# Patient Record
Sex: Female | Born: 1935
Health system: Southern US, Community
[De-identification: ages and names within clinical notes are randomized; demographics above are authoritative.]

## PROBLEM LIST (undated history)

## (undated) DIAGNOSIS — M1990 Unspecified osteoarthritis, unspecified site: Secondary | ICD-10-CM

## (undated) DIAGNOSIS — L570 Actinic keratosis: Secondary | ICD-10-CM

## (undated) DIAGNOSIS — R410 Disorientation, unspecified: Secondary | ICD-10-CM

## (undated) DIAGNOSIS — I10 Essential (primary) hypertension: Secondary | ICD-10-CM

## (undated) DIAGNOSIS — J439 Emphysema, unspecified: Secondary | ICD-10-CM

## (undated) DIAGNOSIS — R0989 Other specified symptoms and signs involving the circulatory and respiratory systems: Secondary | ICD-10-CM

## (undated) DIAGNOSIS — R413 Other amnesia: Secondary | ICD-10-CM

## (undated) DIAGNOSIS — H409 Unspecified glaucoma: Secondary | ICD-10-CM

## (undated) DIAGNOSIS — E039 Hypothyroidism, unspecified: Secondary | ICD-10-CM

## (undated) DIAGNOSIS — E871 Hypo-osmolality and hyponatremia: Secondary | ICD-10-CM

## (undated) DIAGNOSIS — R251 Tremor, unspecified: Secondary | ICD-10-CM

## (undated) HISTORY — DX: Disorientation, unspecified: R41.0

## (undated) HISTORY — DX: Other amnesia: R41.3

## (undated) HISTORY — PX: BREAST BIOPSY: SHX20

## (undated) HISTORY — DX: Hypo-osmolality and hyponatremia: E87.1

## (undated) HISTORY — DX: Unspecified glaucoma: H40.9

## (undated) HISTORY — DX: Hypothyroidism, unspecified: E03.9

## (undated) HISTORY — DX: Essential (primary) hypertension: I10

## (undated) HISTORY — DX: Emphysema, unspecified: J43.9

## (undated) HISTORY — DX: Tremor, unspecified: R25.1

## (undated) HISTORY — DX: Other specified symptoms and signs involving the circulatory and respiratory systems: R09.89

## (undated) HISTORY — DX: Actinic keratosis: L57.0

## (undated) HISTORY — DX: Unspecified osteoarthritis, unspecified site: M19.90

---

## 2014-07-21 DIAGNOSIS — I10 Essential (primary) hypertension: Secondary | ICD-10-CM | POA: Diagnosis not present

## 2014-07-21 DIAGNOSIS — E039 Hypothyroidism, unspecified: Secondary | ICD-10-CM | POA: Diagnosis not present

## 2014-07-21 DIAGNOSIS — E785 Hyperlipidemia, unspecified: Secondary | ICD-10-CM | POA: Diagnosis not present

## 2014-07-21 DIAGNOSIS — E559 Vitamin D deficiency, unspecified: Secondary | ICD-10-CM | POA: Diagnosis not present

## 2014-09-08 DIAGNOSIS — H4011X0 Primary open-angle glaucoma, stage unspecified: Secondary | ICD-10-CM | POA: Diagnosis not present

## 2014-11-24 DIAGNOSIS — E559 Vitamin D deficiency, unspecified: Secondary | ICD-10-CM | POA: Diagnosis not present

## 2014-11-24 DIAGNOSIS — I10 Essential (primary) hypertension: Secondary | ICD-10-CM | POA: Diagnosis not present

## 2014-11-24 DIAGNOSIS — E785 Hyperlipidemia, unspecified: Secondary | ICD-10-CM | POA: Diagnosis not present

## 2014-12-01 DIAGNOSIS — E559 Vitamin D deficiency, unspecified: Secondary | ICD-10-CM | POA: Diagnosis not present

## 2014-12-01 DIAGNOSIS — D649 Anemia, unspecified: Secondary | ICD-10-CM | POA: Diagnosis not present

## 2014-12-01 DIAGNOSIS — L989 Disorder of the skin and subcutaneous tissue, unspecified: Secondary | ICD-10-CM | POA: Diagnosis not present

## 2014-12-01 DIAGNOSIS — E784 Other hyperlipidemia: Secondary | ICD-10-CM | POA: Diagnosis not present

## 2014-12-01 DIAGNOSIS — D539 Nutritional anemia, unspecified: Secondary | ICD-10-CM | POA: Diagnosis not present

## 2014-12-01 DIAGNOSIS — I10 Essential (primary) hypertension: Secondary | ICD-10-CM | POA: Diagnosis not present

## 2014-12-18 DIAGNOSIS — D485 Neoplasm of uncertain behavior of skin: Secondary | ICD-10-CM | POA: Diagnosis not present

## 2014-12-18 DIAGNOSIS — C44629 Squamous cell carcinoma of skin of left upper limb, including shoulder: Secondary | ICD-10-CM | POA: Diagnosis not present

## 2014-12-18 DIAGNOSIS — C44329 Squamous cell carcinoma of skin of other parts of face: Secondary | ICD-10-CM | POA: Diagnosis not present

## 2014-12-18 DIAGNOSIS — D692 Other nonthrombocytopenic purpura: Secondary | ICD-10-CM | POA: Diagnosis not present

## 2014-12-18 DIAGNOSIS — L570 Actinic keratosis: Secondary | ICD-10-CM | POA: Diagnosis not present

## 2015-01-04 DIAGNOSIS — Z1231 Encounter for screening mammogram for malignant neoplasm of breast: Secondary | ICD-10-CM | POA: Diagnosis not present

## 2015-01-04 DIAGNOSIS — Z9289 Personal history of other medical treatment: Secondary | ICD-10-CM | POA: Diagnosis not present

## 2015-01-04 DIAGNOSIS — D649 Anemia, unspecified: Secondary | ICD-10-CM | POA: Diagnosis not present

## 2015-01-27 DIAGNOSIS — C44329 Squamous cell carcinoma of skin of other parts of face: Secondary | ICD-10-CM | POA: Diagnosis not present

## 2015-02-02 DIAGNOSIS — S37019D Minor contusion of unspecified kidney, subsequent encounter: Secondary | ICD-10-CM | POA: Diagnosis not present

## 2015-02-02 DIAGNOSIS — N390 Urinary tract infection, site not specified: Secondary | ICD-10-CM | POA: Diagnosis not present

## 2015-02-02 DIAGNOSIS — N189 Chronic kidney disease, unspecified: Secondary | ICD-10-CM | POA: Diagnosis not present

## 2015-02-02 DIAGNOSIS — N183 Chronic kidney disease, stage 3 (moderate): Secondary | ICD-10-CM | POA: Diagnosis not present

## 2015-02-02 DIAGNOSIS — N2889 Other specified disorders of kidney and ureter: Secondary | ICD-10-CM | POA: Diagnosis not present

## 2015-02-09 DIAGNOSIS — C44629 Squamous cell carcinoma of skin of left upper limb, including shoulder: Secondary | ICD-10-CM | POA: Diagnosis not present

## 2015-02-15 DIAGNOSIS — C44629 Squamous cell carcinoma of skin of left upper limb, including shoulder: Secondary | ICD-10-CM | POA: Diagnosis not present

## 2015-02-15 DIAGNOSIS — D0462 Carcinoma in situ of skin of left upper limb, including shoulder: Secondary | ICD-10-CM | POA: Diagnosis not present

## 2015-02-24 DIAGNOSIS — C44329 Squamous cell carcinoma of skin of other parts of face: Secondary | ICD-10-CM | POA: Diagnosis not present

## 2015-03-11 DIAGNOSIS — H2510 Age-related nuclear cataract, unspecified eye: Secondary | ICD-10-CM | POA: Diagnosis not present

## 2015-04-12 DIAGNOSIS — Z23 Encounter for immunization: Secondary | ICD-10-CM | POA: Diagnosis not present

## 2015-04-12 DIAGNOSIS — E559 Vitamin D deficiency, unspecified: Secondary | ICD-10-CM | POA: Diagnosis not present

## 2015-04-12 DIAGNOSIS — E039 Hypothyroidism, unspecified: Secondary | ICD-10-CM | POA: Diagnosis not present

## 2015-04-12 DIAGNOSIS — I10 Essential (primary) hypertension: Secondary | ICD-10-CM | POA: Diagnosis not present

## 2015-04-12 DIAGNOSIS — E785 Hyperlipidemia, unspecified: Secondary | ICD-10-CM | POA: Diagnosis not present

## 2015-04-27 DIAGNOSIS — E871 Hypo-osmolality and hyponatremia: Secondary | ICD-10-CM | POA: Diagnosis not present

## 2015-05-21 DIAGNOSIS — S29012A Strain of muscle and tendon of back wall of thorax, initial encounter: Secondary | ICD-10-CM | POA: Diagnosis not present

## 2015-05-21 DIAGNOSIS — S39012A Strain of muscle, fascia and tendon of lower back, initial encounter: Secondary | ICD-10-CM | POA: Diagnosis not present

## 2015-05-21 DIAGNOSIS — S336XXA Sprain of sacroiliac joint, initial encounter: Secondary | ICD-10-CM | POA: Diagnosis not present

## 2015-05-21 DIAGNOSIS — M9903 Segmental and somatic dysfunction of lumbar region: Secondary | ICD-10-CM | POA: Diagnosis not present

## 2015-05-24 DIAGNOSIS — S336XXA Sprain of sacroiliac joint, initial encounter: Secondary | ICD-10-CM | POA: Diagnosis not present

## 2015-05-24 DIAGNOSIS — S29012A Strain of muscle and tendon of back wall of thorax, initial encounter: Secondary | ICD-10-CM | POA: Diagnosis not present

## 2015-05-24 DIAGNOSIS — M9903 Segmental and somatic dysfunction of lumbar region: Secondary | ICD-10-CM | POA: Diagnosis not present

## 2015-05-24 DIAGNOSIS — S39012A Strain of muscle, fascia and tendon of lower back, initial encounter: Secondary | ICD-10-CM | POA: Diagnosis not present

## 2015-05-26 DIAGNOSIS — S39012A Strain of muscle, fascia and tendon of lower back, initial encounter: Secondary | ICD-10-CM | POA: Diagnosis not present

## 2015-05-26 DIAGNOSIS — S29012A Strain of muscle and tendon of back wall of thorax, initial encounter: Secondary | ICD-10-CM | POA: Diagnosis not present

## 2015-05-26 DIAGNOSIS — S336XXA Sprain of sacroiliac joint, initial encounter: Secondary | ICD-10-CM | POA: Diagnosis not present

## 2015-05-26 DIAGNOSIS — M9903 Segmental and somatic dysfunction of lumbar region: Secondary | ICD-10-CM | POA: Diagnosis not present

## 2015-05-28 DIAGNOSIS — S39012A Strain of muscle, fascia and tendon of lower back, initial encounter: Secondary | ICD-10-CM | POA: Diagnosis not present

## 2015-05-28 DIAGNOSIS — S29012A Strain of muscle and tendon of back wall of thorax, initial encounter: Secondary | ICD-10-CM | POA: Diagnosis not present

## 2015-05-28 DIAGNOSIS — M9903 Segmental and somatic dysfunction of lumbar region: Secondary | ICD-10-CM | POA: Diagnosis not present

## 2015-05-28 DIAGNOSIS — S336XXA Sprain of sacroiliac joint, initial encounter: Secondary | ICD-10-CM | POA: Diagnosis not present

## 2015-05-29 DIAGNOSIS — E871 Hypo-osmolality and hyponatremia: Secondary | ICD-10-CM | POA: Diagnosis not present

## 2015-05-29 DIAGNOSIS — B9620 Unspecified Escherichia coli [E. coli] as the cause of diseases classified elsewhere: Secondary | ICD-10-CM | POA: Diagnosis not present

## 2015-05-29 DIAGNOSIS — S0990XA Unspecified injury of head, initial encounter: Secondary | ICD-10-CM | POA: Diagnosis not present

## 2015-05-29 DIAGNOSIS — F0150 Vascular dementia without behavioral disturbance: Secondary | ICD-10-CM | POA: Diagnosis not present

## 2015-05-29 DIAGNOSIS — R296 Repeated falls: Secondary | ICD-10-CM | POA: Diagnosis not present

## 2015-05-29 DIAGNOSIS — F0390 Unspecified dementia without behavioral disturbance: Secondary | ICD-10-CM | POA: Diagnosis present

## 2015-05-29 DIAGNOSIS — E039 Hypothyroidism, unspecified: Secondary | ICD-10-CM | POA: Diagnosis not present

## 2015-05-29 DIAGNOSIS — R451 Restlessness and agitation: Secondary | ICD-10-CM | POA: Diagnosis not present

## 2015-05-29 DIAGNOSIS — S79912A Unspecified injury of left hip, initial encounter: Secondary | ICD-10-CM | POA: Diagnosis not present

## 2015-05-29 DIAGNOSIS — F0280 Dementia in other diseases classified elsewhere without behavioral disturbance: Secondary | ICD-10-CM | POA: Diagnosis not present

## 2015-05-29 DIAGNOSIS — R278 Other lack of coordination: Secondary | ICD-10-CM | POA: Diagnosis not present

## 2015-05-29 DIAGNOSIS — R54 Age-related physical debility: Secondary | ICD-10-CM | POA: Diagnosis not present

## 2015-05-29 DIAGNOSIS — I110 Hypertensive heart disease with heart failure: Secondary | ICD-10-CM | POA: Diagnosis not present

## 2015-05-29 DIAGNOSIS — S79911A Unspecified injury of right hip, initial encounter: Secondary | ICD-10-CM | POA: Diagnosis not present

## 2015-05-29 DIAGNOSIS — N390 Urinary tract infection, site not specified: Secondary | ICD-10-CM | POA: Diagnosis not present

## 2015-05-29 DIAGNOSIS — R5381 Other malaise: Secondary | ICD-10-CM | POA: Diagnosis not present

## 2015-05-29 DIAGNOSIS — S3993XA Unspecified injury of pelvis, initial encounter: Secondary | ICD-10-CM | POA: Diagnosis not present

## 2015-05-29 DIAGNOSIS — M79606 Pain in leg, unspecified: Secondary | ICD-10-CM | POA: Diagnosis not present

## 2015-05-29 DIAGNOSIS — M6281 Muscle weakness (generalized): Secondary | ICD-10-CM | POA: Diagnosis not present

## 2015-05-29 DIAGNOSIS — T148 Other injury of unspecified body region: Secondary | ICD-10-CM | POA: Diagnosis not present

## 2015-05-29 DIAGNOSIS — I10 Essential (primary) hypertension: Secondary | ICD-10-CM | POA: Diagnosis not present

## 2015-06-02 DIAGNOSIS — R5381 Other malaise: Secondary | ICD-10-CM | POA: Diagnosis not present

## 2015-06-02 DIAGNOSIS — S79911A Unspecified injury of right hip, initial encounter: Secondary | ICD-10-CM | POA: Diagnosis not present

## 2015-06-02 DIAGNOSIS — F0390 Unspecified dementia without behavioral disturbance: Secondary | ICD-10-CM | POA: Diagnosis not present

## 2015-06-02 DIAGNOSIS — R296 Repeated falls: Secondary | ICD-10-CM | POA: Diagnosis not present

## 2015-06-02 DIAGNOSIS — N390 Urinary tract infection, site not specified: Secondary | ICD-10-CM | POA: Diagnosis not present

## 2015-06-02 DIAGNOSIS — S79912A Unspecified injury of left hip, initial encounter: Secondary | ICD-10-CM | POA: Diagnosis not present

## 2015-06-02 DIAGNOSIS — R451 Restlessness and agitation: Secondary | ICD-10-CM | POA: Diagnosis not present

## 2015-06-02 DIAGNOSIS — B9620 Unspecified Escherichia coli [E. coli] as the cause of diseases classified elsewhere: Secondary | ICD-10-CM | POA: Diagnosis not present

## 2015-06-02 DIAGNOSIS — R278 Other lack of coordination: Secondary | ICD-10-CM | POA: Diagnosis not present

## 2015-06-02 DIAGNOSIS — E871 Hypo-osmolality and hyponatremia: Secondary | ICD-10-CM | POA: Diagnosis not present

## 2015-06-02 DIAGNOSIS — M6281 Muscle weakness (generalized): Secondary | ICD-10-CM | POA: Diagnosis not present

## 2015-06-02 DIAGNOSIS — R54 Age-related physical debility: Secondary | ICD-10-CM | POA: Diagnosis not present

## 2015-06-02 DIAGNOSIS — I10 Essential (primary) hypertension: Secondary | ICD-10-CM | POA: Diagnosis not present

## 2015-06-02 DIAGNOSIS — F0280 Dementia in other diseases classified elsewhere without behavioral disturbance: Secondary | ICD-10-CM | POA: Diagnosis not present

## 2015-06-02 DIAGNOSIS — E039 Hypothyroidism, unspecified: Secondary | ICD-10-CM | POA: Diagnosis not present

## 2015-06-07 DIAGNOSIS — N390 Urinary tract infection, site not specified: Secondary | ICD-10-CM | POA: Diagnosis not present

## 2015-06-07 DIAGNOSIS — I10 Essential (primary) hypertension: Secondary | ICD-10-CM | POA: Diagnosis not present

## 2015-06-07 DIAGNOSIS — E871 Hypo-osmolality and hyponatremia: Secondary | ICD-10-CM | POA: Diagnosis not present

## 2015-06-07 DIAGNOSIS — R5381 Other malaise: Secondary | ICD-10-CM | POA: Diagnosis not present

## 2015-06-20 DIAGNOSIS — I10 Essential (primary) hypertension: Secondary | ICD-10-CM | POA: Diagnosis not present

## 2015-06-20 DIAGNOSIS — E871 Hypo-osmolality and hyponatremia: Secondary | ICD-10-CM | POA: Diagnosis not present

## 2015-06-20 DIAGNOSIS — E039 Hypothyroidism, unspecified: Secondary | ICD-10-CM | POA: Diagnosis not present

## 2015-06-20 DIAGNOSIS — M6281 Muscle weakness (generalized): Secondary | ICD-10-CM | POA: Diagnosis not present

## 2015-06-20 DIAGNOSIS — N390 Urinary tract infection, site not specified: Secondary | ICD-10-CM | POA: Diagnosis not present

## 2015-06-20 DIAGNOSIS — R54 Age-related physical debility: Secondary | ICD-10-CM | POA: Diagnosis not present

## 2015-06-20 DIAGNOSIS — R5381 Other malaise: Secondary | ICD-10-CM | POA: Diagnosis not present

## 2015-06-20 DIAGNOSIS — R451 Restlessness and agitation: Secondary | ICD-10-CM | POA: Diagnosis not present

## 2015-06-20 DIAGNOSIS — R296 Repeated falls: Secondary | ICD-10-CM | POA: Diagnosis not present

## 2015-06-20 DIAGNOSIS — F0390 Unspecified dementia without behavioral disturbance: Secondary | ICD-10-CM | POA: Diagnosis not present

## 2015-06-20 DIAGNOSIS — R278 Other lack of coordination: Secondary | ICD-10-CM | POA: Diagnosis not present

## 2015-06-20 DIAGNOSIS — F0280 Dementia in other diseases classified elsewhere without behavioral disturbance: Secondary | ICD-10-CM | POA: Diagnosis not present

## 2015-06-23 DIAGNOSIS — F0390 Unspecified dementia without behavioral disturbance: Secondary | ICD-10-CM | POA: Diagnosis not present

## 2015-06-23 DIAGNOSIS — M6281 Muscle weakness (generalized): Secondary | ICD-10-CM | POA: Diagnosis not present

## 2015-06-23 DIAGNOSIS — I10 Essential (primary) hypertension: Secondary | ICD-10-CM | POA: Diagnosis not present

## 2015-06-23 DIAGNOSIS — Z9181 History of falling: Secondary | ICD-10-CM | POA: Diagnosis not present

## 2015-06-23 DIAGNOSIS — Z87440 Personal history of urinary (tract) infections: Secondary | ICD-10-CM | POA: Diagnosis not present

## 2015-06-23 DIAGNOSIS — S90922D Unspecified superficial injury of left foot, subsequent encounter: Secondary | ICD-10-CM | POA: Diagnosis not present

## 2015-06-24 ENCOUNTER — Telehealth: Payer: Self-pay | Admitting: Primary Care

## 2015-06-24 ENCOUNTER — Ambulatory Visit (INDEPENDENT_AMBULATORY_CARE_PROVIDER_SITE_OTHER): Payer: Medicare Other | Admitting: Primary Care

## 2015-06-24 ENCOUNTER — Encounter: Payer: Self-pay | Admitting: Primary Care

## 2015-06-24 VITALS — BP 136/76 | HR 81 | Temp 98.0°F | Ht 59.75 in | Wt 107.0 lb

## 2015-06-24 DIAGNOSIS — F0390 Unspecified dementia without behavioral disturbance: Secondary | ICD-10-CM | POA: Insufficient documentation

## 2015-06-24 DIAGNOSIS — E039 Hypothyroidism, unspecified: Secondary | ICD-10-CM

## 2015-06-24 DIAGNOSIS — R0989 Other specified symptoms and signs involving the circulatory and respiratory systems: Secondary | ICD-10-CM

## 2015-06-24 DIAGNOSIS — R251 Tremor, unspecified: Secondary | ICD-10-CM

## 2015-06-24 DIAGNOSIS — R410 Disorientation, unspecified: Secondary | ICD-10-CM

## 2015-06-24 DIAGNOSIS — I10 Essential (primary) hypertension: Secondary | ICD-10-CM | POA: Insufficient documentation

## 2015-06-24 DIAGNOSIS — I739 Peripheral vascular disease, unspecified: Secondary | ICD-10-CM

## 2015-06-24 DIAGNOSIS — Z87440 Personal history of urinary (tract) infections: Secondary | ICD-10-CM | POA: Diagnosis not present

## 2015-06-24 DIAGNOSIS — H409 Unspecified glaucoma: Secondary | ICD-10-CM

## 2015-06-24 LAB — URINALYSIS, ROUTINE W REFLEX MICROSCOPIC
BILIRUBIN URINE: NEGATIVE
HGB URINE DIPSTICK: NEGATIVE
Ketones, ur: NEGATIVE
LEUKOCYTES UA: NEGATIVE
NITRITE: NEGATIVE
Specific Gravity, Urine: 1.015 (ref 1.000–1.030)
Total Protein, Urine: 100 — AB
Urine Glucose: NEGATIVE
Urobilinogen, UA: 0.2 (ref 0.0–1.0)
pH: 7.5 (ref 5.0–8.0)

## 2015-06-24 LAB — COMPREHENSIVE METABOLIC PANEL
ALK PHOS: 94 U/L (ref 39–117)
ALT: 10 U/L (ref 0–35)
AST: 17 U/L (ref 0–37)
Albumin: 3.2 g/dL — ABNORMAL LOW (ref 3.5–5.2)
BUN: 16 mg/dL (ref 6–23)
CHLORIDE: 100 meq/L (ref 96–112)
CO2: 28 mEq/L (ref 19–32)
CREATININE: 0.85 mg/dL (ref 0.40–1.20)
Calcium: 9.3 mg/dL (ref 8.4–10.5)
GFR: 66.81 mL/min (ref >60.00)
GLUCOSE: 92 mg/dL (ref 70–99)
POTASSIUM: 4.4 meq/L (ref 3.5–5.1)
SODIUM: 136 meq/L (ref 135–145)
TOTAL PROTEIN: 7 g/dL (ref 6.0–8.3)
Total Bilirubin: 0.5 mg/dL (ref 0.2–1.2)

## 2015-06-24 LAB — TSH: TSH: 2.51 u[IU]/mL (ref 0.35–4.50)

## 2015-06-24 NOTE — Telephone Encounter (Signed)
It appears that Nicole Downs is taking both lisinopril and benazepril which are contraindicated together. Will you please call and get full med rec please?

## 2015-06-24 NOTE — Assessment & Plan Note (Signed)
Bluish/purple discoloration to bilateral feet representing poor circulation. Feet warm, mild coolness to toes. No swelling or tenderness. Good pulses, 2+ DP and PT. Suspect poor circulation due to position in wheelchair. Do no suspect clot and will continue to monitor. Educated patient and daughter to elevate her legs in a recliner to help with circulation.

## 2015-06-24 NOTE — Progress Notes (Signed)
Pre visit review using our clinic review tool, if applicable. No additional management support is needed unless otherwise documented below in the visit note. 

## 2015-06-24 NOTE — Assessment & Plan Note (Signed)
Currently managed on levothryoxine 25 mcg. TSH today WNL. Continue current regimen.

## 2015-06-24 NOTE — Assessment & Plan Note (Signed)
Acute confusion during recent UTI and during recent hospitalization. Aricept was initiated in hospital by psych. Daughter does notice some odd behavior. Patient was living on her own, but as of recent has moved in with her daughter who is assuming care. Will continue to monitor.

## 2015-06-24 NOTE — Patient Instructions (Addendum)
Complete lab work prior to leaving today. I will notify you of your results once received.   I will obtain records for your previous doctor and recent hospitalization.  I will be in contact with you soon regarding testing for your feet. Please ensure that you are elevating your feet in a recliner when not walking.  It was a pleasure to meet you today! Please don't hesitate to call me with any questions. Welcome to [facility]!

## 2015-06-24 NOTE — Assessment & Plan Note (Signed)
Record reads she is taking lisinopril and benazepril which i believe is a typo. Will call patient to get correct regimen. If taking both will have her hold one of the Ace's. Also managed on amlodipine. Stable BP today. Continue current regimen.

## 2015-06-24 NOTE — Progress Notes (Signed)
Subjective:    Patient ID: Nicole Downs, female    DOB: [birth year], 82 y.o.   MRN: [id number]  HPI  Nicole Downs is an 82 year old female who presents today to establish care and discuss the problems mentioned below. Will obtain old records. Her last physical is unknown.   1) Essential Hypertension: Currently managed on amlodipine 10 mg, benazepril 20 mg, and lisinopril 20 mg?. Her daughter has recently been checking her blood pressure at home and reports "normal" readings. Denies chest pain, headaches, dizziness.  2) Confusion/Dementia: She was admitted to the hospital in mid December for electrolyte imbalance and UTI. Currently managed on donepezil 5 mg tablets that were initiated in the hospital. Daughter is unsure as to why the patient is on aricept, but does endorse odd behavoir from her mother since she's moved in 3 days ago. She was evaluated by a psychiatrist while in the hospital as she was confused during her UTI. She recently moved in with her daughter Tuesday this week.   3) Hypothyroidism: Diagnosed years ago. Currently managed on levothyroxine 25 mcg. TSH normal from today.  4) Foot Discoloration: Discoloration to bilateral feet for the past 2 days. She was in [facility] for the previous 20 days for rehab and it's possible her discoloration has been present for longer. The patient denies numbness/tingling, pain. She does have 2 well healing sores present to left dorsal foot. Home health RN is out daily to check on wounds. She is mostly wheelchair bound and sits in her wheelchair most of the day.     Review of Systems  Constitutional: Negative for unexpected weight change.  HENT: Positive for rhinorrhea. Negative for ear pain.   Respiratory: Negative for cough and shortness of breath.   Cardiovascular: Negative for chest pain.  Gastrointestinal:       Occasional constipation  Genitourinary: Negative for difficulty urinating.  Musculoskeletal: Negative for  myalgias and arthralgias.       Arthritis to hands and right lower extremity. Xrays completed during recent hospitalization.   Skin: Positive for color change.  Allergic/Immunologic: Positive for environmental allergies.  Neurological: Negative for dizziness, numbness and headaches.  Psychiatric/Behavioral:       Could be newly diagnosed dementia, initiated on aricept in hospital in December.        Past Medical History  Diagnosis Date  . Arthritis   . Glaucoma   . Hypertension   . Thyroid disease     Social History   Social History  . Marital Status: Single    Spouse Name: N/A  . Number of Children: N/A  . Years of Education: N/A   Occupational History  . Not on file.   Social History Main Topics  . Smoking status: Never Smoker   . Smokeless tobacco: Not on file  . Alcohol Use: No  . Drug Use: Not on file  . Sexual Activity: Not on file   Other Topics Concern  . Not on file   Social History Narrative   Lives with daughter, recent.       Past Surgical History  Procedure Laterality Date  . Breast biopsy      History reviewed. No pertinent family history.  No Known Allergies  No current outpatient prescriptions on file prior to visit.   No current facility-administered medications on file prior to visit.    BP 136/76 mmHg  Pulse 81  Temp(Src) 98 F (36.7 C) (Oral)  Ht 4' 11.75" (1.518 m)  Wt  107 lb (48.535 kg)  BMI 21.06 kg/m2  SpO2 99%    Objective:   Physical Exam  Constitutional: She is oriented to person, place, and time. She appears well-nourished.  Cardiovascular: Normal rate and regular rhythm.   Pulmonary/Chest: Effort normal and breath sounds normal. She has no rales.  Neurological: She is alert and oriented to person, place, and time.  Skin: Skin is warm and dry.  Bluish/purple discoloration to bilateral lower feet. 2+ DP and PT pulses. No swelling.  2 well healing 1/2 cm sores to dorsal foot. No s/s of infection.  Psychiatric:  She has a normal mood and affect.  Doesn't appear confused.          Assessment & Plan:

## 2015-06-25 ENCOUNTER — Encounter: Payer: Self-pay | Admitting: *Deleted

## 2015-06-25 ENCOUNTER — Telehealth: Payer: Self-pay | Admitting: Primary Care

## 2015-06-25 DIAGNOSIS — M6281 Muscle weakness (generalized): Secondary | ICD-10-CM | POA: Diagnosis not present

## 2015-06-25 DIAGNOSIS — S90922D Unspecified superficial injury of left foot, subsequent encounter: Secondary | ICD-10-CM | POA: Diagnosis not present

## 2015-06-25 DIAGNOSIS — Z87440 Personal history of urinary (tract) infections: Secondary | ICD-10-CM | POA: Diagnosis not present

## 2015-06-25 DIAGNOSIS — F0390 Unspecified dementia without behavioral disturbance: Secondary | ICD-10-CM | POA: Diagnosis not present

## 2015-06-25 DIAGNOSIS — I10 Essential (primary) hypertension: Secondary | ICD-10-CM | POA: Diagnosis not present

## 2015-06-25 DIAGNOSIS — Z9181 History of falling: Secondary | ICD-10-CM | POA: Diagnosis not present

## 2015-06-25 MED ORDER — AMLODIPINE BESYLATE 10 MG PO TABS: 10.0000 mg | ORAL_TABLET | Freq: Every day | ORAL | Status: DC

## 2015-06-25 MED ORDER — DONEPEZIL HCL 5 MG PO TABS: 5.0000 mg | ORAL_TABLET | Freq: Every day | ORAL | Status: DC

## 2015-06-25 MED ORDER — LISINOPRIL 20 MG PO TABS: 20.0000 mg | ORAL_TABLET | Freq: Every day | ORAL | Status: DC

## 2015-06-25 MED ORDER — PRIMIDONE 50 MG PO TABS: 50.0000 mg | ORAL_TABLET | Freq: Two times a day (BID) | ORAL | Status: DC

## 2015-06-25 MED ORDER — LEVOBUNOLOL HCL 0.5 % OP SOLN: 1.0000 [drp] | Freq: Two times a day (BID) | OPHTHALMIC | Status: DC

## 2015-06-25 MED ORDER — LEVOTHYROXINE SODIUM 25 MCG PO TABS: 25.0000 ug | ORAL_TABLET | Freq: Every day | ORAL | Status: DC

## 2015-06-25 NOTE — Addendum Note (Signed)
Addended by: Jacqualin Combes on: 06/25/2015 02:08 PM   Modules accepted: Orders, Medications

## 2015-06-25 NOTE — Telephone Encounter (Signed)
Done

## 2015-06-25 NOTE — Telephone Encounter (Signed)
Please correct this in her chart. Refills okay. 90 day supply 1 refill.

## 2015-06-25 NOTE — Telephone Encounter (Signed)
Called and spoken to patient's daughter [name]). Notified her of Kate's comments. Patient's daughter stated that patient was taking benazepril then was changed to lisinopril. Currently taking lisinopril only.  Also request to have all the medications to be refill.

## 2015-06-28 ENCOUNTER — Encounter: Payer: Self-pay | Admitting: Primary Care

## 2015-06-28 ENCOUNTER — Telehealth: Payer: Self-pay

## 2015-06-28 ENCOUNTER — Telehealth: Payer: Self-pay | Admitting: Primary Care

## 2015-06-28 NOTE — Telephone Encounter (Signed)
Approved.  Please call Marlowe Kays with OT for home health that I've approved twice weekly for 6 weeks as requested.

## 2015-06-28 NOTE — Telephone Encounter (Signed)
Marlowe Kays OT with [facility] left v/m requesting verbal orders for home health OT 2 x a week for 6 weeks.

## 2015-06-28 NOTE — Telephone Encounter (Signed)
Received records from hospitalization at [facility]. I would also like records from her nursing home stay at [facility], can you please get these?  1. Please verify all meds again. From the records i received she is to be on benazepril. Doesn't matter, I just want to know which one for sure. 2. How are her feet? Any improvement in color?

## 2015-06-28 NOTE — Telephone Encounter (Signed)
[place] notified. Thanks!

## 2015-06-29 ENCOUNTER — Telehealth: Payer: Self-pay | Admitting: Primary Care

## 2015-06-29 DIAGNOSIS — F0390 Unspecified dementia without behavioral disturbance: Secondary | ICD-10-CM | POA: Diagnosis not present

## 2015-06-29 DIAGNOSIS — S90922D Unspecified superficial injury of left foot, subsequent encounter: Secondary | ICD-10-CM | POA: Diagnosis not present

## 2015-06-29 DIAGNOSIS — R0989 Other specified symptoms and signs involving the circulatory and respiratory systems: Secondary | ICD-10-CM

## 2015-06-29 DIAGNOSIS — Z87440 Personal history of urinary (tract) infections: Secondary | ICD-10-CM | POA: Diagnosis not present

## 2015-06-29 DIAGNOSIS — M6281 Muscle weakness (generalized): Secondary | ICD-10-CM | POA: Diagnosis not present

## 2015-06-29 DIAGNOSIS — Z9181 History of falling: Secondary | ICD-10-CM | POA: Diagnosis not present

## 2015-06-29 DIAGNOSIS — I10 Essential (primary) hypertension: Secondary | ICD-10-CM | POA: Diagnosis not present

## 2015-06-29 NOTE — Telephone Encounter (Signed)
I'd like to send her for evaluation of her feet since they've really not improved in color. I've [facility], so she should be hearing about this soon. Yes, continue the aricept medication for presumed dementia at this time. I didn't get much from psychiatry from the hospital records, which is why i am wanting records from [facility].

## 2015-06-29 NOTE — Telephone Encounter (Signed)
Called and spoken to patient's daughter.  She stated that the medication was change in the nursing home and she stated that it does not matter. Patient is doing well on lisinopril. Patient's daughter stated that yesterday the feet looks better. Today, the left foot look extremely blue and right foot not as bad as left but still blue. Fells cold to the touch and always wearing thick socks.  Also patient's daughter asked if patient should be taking the medication for confusion after looking at the hospital records.

## 2015-07-01 DIAGNOSIS — M6281 Muscle weakness (generalized): Secondary | ICD-10-CM | POA: Diagnosis not present

## 2015-07-01 DIAGNOSIS — F0390 Unspecified dementia without behavioral disturbance: Secondary | ICD-10-CM | POA: Diagnosis not present

## 2015-07-01 DIAGNOSIS — Z9181 History of falling: Secondary | ICD-10-CM | POA: Diagnosis not present

## 2015-07-01 DIAGNOSIS — I10 Essential (primary) hypertension: Secondary | ICD-10-CM | POA: Diagnosis not present

## 2015-07-01 DIAGNOSIS — S90922D Unspecified superficial injury of left foot, subsequent encounter: Secondary | ICD-10-CM | POA: Diagnosis not present

## 2015-07-01 DIAGNOSIS — Z87440 Personal history of urinary (tract) infections: Secondary | ICD-10-CM | POA: Diagnosis not present

## 2015-07-02 DIAGNOSIS — N390 Urinary tract infection, site not specified: Secondary | ICD-10-CM | POA: Diagnosis not present

## 2015-07-02 DIAGNOSIS — I10 Essential (primary) hypertension: Secondary | ICD-10-CM | POA: Diagnosis not present

## 2015-07-02 DIAGNOSIS — E871 Hypo-osmolality and hyponatremia: Secondary | ICD-10-CM | POA: Diagnosis not present

## 2015-07-02 DIAGNOSIS — R5381 Other malaise: Secondary | ICD-10-CM | POA: Diagnosis not present

## 2015-07-03 DIAGNOSIS — Z87440 Personal history of urinary (tract) infections: Secondary | ICD-10-CM | POA: Diagnosis not present

## 2015-07-03 DIAGNOSIS — Z9181 History of falling: Secondary | ICD-10-CM | POA: Diagnosis not present

## 2015-07-03 DIAGNOSIS — M6281 Muscle weakness (generalized): Secondary | ICD-10-CM | POA: Diagnosis not present

## 2015-07-03 DIAGNOSIS — S90922D Unspecified superficial injury of left foot, subsequent encounter: Secondary | ICD-10-CM | POA: Diagnosis not present

## 2015-07-03 DIAGNOSIS — F0390 Unspecified dementia without behavioral disturbance: Secondary | ICD-10-CM | POA: Diagnosis not present

## 2015-07-03 DIAGNOSIS — I10 Essential (primary) hypertension: Secondary | ICD-10-CM | POA: Diagnosis not present

## 2015-07-06 DIAGNOSIS — F0390 Unspecified dementia without behavioral disturbance: Secondary | ICD-10-CM | POA: Diagnosis not present

## 2015-07-06 DIAGNOSIS — Z9181 History of falling: Secondary | ICD-10-CM | POA: Diagnosis not present

## 2015-07-06 DIAGNOSIS — S90922D Unspecified superficial injury of left foot, subsequent encounter: Secondary | ICD-10-CM | POA: Diagnosis not present

## 2015-07-06 DIAGNOSIS — Z87440 Personal history of urinary (tract) infections: Secondary | ICD-10-CM | POA: Diagnosis not present

## 2015-07-06 DIAGNOSIS — M6281 Muscle weakness (generalized): Secondary | ICD-10-CM | POA: Diagnosis not present

## 2015-07-06 DIAGNOSIS — I10 Essential (primary) hypertension: Secondary | ICD-10-CM | POA: Diagnosis not present

## 2015-07-08 ENCOUNTER — Other Ambulatory Visit: Payer: Self-pay | Admitting: Primary Care

## 2015-07-08 DIAGNOSIS — Z9181 History of falling: Secondary | ICD-10-CM | POA: Diagnosis not present

## 2015-07-08 DIAGNOSIS — M6281 Muscle weakness (generalized): Secondary | ICD-10-CM | POA: Diagnosis not present

## 2015-07-08 DIAGNOSIS — I10 Essential (primary) hypertension: Secondary | ICD-10-CM | POA: Diagnosis not present

## 2015-07-08 DIAGNOSIS — F0390 Unspecified dementia without behavioral disturbance: Secondary | ICD-10-CM | POA: Diagnosis not present

## 2015-07-08 DIAGNOSIS — R2681 Unsteadiness on feet: Secondary | ICD-10-CM

## 2015-07-08 DIAGNOSIS — Z87440 Personal history of urinary (tract) infections: Secondary | ICD-10-CM | POA: Diagnosis not present

## 2015-07-08 DIAGNOSIS — S90922D Unspecified superficial injury of left foot, subsequent encounter: Secondary | ICD-10-CM | POA: Diagnosis not present

## 2015-07-12 DIAGNOSIS — Z9181 History of falling: Secondary | ICD-10-CM | POA: Diagnosis not present

## 2015-07-12 DIAGNOSIS — S90922D Unspecified superficial injury of left foot, subsequent encounter: Secondary | ICD-10-CM | POA: Diagnosis not present

## 2015-07-12 DIAGNOSIS — F0390 Unspecified dementia without behavioral disturbance: Secondary | ICD-10-CM | POA: Diagnosis not present

## 2015-07-12 DIAGNOSIS — Z87440 Personal history of urinary (tract) infections: Secondary | ICD-10-CM | POA: Diagnosis not present

## 2015-07-12 DIAGNOSIS — I10 Essential (primary) hypertension: Secondary | ICD-10-CM | POA: Diagnosis not present

## 2015-07-12 DIAGNOSIS — M6281 Muscle weakness (generalized): Secondary | ICD-10-CM | POA: Diagnosis not present

## 2015-07-13 DIAGNOSIS — F0390 Unspecified dementia without behavioral disturbance: Secondary | ICD-10-CM | POA: Diagnosis not present

## 2015-07-13 DIAGNOSIS — M6281 Muscle weakness (generalized): Secondary | ICD-10-CM | POA: Diagnosis not present

## 2015-07-13 DIAGNOSIS — Z9181 History of falling: Secondary | ICD-10-CM | POA: Diagnosis not present

## 2015-07-13 DIAGNOSIS — Z87440 Personal history of urinary (tract) infections: Secondary | ICD-10-CM | POA: Diagnosis not present

## 2015-07-13 DIAGNOSIS — I10 Essential (primary) hypertension: Secondary | ICD-10-CM | POA: Diagnosis not present

## 2015-07-13 DIAGNOSIS — S90922D Unspecified superficial injury of left foot, subsequent encounter: Secondary | ICD-10-CM | POA: Diagnosis not present

## 2015-07-15 ENCOUNTER — Telehealth: Payer: Self-pay

## 2015-07-15 DIAGNOSIS — Z9181 History of falling: Secondary | ICD-10-CM | POA: Diagnosis not present

## 2015-07-15 DIAGNOSIS — Z87440 Personal history of urinary (tract) infections: Secondary | ICD-10-CM | POA: Diagnosis not present

## 2015-07-15 DIAGNOSIS — I10 Essential (primary) hypertension: Secondary | ICD-10-CM | POA: Diagnosis not present

## 2015-07-15 DIAGNOSIS — S90922D Unspecified superficial injury of left foot, subsequent encounter: Secondary | ICD-10-CM | POA: Diagnosis not present

## 2015-07-15 DIAGNOSIS — M6281 Muscle weakness (generalized): Secondary | ICD-10-CM | POA: Diagnosis not present

## 2015-07-15 DIAGNOSIS — F0390 Unspecified dementia without behavioral disturbance: Secondary | ICD-10-CM | POA: Diagnosis not present

## 2015-07-15 NOTE — Telephone Encounter (Signed)
Merry Proud nurse with [facility] left v/m requesting verbal orders to collect urine for urinalysis.pt's daughter is concerned that urine is foul smelling. Pt has no frequency, no burning or pain and no confusion and no fever.Please advise.

## 2015-07-15 NOTE — Telephone Encounter (Signed)
Okay to collect. Who will get results? [place]? Home health? Do they have a provider team?

## 2015-07-15 NOTE — Telephone Encounter (Signed)
Called and gave the okay for the verbal order

## 2015-07-16 DIAGNOSIS — E119 Type 2 diabetes mellitus without complications: Secondary | ICD-10-CM | POA: Diagnosis not present

## 2015-07-16 DIAGNOSIS — L97321 Non-pressure chronic ulcer of left ankle limited to breakdown of skin: Secondary | ICD-10-CM | POA: Diagnosis not present

## 2015-07-16 DIAGNOSIS — I10 Essential (primary) hypertension: Secondary | ICD-10-CM | POA: Diagnosis not present

## 2015-07-16 DIAGNOSIS — L97421 Non-pressure chronic ulcer of left heel and midfoot limited to breakdown of skin: Secondary | ICD-10-CM | POA: Diagnosis not present

## 2015-07-16 DIAGNOSIS — L97929 Non-pressure chronic ulcer of unspecified part of left lower leg with unspecified severity: Secondary | ICD-10-CM | POA: Diagnosis not present

## 2015-07-16 DIAGNOSIS — M79609 Pain in unspecified limb: Secondary | ICD-10-CM | POA: Diagnosis not present

## 2015-07-16 DIAGNOSIS — M7989 Other specified soft tissue disorders: Secondary | ICD-10-CM | POA: Diagnosis not present

## 2015-07-19 DIAGNOSIS — M6281 Muscle weakness (generalized): Secondary | ICD-10-CM | POA: Diagnosis not present

## 2015-07-19 DIAGNOSIS — I10 Essential (primary) hypertension: Secondary | ICD-10-CM | POA: Diagnosis not present

## 2015-07-19 DIAGNOSIS — S90922D Unspecified superficial injury of left foot, subsequent encounter: Secondary | ICD-10-CM | POA: Diagnosis not present

## 2015-07-19 DIAGNOSIS — F0390 Unspecified dementia without behavioral disturbance: Secondary | ICD-10-CM | POA: Diagnosis not present

## 2015-07-19 DIAGNOSIS — Z87440 Personal history of urinary (tract) infections: Secondary | ICD-10-CM | POA: Diagnosis not present

## 2015-07-19 DIAGNOSIS — Z9181 History of falling: Secondary | ICD-10-CM | POA: Diagnosis not present

## 2015-07-20 DIAGNOSIS — Z9181 History of falling: Secondary | ICD-10-CM | POA: Diagnosis not present

## 2015-07-20 DIAGNOSIS — M6281 Muscle weakness (generalized): Secondary | ICD-10-CM | POA: Diagnosis not present

## 2015-07-20 DIAGNOSIS — S90922D Unspecified superficial injury of left foot, subsequent encounter: Secondary | ICD-10-CM | POA: Diagnosis not present

## 2015-07-20 DIAGNOSIS — Z87440 Personal history of urinary (tract) infections: Secondary | ICD-10-CM | POA: Diagnosis not present

## 2015-07-20 DIAGNOSIS — F0390 Unspecified dementia without behavioral disturbance: Secondary | ICD-10-CM | POA: Diagnosis not present

## 2015-07-20 DIAGNOSIS — I10 Essential (primary) hypertension: Secondary | ICD-10-CM | POA: Diagnosis not present

## 2015-07-21 ENCOUNTER — Other Ambulatory Visit: Payer: Self-pay | Admitting: Primary Care

## 2015-07-21 ENCOUNTER — Telehealth: Payer: Self-pay | Admitting: Primary Care

## 2015-07-21 DIAGNOSIS — M1990 Unspecified osteoarthritis, unspecified site: Secondary | ICD-10-CM

## 2015-07-21 DIAGNOSIS — S90922D Unspecified superficial injury of left foot, subsequent encounter: Secondary | ICD-10-CM | POA: Diagnosis not present

## 2015-07-21 DIAGNOSIS — I10 Essential (primary) hypertension: Secondary | ICD-10-CM | POA: Diagnosis not present

## 2015-07-21 DIAGNOSIS — Z9181 History of falling: Secondary | ICD-10-CM | POA: Diagnosis not present

## 2015-07-21 DIAGNOSIS — M6281 Muscle weakness (generalized): Secondary | ICD-10-CM | POA: Diagnosis not present

## 2015-07-21 DIAGNOSIS — Z87440 Personal history of urinary (tract) infections: Secondary | ICD-10-CM | POA: Diagnosis not present

## 2015-07-21 DIAGNOSIS — F0390 Unspecified dementia without behavioral disturbance: Secondary | ICD-10-CM | POA: Diagnosis not present

## 2015-07-21 NOTE — Telephone Encounter (Signed)
Sent faxed to [facility] at [phone]

## 2015-07-21 NOTE — Telephone Encounter (Signed)
Printed and placed in [place].

## 2015-07-21 NOTE — Telephone Encounter (Signed)
Marlowe Kays from home health called -  request script for tub transfer bench With dr Jenne Pane number  cb number is [phone] [facility]ax to [phone] - [phone] Thank you

## 2015-07-22 DIAGNOSIS — S90922D Unspecified superficial injury of left foot, subsequent encounter: Secondary | ICD-10-CM | POA: Diagnosis not present

## 2015-07-22 DIAGNOSIS — M6281 Muscle weakness (generalized): Secondary | ICD-10-CM | POA: Diagnosis not present

## 2015-07-22 DIAGNOSIS — Z87440 Personal history of urinary (tract) infections: Secondary | ICD-10-CM | POA: Diagnosis not present

## 2015-07-22 DIAGNOSIS — F0390 Unspecified dementia without behavioral disturbance: Secondary | ICD-10-CM | POA: Diagnosis not present

## 2015-07-22 DIAGNOSIS — I10 Essential (primary) hypertension: Secondary | ICD-10-CM | POA: Diagnosis not present

## 2015-07-22 DIAGNOSIS — Z9181 History of falling: Secondary | ICD-10-CM | POA: Diagnosis not present

## 2015-07-26 DIAGNOSIS — F0390 Unspecified dementia without behavioral disturbance: Secondary | ICD-10-CM | POA: Diagnosis not present

## 2015-07-26 DIAGNOSIS — I10 Essential (primary) hypertension: Secondary | ICD-10-CM | POA: Diagnosis not present

## 2015-07-26 DIAGNOSIS — S90922D Unspecified superficial injury of left foot, subsequent encounter: Secondary | ICD-10-CM | POA: Diagnosis not present

## 2015-07-26 DIAGNOSIS — Z9181 History of falling: Secondary | ICD-10-CM | POA: Diagnosis not present

## 2015-07-26 DIAGNOSIS — Z87440 Personal history of urinary (tract) infections: Secondary | ICD-10-CM | POA: Diagnosis not present

## 2015-07-26 DIAGNOSIS — M6281 Muscle weakness (generalized): Secondary | ICD-10-CM | POA: Diagnosis not present

## 2015-07-27 DIAGNOSIS — S90922D Unspecified superficial injury of left foot, subsequent encounter: Secondary | ICD-10-CM | POA: Diagnosis not present

## 2015-07-27 DIAGNOSIS — Z9181 History of falling: Secondary | ICD-10-CM | POA: Diagnosis not present

## 2015-07-27 DIAGNOSIS — F0390 Unspecified dementia without behavioral disturbance: Secondary | ICD-10-CM | POA: Diagnosis not present

## 2015-07-27 DIAGNOSIS — M6281 Muscle weakness (generalized): Secondary | ICD-10-CM | POA: Diagnosis not present

## 2015-07-27 DIAGNOSIS — I10 Essential (primary) hypertension: Secondary | ICD-10-CM | POA: Diagnosis not present

## 2015-07-27 DIAGNOSIS — Z87440 Personal history of urinary (tract) infections: Secondary | ICD-10-CM | POA: Diagnosis not present

## 2015-07-28 ENCOUNTER — Telehealth: Payer: Self-pay

## 2015-07-28 DIAGNOSIS — Z87440 Personal history of urinary (tract) infections: Secondary | ICD-10-CM | POA: Diagnosis not present

## 2015-07-28 DIAGNOSIS — Z9181 History of falling: Secondary | ICD-10-CM | POA: Diagnosis not present

## 2015-07-28 DIAGNOSIS — M6281 Muscle weakness (generalized): Secondary | ICD-10-CM | POA: Diagnosis not present

## 2015-07-28 DIAGNOSIS — S90922D Unspecified superficial injury of left foot, subsequent encounter: Secondary | ICD-10-CM | POA: Diagnosis not present

## 2015-07-28 DIAGNOSIS — I10 Essential (primary) hypertension: Secondary | ICD-10-CM | POA: Diagnosis not present

## 2015-07-28 DIAGNOSIS — F0390 Unspecified dementia without behavioral disturbance: Secondary | ICD-10-CM | POA: Diagnosis not present

## 2015-07-28 NOTE — Telephone Encounter (Signed)
Message left for Ms Nicole Downs to return my call.

## 2015-07-28 NOTE — Telephone Encounter (Signed)
Teola Bradley left v/m requesting cb about dressing change orders; ulcers not healing. Left v/m requesting Mendel Ryder to cb.

## 2015-07-28 NOTE — Telephone Encounter (Signed)
Approved. If they appear infectious then I will need to evaluate.  Thanks.

## 2015-07-28 NOTE — Telephone Encounter (Signed)
Ms Fredderick Phenix with [facility] left v/m; requesting changing dressing order for venous ulcers that are not healing to puricole and optifoam dressing.Please advise.

## 2015-07-29 ENCOUNTER — Telehealth: Payer: Self-pay

## 2015-07-29 DIAGNOSIS — Z87440 Personal history of urinary (tract) infections: Secondary | ICD-10-CM | POA: Diagnosis not present

## 2015-07-29 DIAGNOSIS — I10 Essential (primary) hypertension: Secondary | ICD-10-CM | POA: Diagnosis not present

## 2015-07-29 DIAGNOSIS — Z9181 History of falling: Secondary | ICD-10-CM | POA: Diagnosis not present

## 2015-07-29 DIAGNOSIS — M6281 Muscle weakness (generalized): Secondary | ICD-10-CM | POA: Diagnosis not present

## 2015-07-29 DIAGNOSIS — S90922D Unspecified superficial injury of left foot, subsequent encounter: Secondary | ICD-10-CM | POA: Diagnosis not present

## 2015-07-29 DIAGNOSIS — F0390 Unspecified dementia without behavioral disturbance: Secondary | ICD-10-CM | POA: Diagnosis not present

## 2015-07-29 NOTE — Telephone Encounter (Signed)
Called and notified  Ms Nicole Downs of Nicole Downs's comments. Ms Nicole Downs stated that the venous ulcers do not appear to be infectious but will update if changes.

## 2015-07-29 NOTE — Telephone Encounter (Signed)
Message left for patient to return my call.  

## 2015-07-29 NOTE — Telephone Encounter (Signed)
Erin called back. Notified her that Anda Kraft approved the verbal order.

## 2015-07-29 NOTE — Telephone Encounter (Signed)
Erin Pt with [facility] left v/m requesting verbal order for continuing home health PT 2 x a week for 3 weeks for community reentry and gait training.

## 2015-07-29 NOTE — Telephone Encounter (Signed)
Approved. Thanks.

## 2015-07-30 DIAGNOSIS — I10 Essential (primary) hypertension: Secondary | ICD-10-CM | POA: Diagnosis not present

## 2015-07-30 DIAGNOSIS — S90922D Unspecified superficial injury of left foot, subsequent encounter: Secondary | ICD-10-CM | POA: Diagnosis not present

## 2015-07-30 DIAGNOSIS — Z87440 Personal history of urinary (tract) infections: Secondary | ICD-10-CM | POA: Diagnosis not present

## 2015-07-30 DIAGNOSIS — M6281 Muscle weakness (generalized): Secondary | ICD-10-CM | POA: Diagnosis not present

## 2015-07-30 DIAGNOSIS — Z9181 History of falling: Secondary | ICD-10-CM | POA: Diagnosis not present

## 2015-07-30 DIAGNOSIS — F0390 Unspecified dementia without behavioral disturbance: Secondary | ICD-10-CM | POA: Diagnosis not present

## 2015-08-02 ENCOUNTER — Encounter: Payer: Self-pay | Admitting: Primary Care

## 2015-08-02 ENCOUNTER — Ambulatory Visit (INDEPENDENT_AMBULATORY_CARE_PROVIDER_SITE_OTHER): Payer: Medicare Other | Admitting: Primary Care

## 2015-08-02 VITALS — BP 128/70 | HR 80 | Temp 97.8°F | Ht 59.75 in | Wt 107.1 lb

## 2015-08-02 DIAGNOSIS — R0989 Other specified symptoms and signs involving the circulatory and respiratory systems: Secondary | ICD-10-CM

## 2015-08-02 DIAGNOSIS — L089 Local infection of the skin and subcutaneous tissue, unspecified: Secondary | ICD-10-CM | POA: Diagnosis not present

## 2015-08-02 DIAGNOSIS — Z9181 History of falling: Secondary | ICD-10-CM | POA: Diagnosis not present

## 2015-08-02 DIAGNOSIS — I10 Essential (primary) hypertension: Secondary | ICD-10-CM | POA: Diagnosis not present

## 2015-08-02 DIAGNOSIS — D492 Neoplasm of unspecified behavior of bone, soft tissue, and skin: Secondary | ICD-10-CM | POA: Diagnosis not present

## 2015-08-02 DIAGNOSIS — I739 Peripheral vascular disease, unspecified: Secondary | ICD-10-CM | POA: Diagnosis not present

## 2015-08-02 DIAGNOSIS — S90922D Unspecified superficial injury of left foot, subsequent encounter: Secondary | ICD-10-CM | POA: Diagnosis not present

## 2015-08-02 DIAGNOSIS — F0390 Unspecified dementia without behavioral disturbance: Secondary | ICD-10-CM | POA: Diagnosis not present

## 2015-08-02 DIAGNOSIS — Z87440 Personal history of urinary (tract) infections: Secondary | ICD-10-CM | POA: Diagnosis not present

## 2015-08-02 DIAGNOSIS — M6281 Muscle weakness (generalized): Secondary | ICD-10-CM | POA: Diagnosis not present

## 2015-08-02 MED ORDER — DOXYCYCLINE HYCLATE 100 MG PO TABS: 100.0000 mg | ORAL_TABLET | Freq: Two times a day (BID) | ORAL | Status: DC

## 2015-08-02 NOTE — Progress Notes (Signed)
Pre visit review using our clinic review tool, if applicable. No additional management support is needed unless otherwise documented below in the visit note. 

## 2015-08-02 NOTE — Assessment & Plan Note (Signed)
Evaluated by vascular who completed testing and cannot find reason for discoloration to feet.  1+ pedal pulses present.   2 open ulcers to left foot. Anterior wound appears infectious and will treat with round of Doxycycline.  Wound culture obtained.  Continue home health dressing changes. Suspect poor vascularity causing delayed wound healing. Will continue to monitor.

## 2015-08-02 NOTE — Patient Instructions (Signed)
Start Doxycycline antibiotic. Take 1 tablet by mouth twice daily for 10 days.  You will be contacted regarding your referral to Dermatology.  Please let us know if you have not heard back within one week.   Please notify me in 1 week if you've not noticed an improvement to your foot in 1 week.  It was a pleasure to see you today!

## 2015-08-02 NOTE — Progress Notes (Signed)
Subjective:    Patient ID: Nicole Downs, female    DOB: 1923-08-08, 82 y.o.   MRN: [id number]  HPI  Nicole Downs is an 82 year old female who presents today with a chief complaint of foot wound. She has two wounds to her left foot. One located to her left anterior ankle and another to the left dorsal aspect of her foot. These wounds have ben present since mid December and have not improved.  Her wounds are bothersome with palpation. She has a home health nurse that comes twice weekly. Her daughter denies fevers, weakness, confusion. The home health nurse notified the patient's daughter that one of the wounds appears to be getting worse.  2) Facial Skin Growth: She has a history of skin cancer in summer of 2016. Has noticed growth to the skin of her right mandible 2 weeks ago. She's been picking at her sore. Denies pain, reports itching. She's applied neosporin without improvement. She had a cancerous lesion removed to the opposite site of her left mandible last summer.  Review of Systems  Constitutional: Negative for fever, chills and fatigue.  Skin: Positive for color change and wound.  Neurological: Negative for weakness.       Past Medical History  Diagnosis Date  . Arthritis   . Glaucoma   . Hypertension   . Hypothyroidism   . Acute confusion   . Poor peripheral circulation ([place])   . Hyponatremia     Social History   Social History  . Marital Status: Single    Spouse Name: N/A  . Number of Children: N/A  . Years of Education: N/A   Occupational History  . Not on file.   Social History Main Topics  . Smoking status: Never Smoker   . Smokeless tobacco: Not on file  . Alcohol Use: No  . Drug Use: Not on file  . Sexual Activity: Not on file   Other Topics Concern  . Not on file   Social History Narrative   Lives with daughter, recent.       Past Surgical History  Procedure Laterality Date  . Breast biopsy      No family history on file.  No Known  Allergies  Current Outpatient Prescriptions on File Prior to Visit  Medication Sig Dispense Refill  . amLODipine (NORVASC) 10 MG tablet Take 1 tablet (10 mg total) by mouth daily. 90 tablet 1  . donepezil (ARICEPT) 5 MG tablet Take 1 tablet (5 mg total) by mouth at bedtime. 90 tablet 1  . levobunolol (BETAGAN) 0.5 % ophthalmic solution Place 1 drop into both eyes 2 (two) times daily. 5 mL 3  . levothyroxine (SYNTHROID, LEVOTHROID) 25 MCG tablet Take 1 tablet (25 mcg total) by mouth daily before breakfast. 90 tablet 1  . lisinopril (PRINIVIL,ZESTRIL) 20 MG tablet Take 1 tablet (20 mg total) by mouth daily. 90 tablet 1  . primidone (MYSOLINE) 50 MG tablet Take 1 tablet (50 mg total) by mouth 2 (two) times daily. 180 tablet 1   No current facility-administered medications on file prior to visit.    BP 128/70 mmHg  Pulse 80  Temp(Src) 97.8 F (36.6 C) (Oral)  Ht 4' 11.75" (1.518 m)  Wt 107 lb 1.9 oz (48.589 kg)  BMI 21.09 kg/m2  SpO2 94%    Objective:   Physical Exam  Constitutional: She appears well-nourished.  Cardiovascular: Normal rate and regular rhythm.   Pulmonary/Chest: Effort normal and breath sounds normal.  Skin:  1.5  cm open wound to left dorsal aspect of foot. Erythema and clear drainage present. Tender. Appears infectious.   1 cm non infectious appearing healing ulcer to left anterior ankle.          Assessment & Plan:  Skin Growth:  Present to skin of right mandible. Increase in size over past 2 weeks. History of skin cancer with removal of lesion to left mandible last summer. Given history and presentation of growth, will have dermatology evaluate.  Referral placed.

## 2015-08-03 DIAGNOSIS — S90922D Unspecified superficial injury of left foot, subsequent encounter: Secondary | ICD-10-CM | POA: Diagnosis not present

## 2015-08-03 DIAGNOSIS — M6281 Muscle weakness (generalized): Secondary | ICD-10-CM | POA: Diagnosis not present

## 2015-08-03 DIAGNOSIS — Z87440 Personal history of urinary (tract) infections: Secondary | ICD-10-CM | POA: Diagnosis not present

## 2015-08-03 DIAGNOSIS — Z9181 History of falling: Secondary | ICD-10-CM | POA: Diagnosis not present

## 2015-08-03 DIAGNOSIS — F0390 Unspecified dementia without behavioral disturbance: Secondary | ICD-10-CM | POA: Diagnosis not present

## 2015-08-03 DIAGNOSIS — I10 Essential (primary) hypertension: Secondary | ICD-10-CM | POA: Diagnosis not present

## 2015-08-05 DIAGNOSIS — F0390 Unspecified dementia without behavioral disturbance: Secondary | ICD-10-CM | POA: Diagnosis not present

## 2015-08-05 DIAGNOSIS — Z87440 Personal history of urinary (tract) infections: Secondary | ICD-10-CM | POA: Diagnosis not present

## 2015-08-05 DIAGNOSIS — M6281 Muscle weakness (generalized): Secondary | ICD-10-CM | POA: Diagnosis not present

## 2015-08-05 DIAGNOSIS — Z9181 History of falling: Secondary | ICD-10-CM | POA: Diagnosis not present

## 2015-08-05 DIAGNOSIS — I10 Essential (primary) hypertension: Secondary | ICD-10-CM | POA: Diagnosis not present

## 2015-08-05 DIAGNOSIS — S90922D Unspecified superficial injury of left foot, subsequent encounter: Secondary | ICD-10-CM | POA: Diagnosis not present

## 2015-08-06 LAB — WOUND CULTURE: Gram Stain: NONE SEEN

## 2015-08-10 DIAGNOSIS — S90922D Unspecified superficial injury of left foot, subsequent encounter: Secondary | ICD-10-CM | POA: Diagnosis not present

## 2015-08-10 DIAGNOSIS — Z87440 Personal history of urinary (tract) infections: Secondary | ICD-10-CM | POA: Diagnosis not present

## 2015-08-10 DIAGNOSIS — M6281 Muscle weakness (generalized): Secondary | ICD-10-CM | POA: Diagnosis not present

## 2015-08-10 DIAGNOSIS — F0390 Unspecified dementia without behavioral disturbance: Secondary | ICD-10-CM | POA: Diagnosis not present

## 2015-08-10 DIAGNOSIS — I10 Essential (primary) hypertension: Secondary | ICD-10-CM | POA: Diagnosis not present

## 2015-08-10 DIAGNOSIS — Z9181 History of falling: Secondary | ICD-10-CM | POA: Diagnosis not present

## 2015-08-12 DIAGNOSIS — Z9181 History of falling: Secondary | ICD-10-CM | POA: Diagnosis not present

## 2015-08-12 DIAGNOSIS — Z87440 Personal history of urinary (tract) infections: Secondary | ICD-10-CM | POA: Diagnosis not present

## 2015-08-12 DIAGNOSIS — F0390 Unspecified dementia without behavioral disturbance: Secondary | ICD-10-CM | POA: Diagnosis not present

## 2015-08-12 DIAGNOSIS — M6281 Muscle weakness (generalized): Secondary | ICD-10-CM | POA: Diagnosis not present

## 2015-08-12 DIAGNOSIS — I10 Essential (primary) hypertension: Secondary | ICD-10-CM | POA: Diagnosis not present

## 2015-08-12 DIAGNOSIS — S90922D Unspecified superficial injury of left foot, subsequent encounter: Secondary | ICD-10-CM | POA: Diagnosis not present

## 2015-08-17 DIAGNOSIS — M6281 Muscle weakness (generalized): Secondary | ICD-10-CM | POA: Diagnosis not present

## 2015-08-17 DIAGNOSIS — F0390 Unspecified dementia without behavioral disturbance: Secondary | ICD-10-CM | POA: Diagnosis not present

## 2015-08-17 DIAGNOSIS — Z9181 History of falling: Secondary | ICD-10-CM | POA: Diagnosis not present

## 2015-08-17 DIAGNOSIS — S90922D Unspecified superficial injury of left foot, subsequent encounter: Secondary | ICD-10-CM | POA: Diagnosis not present

## 2015-08-17 DIAGNOSIS — I10 Essential (primary) hypertension: Secondary | ICD-10-CM | POA: Diagnosis not present

## 2015-08-17 DIAGNOSIS — Z87440 Personal history of urinary (tract) infections: Secondary | ICD-10-CM | POA: Diagnosis not present

## 2015-08-19 DIAGNOSIS — Z9181 History of falling: Secondary | ICD-10-CM | POA: Diagnosis not present

## 2015-08-19 DIAGNOSIS — Z87440 Personal history of urinary (tract) infections: Secondary | ICD-10-CM | POA: Diagnosis not present

## 2015-08-19 DIAGNOSIS — F0390 Unspecified dementia without behavioral disturbance: Secondary | ICD-10-CM | POA: Diagnosis not present

## 2015-08-19 DIAGNOSIS — I10 Essential (primary) hypertension: Secondary | ICD-10-CM | POA: Diagnosis not present

## 2015-08-19 DIAGNOSIS — M6281 Muscle weakness (generalized): Secondary | ICD-10-CM | POA: Diagnosis not present

## 2015-08-19 DIAGNOSIS — S90922D Unspecified superficial injury of left foot, subsequent encounter: Secondary | ICD-10-CM | POA: Diagnosis not present

## 2015-08-20 DIAGNOSIS — F0390 Unspecified dementia without behavioral disturbance: Secondary | ICD-10-CM | POA: Diagnosis not present

## 2015-08-20 DIAGNOSIS — Z87440 Personal history of urinary (tract) infections: Secondary | ICD-10-CM | POA: Diagnosis not present

## 2015-08-20 DIAGNOSIS — M6281 Muscle weakness (generalized): Secondary | ICD-10-CM | POA: Diagnosis not present

## 2015-08-20 DIAGNOSIS — S90922D Unspecified superficial injury of left foot, subsequent encounter: Secondary | ICD-10-CM | POA: Diagnosis not present

## 2015-08-20 DIAGNOSIS — Z9181 History of falling: Secondary | ICD-10-CM | POA: Diagnosis not present

## 2015-08-20 DIAGNOSIS — I10 Essential (primary) hypertension: Secondary | ICD-10-CM | POA: Diagnosis not present

## 2015-09-01 ENCOUNTER — Ambulatory Visit (HOSPITAL_BASED_OUTPATIENT_CLINIC_OR_DEPARTMENT_OTHER)
Admission: RE | Admit: 2015-09-01 | Discharge: 2015-09-01 | Disposition: A | Discharge status: Home / Self Care | Payer: Medicare Other | Source: Ambulatory Visit | Attending: Family Medicine | Admitting: Family Medicine

## 2015-09-01 ENCOUNTER — Encounter: Payer: Self-pay | Admitting: Family Medicine

## 2015-09-01 ENCOUNTER — Ambulatory Visit (INDEPENDENT_AMBULATORY_CARE_PROVIDER_SITE_OTHER): Payer: Medicare Other | Admitting: Family Medicine

## 2015-09-01 ENCOUNTER — Telehealth: Payer: Self-pay | Admitting: Primary Care

## 2015-09-01 VITALS — BP 142/80 | HR 70 | Wt 110.6 lb

## 2015-09-01 DIAGNOSIS — M79662 Pain in left lower leg: Secondary | ICD-10-CM

## 2015-09-01 DIAGNOSIS — M79605 Pain in left leg: Secondary | ICD-10-CM | POA: Insufficient documentation

## 2015-09-01 DIAGNOSIS — Z87440 Personal history of urinary (tract) infections: Secondary | ICD-10-CM

## 2015-09-01 DIAGNOSIS — F05 Delirium due to known physiological condition: Secondary | ICD-10-CM | POA: Insufficient documentation

## 2015-09-01 DIAGNOSIS — M7989 Other specified soft tissue disorders: Secondary | ICD-10-CM

## 2015-09-01 DIAGNOSIS — R410 Disorientation, unspecified: Secondary | ICD-10-CM

## 2015-09-01 DIAGNOSIS — I7090 Unspecified atherosclerosis: Secondary | ICD-10-CM | POA: Diagnosis not present

## 2015-09-01 DIAGNOSIS — J439 Emphysema, unspecified: Secondary | ICD-10-CM | POA: Diagnosis not present

## 2015-09-01 DIAGNOSIS — S81802A Unspecified open wound, left lower leg, initial encounter: Secondary | ICD-10-CM

## 2015-09-01 DIAGNOSIS — N3000 Acute cystitis without hematuria: Secondary | ICD-10-CM

## 2015-09-01 DIAGNOSIS — R6889 Other general symptoms and signs: Secondary | ICD-10-CM | POA: Diagnosis not present

## 2015-09-01 DIAGNOSIS — S81812A Laceration without foreign body, left lower leg, initial encounter: Secondary | ICD-10-CM

## 2015-09-01 MED ORDER — CEPHALEXIN 500 MG PO CAPS: 500.0000 mg | ORAL_CAPSULE | Freq: Two times a day (BID) | ORAL | Status: DC

## 2015-09-01 NOTE — Telephone Encounter (Signed)
[name] - pt's daughter says that CMA asked that she give her a call back to discuss some medications for the pt.   CB: [phone]

## 2015-09-01 NOTE — Progress Notes (Addendum)
[place] [facility] [address] [place], [place] [address] [phone]  Date:  09/01/2015   Name:  Nicole Downs   DOB:  09-25-1923   MRN:  [id number]  PCP:  Sheral Flow, NP    Chief Complaint: Abrasion   History of Present Illness:  Nicole Downs is a 82 y.o. very pleasant female patient who presents with the following:  History of memory loss, HTN, poor circulation here today with concern about a wound on her left leg.    Her tetanus shot was about 7 years ago.   She sustained a skin tear on the left leg about 3 days ago while getting out of a car.  It seemed to be ok, they were treating it with neosporin.  however 2 days ago they noted swelling of the leg so her daughter brought her in to be seen and to make sure there is no infection She has otherwise been at her baseline, no fevers  She is living with her daughter currently.  She has shows some steady decline of her mental status over the last few months.  Her daughter would like to make sure there is no other reversible cause of her decline such as a UTI or pneumonia which is certainly reasonable  Patient Active Problem List   Diagnosis Date Noted  . Essential hypertension 06/24/2015  . Hypothyroidism 06/24/2015  . Confusion 06/24/2015  . Poor peripheral circulation ([place]) 06/24/2015    Past Medical History  Diagnosis Date  . Arthritis   . Glaucoma   . Hypertension   . Hypothyroidism   . Acute confusion   . Poor peripheral circulation ([place])   . Hyponatremia     Past Surgical History  Procedure Laterality Date  . Breast biopsy      Social History  Substance Use Topics  . Smoking status: Never Smoker   . Smokeless tobacco: Not on file  . Alcohol Use: No    No family history on file.  No Known Allergies  Medication list has been reviewed and updated.  Current Outpatient Prescriptions on File Prior to Visit  Medication Sig  Dispense Refill  . amLODipine (NORVASC) 10 MG tablet Take 1 tablet (10 mg total) by mouth daily. 90 tablet 1  . donepezil (ARICEPT) 5 MG tablet Take 1 tablet (5 mg total) by mouth at bedtime. 90 tablet 1  . doxycycline (VIBRA-TABS) 100 MG tablet Take 1 tablet (100 mg total) by mouth 2 (two) times daily. 20 tablet 0  . levobunolol (BETAGAN) 0.5 % ophthalmic solution Place 1 drop into both eyes 2 (two) times daily. 5 mL 3  . levothyroxine (SYNTHROID, LEVOTHROID) 25 MCG tablet Take 1 tablet (25 mcg total) by mouth daily before breakfast. 90 tablet 1  . lisinopril (PRINIVIL,ZESTRIL) 20 MG tablet Take 1 tablet (20 mg total) by mouth daily. 90 tablet 1  . primidone (MYSOLINE) 50 MG tablet Take 1 tablet (50 mg total) by mouth 2 (two) times daily. 180 tablet 1   No current facility-administered medications on file prior to visit.    Review of Systems:  As per HPI- otherwise negative.   Physical Examination:  Filed Vitals:   09/01/15 1025  BP: 142/80  Pulse: 70   Wt Readings from Last 3 Encounters:  09/01/15 110 lb 9.6 oz (50.168 kg)  08/02/15 107 lb 1.9 oz (48.589 kg)  06/24/15 107 lb (48.535 kg)    GEN: WDWN, NAD, Non-toxic, A &  O x 3, elderly, petite build.   HEENT: Atraumatic, Normocephalic. Neck supple. No masses, No LAD.  Ears and Nose: No external deformity. CV: RRR, No M/G/R. No JVD. No thrill. No extra heart sounds. PULM: CTA B, no wheezes, crackles, rhonchi. No retractions. No resp. distress. No accessory muscle use. EXTR: No c/c/e NEURO wheelchair PSYCH:dementia, but is able to interact somewhat Left leg: there is mild (1+), soft pitting edema to the upper tibia. Skin tear on the lateral leg. She also has a wound on the dorsum of the left foot which has been present for some time.    Cleaned wound on leg with peroxide and dressed with steristrips, bandage.    Dg Chest 2 View  09/01/2015  CLINICAL DATA:  82 year old female history of confusion and hypertension EXAM: CHEST  - 2 VIEW COMPARISON:  No prior for comparison FINDINGS: Cardiomediastinal silhouette within normal limits. Atherosclerotic calcification. No evidence of pulmonary vascular congestion. Stigmata of emphysema, with increased retrosternal airspace, flattened hemidiaphragms, increased AP diameter, and hyperinflation on the AP view. Coarsened interstitial markings bilateral lungs. No confluent airspace disease, pneumothorax or pleural effusion. Diffuse osteopenia. No displaced fracture. Evidence of compression fractures of the lower thoracic and upper lumbar spine, indeterminate age. Unremarkable appearance of the upper abdomen. IMPRESSION: Emphysema and chronic lung changes without definite evidence of acute cardiopulmonary disease. Atherosclerosis. Compression deformities of lower thoracic and upper lumbar levels, indeterminate age. Signed, Dulcy Fanny. Earleen Newport, DO [facility] Radiology Electronically Signed   By: Corrie Mckusick D.O.   On: 09/01/2015 11:34   US Venous Img Lower Unilateral Left  09/01/2015  CLINICAL DATA:  Left calf pain and swelling after hitting leg with car door 3-4 days ago. Evaluate for DVT. EXAM: LEFT LOWER EXTREMITY VENOUS DOPPLER ULTRASOUND TECHNIQUE: Gray-scale sonography with graded compression, as well as color Doppler and duplex ultrasound were performed to evaluate the lower extremity deep venous systems from the level of the common femoral vein and including the common femoral, femoral, profunda femoral, popliteal and calf veins including the posterior tibial, peroneal and gastrocnemius veins when visible. The superficial great saphenous vein was also interrogated. Spectral Doppler was utilized to evaluate flow at rest and with distal augmentation maneuvers in the common femoral, femoral and popliteal veins. COMPARISON:  None. FINDINGS: Contralateral Common Femoral Vein: Respiratory phasicity is normal and symmetric with the symptomatic side. No  evidence of thrombus. Normal compressibility. Common Femoral Vein: No evidence of thrombus. Normal compressibility, respiratory phasicity and response to augmentation. Saphenofemoral Junction: No evidence of thrombus. Normal compressibility and flow on color Doppler imaging. Profunda Femoral Vein: No evidence of thrombus. Normal compressibility and flow on color Doppler imaging. Femoral Vein: No evidence of thrombus. Normal compressibility, respiratory phasicity and response to augmentation. Popliteal Vein: No evidence of thrombus. Normal compressibility, respiratory phasicity and response to augmentation. Calf Veins: No evidence of thrombus. Normal compressibility and flow on color Doppler imaging. Superficial Great Saphenous Vein: No evidence of thrombus. Normal compressibility and flow on color Doppler imaging. Venous Reflux:  None. Other Findings: There is a minimal amount of subcutaneous edema noted at the level of the left calf. IMPRESSION: No evidence of DVT within the left lower extremity. Electronically Signed   By: Sandi Mariscal M.D.   On: 09/01/2015 13:29    Assessment and Plan: Skin tear of left lower leg without complication, initial encounter - Plan: cephALEXin (KEFLEX) 500 MG capsule  Pain and swelling of left lower leg - Plan: US Venous Img  Lower Unilateral Left, cephALEXin (KEFLEX) 500 MG capsule  Subacute confusional state - Plan: DG Chest 2 View, Urine culture  History of UTI - Plan: Urine culture  Disorientation - Plan: Urine culture  Here today with concern about an infected wound on the left leg vs DVT causing swelling.  Although the wound does not appear infected the leg is certainly swollen.  Will do a doppler today to rule out DVT and start keflex Also will do CXR and urine culture  Meds ordered this encounter  Medications  . cephALEXin (KEFLEX) 500 MG capsule    Sig: Take 1 capsule (500 mg total) by mouth 2 (two) times daily.    Dispense:  20 capsule    Refill:  0  LMOM  that [place] is negative   Signed Lamar Blinks, MD  3/18 Received her final urine culture- she has a multi drug resistant UTI with nearly no po options.  Is sensitive to macrobid but we cannot use due to her creat clearance being under 30.  Called pharmacist at [facility]- we decided to use fosfomycin 2 doses 48 hours apart.  Called and d/w her daughter.  Also discussed findings on her CXR.

## 2015-09-01 NOTE — Patient Instructions (Signed)
We will have you go first to lab to leave a urine for culture and then downstairs for your chest x-ray and ultrasound of your leg Then you can go home and I will be in touch asap! Take the keflex antibiotic twice a day for your leg wound Keep the wound clean and dry- you can bathe as usual and then cover with a clean dressing

## 2015-09-01 NOTE — Telephone Encounter (Signed)
Patient is not taking benzapril, added other vitamins to medication list.

## 2015-09-03 LAB — URINE CULTURE: Colony Count: >=100000

## 2015-09-04 MED ORDER — FOSFOMYCIN TROMETHAMINE 3 G PO PACK: 3.0000 g | PACK | Freq: Once | ORAL | Status: DC

## 2015-09-04 NOTE — Addendum Note (Signed)
Addended by: Lamar Blinks C on: 09/04/2015 04:32 PM   Modules accepted: Orders

## 2015-09-06 ENCOUNTER — Encounter: Payer: Self-pay | Admitting: Family Medicine

## 2015-09-06 ENCOUNTER — Telehealth: Payer: Self-pay | Admitting: Primary Care

## 2015-09-06 NOTE — Telephone Encounter (Signed)
Called and was able to get her fosfomycin approved.  Waiting on fax

## 2015-09-06 NOTE — Telephone Encounter (Signed)
Received Approval letter; forwarded to pharmacy/SLS 03/20

## 2015-09-06 NOTE — Telephone Encounter (Signed)
Caller name: Talmadge Coventry Relationship to patient: daughter Can be reached: [phone] Pharmacy: [id number] - [place], [place]  Reason for call: Pt calling about fosfomycin stating that insurance won't cover it. She said that Dr. Hazel Sams to call them to approve the drug. I'm assuming PA however the daughter is not sure. I told her if it is PA that we require form sent to [place] from pharmacy. She said Dr. Lorelei Pont talked to the pharmacy at length on Saturday about pt getting this medication and she is aware of what will need to happen. She said they picked up 1/2 the meds Saturday and have to pick up the rest today. Insurance ph# [phone] press option 1.

## 2015-09-20 DIAGNOSIS — L570 Actinic keratosis: Secondary | ICD-10-CM | POA: Diagnosis not present

## 2015-09-20 DIAGNOSIS — D485 Neoplasm of uncertain behavior of skin: Secondary | ICD-10-CM | POA: Diagnosis not present

## 2015-09-20 DIAGNOSIS — C4492 Squamous cell carcinoma of skin, unspecified: Secondary | ICD-10-CM

## 2015-09-20 DIAGNOSIS — D0439 Carcinoma in situ of skin of other parts of face: Secondary | ICD-10-CM | POA: Diagnosis not present

## 2015-09-20 DIAGNOSIS — L578 Other skin changes due to chronic exposure to nonionizing radiation: Secondary | ICD-10-CM | POA: Diagnosis not present

## 2015-09-20 DIAGNOSIS — L821 Other seborrheic keratosis: Secondary | ICD-10-CM | POA: Diagnosis not present

## 2015-09-20 HISTORY — DX: Squamous cell carcinoma of skin, unspecified: C44.92

## 2015-10-08 ENCOUNTER — Ambulatory Visit (INDEPENDENT_AMBULATORY_CARE_PROVIDER_SITE_OTHER): Payer: Medicare Other | Admitting: Primary Care

## 2015-10-08 ENCOUNTER — Other Ambulatory Visit (INDEPENDENT_AMBULATORY_CARE_PROVIDER_SITE_OTHER): Payer: Medicare Other

## 2015-10-08 VITALS — BP 124/76 | HR 82 | Temp 98.2°F | Ht 59.75 in | Wt 108.1 lb

## 2015-10-08 DIAGNOSIS — I739 Peripheral vascular disease, unspecified: Secondary | ICD-10-CM

## 2015-10-08 DIAGNOSIS — I10 Essential (primary) hypertension: Secondary | ICD-10-CM | POA: Diagnosis not present

## 2015-10-08 DIAGNOSIS — D509 Iron deficiency anemia, unspecified: Secondary | ICD-10-CM | POA: Diagnosis not present

## 2015-10-08 DIAGNOSIS — R0989 Other specified symptoms and signs involving the circulatory and respiratory systems: Secondary | ICD-10-CM

## 2015-10-08 DIAGNOSIS — F0390 Unspecified dementia without behavioral disturbance: Secondary | ICD-10-CM

## 2015-10-08 DIAGNOSIS — D649 Anemia, unspecified: Secondary | ICD-10-CM | POA: Diagnosis not present

## 2015-10-08 DIAGNOSIS — E559 Vitamin D deficiency, unspecified: Secondary | ICD-10-CM | POA: Diagnosis not present

## 2015-10-08 DIAGNOSIS — R600 Localized edema: Secondary | ICD-10-CM | POA: Insufficient documentation

## 2015-10-08 DIAGNOSIS — G250 Essential tremor: Secondary | ICD-10-CM

## 2015-10-08 LAB — COMPREHENSIVE METABOLIC PANEL
ALBUMIN: 4.1 g/dL (ref 3.5–5.2)
ALK PHOS: 73 U/L (ref 39–117)
ALT: 13 U/L (ref 0–35)
AST: 19 U/L (ref 0–37)
BILIRUBIN TOTAL: 0.3 mg/dL (ref 0.2–1.2)
BUN: 18 mg/dL (ref 6–23)
CALCIUM: 10 mg/dL (ref 8.4–10.5)
CO2: 28 mEq/L (ref 19–32)
Chloride: 100 mEq/L (ref 96–112)
Creatinine, Ser: 0.88 mg/dL (ref 0.40–1.20)
GFR: 64.15 mL/min (ref >60.00)
Glucose, Bld: 96 mg/dL (ref 70–99)
Potassium: 4.4 mEq/L (ref 3.5–5.1)
Sodium: 138 mEq/L (ref 135–145)
TOTAL PROTEIN: 8 g/dL (ref 6.0–8.3)

## 2015-10-08 LAB — CBC
HCT: 34.6 % — ABNORMAL LOW (ref 36.0–46.0)
HEMOGLOBIN: 11.8 g/dL — AB (ref 12.0–15.0)
MCHC: 34.1 g/dL (ref 30.0–36.0)
MCV: 93.5 fl (ref 78.0–100.0)
PLATELETS: 335 10*3/uL (ref 150.0–400.0)
RBC: 3.7 Mil/uL — AB (ref 3.87–5.11)
RDW: 15.3 % (ref 11.5–15.5)
WBC: 7.7 10*3/uL (ref 4.0–10.5)

## 2015-10-08 LAB — VITAMIN D 25 HYDROXY (VIT D DEFICIENCY, FRACTURES): VITD: 82.23 ng/mL (ref 30.00–100.00)

## 2015-10-08 MED ORDER — AMLODIPINE BESYLATE 10 MG PO TABS: 10.0000 mg | ORAL_TABLET | Freq: Every day | ORAL | Status: DC

## 2015-10-08 MED ORDER — PRIMIDONE 50 MG PO TABS: 100.0000 mg | ORAL_TABLET | Freq: Two times a day (BID) | ORAL | Status: DC

## 2015-10-08 MED ORDER — DONEPEZIL HCL 5 MG PO TABS: 5.0000 mg | ORAL_TABLET | ORAL | Status: DC

## 2015-10-08 NOTE — Patient Instructions (Addendum)
Complete lab work prior to leaving today. I will notify you of your results once received.   Ensure you are walking outside at least 3 times daily.  Increase consumption of water to 6 cups daily as discussed.  Elevate your leg when you are at home resting.   Start taking the Aricept in the morning, not at bedtime as this may help to improve your sleep.  We've increased your Primidone tablets for tremors. Take 100 mg (2 tablets) by mouth twice daily.  Purchase Debrox drops to help prevent ear wax buildup. This may be purchased over the counter. Follow the package instructions.  Follow up in 6 months for re-evaluation or sooner if needed.   It was a pleasure to see you today!

## 2015-10-08 NOTE — Assessment & Plan Note (Signed)
Diagnosed during hospitalization in January, initiated on Aricept then. Will switch to day time dosing as I fear this is causing insomnia. Will also administer MMSE next visit. Daughter does endorse confusion throughout the day.

## 2015-10-08 NOTE — Progress Notes (Signed)
Subjective:    Patient ID: Nicole Downs, female    DOB: 1924/04/28, 82 y.o.   MRN: [id number]  HPI  Nicole Downs is a 82 year old female who presents today with multiple complaints. Her daughter is worried as she believes her mother is taking too many OTC supplements. The supplements were recommended by her mother's sister for which she started in March 2017. The patient lives with her daughter and son-in-law.  1) Lower Extremity Edema: Present intermittently for months, located to left lower extremity, edema worse since early to mid March 2017. She continues to have skin tears with any mild trauma. Her daughter has been watching these skin tears and is working to prevent infection. She was evaluated in February at [facility] for lower extremity edema without evidence of DVT. She has follow up scheduled with vascular in May. She is sedentary at home and is not elevating her extremities when at rest. Her edema will be improved in the morning when she wakes. Denies cough, recent trauma, calf pain, shortness of breath.  2) Ear Fullness: Located to the right ear. She wears a hearing aid to the right side and had this recently tested. The clinician noted her right ear to be full of wax. Denies pain, fevers, chills.   3) Essential Tremor: Chronic for years, although worse over the past 1 month. Currently on Primidone for tremors. She's noticed her tremors are worse when she's not active and endorses a more sedentary life recently.   4) Insomnia: Present for years, worse since January 2017. She's taking Aricept at bedtime. She's never taken anything for sleep in the past. She has no difficulty falling asleep, but will wake multiple times during the night.   Review of Systems  Constitutional: Negative for fever.  HENT: Negative for ear pain, sinus pressure and sore throat.        Ear fullness  Respiratory: Negative for cough and shortness of breath.   Cardiovascular: Positive for leg swelling.  Negative for chest pain.  Skin:       2 new skin tears  Neurological: Positive for tremors. Negative for dizziness.  Psychiatric/Behavioral: Positive for sleep disturbance. The patient is not nervous/anxious.        Denies depression       Past Medical History  Diagnosis Date  . Arthritis   . Glaucoma   . Hypertension   . Hypothyroidism   . Acute confusion   . Poor peripheral circulation ([place])   . Hyponatremia      Social History   Social History  . Marital Status: Single    Spouse Name: N/A  . Number of Children: N/A  . Years of Education: N/A   Occupational History  . Not on file.   Social History Main Topics  . Smoking status: Never Smoker   . Smokeless tobacco: Not on file  . Alcohol Use: No  . Drug Use: Not on file  . Sexual Activity: Not on file   Other Topics Concern  . Not on file   Social History Narrative   Lives with daughter, recent.       Past Surgical History  Procedure Laterality Date  . Breast biopsy      No family history on file.  No Known Allergies  Current Outpatient Prescriptions on File Prior to Visit  Medication Sig Dispense Refill  . Calcium Carbonate (CALCIUM 600 PO) Take 600 mg by mouth 2 (two) times daily.    . Cholecalciferol (VITAMIN D3)  10000 units TABS Take 1,000 mg by mouth 2 (two) times daily.    . IRON PO Take 65 mg by mouth daily with lunch.    . levobunolol (BETAGAN) 0.5 % ophthalmic solution Place 1 drop into both eyes 2 (two) times daily. 5 mL 3  . levothyroxine (SYNTHROID, LEVOTHROID) 25 MCG tablet Take 1 tablet (25 mcg total) by mouth daily before breakfast. 90 tablet 1  . lisinopril (PRINIVIL,ZESTRIL) 20 MG tablet Take 1 tablet (20 mg total) by mouth daily. 90 tablet 1  . niacin 250 MG tablet Take 250 mg by mouth daily.    Marland Kitchen POTASSIUM CITRATE PO Take by mouth daily.    . vitamin E 400 UNIT capsule Take 400 Units by mouth daily.     No current facility-administered medications on file prior to visit.    BP  124/76 mmHg  Pulse 82  Temp(Src) 98.2 F (36.8 C) (Oral)  Ht 4' 11.75" (1.518 m)  Wt 108 lb 1.9 oz (49.043 kg)  BMI 21.28 kg/m2  SpO2 97%    Objective:   Physical Exam  Constitutional: She appears well-nourished.  Cardiovascular: Normal rate and regular rhythm.   Pulses:      Dorsalis pedis pulses are 2+ on the right side, and 2+ on the left side.       Posterior tibial pulses are 2+ on the right side, and 2+ on the left side.  Good pedal pulses today. Blue discoloration to feet bilaterally, although appears improved.  Pulmonary/Chest: Effort normal and breath sounds normal.  Skin: Skin is warm.  1 healing skin tear to left dorsal foot, 1 new skin tear proximal to left ankle. No s/s of infection          Assessment & Plan:  Multiple OTC supplements:  On numerous supplements since last visit that I suspect are not needed. Will obtain CBC, CMP today to ensure levels are stable. Will have her eliminate supplements if possible.  Ear Fullness:  Present for several weeks, wears hearing aid to right ear. Moderate impaction of cerumen to right canal. Irrigation preformed. Canal and TM post irrigation WNL. Discussed use of Debrox drops.

## 2015-10-08 NOTE — Progress Notes (Signed)
Pre visit review using our clinic review tool, if applicable. No additional management support is needed unless otherwise documented below in the visit note. 

## 2015-10-08 NOTE — Assessment & Plan Note (Signed)
Left sided. Discussed importance of elevation, regular activity. No cough, SOB. Will continue to monitor.

## 2015-10-08 NOTE — Assessment & Plan Note (Signed)
Due again for evaluation per vascular in May. Good pedal pulses today, discoloration appears improved. Mild edema noted.

## 2015-10-08 NOTE — Assessment & Plan Note (Signed)
Stable today, continue amlodipine and lisinopril. CMP pending.

## 2015-10-12 LAB — IBC PANEL
Iron: 84 ug/dL (ref 42–145)
Saturation Ratios: 23.7 % (ref 20.0–50.0)
Transferrin: 253 mg/dL (ref 212.0–360.0)

## 2015-10-18 DIAGNOSIS — L97929 Non-pressure chronic ulcer of unspecified part of left lower leg with unspecified severity: Secondary | ICD-10-CM | POA: Diagnosis not present

## 2015-10-18 DIAGNOSIS — I10 Essential (primary) hypertension: Secondary | ICD-10-CM | POA: Diagnosis not present

## 2015-10-18 DIAGNOSIS — E119 Type 2 diabetes mellitus without complications: Secondary | ICD-10-CM | POA: Diagnosis not present

## 2015-10-18 DIAGNOSIS — L97421 Non-pressure chronic ulcer of left heel and midfoot limited to breakdown of skin: Secondary | ICD-10-CM | POA: Diagnosis not present

## 2015-10-18 DIAGNOSIS — I890 Lymphedema, not elsewhere classified: Secondary | ICD-10-CM | POA: Diagnosis not present

## 2015-10-18 DIAGNOSIS — L97321 Non-pressure chronic ulcer of left ankle limited to breakdown of skin: Secondary | ICD-10-CM | POA: Diagnosis not present

## 2015-10-18 DIAGNOSIS — M79609 Pain in unspecified limb: Secondary | ICD-10-CM | POA: Diagnosis not present

## 2015-10-18 DIAGNOSIS — M7989 Other specified soft tissue disorders: Secondary | ICD-10-CM | POA: Diagnosis not present

## 2015-10-25 ENCOUNTER — Telehealth: Payer: Self-pay | Admitting: Primary Care

## 2015-10-25 NOTE — Telephone Encounter (Signed)
Patient's daughter,Wanda,called.  She said she was called with lab results and was told another test was ordered for anemia and that she would be called back with the results.  [name] said it's been over 2 weeks and she hasn't heard about the results of the additional test.

## 2015-10-25 NOTE — Telephone Encounter (Signed)
Called and notified patient's daughter of Kate's comments. Patient's daughter verbalized understanding. 

## 2015-11-01 DIAGNOSIS — L570 Actinic keratosis: Secondary | ICD-10-CM | POA: Diagnosis not present

## 2015-11-01 DIAGNOSIS — C4491 Basal cell carcinoma of skin, unspecified: Secondary | ICD-10-CM

## 2015-11-01 DIAGNOSIS — L578 Other skin changes due to chronic exposure to nonionizing radiation: Secondary | ICD-10-CM | POA: Diagnosis not present

## 2015-11-01 DIAGNOSIS — D485 Neoplasm of uncertain behavior of skin: Secondary | ICD-10-CM | POA: Diagnosis not present

## 2015-11-01 DIAGNOSIS — C44719 Basal cell carcinoma of skin of left lower limb, including hip: Secondary | ICD-10-CM | POA: Diagnosis not present

## 2015-11-01 DIAGNOSIS — D0439 Carcinoma in situ of skin of other parts of face: Secondary | ICD-10-CM | POA: Diagnosis not present

## 2015-11-01 HISTORY — DX: Basal cell carcinoma of skin, unspecified: C44.91

## 2015-12-13 DIAGNOSIS — C44719 Basal cell carcinoma of skin of left lower limb, including hip: Secondary | ICD-10-CM | POA: Diagnosis not present

## 2015-12-13 DIAGNOSIS — L570 Actinic keratosis: Secondary | ICD-10-CM | POA: Diagnosis not present

## 2015-12-13 DIAGNOSIS — L578 Other skin changes due to chronic exposure to nonionizing radiation: Secondary | ICD-10-CM | POA: Diagnosis not present

## 2015-12-15 ENCOUNTER — Other Ambulatory Visit: Payer: Self-pay | Admitting: Primary Care

## 2016-01-14 ENCOUNTER — Other Ambulatory Visit: Payer: Self-pay | Admitting: Primary Care

## 2016-01-14 ENCOUNTER — Telehealth: Payer: Self-pay | Admitting: Primary Care

## 2016-01-14 DIAGNOSIS — G250 Essential tremor: Secondary | ICD-10-CM

## 2016-01-14 MED ORDER — PRIMIDONE 50 MG PO TABS: 100.0000 mg | ORAL_TABLET | Freq: Two times a day (BID) | ORAL | 0 refills | Status: DC

## 2016-01-14 NOTE — Telephone Encounter (Signed)
Noted. Will address during visit in October.

## 2016-01-14 NOTE — Telephone Encounter (Signed)
Ok to refill? Electronically refill request for primidone (MYSOLINE) 50 MG tablet. Last prescribed on 06/25/2015. Last seen on 10/08/2015. Follow up on 04/10/2016.

## 2016-01-14 NOTE — Telephone Encounter (Signed)
Patient's daughter [name]) called back. She said that patient is still taking Primidone for the tremors. She said when patient saw Anda Kraft last time, patient stated that the tremors got worse so Anda Kraft increased to taking 2 tablets 2 times daily.  Daughter also stated that don't know how long patient has been taking Primidone but it has been a long time.

## 2016-01-14 NOTE — Telephone Encounter (Signed)
Please verify if Nicole Downs is still taking her Primidone for the tremors. Does this help with her tremors? How long has she been taking this?

## 2016-01-14 NOTE — Telephone Encounter (Signed)
Message left for patient's daughter to return my call.

## 2016-01-18 ENCOUNTER — Other Ambulatory Visit: Payer: Self-pay | Admitting: Primary Care

## 2016-02-01 DIAGNOSIS — L821 Other seborrheic keratosis: Secondary | ICD-10-CM | POA: Diagnosis not present

## 2016-02-01 DIAGNOSIS — D0439 Carcinoma in situ of skin of other parts of face: Secondary | ICD-10-CM | POA: Diagnosis not present

## 2016-02-01 DIAGNOSIS — T07 Unspecified multiple injuries: Secondary | ICD-10-CM | POA: Diagnosis not present

## 2016-02-01 DIAGNOSIS — L570 Actinic keratosis: Secondary | ICD-10-CM | POA: Diagnosis not present

## 2016-02-01 DIAGNOSIS — Z85828 Personal history of other malignant neoplasm of skin: Secondary | ICD-10-CM | POA: Diagnosis not present

## 2016-02-01 DIAGNOSIS — L578 Other skin changes due to chronic exposure to nonionizing radiation: Secondary | ICD-10-CM | POA: Diagnosis not present

## 2016-02-01 DIAGNOSIS — D485 Neoplasm of uncertain behavior of skin: Secondary | ICD-10-CM | POA: Diagnosis not present

## 2016-03-03 ENCOUNTER — Other Ambulatory Visit: Payer: Self-pay | Admitting: Primary Care

## 2016-03-03 DIAGNOSIS — G250 Essential tremor: Secondary | ICD-10-CM

## 2016-03-22 ENCOUNTER — Telehealth: Payer: Self-pay

## 2016-03-22 NOTE — Telephone Encounter (Signed)
Noted  

## 2016-03-22 NOTE — Telephone Encounter (Signed)
Nicole Downs wants rx for light wt transport chair; pt already has appt on 04/10/16 to see Allie Bossier NP and wanda will request order for chair at that visit. FYI to Allie Bossier NP.

## 2016-04-10 ENCOUNTER — Encounter: Payer: Self-pay | Admitting: Primary Care

## 2016-04-10 ENCOUNTER — Ambulatory Visit (INDEPENDENT_AMBULATORY_CARE_PROVIDER_SITE_OTHER): Payer: Medicare Other | Admitting: Primary Care

## 2016-04-10 VITALS — BP 132/82 | HR 63 | Temp 98.0°F | Ht 60.0 in | Wt 111.0 lb

## 2016-04-10 DIAGNOSIS — F0390 Unspecified dementia without behavioral disturbance: Secondary | ICD-10-CM | POA: Diagnosis not present

## 2016-04-10 DIAGNOSIS — I10 Essential (primary) hypertension: Secondary | ICD-10-CM

## 2016-04-10 DIAGNOSIS — Z23 Encounter for immunization: Secondary | ICD-10-CM

## 2016-04-10 DIAGNOSIS — G250 Essential tremor: Secondary | ICD-10-CM | POA: Diagnosis not present

## 2016-04-10 MED ORDER — GABAPENTIN 100 MG PO CAPS: 100.0000 mg | ORAL_CAPSULE | Freq: Three times a day (TID) | ORAL | 1 refills | Status: DC

## 2016-04-10 NOTE — Assessment & Plan Note (Signed)
Managed on primidone for nearly 1 year. No improvement since increasing dose several months ago. We will slowly wean off and changed to low-dose gabapentin 3 times a day. Drowsiness precautions provided. Follow-up in 6 weeks for reevaluation. May need to increase dose.

## 2016-04-10 NOTE — Assessment & Plan Note (Signed)
Stable per daughter, continue Aricept 5 mg.

## 2016-04-10 NOTE — Patient Instructions (Signed)
We need to wean you off of your Primidone. Start by taking 1 tablet by mouth twice daily for 1 week, then 1 tablet by mouth once daily for 1 week, then stop.   Start Gabapentin 100 mg capsules. Take 1 capsule by mouth three times daily for tremor. Please notify me if you develop drowsiness.   Please follow up in 6 weeks for re-evaluation of your tremor.  It was a pleasure to see you today!

## 2016-04-10 NOTE — Assessment & Plan Note (Signed)
Stable in the office today, continue amlodipine 10 mg and lisinopril 20 mg.

## 2016-04-10 NOTE — Progress Notes (Signed)
Subjective:    Patient ID: Nicole Downs, female    DOB: 1924-03-08, 82 y.o.   MRN: [id number]  HPI  Nicole Downs is a 82 year old female who presents today for follow up.  1) Essential Hypertension: Currently managed on lisinopril 20 mg and Amlodipine 10 mg. Blood pressure in the office today is 132/82. She denies dizziness, chest pain, visual changes.  2) Hypothyroidism: Currently managed on levothyroxine 25 mcg. TSH in January 2017 stable. Denies fatigue, weakness, palpitations.  3) Essential Tremor: Currently managed on Primidone 100 mg BID. Strong family history of essential tremor. Over the past 3 weeks she's noticed an increase in tremor to her upper body including her hands, arms, head. She has never been managed on anything else other than Primidone. She has not noticed an improvement once the increase in her dose.  4) Dementia: Currently managed on Aricept 5 mg. her daughter denies changes in mental status. She does continue with repetitive questioning. Her daughter has not noticed much improvement since Aricept was initiated.   Her daughter is requesting a light weight transport wheelchair. She currently has a regular, non-transport wheelchair and this is very heavy and difficult to transport. She cannot walk longer than 10-15 minutes as she will experience generalized weakness and felt tired. No recent falls.   Review of Systems  Constitutional: Negative for fatigue.  Respiratory: Negative for shortness of breath.   Cardiovascular: Negative for chest pain.  Neurological: Negative for dizziness, weakness and headaches.  Psychiatric/Behavioral: Negative for behavioral problems and confusion. The patient is not nervous/anxious.        Baseline dementia, no acute changes       Past Medical History:  Diagnosis Date  . Acute confusion   . Arthritis   . Glaucoma   . Hypertension   . Hyponatremia   . Hypothyroidism   . Poor peripheral circulation [facility])      Social  History   Social History  . Marital status: Single    Spouse name: N/A  . Number of children: N/A  . Years of education: N/A   Occupational History  . Not on file.   Social History Main Topics  . Smoking status: Never Smoker  . Smokeless tobacco: Not on file  . Alcohol use No  . Drug use: Unknown  . Sexual activity: Not on file   Other Topics Concern  . Not on file   Social History Narrative   Lives with daughter, recent.       Past Surgical History:  Procedure Laterality Date  . BREAST BIOPSY      No family history on file.  No Known Allergies  Current Outpatient Prescriptions on File Prior to Visit  Medication Sig Dispense Refill  . amLODipine (NORVASC) 10 MG tablet Take 1 tablet (10 mg total) by mouth daily. 90 tablet 1  . Calcium Carbonate (CALCIUM 600 PO) Take 600 mg by mouth 2 (two) times daily.    . Cholecalciferol (VITAMIN D3) 10000 units TABS Take 1,000 mg by mouth 2 (two) times daily.    Marland Kitchen donepezil (ARICEPT) 5 MG tablet TAKE 1 TABLET (5 MG TOTAL) BY MOUTH AT BEDTIME. 90 tablet 1  . IRON PO Take 65 mg by mouth daily with lunch.    . levobunolol (BETAGAN) 0.5 % ophthalmic solution PLACE 1 DROP INTO BOTH EYES 2 (TWO) TIMES DAILY. 5 mL 5  . levothyroxine (SYNTHROID, LEVOTHROID) 25 MCG tablet TAKE 1 TABLET (25 MCG TOTAL) BY MOUTH DAILY BEFORE BREAKFAST.  90 tablet 1  . lisinopril (PRINIVIL,ZESTRIL) 20 MG tablet TAKE 1 TABLET (20 MG TOTAL) BY MOUTH DAILY. 90 tablet 1  . primidone (MYSOLINE) 50 MG tablet TAKE 2 TABLETS (100 MG TOTAL) BY MOUTH 2 (TWO) TIMES DAILY. 180 tablet 0   No current facility-administered medications on file prior to visit.     BP 132/82   Pulse 63   Temp 98 F (36.7 C) (Oral)   Ht 5' (1.524 m)   Wt 111 lb (50.3 kg)   SpO2 97%   BMI 21.68 kg/m    Objective:   Physical Exam  Constitutional: She is oriented to person, place, and time. She appears well-nourished.  Neck: Neck supple.  Cardiovascular: Normal rate and regular rhythm.    Pulmonary/Chest: Effort normal and breath sounds normal.  Neurological: She is alert and oriented to person, place, and time.  Skin: Skin is warm and dry.  Psychiatric: She has a normal mood and affect.          Assessment & Plan:  Need for transport wheelchair:  Daughter requesting light weight transport wheelchair weighing 15-20 pounds or less. Currently wheelchair bulky and heavy making it difficult for her to transport to the grocery store, etc. Agree that this is needed as patient will benefit from getting out of the house. Patient cannot walk longer than 10-15 minutes as she will tired easily. Given age this is expected. Will fax prescription for light weight transport wheelchair.  Sheral Flow, NP

## 2016-04-10 NOTE — Progress Notes (Signed)
Pre visit review using our clinic review tool, if applicable. No additional management support is needed unless otherwise documented below in the visit note. 

## 2016-04-14 ENCOUNTER — Telehealth: Payer: Self-pay | Admitting: *Deleted

## 2016-04-14 NOTE — Telephone Encounter (Signed)
Paperwork completed earlier this week and placed in Nicole Downs's inbox. Vallarie Mare, have you faxed this over? Once we have an update, please call daughter.

## 2016-04-14 NOTE — Telephone Encounter (Signed)
Pt's daughter left voicemail at Triage calling to check the status of the transport light weight wheelchair. Daughter said that she discussed this with Vallarie Mare and Allie Bossier, NP at her recent appt with them

## 2016-04-16 ENCOUNTER — Other Ambulatory Visit: Payer: Self-pay | Admitting: Primary Care

## 2016-04-16 DIAGNOSIS — G250 Essential tremor: Secondary | ICD-10-CM

## 2016-04-17 NOTE — Telephone Encounter (Signed)
Already faxed the paper work on 04/12/2016. Notified patient's daughter that it was faxed and to let us know if they need anything else.  Also patient's daughter wanted to know is there anything Nicole Downs would suggest for patient to take for gas. It is recently getting more often.

## 2016-04-17 NOTE — Telephone Encounter (Signed)
Ok to refill? Electronically refill request for   primidone (MYSOLINE) 50 MG tablet  Last prescribed on 03/03/2016. Last seen on 04/10/2016.

## 2016-04-17 NOTE — Telephone Encounter (Signed)
Generally I recommend reduction in gas producing foods such as cabbage, brussel sprouts, wheat, and potatoes to start. If she's consuming little of these foods, then something like Simethicone will help to break up gas bubbles in the abdomen to help with relief. Beano is also another option.

## 2016-04-17 NOTE — Telephone Encounter (Signed)
Patient's daughter [name]) notified as instructed by telephone and verbalized understanding.

## 2016-05-04 DIAGNOSIS — Z85828 Personal history of other malignant neoplasm of skin: Secondary | ICD-10-CM | POA: Diagnosis not present

## 2016-05-04 DIAGNOSIS — L570 Actinic keratosis: Secondary | ICD-10-CM | POA: Diagnosis not present

## 2016-05-04 DIAGNOSIS — D485 Neoplasm of uncertain behavior of skin: Secondary | ICD-10-CM | POA: Diagnosis not present

## 2016-05-04 DIAGNOSIS — L578 Other skin changes due to chronic exposure to nonionizing radiation: Secondary | ICD-10-CM | POA: Diagnosis not present

## 2016-05-04 DIAGNOSIS — L820 Inflamed seborrheic keratosis: Secondary | ICD-10-CM | POA: Diagnosis not present

## 2016-05-24 ENCOUNTER — Encounter: Payer: Self-pay | Admitting: Primary Care

## 2016-05-24 ENCOUNTER — Ambulatory Visit (INDEPENDENT_AMBULATORY_CARE_PROVIDER_SITE_OTHER): Payer: Medicare Other | Admitting: Primary Care

## 2016-05-24 VITALS — BP 124/76 | HR 76 | Temp 97.4°F | Ht 60.0 in | Wt 110.8 lb

## 2016-05-24 DIAGNOSIS — G250 Essential tremor: Secondary | ICD-10-CM

## 2016-05-24 MED ORDER — GABAPENTIN 300 MG PO CAPS: 300.0000 mg | ORAL_CAPSULE | Freq: Three times a day (TID) | ORAL | 1 refills | Status: DC

## 2016-05-24 NOTE — Progress Notes (Signed)
Subjective:    Patient ID: Nicole Downs, female    DOB: [birth year], 82 y.o.   MRN: [id number]  HPI  Nicole Downs is a 82 year old female who presents today for follow up of essential tremor. Currently managed on Gabapentin 100 mg three times daily. She was previously managed on Primidone and was taking 200 mg twice daily without much improvement. During her last visit she was switched from primidone to gabapentin given lack of improvement.  Since her last visit she's not noticed any improvement on gabapentin. She continues to experience a resting essential tremor that improves when sleeping at night. She is embarrassed by her tremor. The gabapentin did not cause any drowsiness, weakness, falls.  Review of Systems  Constitutional: Negative for fatigue.  Eyes: Negative for visual disturbance.  Neurological: Positive for tremors. Negative for dizziness and weakness.       Past Medical History:  Diagnosis Date  . Acute confusion   . Arthritis   . Glaucoma   . Hypertension   . Hyponatremia   . Hypothyroidism   . Poor peripheral circulation [facility])      Social History   Social History  . Marital status: Single    Spouse name: N/A  . Number of children: N/A  . Years of education: N/A   Occupational History  . Not on file.   Social History Main Topics  . Smoking status: Never Smoker  . Smokeless tobacco: Not on file  . Alcohol use No  . Drug use: Unknown  . Sexual activity: Not on file   Other Topics Concern  . Not on file   Social History Narrative   Lives with daughter, recent.       Past Surgical History:  Procedure Laterality Date  . BREAST BIOPSY      No family history on file.  No Known Allergies  Current Outpatient Prescriptions on File Prior to Visit  Medication Sig Dispense Refill  . amLODipine (NORVASC) 10 MG tablet Take 1 tablet (10 mg total) by mouth daily. 90 tablet 1  . Calcium Carbonate (CALCIUM 600 PO) Take 600 mg by mouth 2 (two) times  daily.    . Cholecalciferol (VITAMIN D3) 10000 units TABS Take 1,000 mg by mouth 2 (two) times daily.    Marland Kitchen donepezil (ARICEPT) 5 MG tablet TAKE 1 TABLET (5 MG TOTAL) BY MOUTH AT BEDTIME. 90 tablet 1  . IRON PO Take 65 mg by mouth daily with lunch.    . levobunolol (BETAGAN) 0.5 % ophthalmic solution PLACE 1 DROP INTO BOTH EYES 2 (TWO) TIMES DAILY. 5 mL 5  . levothyroxine (SYNTHROID, LEVOTHROID) 25 MCG tablet TAKE 1 TABLET (25 MCG TOTAL) BY MOUTH DAILY BEFORE BREAKFAST. 90 tablet 1  . lisinopril (PRINIVIL,ZESTRIL) 20 MG tablet TAKE 1 TABLET (20 MG TOTAL) BY MOUTH DAILY. 90 tablet 1  . primidone (MYSOLINE) 50 MG tablet TAKE 2 TABLETS (100 MG TOTAL) BY MOUTH 2 (TWO) TIMES DAILY. 180 tablet 0   No current facility-administered medications on file prior to visit.     BP 124/76   Pulse 76   Temp 97.4 F (36.3 C) (Oral)   Ht 5' (1.524 m)   Wt 110 lb 12.8 oz (50.3 kg)   SpO2 99%   BMI 21.64 kg/m    Objective:   Physical Exam  Constitutional: She is oriented to person, place, and time. She appears well-nourished.  Cardiovascular: Normal rate and regular rhythm.   Pulmonary/Chest: Effort normal and breath sounds normal.  Neurological: She is alert and oriented to person, place, and time.  Moderate resting essential tremor to head and upper extremities.  Skin: Skin is warm and dry.          Assessment & Plan:

## 2016-05-24 NOTE — Assessment & Plan Note (Signed)
No improvement on gabapentin. Suspect this could be due to the low dose. Long discussion today regarding treatment options. Discussed that regardless of any treatment option her tremor would not completely dissipate.   Will increase gabapentin to 300 mg 3 times daily as it did not cause drowsiness before. If no improvement consider increasing to 900 mg 3 times daily or switching back to primidone and adding in low-dose propanolol.  Will call in 4 weeks to see if any improvement on increased gabapentin dose.

## 2016-05-24 NOTE — Patient Instructions (Addendum)
We've increased your Gabapentin from 100 mg three times daily to 300 mg three times daily. I sent a prescription for Gabapentin 300 mg capsules. Take 1 capsule by mouth three times daily.  We will call you in 3-4 weeks for an update.  It was a pleasure to see you today!

## 2016-05-24 NOTE — Progress Notes (Signed)
Pre visit review using our clinic review tool, if applicable. No additional management support is needed unless otherwise documented below in the visit note. 

## 2016-05-31 ENCOUNTER — Other Ambulatory Visit: Payer: Self-pay | Admitting: Primary Care

## 2016-06-05 ENCOUNTER — Other Ambulatory Visit: Payer: Self-pay | Admitting: Primary Care

## 2016-06-05 DIAGNOSIS — G250 Essential tremor: Secondary | ICD-10-CM

## 2016-06-07 ENCOUNTER — Telehealth: Payer: Self-pay | Admitting: Primary Care

## 2016-06-07 DIAGNOSIS — G250 Essential tremor: Secondary | ICD-10-CM

## 2016-06-07 NOTE — Telephone Encounter (Signed)
Please notify patient and her daughter that I think it's time she see a neurologist for further evaluation as this is getting out of my scope of practice. If agreeable, I will place the referral.

## 2016-06-07 NOTE — Telephone Encounter (Signed)
-----   Message from Pleas Koch, NP sent at 05/24/2016  3:05 PM EST ----- Regarding: Tremor Please check on Nicole Downs. Any improvement in her tremor since we increased her Gabapentin to 300 mg three times daily.

## 2016-06-07 NOTE — Telephone Encounter (Signed)
Spoken to patient's daughter and she stated that she did not noticed any difference at all.

## 2016-06-08 NOTE — Telephone Encounter (Signed)
Message left for patient's daughter to return my call.

## 2016-06-08 NOTE — Telephone Encounter (Signed)
Patient's daughter,Wanda,returned Chan's call.  [name] can be reached at [phone].

## 2016-06-08 NOTE — Telephone Encounter (Signed)
Spoken and notified patient's daughter of Kate's comments. Patient's daughter verbalized understanding. 

## 2016-07-04 ENCOUNTER — Other Ambulatory Visit: Payer: Self-pay | Admitting: Primary Care

## 2016-07-04 DIAGNOSIS — I10 Essential (primary) hypertension: Secondary | ICD-10-CM

## 2016-07-20 NOTE — Addendum Note (Signed)
Addended by: Pleas Koch on: 07/20/2016 01:44 PM   Modules accepted: Orders

## 2016-07-20 NOTE — Telephone Encounter (Signed)
Spoke with pt's daughter Cory Munch.  She would like to get the referral to [place] asap for her mother.  Best number to call is [phone]

## 2016-07-20 NOTE — Telephone Encounter (Addendum)
I don't think this message was ever routed to me. Please explain that I never received this message and that I've placed a referral for neurology. Is she taking anything now for her tremor? Gabapentin or Primidone? Would she like to try the Primidone again?

## 2016-07-21 NOTE — Telephone Encounter (Signed)
Spoken to patient's daughter and apologized. She stated that patient currently taking gabapentin. Patient's daughter stated she does not patient to try anything else. Will wait for the neurologist appoint on 08/09/16

## 2016-07-21 NOTE — Telephone Encounter (Signed)
Noted  

## 2016-08-07 DIAGNOSIS — Z85828 Personal history of other malignant neoplasm of skin: Secondary | ICD-10-CM | POA: Diagnosis not present

## 2016-08-07 DIAGNOSIS — L570 Actinic keratosis: Secondary | ICD-10-CM | POA: Diagnosis not present

## 2016-08-07 DIAGNOSIS — L578 Other skin changes due to chronic exposure to nonionizing radiation: Secondary | ICD-10-CM | POA: Diagnosis not present

## 2016-08-07 DIAGNOSIS — L821 Other seborrheic keratosis: Secondary | ICD-10-CM | POA: Diagnosis not present

## 2016-08-09 ENCOUNTER — Encounter: Payer: Self-pay | Admitting: Neurology

## 2016-08-09 ENCOUNTER — Ambulatory Visit (INDEPENDENT_AMBULATORY_CARE_PROVIDER_SITE_OTHER): Payer: Medicare Other | Admitting: Neurology

## 2016-08-09 VITALS — BP 123/64 | HR 85 | Ht 60.0 in | Wt 112.0 lb

## 2016-08-09 DIAGNOSIS — G250 Essential tremor: Secondary | ICD-10-CM

## 2016-08-09 DIAGNOSIS — E039 Hypothyroidism, unspecified: Secondary | ICD-10-CM | POA: Diagnosis not present

## 2016-08-09 DIAGNOSIS — R413 Other amnesia: Secondary | ICD-10-CM | POA: Diagnosis not present

## 2016-08-09 MED ORDER — PRIMIDONE 50 MG PO TABS: 100.0000 mg | ORAL_TABLET | Freq: Two times a day (BID) | ORAL | 11 refills | Status: DC

## 2016-08-09 NOTE — Progress Notes (Signed)
PATIENT: Nicole Downs DOB: 1924/01/05  Chief Complaint  Patient presents with  . Tremors    She is here with her daughter, Nicole Downs, for worsening tremors in her head and bilateral hands.  She was on primidone 100mg , BID without much improvement.  Her therapy was changed to gabapentin 300mg , TID but this has not been very helpful either.  They would like to discuss other treatment options.  . Memory Loss    Says memory loss is mild and treated by her PCP, who prescribes Aricept.  Marland Kitchen PCP    Pleas Koch, NP     HISTORICAL  Nicole Downs is a 83 years old right-handed female, is accompanied by her daughter Nicole Downs, seen in refer by her primary care nurse practitioner Pleas Koch for evaluation of tremor, memory loss. Initial evaluation was on August 09 2016.  I reviewed and summarized the referring note, she had a history of hypertension, memory loss, has been taking donepezil since December 2016,  She had a gradual onset bilateral hands tremor and head titubation over the past few years, she was diagnosed with essential tremor, was put on primidone 50 mg 2 tablets twice a day by her previous neurologist before she moved in with her daughter since January 2017. She reported no significant improvement with primidone,  so it was stopped, she was put on gabapentin 100 mg 3 times a day, it did not help her symptoms, looking back, she realized that pramadol was somewhat helpful, there was no significant side effect noticed.  Her sister had similar tremor, her daughter now develop mild bilateral hands shaking, patient only has mild hand shaking, the most bothersome symptoms is head titubation, she has almost constant no-no head movement, cause social embarrassment, but there is no significant functional limitation. She spent most of her time sitting in a recliner watching TV.  Her daughter is also concerned about her memory loss, she used to work at [employer], retired  around 10/05/2066, her husband passed away in 10/05/1999, she lived alone, was noted to have gradual onset memory loss since 2014/10/05, she fell on Dec2016, had UTI, was not able to get up from floor, she now moved in with her daughter.  She sleeps well, has good appetite, no significant gait abnormality  I reviewed her laboratory evaluation, normal TSH albumin was mildly low at 3.2, CBC showed low normal hemoglobin 11.8, normal CMP, vitamin D 82,   CT head in Dec 2016: no acute abnormality.  REVIEW OF SYSTEMS: Full 14 system review of systems performed and notable only for as above  ALLERGIES: No Known Allergies  HOME MEDICATIONS: Current Outpatient Prescriptions  Medication Sig Dispense Refill  . amLODipine (NORVASC) 10 MG tablet TAKE 1 TABLET (10 MG TOTAL) BY MOUTH DAILY. 90 tablet 1  . Calcium Carbonate (CALCIUM 600 PO) Take 600 mg by mouth 2 (two) times daily.    . Cholecalciferol (VITAMIN D3) 10000 units TABS Take 1,000 mg by mouth 2 (two) times daily.    Marland Kitchen donepezil (ARICEPT) 5 MG tablet TAKE 1 TABLET (5 MG TOTAL) BY MOUTH AT BEDTIME. 90 tablet 1  . gabapentin (NEURONTIN) 300 MG capsule Take 1 capsule (300 mg total) by mouth 3 (three) times daily. 270 capsule 1  . IRON PO Take 65 mg by mouth daily with lunch.    . levobunolol (BETAGAN) 0.5 % ophthalmic solution PLACE 1 DROP INTO BOTH EYES 2 (TWO) TIMES DAILY. 5 mL 2  . levothyroxine (SYNTHROID, LEVOTHROID) 25 MCG  tablet TAKE 1 TABLET (25 MCG TOTAL) BY MOUTH DAILY BEFORE BREAKFAST. 90 tablet 0  . lisinopril (PRINIVIL,ZESTRIL) 20 MG tablet TAKE 1 TABLET (20 MG TOTAL) BY MOUTH DAILY. 90 tablet 1   No current facility-administered medications for this visit.     PAST MEDICAL HISTORY: Past Medical History:  Diagnosis Date  . Acute confusion   . Arthritis   . Emphysema lung ([place])   . Glaucoma   . Hypertension   . Hyponatremia   . Hypothyroidism   . Memory loss   . Poor peripheral circulation ([place])   . Tremors of nervous system     PAST  SURGICAL HISTORY: Past Surgical History:  Procedure Laterality Date  . BREAST BIOPSY      FAMILY HISTORY: Family History  Problem Relation Age of Onset  . Heart attack Mother   . Other Father     unsure - she was only 38 years old when he died    SOCIAL HISTORY:  Social History   Social History  . Marital status: Single    Spouse name: N/A  . Number of children: 1  . Years of education: 11th grade   Occupational History  . Retired    Social History Main Topics  . Smoking status: Never Smoker  . Smokeless tobacco: Never Used  . Alcohol use No  . Drug use: No  . Sexual activity: Not on file   Other Topics Concern  . Not on file   Social History Narrative   Lives with daughter and son-in-law.   Right-handed.   Drinks one soda per day.        PHYSICAL EXAM   Vitals:   08/09/16 1134  BP: 123/64  Pulse: 85  Weight: 112 lb (50.8 kg)  Height: 5' (1.524 m)    Not recorded      Body mass index is 21.87 kg/m.  PHYSICAL EXAMNIATION:  Gen: NAD, conversant, well nourised, obese, well groomed                     Cardiovascular: Regular rate rhythm, no peripheral edema, warm, nontender. Eyes: Conjunctivae clear without exudates or hemorrhage Neck: Supple, no carotid bruits. Pulmonary: Clear to auscultation bilaterally   NEUROLOGICAL EXAM:  MENTAL STATUS: Speech:    Speech is normal; fluent and spontaneous with normal comprehension.  Cognition: depend on her daughter to answer questions.     Orientation to time, place and person     Normal recent and remote memory     Normal Attention span and concentration     Normal Language, naming, repeating,spontaneous speech     Fund of knowledge   CRANIAL NERVES: CN II: Visual fields are full to confrontation. Fundoscopic exam is normal with sharp discs and no vascular changes. Pupils are round equal and briskly reactive to light. CN III, IV, VI: extraocular movement are normal. No ptosis. CN V: Facial  sensation is intact to pinprick in all 3 divisions bilaterally. Corneal responses are intact.  CN VII: Face is symmetric with normal eye closure and smile. CN VIII: Hearing is normal to rubbing fingers CN IX, X: Palate elevates symmetrically. Phonation is normal. CN XI: Head turning and shoulder shrug are intact CN XII: Tongue is midline with normal movements and no atrophy.  MOTOR: There is no pronator drift of out-stretched arms. Muscle bulk and tone are normal. Muscle strength is normal.  REFLEXES: Reflexes are 2+ and symmetric at the biceps, triceps, knees, and ankles. Plantar responses  are flexor.  SENSORY: Intact to light touch, pinprick, positional sensation and vibratory sensation are intact in fingers and toes.  COORDINATION: Rapid alternating movements and fine finger movements are intact. There is no dysmetria on finger-to-nose and heel-knee-shin.    GAIT/STANCE: Need push up to get up from seated position, cautious mildly unsteady   DIAGNOSTIC DATA (LABS, IMAGING, TESTING) - I reviewed patient records, labs, notes, testing and imaging myself where available.   ASSESSMENT AND PLAN  Aniece Marrin is a 83 y.o. female   Essential tremor:  Almost constant no-no head titubation  TSH  Restart primidone, titrating to 50mg  ii bid, option also include EMG guided low dose botox injection Mild cognitive impairment.  B12, TSH.   Marcial Pacas, M.D. Ph.D.  [facility] [address] [place], [place] [address] Ph: [phone] Fax: [phone]  CC: Pleas Koch, NP

## 2016-08-10 ENCOUNTER — Telehealth: Payer: Self-pay | Admitting: *Deleted

## 2016-08-10 LAB — VITAMIN B12: VITAMIN B 12: 356 pg/mL (ref 232–1245)

## 2016-08-10 LAB — TSH: TSH: 4.56 u[IU]/mL — ABNORMAL HIGH (ref 0.450–4.500)

## 2016-08-10 NOTE — Telephone Encounter (Signed)
Per Dr Krista Blue, spoke with patient's daughter,POA, [name] and informed her that her mother's labs showed  normal vitamin B12, and slightly elevated TSH. Advised her that Dr Krista Blue may consider doing a repeat laboratory evaluation at her next follow-up visit.  [name] verbalized understanding, appreciation of call.

## 2016-08-21 ENCOUNTER — Other Ambulatory Visit: Payer: Self-pay | Admitting: Primary Care

## 2016-08-27 ENCOUNTER — Other Ambulatory Visit: Payer: Self-pay | Admitting: Primary Care

## 2016-11-13 ENCOUNTER — Other Ambulatory Visit: Payer: Self-pay | Admitting: Primary Care

## 2016-11-15 DIAGNOSIS — H2511 Age-related nuclear cataract, right eye: Secondary | ICD-10-CM | POA: Diagnosis not present

## 2016-11-16 ENCOUNTER — Ambulatory Visit: Payer: Medicare Other | Admitting: Neurology

## 2016-11-20 ENCOUNTER — Other Ambulatory Visit: Payer: Self-pay | Admitting: Primary Care

## 2016-11-30 ENCOUNTER — Ambulatory Visit (INDEPENDENT_AMBULATORY_CARE_PROVIDER_SITE_OTHER): Payer: Medicare Other | Admitting: Neurology

## 2016-11-30 ENCOUNTER — Encounter: Payer: Self-pay | Admitting: Neurology

## 2016-11-30 ENCOUNTER — Encounter (INDEPENDENT_AMBULATORY_CARE_PROVIDER_SITE_OTHER): Payer: Self-pay

## 2016-11-30 VITALS — BP 159/77 | HR 70 | Ht 59.0 in | Wt 114.5 lb

## 2016-11-30 DIAGNOSIS — G250 Essential tremor: Secondary | ICD-10-CM | POA: Diagnosis not present

## 2016-11-30 DIAGNOSIS — E039 Hypothyroidism, unspecified: Secondary | ICD-10-CM | POA: Diagnosis not present

## 2016-11-30 NOTE — Progress Notes (Signed)
PATIENT: Nicole Downs DOB: [birth year]  Chief Complaint  Patient presents with  . Rm 4    Daughter, Nicole Downs  . Follow-up    3 mo f/u  . Tremors    Doing well with primidone, up to 100 mg BID, but feels that tremor remains about the same.     HISTORICAL  Nicole Downs is a 83 years old right-handed female, is accompanied by her daughter Nicole Downs, seen in refer by her primary care nurse practitioner Pleas Koch for evaluation of tremor, memory loss. Initial evaluation was on August 09 2016.  I reviewed and summarized the referring note, she had a history of hypertension, memory loss, has been taking donepezil since December 2016,  She had a gradual onset bilateral hands tremor and head titubation over the past few years, she was diagnosed with essential tremor, was put on primidone 50 mg 2 tablets twice a day by her previous neurologist before she moved in with her daughter since January 2017. She reported no significant improvement with primidone,  so it was stopped, she was put on gabapentin 100 mg 3 times a day, it did not help her symptoms, looking back, she realized that pramadol was somewhat helpful, there was no significant side effect noticed.  Her sister had similar tremor, her daughter now develop mild bilateral hands shaking, patient only has mild hand shaking, the most bothersome symptoms is head titubation, she has almost constant no-no head movement, cause social embarrassment, but there is no significant functional limitation. She spent most of her time sitting in a recliner watching TV.  Her daughter is also concerned about her memory loss, she used to work at [employer], retired around 10-09-66, her husband passed away in 10/09/1999, she lived alone, was noted to have gradual onset memory loss since 2014/10/09, she fell on Dec2016, had UTI, was not able to get up from floor, she now moved in with her daughter.  She sleeps well, has good appetite, no significant gait  abnormality  I reviewed her laboratory evaluation, normal TSH albumin was mildly low at 3.2, CBC showed low normal hemoglobin 11.8, normal CMP, vitamin D 82,   CT head in Dec 2016: no acute abnormality.  UPDATE November 30 2016: She is accompanied by her daughter at today's clinical visit, higher dose of primidone 50 mg 2 tablets twice a day did not help her symptoms, she continue have significant tremor, mainly head titubation, loud hands tremor, Laboratory in February 2018, elevated TSH 4.56, B12,  REVIEW OF SYSTEMS: Full 14 system review of systems performed and notable only for as above  ALLERGIES: No Known Allergies  HOME MEDICATIONS: Current Outpatient Prescriptions  Medication Sig Dispense Refill  . amLODipine (NORVASC) 10 MG tablet TAKE 1 TABLET (10 MG TOTAL) BY MOUTH DAILY. 90 tablet 1  . Calcium Carbonate (CALCIUM 600 PO) Take 600 mg by mouth 2 (two) times daily.    . Cholecalciferol (VITAMIN D3) 10000 units TABS Take 1,000 mg by mouth 2 (two) times daily.    Marland Kitchen donepezil (ARICEPT) 5 MG tablet TAKE 1 TABLET (5 MG TOTAL) BY MOUTH AT BEDTIME. 90 tablet 1  . IRON PO Take 65 mg by mouth daily with lunch.    . levobunolol (BETAGAN) 0.5 % ophthalmic solution PLACE 1 DROP INTO BOTH EYES 2 (TWO) TIMES DAILY. 5 mL 2  . levothyroxine (SYNTHROID, LEVOTHROID) 25 MCG tablet TAKE 1 TABLET (25 MCG TOTAL) BY MOUTH DAILY BEFORE BREAKFAST. 90 tablet 1  . lisinopril (PRINIVIL,ZESTRIL)  20 MG tablet TAKE 1 TABLET (20 MG TOTAL) BY MOUTH DAILY. 90 tablet 1  . primidone (MYSOLINE) 50 MG tablet Take 2 tablets (100 mg total) by mouth 2 (two) times daily. 120 tablet 11   No current facility-administered medications for this visit.     PAST MEDICAL HISTORY: Past Medical History:  Diagnosis Date  . Acute confusion   . Arthritis   . Emphysema lung ([place])   . Glaucoma   . Hypertension   . Hyponatremia   . Hypothyroidism   . Memory loss   . Poor peripheral circulation ([place])   . Tremors of nervous  system     PAST SURGICAL HISTORY: Past Surgical History:  Procedure Laterality Date  . BREAST BIOPSY      FAMILY HISTORY: Family History  Problem Relation Age of Onset  . Heart attack Mother   . Other Father        unsure - she was only 39 years old when he died    SOCIAL HISTORY:  Social History   Social History  . Marital status: Single    Spouse name: N/A  . Number of children: 1  . Years of education: 11th grade   Occupational History  . Retired    Social History Main Topics  . Smoking status: Never Smoker  . Smokeless tobacco: Never Used  . Alcohol use No  . Drug use: No  . Sexual activity: Not on file   Other Topics Concern  . Not on file   Social History Narrative   Lives with daughter and son-in-law.   Right-handed.   Drinks one soda per day.        PHYSICAL EXAM   Vitals:   11/30/16 1156  BP: (!) 159/77  Pulse: 70  Weight: 114 lb 8 oz (51.9 kg)  Height: 4\' 11"  (1.499 m)    Not recorded      Body mass index is 23.13 kg/m.  PHYSICAL EXAMNIATION:  Gen: NAD, conversant, well nourised, obese, well groomed                     Cardiovascular: Regular rate rhythm, no peripheral edema, warm, nontender. Eyes: Conjunctivae clear without exudates or hemorrhage Neck: Supple, no carotid bruits. Pulmonary: Clear to auscultation bilaterally   NEUROLOGICAL EXAM:  MENTAL STATUS: Speech:    Speech is normal; fluent and spontaneous with normal comprehension.  Cognition: depend on her daughter to answer questions.     Orientation to time, place and person     Normal recent and remote memory     Normal Attention span and concentration     Normal Language, naming, repeating,spontaneous speech     Fund of knowledge   CRANIAL NERVES: CN II: Visual fields are full to confrontation. Fundoscopic exam is normal with sharp discs and no vascular changes. Pupils are round equal and briskly reactive to light. CN III, IV, VI: extraocular movement are  normal. No ptosis. CN V: Facial sensation is intact to pinprick in all 3 divisions bilaterally. Corneal responses are intact.  CN VII: Face is symmetric with normal eye closure and smile. CN VIII: Hearing is normal to rubbing fingers CN IX, X: Palate elevates symmetrically. Phonation is normal. CN XI: Head turning and shoulder shrug are intact CN XII: Tongue is midline with normal movements and no atrophy.  MOTOR: She has frequent no no head titubation, mild hand posturing tremor, no rigidity, no weakness  REFLEXES: Reflexes are 2+ and symmetric at the  biceps, triceps, knees, and ankles. Plantar responses are flexor.  SENSORY: Intact to light touch, pinprick, positional sensation and vibratory sensation are intact in fingers and toes.  COORDINATION: Rapid alternating movements and fine finger movements are intact. There is no dysmetria on finger-to-nose and heel-knee-shin.    GAIT/STANCE: Need push up to get up from seated position, cautious mildly unsteady, kyphosis   DIAGNOSTIC DATA (LABS, IMAGING, TESTING) - I reviewed patient records, labs, notes, testing and imaging myself where available.   ASSESSMENT AND PLAN  Merci Walthers is a 83 y.o. female   Essential tremor:  Almost constant no-no head titubation  I have suggested botulism toxin injection, her daughter does not want to consider it at this point,  May continue low-dose of primidone   Mild cognitive impairment. Abnormal TSH  Repeat laboratory evaluations  Marcial Pacas, M.D. Ph.D.  [facility] [address], [place], [place] [address] Ph: [phone] Fax: [phone]  CC: Pleas Koch, NP

## 2016-11-30 NOTE — Patient Instructions (Signed)
You are taking primidone 50 mg 2 tablets twice a day  You should take 2 tabs every night for 2 weeks,  Then one tab every night for 2 weeks, Then stop

## 2016-12-01 LAB — THYROID PANEL WITH TSH
Free Thyroxine Index: 1.3 (ref 1.2–4.9)
T3 UPTAKE RATIO: 28 % (ref 24–39)
T4, Total: 4.5 ug/dL (ref 4.5–12.0)
TSH: 2.99 u[IU]/mL (ref 0.450–4.500)

## 2016-12-25 ENCOUNTER — Other Ambulatory Visit: Payer: Self-pay | Admitting: Primary Care

## 2016-12-25 DIAGNOSIS — I10 Essential (primary) hypertension: Secondary | ICD-10-CM

## 2017-02-06 ENCOUNTER — Other Ambulatory Visit: Payer: Self-pay | Admitting: Primary Care

## 2017-02-24 ENCOUNTER — Other Ambulatory Visit: Payer: Self-pay | Admitting: Primary Care

## 2017-03-07 ENCOUNTER — Other Ambulatory Visit: Payer: Self-pay | Admitting: Primary Care

## 2017-03-07 MED ORDER — LEVOBUNOLOL HCL 0.5 % OP SOLN: 1.0000 [drp] | Freq: Two times a day (BID) | OPHTHALMIC | 1 refills | Status: DC

## 2017-03-07 NOTE — Telephone Encounter (Signed)
Received faxed refill request for levobunolol (BETAGAN) 0.5 % ophthalmic solution for 90 days.

## 2017-03-29 ENCOUNTER — Ambulatory Visit (INDEPENDENT_AMBULATORY_CARE_PROVIDER_SITE_OTHER): Payer: Medicare Other | Admitting: Primary Care

## 2017-03-29 ENCOUNTER — Encounter: Payer: Self-pay | Admitting: Primary Care

## 2017-03-29 VITALS — BP 118/72 | HR 82 | Temp 98.2°F | Ht 59.0 in | Wt 117.4 lb

## 2017-03-29 DIAGNOSIS — I10 Essential (primary) hypertension: Secondary | ICD-10-CM | POA: Diagnosis not present

## 2017-03-29 DIAGNOSIS — J439 Emphysema, unspecified: Secondary | ICD-10-CM | POA: Insufficient documentation

## 2017-03-29 DIAGNOSIS — F0390 Unspecified dementia without behavioral disturbance: Secondary | ICD-10-CM | POA: Diagnosis not present

## 2017-03-29 DIAGNOSIS — G250 Essential tremor: Secondary | ICD-10-CM | POA: Diagnosis not present

## 2017-03-29 DIAGNOSIS — Z23 Encounter for immunization: Secondary | ICD-10-CM | POA: Diagnosis not present

## 2017-03-29 DIAGNOSIS — E039 Hypothyroidism, unspecified: Secondary | ICD-10-CM

## 2017-03-29 MED ORDER — PROPRANOLOL HCL 60 MG PO TABS: ORAL_TABLET | ORAL | 1 refills | Status: DC

## 2017-03-29 MED ORDER — ALBUTEROL SULFATE HFA 108 (90 BASE) MCG/ACT IN AERS: 2.0000 | INHALATION_SPRAY | Freq: Four times a day (QID) | RESPIRATORY_TRACT | 0 refills | Status: DC | PRN

## 2017-03-29 NOTE — Patient Instructions (Signed)
Hold the Lisinopril 20 mg tablets for high blood pressure. This may be causing your cough.  Start Propranolol 60 mg tablets for tremors. Start by taking 1 tablet once daily for 5 days then increase to 1 tablet twice daily thereafter.   Please monitor your blood pressure and heart rate and notify me of low blood pressure readings (<100/60) and low heart rate readings (<60).   Shortness of Breath/Wheezing/Cough: Use the albuterol inhaler. Inhale 2 puffs into the lungs every 6 hours as needed for wheezing and/or shortness of breath.   Please update me in 3-4 weeks regarding your tremors.   Schedule a follow up visit in 6 months.  It was a pleasure to see you today!

## 2017-03-29 NOTE — Assessment & Plan Note (Signed)
TSH in June 2018 unremarkable, continue levothyroxine 25 mcg.

## 2017-03-29 NOTE — Assessment & Plan Note (Signed)
Stable on donepezil, continue same.

## 2017-03-29 NOTE — Progress Notes (Signed)
Subjective:    Patient ID: Nicole Downs, female    DOB: 17-Apr-1924, 83 y.o.   MRN: [id number]  HPI  Nicole Downs is a 83 year old female who presents today for follow up.  1) Essential Hypertension: Currently managed on amlodipine 10 mg and lisinopril 20 mg. Her BP in the office today is 118/72. Her daughter has noticed a cough that occurs in the morning and sometimes throughout the day. This has been going on for the past 2-3 months.   2) Dementia: Currently managed on donepezil 5 mg. Underwent memory testing in July 2018 and was told that her memory has decreased which was thought to be secondary to her Primidone. She endorses difficulty falling asleep at night. She does nap intermittently throughout the day. Her daughter has been giving her Tylenol PM and tylenol at bedtime. Her daughter thinks that she's got her day and nights mixed up.  3) Essential Tremor: Previously managed on Primidone 100 mg BID that was removed in July this year due to decrease in memory after restesting. Her tremors have become worse since she's been off of her medication. She's not nearly as active as she once was. She is embarrassed by her tremors. She denies falls. She would like to try treatment other than Primidone.   4) Hypothyroidism: Currently managed on levothyroxine 25 mcg. TSH in late February slightly elevated with repeat TSH in June 2018 normal. She is compliant to her levothyroxine.  5) Emphysema: Noted on chest xray from March 2017. Her daughter has noticed intermittent shortness of breath and fatigue when she will walk short distances, also notices a cough that has been present for months. She will sometimes have to stop and rest due to her shortness of breath, this is not often. She leads a sedentary life and doesn't get out of the house much. Mostly naps and watches TV.   Review of Systems  Constitutional: Positive for fatigue.  Respiratory: Positive for cough.        Intermittent SOB    Cardiovascular: Negative for chest pain and palpitations.  Neurological: Positive for tremors.  Psychiatric/Behavioral: Positive for sleep disturbance. Negative for confusion. The patient is not nervous/anxious.        Denies depression       Past Medical History:  Diagnosis Date  . Acute confusion   . Arthritis   . Emphysema lung ([place])   . Glaucoma   . Hypertension   . Hyponatremia   . Hypothyroidism   . Memory loss   . Poor peripheral circulation ([place])   . Tremors of nervous system      Social History   Social History  . Marital status: Single    Spouse name: N/A  . Number of children: 1  . Years of education: 11th grade   Occupational History  . Retired    Social History Main Topics  . Smoking status: Never Smoker  . Smokeless tobacco: Never Used  . Alcohol use No  . Drug use: No  . Sexual activity: Not on file   Other Topics Concern  . Not on file   Social History Narrative   Lives with daughter and son-in-law.   Right-handed.   Drinks one soda per day.       Past Surgical History:  Procedure Laterality Date  . BREAST BIOPSY      Family History  Problem Relation Age of Onset  . Heart attack Mother   . Other Father  unsure - she was only 51 years old when he died    No Known Allergies  Current Outpatient Prescriptions on File Prior to Visit  Medication Sig Dispense Refill  . amLODipine (NORVASC) 10 MG tablet TAKE 1 TABLET (10 MG TOTAL) BY MOUTH DAILY. 90 tablet 1  . Calcium Carbonate (CALCIUM 600 PO) Take 600 mg by mouth 2 (two) times daily.    . Cholecalciferol (VITAMIN D3) 10000 units TABS Take 1,000 mg by mouth 2 (two) times daily.    Marland Kitchen donepezil (ARICEPT) 5 MG tablet TAKE 1 TABLET (5 MG TOTAL) BY MOUTH AT BEDTIME. 90 tablet 1  . IRON PO Take 65 mg by mouth daily with lunch.    . levobunolol (BETAGAN) 0.5 % ophthalmic solution Place 1 drop into both eyes 2 (two) times daily. 15 mL 1  . levothyroxine (SYNTHROID, LEVOTHROID) 25 MCG  tablet TAKE 1 TABLET (25 MCG TOTAL) BY MOUTH DAILY BEFORE BREAKFAST. 90 tablet 1  . lisinopril (PRINIVIL,ZESTRIL) 20 MG tablet TAKE 1 TABLET (20 MG TOTAL) BY MOUTH DAILY. 90 tablet 1   No current facility-administered medications on file prior to visit.     BP 118/72   Pulse 82   Temp 98.2 F (36.8 C) (Oral)   Ht 4\' 11"  (1.499 m)   Wt 117 lb 6.4 oz (53.3 kg)   SpO2 95%   BMI 23.71 kg/m    Objective:   Physical Exam  Constitutional: She is oriented to person, place, and time. She appears well-nourished.  Neck: Neck supple.  Cardiovascular: Normal rate and regular rhythm.   Pulmonary/Chest: Effort normal and breath sounds normal.  Neurological: She is alert and oriented to person, place, and time.  Constant resting tremor noted to head and bilateral upper extremities.   Skin: Skin is warm.          Assessment & Plan:

## 2017-03-29 NOTE — Assessment & Plan Note (Signed)
Evident on chest xray from 08/2015. Lungs clear on exam. Rx for albuterol inhaler sent to pharmacy to use PRN for shortness of breath. Hold ACE for potential cause of cough. She will update.

## 2017-03-29 NOTE — Assessment & Plan Note (Signed)
Stable in the office today. Lisinopril could be contributing to cough, will have her stop this for now and update in 2 weeks. Will start propranolol for tremors, she will monitor heart rate and blood pressure. Continue amlodipine.

## 2017-03-29 NOTE — Assessment & Plan Note (Signed)
Off Primidone since July 2018, symptoms have become worse and are embarrassing.  Discussed different options and will start with low dose propranolol, instructions provided for use. She will update in 3-4 weeks. Monitor BP and HR.

## 2017-03-29 NOTE — Addendum Note (Signed)
Addended by: Jacqualin Combes on: 03/29/2017 01:24 PM   Modules accepted: Orders

## 2017-04-12 ENCOUNTER — Telehealth: Payer: Self-pay | Admitting: Primary Care

## 2017-04-12 NOTE — Telephone Encounter (Addendum)
-----   Message from Pleas Koch, NP sent at 03/29/2017 11:47 AM EDT ----- Regarding: Cough Please check on patient. How's her cough since we held her lisinopril tablets? What's her BP and HR running?

## 2017-04-13 NOTE — Telephone Encounter (Signed)
Message left for patient's daughter to return my call.

## 2017-04-23 NOTE — Telephone Encounter (Signed)
Spoken to patient's daughter. She stated that patient's cough is better. BP is all over the place - 146-100 over 112-64. Hr has been ranging from highest at 130 to lowest at 71.

## 2017-04-23 NOTE — Telephone Encounter (Signed)
Message left for patient to return my call on 04/16/2017 and 04/17/2017

## 2017-04-24 ENCOUNTER — Other Ambulatory Visit: Payer: Self-pay | Admitting: Primary Care

## 2017-04-24 DIAGNOSIS — G250 Essential tremor: Secondary | ICD-10-CM

## 2017-04-24 MED ORDER — PROPRANOLOL HCL 60 MG PO TABS: ORAL_TABLET | ORAL | 0 refills | Status: DC

## 2017-04-24 NOTE — Telephone Encounter (Signed)
BP numbers are variable but reasonable given her age. Will defer to PCP if she wants medication adjustment.

## 2017-04-25 NOTE — Telephone Encounter (Signed)
Will not make adjustments at this time, glad to hear that the cough has improved. Have her continue to monitor 1-2 times weekly and report readings at or above 150/90. Please also have them contact me if the patient starts reporting palpitations, headaches, chest pain.

## 2017-04-26 NOTE — Telephone Encounter (Signed)
Spoken and notified patient's daughter of Kate's comments. Patient's daughter verbalized understanding. 

## 2017-05-14 ENCOUNTER — Other Ambulatory Visit: Payer: Self-pay | Admitting: Primary Care

## 2017-05-29 ENCOUNTER — Other Ambulatory Visit: Payer: Self-pay | Admitting: Primary Care

## 2017-05-29 DIAGNOSIS — I10 Essential (primary) hypertension: Secondary | ICD-10-CM

## 2017-06-07 ENCOUNTER — Other Ambulatory Visit: Payer: Self-pay | Admitting: Primary Care

## 2017-06-07 NOTE — Telephone Encounter (Signed)
I do not prescribe this. Needs to go to her ophthalmologist.

## 2017-06-07 NOTE — Telephone Encounter (Signed)
Copied from [place] [id number]. Topic: Quick Communication - Rx Refill/Question >> Jun 07, 2017 11:12 AM Arletha Grippe wrote: Has the patient contacted their pharmacy? Yes.     (Agent: If no, request that the patient contact the pharmacy for the refill.)   Preferred Pharmacy (with phone number or street name):[facility] called they are requesting  levobunolol (BETAGAN) 0.5 % ophthalmic solution, requesting 90 day supply - [facility] [place] cb # [phone]    Agent: Please be advised that RX refills may take up to 3 business days. We ask that you follow-up with your pharmacy.

## 2017-06-07 NOTE — Telephone Encounter (Signed)
Refill needs provider approval. Thanks.

## 2017-06-08 NOTE — Telephone Encounter (Signed)
Spoke to pt, and daughter [name] at her request, and advised per Anda Kraft

## 2017-07-23 ENCOUNTER — Other Ambulatory Visit: Payer: Self-pay | Admitting: Primary Care

## 2017-07-23 DIAGNOSIS — G250 Essential tremor: Secondary | ICD-10-CM

## 2017-08-08 DIAGNOSIS — D1800 Hemangioma unspecified site: Secondary | ICD-10-CM | POA: Diagnosis not present

## 2017-08-08 DIAGNOSIS — L820 Inflamed seborrheic keratosis: Secondary | ICD-10-CM | POA: Diagnosis not present

## 2017-08-08 DIAGNOSIS — Z1283 Encounter for screening for malignant neoplasm of skin: Secondary | ICD-10-CM | POA: Diagnosis not present

## 2017-08-08 DIAGNOSIS — L570 Actinic keratosis: Secondary | ICD-10-CM | POA: Diagnosis not present

## 2017-08-08 DIAGNOSIS — L821 Other seborrheic keratosis: Secondary | ICD-10-CM | POA: Diagnosis not present

## 2017-08-08 DIAGNOSIS — D485 Neoplasm of uncertain behavior of skin: Secondary | ICD-10-CM | POA: Diagnosis not present

## 2017-08-08 DIAGNOSIS — D229 Melanocytic nevi, unspecified: Secondary | ICD-10-CM | POA: Diagnosis not present

## 2017-08-08 DIAGNOSIS — I8393 Asymptomatic varicose veins of bilateral lower extremities: Secondary | ICD-10-CM | POA: Diagnosis not present

## 2017-08-08 DIAGNOSIS — I781 Nevus, non-neoplastic: Secondary | ICD-10-CM | POA: Diagnosis not present

## 2017-08-08 DIAGNOSIS — L812 Freckles: Secondary | ICD-10-CM | POA: Diagnosis not present

## 2017-08-08 DIAGNOSIS — L578 Other skin changes due to chronic exposure to nonionizing radiation: Secondary | ICD-10-CM | POA: Diagnosis not present

## 2017-08-23 ENCOUNTER — Other Ambulatory Visit: Payer: Self-pay | Admitting: Primary Care

## 2017-09-12 DIAGNOSIS — L578 Other skin changes due to chronic exposure to nonionizing radiation: Secondary | ICD-10-CM | POA: Diagnosis not present

## 2017-09-12 DIAGNOSIS — C44311 Basal cell carcinoma of skin of nose: Secondary | ICD-10-CM | POA: Diagnosis not present

## 2017-09-12 DIAGNOSIS — C44319 Basal cell carcinoma of skin of other parts of face: Secondary | ICD-10-CM | POA: Diagnosis not present

## 2017-09-12 DIAGNOSIS — D485 Neoplasm of uncertain behavior of skin: Secondary | ICD-10-CM | POA: Diagnosis not present

## 2017-09-27 ENCOUNTER — Ambulatory Visit (INDEPENDENT_AMBULATORY_CARE_PROVIDER_SITE_OTHER)
Admission: RE | Admit: 2017-09-27 | Discharge: 2017-09-27 | Disposition: A | Discharge status: Home / Self Care | Payer: Medicare Other | Source: Ambulatory Visit | Attending: Primary Care | Admitting: Primary Care

## 2017-09-27 ENCOUNTER — Encounter: Payer: Self-pay | Admitting: Primary Care

## 2017-09-27 ENCOUNTER — Ambulatory Visit (INDEPENDENT_AMBULATORY_CARE_PROVIDER_SITE_OTHER): Payer: Medicare Other | Admitting: Primary Care

## 2017-09-27 VITALS — BP 114/66 | HR 67 | Temp 98.0°F | Ht 59.0 in | Wt 124.5 lb

## 2017-09-27 DIAGNOSIS — F0390 Unspecified dementia without behavioral disturbance: Secondary | ICD-10-CM | POA: Diagnosis not present

## 2017-09-27 DIAGNOSIS — I10 Essential (primary) hypertension: Secondary | ICD-10-CM | POA: Diagnosis not present

## 2017-09-27 DIAGNOSIS — E039 Hypothyroidism, unspecified: Secondary | ICD-10-CM

## 2017-09-27 DIAGNOSIS — R0602 Shortness of breath: Secondary | ICD-10-CM | POA: Diagnosis not present

## 2017-09-27 DIAGNOSIS — J439 Emphysema, unspecified: Secondary | ICD-10-CM | POA: Diagnosis not present

## 2017-09-27 DIAGNOSIS — G250 Essential tremor: Secondary | ICD-10-CM | POA: Diagnosis not present

## 2017-09-27 DIAGNOSIS — R600 Localized edema: Secondary | ICD-10-CM

## 2017-09-27 LAB — COMPREHENSIVE METABOLIC PANEL
ALK PHOS: 76 U/L (ref 39–117)
ALT: 10 U/L (ref 0–35)
AST: 17 U/L (ref 0–37)
Albumin: 4.5 g/dL (ref 3.5–5.2)
BILIRUBIN TOTAL: 0.5 mg/dL (ref 0.2–1.2)
BUN: 23 mg/dL (ref 6–23)
CALCIUM: 10.3 mg/dL (ref 8.4–10.5)
CO2: 26 mEq/L (ref 19–32)
CREATININE: 1.29 mg/dL — AB (ref 0.40–1.20)
Chloride: 104 mEq/L (ref 96–112)
GFR: 41.07 mL/min — AB (ref >60.00)
GLUCOSE: 69 mg/dL — AB (ref 70–99)
Potassium: 4.4 mEq/L (ref 3.5–5.1)
Sodium: 139 mEq/L (ref 135–145)
TOTAL PROTEIN: 8.5 g/dL — AB (ref 6.0–8.3)

## 2017-09-27 LAB — TSH: TSH: 4.03 u[IU]/mL (ref 0.35–4.50)

## 2017-09-27 LAB — BRAIN NATRIURETIC PEPTIDE: PRO B NATRI PEPTIDE: 191 pg/mL — AB (ref 0.0–100.0)

## 2017-09-27 NOTE — Assessment & Plan Note (Signed)
Stable in the office today on Amlodipine 10 and propranolol 60 mg BID, continue same. Cough resolved once lisinopril was removed. Ankle edema could be secondary to Amlodipine, continue to monitor.

## 2017-09-27 NOTE — Progress Notes (Signed)
Subjective:    Patient ID: Nicole Downs, female    DOB: August 11, 1923, 84 y.o.   MRN: [id number]  HPI  Nicole Downs is a 84 year old female who presents today for follow up.  1) Essential Hypertension: Currently managed on Amlodipine 10 mg, propranolol 60 mg twice daily (tremors). She's not taken her lisinopril 20 mg since October due to persistent dry cough. She denies cough since.  BP Readings from Last 3 Encounters:  09/27/17 114/66  03/29/17 118/72  11/30/16 (!) 159/77   2) Essential Tremor: Currently managed on propranolol 60 mg BID. Overall continues to experience tremors   3) Hypothyroidism: Currently managed on levothyroxine 25 mcg. Her last TSH check was in June 2018 with TSH of 2.99.  4) Dementia: Currently managed on donepezil 5 mg. Her daughter denies changes in memory or confusion.   5) Emphysema: Found on chest xray from 2017. Currently managed on albuterol HFA PRN. She cannot use the albuterol inhaler due to decreased strength in fingers. She is not very active at home. She denies wheezing, shortness of breath. Never a smoker.   Review of Systems  Constitutional: Negative for fatigue.  Eyes: Negative for visual disturbance.  Respiratory: Negative for shortness of breath.   Cardiovascular: Positive for leg swelling. Negative for chest pain.  Neurological: Positive for tremors. Negative for dizziness and headaches.  Psychiatric/Behavioral: Negative for confusion.       Past Medical History:  Diagnosis Date  . Acute confusion   . Arthritis   . Emphysema lung ([place])   . Glaucoma   . Hypertension   . Hyponatremia   . Hypothyroidism   . Memory loss   . Poor peripheral circulation   . Tremors of nervous system      Social History   Socioeconomic History  . Marital status: Single    Spouse name: Not on file  . Number of children: 1  . Years of education: 11th grade  . Highest education level: Not on file  Occupational History  . Occupation: Retired    Scientific laboratory technician  . Financial resource strain: Not on file  . Food insecurity:    Worry: Not on file    Inability: Not on file  . Transportation needs:    Medical: Not on file    Non-medical: Not on file  Tobacco Use  . Smoking status: Never Smoker  . Smokeless tobacco: Never Used  Substance and Sexual Activity  . Alcohol use: No    Alcohol/week: 0.0 oz  . Drug use: No  . Sexual activity: Not on file  Lifestyle  . Physical activity:    Days per week: Not on file    Minutes per session: Not on file  . Stress: Not on file  Relationships  . Social connections:    Talks on phone: Not on file    Gets together: Not on file    Attends religious service: Not on file    Active member of club or organization: Not on file    Attends meetings of clubs or organizations: Not on file    Relationship status: Not on file  . Intimate partner violence:    Fear of current or ex partner: Not on file    Emotionally abused: Not on file    Physically abused: Not on file    Forced sexual activity: Not on file  Other Topics Concern  . Not on file  Social History Narrative   Lives with daughter and son-in-law.  Right-handed.   Drinks one soda per day.    Past Surgical History:  Procedure Laterality Date  . BREAST BIOPSY      Family History  Problem Relation Age of Onset  . Heart attack Mother   . Other Father        unsure - she was only 64 years old when he died    No Known Allergies  Current Outpatient Medications on File Prior to Visit  Medication Sig Dispense Refill  . albuterol (PROVENTIL HFA;VENTOLIN HFA) 108 (90 Base) MCG/ACT inhaler Inhale 2 puffs into the lungs every 6 (six) hours as needed for shortness of breath. 1 Inhaler 0  . amLODipine (NORVASC) 10 MG tablet TAKE 1 TABLET (10 MG TOTAL) BY MOUTH DAILY. 90 tablet 1  . Calcium Carbonate (CALCIUM 600 PO) Take 600 mg by mouth 2 (two) times daily.    . Cholecalciferol (VITAMIN D3) 10000 units TABS Take 1,000 mg by mouth 2  (two) times daily.    Marland Kitchen donepezil (ARICEPT) 5 MG tablet TAKE 1 TABLET (5 MG TOTAL) BY MOUTH AT BEDTIME. 90 tablet 1  . IRON PO Take 65 mg by mouth daily with lunch.    . levobunolol (BETAGAN) 0.5 % ophthalmic solution Place 1 drop into both eyes 2 (two) times daily. 15 mL 1  . levothyroxine (SYNTHROID, LEVOTHROID) 25 MCG tablet TAKE 1 TABLET (25 MCG TOTAL) BY MOUTH DAILY BEFORE BREAKFAST. 90 tablet 0  . propranolol (INDERAL) 60 MG tablet TAKE 1 TABLET BY MOUTH TWICE DAILY FOR TREMORS. 180 tablet 0   No current facility-administered medications on file prior to visit.     BP 114/66   Pulse 67   Temp 98 F (36.7 C) (Oral)   Ht 4\' 11"  (1.499 m)   Wt 124 lb 8 oz (56.5 kg)   SpO2 99%   BMI 25.15 kg/m    Objective:   Physical Exam  Constitutional: She is oriented to person, place, and time. She appears well-nourished.  HENT:  Head:    Scabbed wound to bridge of nose from lesion removal per dermatology.   Non scabbed circular wound/crater from lesion removal per dermatology. No infection noted.  Neck: Neck supple.  Cardiovascular: Normal rate and regular rhythm.  Pulses:      Dorsalis pedis pulses are 1+ on the left side.       Posterior tibial pulses are 1+ on the left side.  Left lower extremity edema to ankle and proximal ankle. Trace pitting.  Pulmonary/Chest: Effort normal and breath sounds normal.  Neurological: She is alert and oriented to person, place, and time.  Follows commands  Skin: Skin is warm and dry.  Psychiatric: She has a normal mood and affect.          Assessment & Plan:

## 2017-09-27 NOTE — Assessment & Plan Note (Signed)
Noted today. Continue propranolol 60 mg BID.

## 2017-09-27 NOTE — Assessment & Plan Note (Signed)
Repeat TSH pending. Continue levothyroxine 25 mcg for now. 

## 2017-09-27 NOTE — Assessment & Plan Note (Signed)
Could be partially secondary to Amlodipine, but is sedentary during the day. Discussed importance of getting up throughout the day, start by walking 10-15 minutes once daily. Pedal pulses stable.  Check BNP and chest xray today.

## 2017-09-27 NOTE — Assessment & Plan Note (Signed)
Offered nebulizer machine Rx with nebulized albuterol. Daughter kindly declines as patient will not likely use it. She doesn't ever require use of her albuterol. Lungs clear on exam today. Continue to monitor.

## 2017-09-27 NOTE — Patient Instructions (Addendum)
Stop by the lab and xray prior to leaving today. I will notify you of your results once received.   Increase your activity level by walking at least 10 minutes daily.   Continue to elevate your legs when resting.   Please schedule a follow up appointment in 6 months.   It was a pleasure to see you today!

## 2017-09-27 NOTE — Assessment & Plan Note (Signed)
Alert and oriented today, follows commands. Continue donepezil.

## 2017-10-03 ENCOUNTER — Telehealth: Payer: Self-pay | Admitting: Primary Care

## 2017-10-03 DIAGNOSIS — I10 Essential (primary) hypertension: Secondary | ICD-10-CM

## 2017-10-03 MED ORDER — LOSARTAN POTASSIUM 25 MG PO TABS: ORAL_TABLET | ORAL | 0 refills | Status: DC

## 2017-10-03 NOTE — Telephone Encounter (Signed)
Copied from [place] [id number]. Topic: Inquiry >> Oct 03, 2017  9:45 AM Corie Chiquito, [place] wrote: Reason for CRM: Patients daughter is calling to let Ms.Clark know about her mothers BP since she has been on a new medication for it. If she could give her a call back at [phone]

## 2017-10-03 NOTE — Telephone Encounter (Signed)
Spoken and notified patient's daughter of Tawni Millers comments. Patient's daughter verbalized understanding. Follow up on 10/17/2017

## 2017-10-03 NOTE — Telephone Encounter (Signed)
Please notify patient and her daughter that we will stop the Amlodipine as it was likely contributing to her ankle swelling. For blood pressure treatment we will start losartan 25 mg. Take 1 tablet once daily for blood pressure. She needs re-evaluation in 2-3 weeks for BP check. Please schedule.

## 2017-10-03 NOTE — Telephone Encounter (Signed)
Spoke to pt's daughter [name] who states swelling in pt's legs has improved. Her BP readings are as followed: 4-15 134/72 4-16  150/89 in am before breakfast and 144/88 after nap appx 1600 4-17 149/74 at 0930

## 2017-10-12 ENCOUNTER — Telehealth: Payer: Self-pay | Admitting: Primary Care

## 2017-10-12 NOTE — Telephone Encounter (Signed)
-----   Message from Pleas Koch, NP sent at 09/28/2017  8:54 AM EDT ----- Regarding: BP and Ankle edema Please check on patient since we held her Amlodipine for 2 weeks. How's her ankle swelling? How's her BP?

## 2017-10-12 NOTE — Telephone Encounter (Signed)
Per DPR, left detail message of Kate Clark's comments for patient's daughter to call back.  

## 2017-10-12 NOTE — Telephone Encounter (Signed)
Copied from [place] [id number]. Topic: General - Other >> Oct 12, 2017  3:44 PM Neva Seat wrote: Talmadge Coventry returned Chan's Call. [name] said she will call Vallarie Mare back on Mon.

## 2017-10-15 ENCOUNTER — Other Ambulatory Visit: Payer: Self-pay | Admitting: Primary Care

## 2017-10-15 DIAGNOSIS — G250 Essential tremor: Secondary | ICD-10-CM

## 2017-10-15 NOTE — Telephone Encounter (Signed)
Yes please, thanks

## 2017-10-15 NOTE — Telephone Encounter (Signed)
Spoken to patient's daughter stated no more swelling since off amlodipine. BP has been running 96/60, 95/66, 124/65, 131/70, 115/74, 152/86, 167/92, 130/76, 116/76, 155/85.  [name] made a appointment for this Wednesday 10/17/2017. Do you want her to keep this appointment?

## 2017-10-15 NOTE — Telephone Encounter (Signed)
[name] returning your call. Please call back. Thanks!

## 2017-10-16 NOTE — Telephone Encounter (Signed)
Spoken and notified patient's daughter of Nicole Millers comments to keep appt

## 2017-10-17 ENCOUNTER — Ambulatory Visit (INDEPENDENT_AMBULATORY_CARE_PROVIDER_SITE_OTHER): Payer: Medicare Other | Admitting: Primary Care

## 2017-10-17 ENCOUNTER — Encounter: Payer: Self-pay | Admitting: Primary Care

## 2017-10-17 VITALS — BP 142/70 | HR 65 | Temp 97.9°F | Ht 59.0 in | Wt 121.5 lb

## 2017-10-17 DIAGNOSIS — I10 Essential (primary) hypertension: Secondary | ICD-10-CM | POA: Diagnosis not present

## 2017-10-17 LAB — BASIC METABOLIC PANEL
BUN: 27 mg/dL — ABNORMAL HIGH (ref 6–23)
CHLORIDE: 103 meq/L (ref 96–112)
CO2: 28 mEq/L (ref 19–32)
CREATININE: 1.31 mg/dL — AB (ref 0.40–1.20)
Calcium: 10.2 mg/dL (ref 8.4–10.5)
GFR: 40.35 mL/min — AB (ref >60.00)
Glucose, Bld: 78 mg/dL (ref 70–99)
Potassium: 4.1 mEq/L (ref 3.5–5.1)
Sodium: 139 mEq/L (ref 135–145)

## 2017-10-17 MED ORDER — LOSARTAN POTASSIUM 25 MG PO TABS: ORAL_TABLET | ORAL | 2 refills | Status: DC

## 2017-10-17 NOTE — Patient Instructions (Signed)
Stop by the lab prior to leaving today. I will notify you of your results once received.   Continue to monitor your blood pressure and report readings at or above 150/90 on a consistent basis.  It was a pleasure to see you today!

## 2017-10-17 NOTE — Progress Notes (Signed)
Subjective:    Patient ID: Nicole Downs, female    DOB: 1923-06-28, 84 y.o.   MRN: [id number]  HPI  Nicole Downs is a 84 year old female who presents today for follow up of hypertension and ankle edema.  During her last visit she endorsed intermittent ankle edema. She wasn't very physically active and was also managed on Amlodipine. We encouraged increased activity, elevation of extremities, and held Amlodipine. After holding Amlodipine her blood pressure started rising so she was initiated on Losartan 25 mg.   Since her last visit her ankle edema has resolved. Her daughter is checking her BP at home and is getting readings of 96/60, 124/65, 131/70, 152/86, 167/92, 130/76, 116/76, 155/85. She is trying to become more active during the day with walking in her backyard. She is compliant to her Losartan 25 mg tablets.    BP Readings from Last 3 Encounters:  10/17/17 (!) 142/70  09/27/17 114/66  03/29/17 118/72    She denies dizziness, chest pain, headaches.   Review of Systems  Respiratory: Negative for shortness of breath.   Cardiovascular: Negative for chest pain and leg swelling.  Neurological: Negative for dizziness and headaches.        Past Medical History:  Diagnosis Date  . Acute confusion   . Arthritis   . Emphysema lung ([place])   . Glaucoma   . Hypertension   . Hyponatremia   . Hypothyroidism   . Memory loss   . Poor peripheral circulation   . Tremors of nervous system      Social History   Socioeconomic History  . Marital status: Single    Spouse name: Not on file  . Number of children: 1  . Years of education: 11th grade  . Highest education level: Not on file  Occupational History  . Occupation: Retired  Scientific laboratory technician  . Financial resource strain: Not on file  . Food insecurity:    Worry: Not on file    Inability: Not on file  . Transportation needs:    Medical: Not on file    Non-medical: Not on file  Tobacco Use  . Smoking status: Never  Smoker  . Smokeless tobacco: Never Used  Substance and Sexual Activity  . Alcohol use: No    Alcohol/week: 0.0 oz  . Drug use: No  . Sexual activity: Not on file  Lifestyle  . Physical activity:    Days per week: Not on file    Minutes per session: Not on file  . Stress: Not on file  Relationships  . Social connections:    Talks on phone: Not on file    Gets together: Not on file    Attends religious service: Not on file    Active member of club or organization: Not on file    Attends meetings of clubs or organizations: Not on file    Relationship status: Not on file  . Intimate partner violence:    Fear of current or ex partner: Not on file    Emotionally abused: Not on file    Physically abused: Not on file    Forced sexual activity: Not on file  Other Topics Concern  . Not on file  Social History Narrative   Lives with daughter and son-in-law.   Right-handed.   Drinks one soda per day.    Past Surgical History:  Procedure Laterality Date  . BREAST BIOPSY      Family History  Problem Relation Age of Onset  .  Heart attack Mother   . Other Father        unsure - she was only 28 years old when he died    Allergies  Allergen Reactions  . Amlodipine Other (See Comments)    Ankle edema    Current Outpatient Medications on File Prior to Visit  Medication Sig Dispense Refill  . albuterol (PROVENTIL HFA;VENTOLIN HFA) 108 (90 Base) MCG/ACT inhaler Inhale 2 puffs into the lungs every 6 (six) hours as needed for shortness of breath. 1 Inhaler 0  . Calcium Carbonate (CALCIUM 600 PO) Take 600 mg by mouth 2 (two) times daily.    . Cholecalciferol (VITAMIN D3) 10000 units TABS Take 1,000 mg by mouth 2 (two) times daily.    Marland Kitchen donepezil (ARICEPT) 5 MG tablet TAKE 1 TABLET (5 MG TOTAL) BY MOUTH AT BEDTIME. 90 tablet 1  . IRON PO Take 65 mg by mouth daily with lunch.    . levobunolol (BETAGAN) 0.5 % ophthalmic solution Place 1 drop into both eyes 2 (two) times daily. 15 mL 1    . levothyroxine (SYNTHROID, LEVOTHROID) 25 MCG tablet TAKE 1 TABLET (25 MCG TOTAL) BY MOUTH DAILY BEFORE BREAKFAST. 90 tablet 0  . losartan (COZAAR) 25 MG tablet Take 1 tablet by mouth once daily for blood pressure. 30 tablet 0  . propranolol (INDERAL) 60 MG tablet TAKE 1 TABLET BY MOUTH TWICE DAILY FOR TREMORS. 180 tablet 0   No current facility-administered medications on file prior to visit.     BP (!) 142/70 (BP Location: Right Arm, Patient Position: Sitting, Cuff Size: Normal)   Pulse 65   Temp 97.9 F (36.6 C) (Oral)   Ht 4\' 11"  (1.499 m)   Wt 121 lb 8 oz (55.1 kg)   SpO2 99%   BMI 24.54 kg/m    Objective:   Physical Exam  Constitutional: She appears well-nourished.  Cardiovascular: Normal rate and regular rhythm.  No lower extremity edema noted  Pulmonary/Chest: Effort normal and breath sounds normal.  Skin: Skin is warm and dry.          Assessment & Plan:

## 2017-10-17 NOTE — Assessment & Plan Note (Signed)
Overall stable on Losartan 25 mg. BP stable today. Home readings with fluctuations but mostly within normal range.   Will have her daughter continue to monitor and notify me if readings become at or above 150/90 consistently.  Ankle edema has resolved.

## 2017-10-24 DIAGNOSIS — L578 Other skin changes due to chronic exposure to nonionizing radiation: Secondary | ICD-10-CM | POA: Diagnosis not present

## 2017-10-24 DIAGNOSIS — C44712 Basal cell carcinoma of skin of right lower limb, including hip: Secondary | ICD-10-CM | POA: Diagnosis not present

## 2017-10-24 DIAGNOSIS — D485 Neoplasm of uncertain behavior of skin: Secondary | ICD-10-CM | POA: Diagnosis not present

## 2017-10-24 DIAGNOSIS — D0471 Carcinoma in situ of skin of right lower limb, including hip: Secondary | ICD-10-CM | POA: Diagnosis not present

## 2017-10-24 DIAGNOSIS — Z85828 Personal history of other malignant neoplasm of skin: Secondary | ICD-10-CM | POA: Diagnosis not present

## 2017-11-01 ENCOUNTER — Other Ambulatory Visit: Payer: Self-pay | Admitting: Primary Care

## 2017-11-17 ENCOUNTER — Other Ambulatory Visit: Payer: Self-pay | Admitting: Primary Care

## 2017-11-28 ENCOUNTER — Other Ambulatory Visit: Payer: Self-pay | Admitting: Primary Care

## 2017-11-28 DIAGNOSIS — I10 Essential (primary) hypertension: Secondary | ICD-10-CM

## 2017-12-05 DIAGNOSIS — D0471 Carcinoma in situ of skin of right lower limb, including hip: Secondary | ICD-10-CM | POA: Diagnosis not present

## 2017-12-05 DIAGNOSIS — C44719 Basal cell carcinoma of skin of left lower limb, including hip: Secondary | ICD-10-CM | POA: Diagnosis not present

## 2017-12-05 DIAGNOSIS — L578 Other skin changes due to chronic exposure to nonionizing radiation: Secondary | ICD-10-CM | POA: Diagnosis not present

## 2017-12-05 DIAGNOSIS — D0472 Carcinoma in situ of skin of left lower limb, including hip: Secondary | ICD-10-CM | POA: Diagnosis not present

## 2017-12-05 DIAGNOSIS — Z85828 Personal history of other malignant neoplasm of skin: Secondary | ICD-10-CM | POA: Diagnosis not present

## 2017-12-05 DIAGNOSIS — D485 Neoplasm of uncertain behavior of skin: Secondary | ICD-10-CM | POA: Diagnosis not present

## 2018-01-12 ENCOUNTER — Other Ambulatory Visit: Payer: Self-pay | Admitting: Primary Care

## 2018-01-12 DIAGNOSIS — G250 Essential tremor: Secondary | ICD-10-CM

## 2018-01-16 ENCOUNTER — Other Ambulatory Visit: Payer: Self-pay | Admitting: Primary Care

## 2018-01-16 DIAGNOSIS — G250 Essential tremor: Secondary | ICD-10-CM

## 2018-04-03 ENCOUNTER — Ambulatory Visit: Payer: Medicare Other

## 2018-04-03 ENCOUNTER — Encounter: Payer: Medicare Other | Admitting: Primary Care

## 2018-04-10 ENCOUNTER — Ambulatory Visit (INDEPENDENT_AMBULATORY_CARE_PROVIDER_SITE_OTHER): Payer: Medicare Other | Admitting: Primary Care

## 2018-04-10 ENCOUNTER — Telehealth: Payer: Self-pay | Admitting: Radiology

## 2018-04-10 ENCOUNTER — Encounter: Payer: Medicare Other | Admitting: Primary Care

## 2018-04-10 ENCOUNTER — Ambulatory Visit (INDEPENDENT_AMBULATORY_CARE_PROVIDER_SITE_OTHER): Payer: Medicare Other

## 2018-04-10 VITALS — BP 130/78 | HR 65 | Temp 97.8°F | Ht 60.0 in | Wt 129.0 lb

## 2018-04-10 DIAGNOSIS — I10 Essential (primary) hypertension: Secondary | ICD-10-CM | POA: Diagnosis not present

## 2018-04-10 DIAGNOSIS — R600 Localized edema: Secondary | ICD-10-CM | POA: Diagnosis not present

## 2018-04-10 DIAGNOSIS — F331 Major depressive disorder, recurrent, moderate: Secondary | ICD-10-CM

## 2018-04-10 DIAGNOSIS — G250 Essential tremor: Secondary | ICD-10-CM | POA: Diagnosis not present

## 2018-04-10 DIAGNOSIS — F0390 Unspecified dementia without behavioral disturbance: Secondary | ICD-10-CM | POA: Diagnosis not present

## 2018-04-10 DIAGNOSIS — Z0000 Encounter for general adult medical examination without abnormal findings: Secondary | ICD-10-CM

## 2018-04-10 DIAGNOSIS — Z23 Encounter for immunization: Secondary | ICD-10-CM

## 2018-04-10 DIAGNOSIS — E559 Vitamin D deficiency, unspecified: Secondary | ICD-10-CM

## 2018-04-10 DIAGNOSIS — J439 Emphysema, unspecified: Secondary | ICD-10-CM | POA: Diagnosis not present

## 2018-04-10 DIAGNOSIS — E039 Hypothyroidism, unspecified: Secondary | ICD-10-CM

## 2018-04-10 LAB — COMPREHENSIVE METABOLIC PANEL
ALK PHOS: 57 U/L (ref 39–117)
ALT: 12 U/L (ref 0–35)
AST: 15 U/L (ref 0–37)
Albumin: 3.9 g/dL (ref 3.5–5.2)
BUN: 24 mg/dL — ABNORMAL HIGH (ref 6–23)
CALCIUM: 9.7 mg/dL (ref 8.4–10.5)
CO2: 30 mEq/L (ref 19–32)
Chloride: 102 mEq/L (ref 96–112)
Creatinine, Ser: 1.33 mg/dL — ABNORMAL HIGH (ref 0.40–1.20)
GFR: 39.61 mL/min — AB (ref >60.00)
Glucose, Bld: 106 mg/dL — ABNORMAL HIGH (ref 70–99)
Potassium: 4.8 mEq/L (ref 3.5–5.1)
Sodium: 139 mEq/L (ref 135–145)
TOTAL PROTEIN: 7.5 g/dL (ref 6.0–8.3)
Total Bilirubin: 0.4 mg/dL (ref 0.2–1.2)

## 2018-04-10 LAB — CBC WITH DIFFERENTIAL/PLATELET
Basophils Absolute: 0.1 10*3/uL (ref 0.0–0.1)
Basophils Relative: 0.9 % (ref 0.0–3.0)
Eosinophils Absolute: 0.1 10*3/uL (ref 0.0–0.7)
Eosinophils Relative: 1.1 % (ref 0.0–5.0)
HCT: 38.6 % (ref 36.0–46.0)
Hemoglobin: 12.9 g/dL (ref 12.0–15.0)
LYMPHS ABS: 2.4 10*3/uL (ref 0.7–4.0)
Lymphocytes Relative: 26 % (ref 12.0–46.0)
MCHC: 33.5 g/dL (ref 30.0–36.0)
MCV: 96 fl (ref 78.0–100.0)
MONO ABS: 1.1 10*3/uL — AB (ref 0.1–1.0)
MONOS PCT: 12.2 % — AB (ref 3.0–12.0)
Neutro Abs: 5.4 10*3/uL (ref 1.4–7.7)
Neutrophils Relative %: 59.8 % (ref 43.0–77.0)
PLATELETS: 267 10*3/uL (ref 150.0–400.0)
RBC: 4.02 Mil/uL (ref 3.87–5.11)
RDW: 13.4 % (ref 11.5–15.5)
WBC: 9.1 10*3/uL (ref 4.0–10.5)

## 2018-04-10 LAB — VITAMIN D 25 HYDROXY (VIT D DEFICIENCY, FRACTURES): VITD: 104.24 ng/mL (ref 30.00–100.00)

## 2018-04-10 MED ORDER — SERTRALINE HCL 25 MG PO TABS: ORAL_TABLET | ORAL | 0 refills | Status: DC

## 2018-04-10 NOTE — Assessment & Plan Note (Signed)
No edema noted on exam. She is elevating her legs at night.

## 2018-04-10 NOTE — Telephone Encounter (Signed)
Noted. Will notify patient through lab results. Decrease vitamin D to 1000 units once daily.

## 2018-04-10 NOTE — Assessment & Plan Note (Signed)
Little interest or motivation to do anything, present for nearly 2 years. PHQ 9 score of 10 today.  Rx for sertraline 25 mg sent to pharmacy.  Patient is to take 1/2 tablet daily for 6 days, then advance to 1 full tablet thereafter. We discussed possible side effects of headache, GI upset, drowsiness, and SI/HI. If thoughts of SI/HI develop, we discussed to present to the emergency immediately. Patient verbalized understanding.   We will call in 4 weeks for an update.

## 2018-04-10 NOTE — Assessment & Plan Note (Signed)
Chronic and compliant to propranolol.  Continue to monitor. She decline neurology evaluation.

## 2018-04-10 NOTE — Progress Notes (Signed)
PCP notes:   Health maintenance:  Flu vaccine - administered  Abnormal screenings:   None  Patient concerns:   Medication management - PCP notified  Nurse concerns:  None  Next PCP appt:   04/10/2018 @ 0940

## 2018-04-10 NOTE — Assessment & Plan Note (Signed)
Alert and oriented today, following commands.  Continue donepezil.

## 2018-04-10 NOTE — Patient Instructions (Addendum)
Start sertraline 25 mg tablets at bedtime for depression. Start by taking 1/2 tablet by mouth nightly for 6 days, then increase to 1 full tablet thereafter.  Start exercising. You should be getting 30 minutes of exercise 5 days weekly.   Ensure you are consuming 8 cups of water daily.  Make sure to eat vegetables, fruit, whole grains, lean protein.  Please schedule a follow up appointment in 6 months.   It was a pleasure to see you today!

## 2018-04-10 NOTE — Patient Instructions (Addendum)
Nicole Downs , Thank you for taking time to come for your Medicare Wellness Visit. I appreciate your ongoing commitment to your health goals. Please review the following plan we discussed and let me know if I can assist you in the future.   These are the goals we discussed: Goals    . Patient Stated     Starting 04/10/2018, I will continue to take medications as prescribed.        This is a list of the screening recommended for you and due dates:  Health Maintenance  Topic Date Due  . Tetanus Vaccine  06/19/2018  . Flu Shot  Completed  . DEXA scan (bone density measurement)  Completed  . Pneumonia vaccines  Completed   Preventive Care for Adults  A healthy lifestyle and preventive care can promote health and wellness. Preventive health guidelines for adults include the following key practices.  . A routine yearly physical is a good way to check with your health care provider about your health and preventive screening. It is a chance to share any concerns and updates on your health and to receive a thorough exam.  . Visit your dentist for a routine exam and preventive care every 6 months. Brush your teeth twice a day and floss once a day. Good oral hygiene prevents tooth decay and gum disease.  . The frequency of eye exams is based on your age, health, family medical history, use  of contact lenses, and other factors. Follow your health care provider's recommendations for frequency of eye exams.  . Eat a healthy diet. Foods like vegetables, fruits, whole grains, low-fat dairy products, and lean protein foods contain the nutrients you need without too many calories. Decrease your intake of foods high in solid fats, added sugars, and salt. Eat the right amount of calories for you. Get information about a proper diet from your health care provider, if necessary.  . Regular physical exercise is one of the most important things you can do for your health. Most adults should get at least  150 minutes of moderate-intensity exercise (any activity that increases your heart rate and causes you to sweat) each week. In addition, most adults need muscle-strengthening exercises on 2 or more days a week.  Silver Sneakers may be a benefit available to you. To determine eligibility, you may visit the website: www.silversneakers.com or contact program at 561-715-7596 Mon-Fri between 8AM-8PM.   . Maintain a healthy weight. The body mass index (BMI) is a screening tool to identify possible weight problems. It provides an estimate of body fat based on height and weight. Your health care provider can find your BMI and can help you achieve or maintain a healthy weight.   For adults 20 years and older: ? A BMI below 18.5 is considered underweight. ? A BMI of 18.5 to 24.9 is normal. ? A BMI of 25 to 29.9 is considered overweight. ? A BMI of 30 and above is considered obese.   . Maintain normal blood lipids and cholesterol levels by exercising and minimizing your intake of saturated fat. Eat a balanced diet with plenty of fruit and vegetables. Blood tests for lipids and cholesterol should begin at age 25 and be repeated every 5 years. If your lipid or cholesterol levels are high, you are over 50, or you are at high risk for heart disease, you may need your cholesterol levels checked more frequently. Ongoing high lipid and cholesterol levels should be treated with medicines if diet and exercise  are not working.  . If you smoke, find out from your health care provider how to quit. If you do not use tobacco, please do not start.  . If you choose to drink alcohol, please do not consume more than 2 drinks per day. One drink is considered to be 12 ounces (355 mL) of beer, 5 ounces (148 mL) of wine, or 1.5 ounces (44 mL) of liquor.  . If you are 57-22 years old, ask your health care provider if you should take aspirin to prevent strokes.  . Use sunscreen. Apply sunscreen liberally and repeatedly  throughout the day. You should seek shade when your shadow is shorter than you. Protect yourself by wearing long sleeves, pants, a wide-brimmed hat, and sunglasses year round, whenever you are outdoors.  . Once a month, do a whole body skin exam, using a mirror to look at the skin on your back. Tell your health care provider of new moles, moles that have irregular borders, moles that are larger than a pencil eraser, or moles that have changed in shape or color.

## 2018-04-10 NOTE — Assessment & Plan Note (Signed)
TSH from April 2019 stable, continue to monitor. She is taking levothyroxine daily, continue 25 mcg.

## 2018-04-10 NOTE — Progress Notes (Signed)
Subjective:   Nicole Downs is a 84 y.o. female who presents for Medicare Annual (Subsequent) preventive examination.  Review of Systems:  N/A Cardiac Risk Factors include: advanced age (>56men, >61 women);hypertension     Objective:     Vitals: BP 130/78 (BP Location: Right Arm, Patient Position: Sitting, Cuff Size: Normal)   Pulse 65   Temp 97.8 F (36.6 C) (Oral)   Ht 5' (1.524 m) Comment: shoes  Wt 129 lb (58.5 kg)   SpO2 96%   BMI 25.19 kg/m   Body mass index is 25.19 kg/m.  Advanced Directives 04/10/2018  Does Patient Have a Medical Advance Directive? Yes  Type of Advance Directive [place] in Chart? No - copy requested    Tobacco Social History   Tobacco Use  Smoking Status Never Smoker  Smokeless Tobacco Never Used     Counseling given: No   Clinical Intake:  Pre-visit preparation completed: Yes  Pain : No/denies pain Pain Score: 0-No pain     Nutritional Status: BMI of 19-24  Normal Nutritional Risks: None Diabetes: No  How often do you need to have someone help you when you read instructions, pamphlets, or other written materials from your doctor or pharmacy?: 4 - Often What is the last grade level you completed in school?: 11th grade  Interpreter Needed?: No  Comments: pt lives with daughter Information entered by :: LPinson, LPN  Past Medical History:  Diagnosis Date  . Acute confusion   . Arthritis   . Emphysema lung ([place])   . Glaucoma   . Hypertension   . Hyponatremia   . Hypothyroidism   . Memory loss   . Poor peripheral circulation   . Tremors of nervous system    Past Surgical History:  Procedure Laterality Date  . BREAST BIOPSY     Family History  Problem Relation Age of Onset  . Heart attack Mother   . Other Father        unsure - she was only 69 years old when he died   Social History   Socioeconomic History  . Marital status: Single    Spouse  name: Not on file  . Number of children: 1  . Years of education: 11th grade  . Highest education level: Not on file  Occupational History  . Occupation: Retired  Scientific laboratory technician  . Financial resource strain: Not on file  . Food insecurity:    Worry: Not on file    Inability: Not on file  . Transportation needs:    Medical: Not on file    Non-medical: Not on file  Tobacco Use  . Smoking status: Never Smoker  . Smokeless tobacco: Never Used  Substance and Sexual Activity  . Alcohol use: No    Alcohol/week: 0.0 standard drinks  . Drug use: No  . Sexual activity: Not Currently  Lifestyle  . Physical activity:    Days per week: Not on file    Minutes per session: Not on file  . Stress: Not on file  Relationships  . Social connections:    Talks on phone: Not on file    Gets together: Not on file    Attends religious service: Not on file    Active member of club or organization: Not on file    Attends meetings of clubs or organizations: Not on file    Relationship status: Not on file  Other Topics Concern  .  Not on file  Social History Narrative   Lives with daughter and son-in-law.   Right-handed.   Drinks one soda per day.    Outpatient Encounter Medications as of 04/10/2018  Medication Sig  . Calcium Carbonate (CALCIUM 600 PO) Take 600 mg by mouth 2 (two) times daily.  . Cholecalciferol (VITAMIN D3) 10000 units TABS Take 1,000 mg by mouth 2 (two) times daily.  Marland Kitchen donepezil (ARICEPT) 5 MG tablet TAKE 1 TABLET (5 MG TOTAL) BY MOUTH AT BEDTIME. (Patient taking differently: Take 5 mg by mouth every morning. )  . IRON PO Take 65 mg by mouth daily with lunch.  . levobunolol (BETAGAN) 0.5 % ophthalmic solution Place 1 drop into both eyes 2 (two) times daily.  Marland Kitchen levothyroxine (SYNTHROID, LEVOTHROID) 25 MCG tablet TAKE 1 TABLET (25 MCG TOTAL) BY MOUTH DAILY BEFORE BREAKFAST.  Marland Kitchen losartan (COZAAR) 25 MG tablet Take 1 tablet by mouth once daily for blood pressure.  . propranolol  (INDERAL) 60 MG tablet TAKE 1 TABLET BY MOUTH TWICE DAILY FOR TREMORS.  Marland Kitchen albuterol (PROVENTIL HFA;VENTOLIN HFA) 108 (90 Base) MCG/ACT inhaler Inhale 2 puffs into the lungs every 6 (six) hours as needed for shortness of breath. (Patient not taking: Reported on 04/10/2018)   No facility-administered encounter medications on file as of 04/10/2018.     Activities of Daily Living In your present state of health, do you have any difficulty performing the following activities: 04/10/2018  Hearing? Y  Vision? N  Difficulty concentrating or making decisions? Y  Walking or climbing stairs? Y  Dressing or bathing? N  Doing errands, shopping? Y  Preparing Food and eating ? Y  Using the Toilet? N  In the past six months, have you accidently leaked urine? Y  Do you have problems with loss of bowel control? N  Managing your Medications? Y  Managing your Finances? Y  Housekeeping or managing your Housekeeping? Y  Some recent data might be hidden    Patient Care Team: Pleas Koch, NP as PCP - General (Internal Medicine)    Assessment:   This is a routine wellness examination for Hart.  Hearing Screening Comments: Hearing aid (single ear only) Vision Screening Comments: Vision exam in 2018 @ [facility] and Dietary recommendations Current Exercise Habits: The patient does not participate in regular exercise at present, Exercise limited by: None identified  Goals    . Patient Stated     Starting 04/10/2018, I will continue to take medications as prescribed.        Fall Risk Fall Risk  04/10/2018 10/17/2017  Falls in the past year? No No   Depression Screen PHQ 2/9 Scores 04/10/2018  PHQ - 2 Score 0  PHQ- 9 Score 0     Cognitive Function  MMSE - Mini Mental State Exam 04/10/2018  Not completed: (No Data)  Note: diagnosis of dementia      Immunization History  Administered Date(s) Administered  . Influenza,inj,Quad PF,6+ Mos  04/10/2016, 03/29/2017, 04/10/2018  . Pneumococcal Conjugate-13 03/19/2014  . Pneumococcal Polysaccharide-23 03/20/2015  . Tdap 06/19/2008    Screening Tests Health Maintenance  Topic Date Due  . TETANUS/TDAP  06/19/2018  . INFLUENZA VACCINE  Completed  . DEXA SCAN  Completed  . PNA vac Low Risk Adult  Completed      Plan:     I have personally reviewed, addressed, and noted the following in the patient's chart:  A. Medical and social history B. Use  of alcohol, tobacco or illicit drugs  C. Current medications and supplements D. Functional ability and status E.  Nutritional status F.  Physical activity G. Advance directives H. List of other physicians I.  Hospitalizations, surgeries, and ER visits in previous 12 months J.  [place] to include hearing, vision, cognitive, depression L. Referrals and appointments - none  In addition, I have reviewed and discussed with patient certain preventive protocols, quality metrics, and best practice recommendations. A written personalized care plan for preventive services as well as general preventive health recommendations were provided to patient.  See attached scanned questionnaire for additional information.   Signed,   Lindell Noe, MHA, BS, LPN Health Coach

## 2018-04-10 NOTE — Progress Notes (Signed)
Subjective:    Patient ID: Nicole Downs, female    DOB: 1923-08-01, 84 y.o.   MRN: [id number]  HPI  Nicole Downs is a 84 year old female who presents today for [place] Part 2. She saw our health advisor this morning.  1) Essential Tremor: Currently managed on propranolol 60 mg BID. She continues to experience tremors, isn't sure if the medication is helping. She is not interested in seeing neurology.   2) Pulmonary Emphysema: Currently managed on albuterol HFA but she cannot squeeze the inhaler. She denies shortness of breath, wheezing, cough.   3) Essential Hypertension: Currently managed on losartan 25 mg and propranolol 60 mg BID (tremor). She denies chest pain, dizziness.   BP Readings from Last 3 Encounters:  04/10/18 130/78  04/10/18 130/78  10/17/17 (!) 142/70   4) Hypothyroidism: Currently managed on levothyroxine 25 mcg. Her last TSH was 4.03 in April 2019. She is taking her levothyroxine every morning with water only, no food or other medications for 30 minutes.   Immunizations: -Tetanus: Completed in 2010 -Influenza: Completed today -Pneumonia: Completed Prevnar in 2015, Pneumovax in 2016  Eye exam: Completed in 2018 Dental exam: No recent exam  Colonoscopy: Declines due to age Dexa: Completed last in 2015 Mammogram: Last completed in 2016, declines given age    Review of Systems  Constitutional: Negative for unexpected weight change.  HENT: Negative for rhinorrhea.   Respiratory: Negative for cough and shortness of breath.   Cardiovascular: Negative for chest pain.  Gastrointestinal: Negative for constipation and diarrhea.  Genitourinary: Negative for difficulty urinating and menstrual problem.  Musculoskeletal: Negative for arthralgias and myalgias.  Skin: Negative for rash.  Allergic/Immunologic: Negative for environmental allergies.  Neurological: Negative for dizziness, numbness and headaches.  Psychiatric/Behavioral:       Decreased motivation to do  anything, sits or sleeps during the day.        Past Medical History:  Diagnosis Date  . Acute confusion   . Arthritis   . Emphysema lung ([place])   . Glaucoma   . Hypertension   . Hyponatremia   . Hypothyroidism   . Memory loss   . Poor peripheral circulation   . Tremors of nervous system      Social History   Socioeconomic History  . Marital status: Single    Spouse name: Not on file  . Number of children: 1  . Years of education: 11th grade  . Highest education level: Not on file  Occupational History  . Occupation: Retired  Scientific laboratory technician  . Financial resource strain: Not on file  . Food insecurity:    Worry: Not on file    Inability: Not on file  . Transportation needs:    Medical: Not on file    Non-medical: Not on file  Tobacco Use  . Smoking status: Never Smoker  . Smokeless tobacco: Never Used  Substance and Sexual Activity  . Alcohol use: No    Alcohol/week: 0.0 standard drinks  . Drug use: No  . Sexual activity: Not Currently  Lifestyle  . Physical activity:    Days per week: Not on file    Minutes per session: Not on file  . Stress: Not on file  Relationships  . Social connections:    Talks on phone: Not on file    Gets together: Not on file    Attends religious service: Not on file    Active member of club or organization: Not on file  Attends meetings of clubs or organizations: Not on file    Relationship status: Not on file  . Intimate partner violence:    Fear of current or ex partner: Not on file    Emotionally abused: Not on file    Physically abused: Not on file    Forced sexual activity: Not on file  Other Topics Concern  . Not on file  Social History Narrative   Lives with daughter and son-in-law.   Right-handed.   Drinks one soda per day.    Past Surgical History:  Procedure Laterality Date  . BREAST BIOPSY      Family History  Problem Relation Age of Onset  . Heart attack Mother   . Other Father        unsure - she was  only 51 years old when he died    Allergies  Allergen Reactions  . Amlodipine Other (See Comments)    Ankle edema    Current Outpatient Medications on File Prior to Visit  Medication Sig Dispense Refill  . Calcium Carbonate (CALCIUM 600 PO) Take 600 mg by mouth 2 (two) times daily.    . Cholecalciferol (VITAMIN D3) 10000 units TABS Take 1,000 mg by mouth 2 (two) times daily.    Marland Kitchen donepezil (ARICEPT) 5 MG tablet TAKE 1 TABLET (5 MG TOTAL) BY MOUTH AT BEDTIME. (Patient taking differently: Take 5 mg by mouth every morning. ) 90 tablet 1  . IRON PO Take 65 mg by mouth daily with lunch.    . levobunolol (BETAGAN) 0.5 % ophthalmic solution Place 1 drop into both eyes 2 (two) times daily. 15 mL 1  . levothyroxine (SYNTHROID, LEVOTHROID) 25 MCG tablet TAKE 1 TABLET (25 MCG TOTAL) BY MOUTH DAILY BEFORE BREAKFAST. 90 tablet 1  . losartan (COZAAR) 25 MG tablet Take 1 tablet by mouth once daily for blood pressure. 90 tablet 2  . propranolol (INDERAL) 60 MG tablet TAKE 1 TABLET BY MOUTH TWICE DAILY FOR TREMORS. 180 tablet 0  . albuterol (PROVENTIL HFA;VENTOLIN HFA) 108 (90 Base) MCG/ACT inhaler Inhale 2 puffs into the lungs every 6 (six) hours as needed for shortness of breath. (Patient not taking: Reported on 04/10/2018) 1 Inhaler 0   No current facility-administered medications on file prior to visit.     BP 130/78 (BP Location: Right Arm, Patient Position: Sitting, Cuff Size: Normal)   Pulse 65   Temp 97.8 F (36.6 C) (Oral)   Ht 5' (1.524 m) Comment: shoes  Wt 129 lb (58.5 kg)   SpO2 96%   BMI 25.19 kg/m    Objective:   Physical Exam  Constitutional: She is oriented to person, place, and time. She appears well-nourished.  HENT:  Mouth/Throat: No oropharyngeal exudate.  Eyes: Pupils are equal, round, and reactive to light. EOM are normal.  Neck: Neck supple. No thyromegaly present.  Cardiovascular: Normal rate and regular rhythm.  Respiratory: Effort normal and breath sounds normal.    GI: Soft. Bowel sounds are normal. There is no tenderness.  Musculoskeletal: Normal range of motion.  Neurological: She is alert and oriented to person, place, and time.  Resting Tremor of head   Skin: Skin is warm and dry.  Psychiatric: She has a normal mood and affect.           Assessment & Plan:

## 2018-04-10 NOTE — Assessment & Plan Note (Signed)
Exam unremarkable.  Not using albuterol, doesn't feel she needs it.  Continue to monitor.

## 2018-04-10 NOTE — Progress Notes (Signed)
I reviewed health advisor's note, was available for consultation, and agree with documentation and plan.  

## 2018-04-10 NOTE — Assessment & Plan Note (Signed)
Stable in the office today, continue losartan 25 mg. 

## 2018-04-10 NOTE — Telephone Encounter (Signed)
[facility] lab called a critical Vit D level - 104.24. Results given to Alma Friendly, .

## 2018-04-11 ENCOUNTER — Other Ambulatory Visit: Payer: Self-pay | Admitting: Primary Care

## 2018-04-11 DIAGNOSIS — G250 Essential tremor: Secondary | ICD-10-CM

## 2018-04-16 ENCOUNTER — Other Ambulatory Visit: Payer: Self-pay | Admitting: Primary Care

## 2018-04-16 NOTE — Telephone Encounter (Signed)
Rx refill request

## 2018-04-16 NOTE — Telephone Encounter (Signed)
Copied from [place] [id number]. Topic: Quick Communication - Rx Refill/Question >> Apr 16, 2018  9:29 AM Celedonio Savage L wrote: Medication: losartan (COZAAR) 25 MG tablet propranolol (INDERAL) 60 MG tablet pharmacy states that they hadn't received this medicine  Has the patient contacted their pharmacy? Yes.   (Agent: If no, request that the patient contact the pharmacy for the refill.) (Agent: If yes, when and what did the pharmacy advise?)  Preferred Pharmacy (with phone number or street name    [id number] - [place], [place] [phone] (Phone) [phone] (Fax)    Agent: Please be advised that RX refills may take up to 3 business days. We ask that you follow-up with your pharmacy.

## 2018-04-16 NOTE — Telephone Encounter (Signed)
Spoke to pt's daughter. She has taken care of the meds already

## 2018-05-01 ENCOUNTER — Emergency Department (HOSPITAL_COMMUNITY): Payer: Medicare Other

## 2018-05-01 ENCOUNTER — Other Ambulatory Visit: Payer: Self-pay | Admitting: Primary Care

## 2018-05-01 ENCOUNTER — Other Ambulatory Visit: Payer: Self-pay

## 2018-05-01 ENCOUNTER — Inpatient Hospital Stay (HOSPITAL_COMMUNITY)
Admission: EM | Admit: 2018-05-01 | Discharge: 2018-05-04 | DRG: 392 | Disposition: A | Discharge status: Home Health | Payer: Medicare Other | Attending: Internal Medicine | Admitting: Internal Medicine

## 2018-05-01 ENCOUNTER — Encounter (HOSPITAL_COMMUNITY): Payer: Self-pay | Admitting: *Deleted

## 2018-05-01 DIAGNOSIS — I10 Essential (primary) hypertension: Secondary | ICD-10-CM | POA: Diagnosis present

## 2018-05-01 DIAGNOSIS — R1013 Epigastric pain: Secondary | ICD-10-CM

## 2018-05-01 DIAGNOSIS — E039 Hypothyroidism, unspecified: Secondary | ICD-10-CM | POA: Diagnosis present

## 2018-05-01 DIAGNOSIS — F0390 Unspecified dementia without behavioral disturbance: Secondary | ICD-10-CM | POA: Diagnosis present

## 2018-05-01 DIAGNOSIS — N390 Urinary tract infection, site not specified: Secondary | ICD-10-CM | POA: Diagnosis present

## 2018-05-01 DIAGNOSIS — N183 Chronic kidney disease, stage 3 (moderate): Secondary | ICD-10-CM | POA: Diagnosis present

## 2018-05-01 DIAGNOSIS — Z79890 Hormone replacement therapy (postmenopausal): Secondary | ICD-10-CM

## 2018-05-01 DIAGNOSIS — H409 Unspecified glaucoma: Secondary | ICD-10-CM | POA: Diagnosis present

## 2018-05-01 DIAGNOSIS — K5712 Diverticulitis of small intestine without perforation or abscess without bleeding: Secondary | ICD-10-CM | POA: Diagnosis not present

## 2018-05-01 DIAGNOSIS — K5730 Diverticulosis of large intestine without perforation or abscess without bleeding: Secondary | ICD-10-CM | POA: Diagnosis not present

## 2018-05-01 DIAGNOSIS — G250 Essential tremor: Secondary | ICD-10-CM | POA: Diagnosis present

## 2018-05-01 DIAGNOSIS — Z888 Allergy status to other drugs, medicaments and biological substances status: Secondary | ICD-10-CM

## 2018-05-01 DIAGNOSIS — J439 Emphysema, unspecified: Secondary | ICD-10-CM | POA: Diagnosis not present

## 2018-05-01 DIAGNOSIS — I129 Hypertensive chronic kidney disease with stage 1 through stage 4 chronic kidney disease, or unspecified chronic kidney disease: Secondary | ICD-10-CM | POA: Diagnosis present

## 2018-05-01 DIAGNOSIS — R0902 Hypoxemia: Secondary | ICD-10-CM | POA: Diagnosis present

## 2018-05-01 DIAGNOSIS — K5792 Diverticulitis of intestine, part unspecified, without perforation or abscess without bleeding: Secondary | ICD-10-CM | POA: Diagnosis present

## 2018-05-01 DIAGNOSIS — Z8619 Personal history of other infectious and parasitic diseases: Secondary | ICD-10-CM

## 2018-05-01 DIAGNOSIS — Z79899 Other long term (current) drug therapy: Secondary | ICD-10-CM

## 2018-05-01 DIAGNOSIS — R110 Nausea: Secondary | ICD-10-CM | POA: Diagnosis not present

## 2018-05-01 DIAGNOSIS — R109 Unspecified abdominal pain: Secondary | ICD-10-CM | POA: Diagnosis not present

## 2018-05-01 DIAGNOSIS — R7989 Other specified abnormal findings of blood chemistry: Secondary | ICD-10-CM | POA: Diagnosis present

## 2018-05-01 DIAGNOSIS — Z8249 Family history of ischemic heart disease and other diseases of the circulatory system: Secondary | ICD-10-CM

## 2018-05-01 DIAGNOSIS — D631 Anemia in chronic kidney disease: Secondary | ICD-10-CM | POA: Diagnosis present

## 2018-05-01 LAB — LIPASE, BLOOD: Lipase: 34 U/L (ref 11–51)

## 2018-05-01 LAB — COMPREHENSIVE METABOLIC PANEL
ALBUMIN: 3.7 g/dL (ref 3.5–5.0)
ALK PHOS: 56 U/L (ref 38–126)
ALT: 16 U/L (ref 0–44)
ANION GAP: 10 (ref 5–15)
AST: 24 U/L (ref 15–41)
BUN: 21 mg/dL (ref 8–23)
CHLORIDE: 103 mmol/L (ref 98–111)
CO2: 24 mmol/L (ref 22–32)
Calcium: 9.8 mg/dL (ref 8.9–10.3)
Creatinine, Ser: 1.25 mg/dL — ABNORMAL HIGH (ref 0.44–1.00)
GFR calc Af Amer: 42 mL/min — ABNORMAL LOW (ref >60)
GFR calc non Af Amer: 36 mL/min — ABNORMAL LOW (ref >60)
GLUCOSE: 116 mg/dL — AB (ref 70–99)
POTASSIUM: 4.4 mmol/L (ref 3.5–5.1)
SODIUM: 137 mmol/L (ref 135–145)
Total Bilirubin: 1.1 mg/dL (ref 0.3–1.2)
Total Protein: 7.6 g/dL (ref 6.5–8.1)

## 2018-05-01 LAB — CBC
HCT: 41.1 % (ref 36.0–46.0)
Hemoglobin: 13.4 g/dL (ref 12.0–15.0)
MCH: 31.6 pg (ref 26.0–34.0)
MCHC: 32.6 g/dL (ref 30.0–36.0)
MCV: 96.9 fL (ref 80.0–100.0)
NRBC: 0 % (ref 0.0–0.2)
Platelets: 242 10*3/uL (ref 150–400)
RBC: 4.24 MIL/uL (ref 3.87–5.11)
RDW: 12.7 % (ref 11.5–15.5)
WBC: 13.6 10*3/uL — ABNORMAL HIGH (ref 4.0–10.5)

## 2018-05-01 LAB — I-STAT CHEM 8, ED
BUN: 22 mg/dL (ref 8–23)
CREATININE: 1.3 mg/dL — AB (ref 0.44–1.00)
Calcium, Ion: 1.18 mmol/L (ref 1.15–1.40)
Chloride: 102 mmol/L (ref 98–111)
GLUCOSE: 117 mg/dL — AB (ref 70–99)
HCT: 42 % (ref 36.0–46.0)
HEMOGLOBIN: 14.3 g/dL (ref 12.0–15.0)
POTASSIUM: 4.3 mmol/L (ref 3.5–5.1)
Sodium: 138 mmol/L (ref 135–145)
TCO2: 26 mmol/L (ref 22–32)

## 2018-05-01 LAB — I-STAT TROPONIN, ED: Troponin i, poc: 0 ng/mL (ref 0.00–0.08)

## 2018-05-01 MED ORDER — IOHEXOL 300 MG/ML  SOLN
80.0000 mL | Freq: Once | INTRAMUSCULAR | Status: AC | PRN
  Administered 2018-05-01: 80 mL via INTRAVENOUS

## 2018-05-01 MED ORDER — MORPHINE SULFATE (PF) 2 MG/ML IV SOLN
2.0000 mg | Freq: Once | INTRAVENOUS | Status: AC
  Administered 2018-05-01: 2 mg via INTRAVENOUS
  Filled 2018-05-01: qty 1

## 2018-05-01 MED ORDER — ONDANSETRON HCL 4 MG/2ML IJ SOLN
4.0000 mg | Freq: Once | INTRAMUSCULAR | Status: AC
  Administered 2018-05-01: 4 mg via INTRAVENOUS
  Filled 2018-05-01: qty 2

## 2018-05-01 NOTE — ED Notes (Signed)
To ct

## 2018-05-01 NOTE — ED Triage Notes (Signed)
Pt reports epigastric pain that started today. Per family, pt was eating lunch today ([place], tomato sandwich), became very flushed today with increased nausea, denies vomiting. Has had 3 normal BMs  Today as well, denies urinary symptoms. Pt reports increased weight gain in her abd the past few days as well
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
## 2018-05-01 NOTE — ED Notes (Signed)
Pt returned from ct

## 2018-05-01 NOTE — ED Provider Notes (Signed)
11:42 PM Assumed care from Dr. Tyrone Nine, please see their note for full history, physical and decision making until this point. In brief this is a 84 y.o. year old female who presented to the ED tonight with Abdominal Pain     84 year old female with acute onset of epigastric discomfort after a meal.  Not significantly tender here but apparently was prior to arrival.  Vital signs overall unremarkable but does have a mildly elevated temperature of 9.8, hypoxia of 93% and leukocytosis.  She is doing well pending CT scan for reevaluation and disposition.  Likely discharge of CT scans okay and she is asymptomatic however if not able to be pain controlled may need admission for further work-up.  Found to have diverticulitis and UTI. Abx started. Pain not controlled well, 92, leukocytosis all RF's for poor outcome, will admit for continued antibiotics and further observation.   Labs, studies and imaging reviewed by myself and considered in medical decision making if ordered. Imaging interpreted by radiology.  Labs Reviewed  COMPREHENSIVE METABOLIC PANEL - Abnormal; Notable for the following components:      Result Value   Glucose, Bld 116 (*)    Creatinine, Ser 1.25 (*)    GFR calc non Af Amer 36 (*)    GFR calc Af Amer 42 (*)    All other components within normal limits  CBC - Abnormal; Notable for the following components:   WBC 13.6 (*)    All other components within normal limits  I-STAT CHEM 8, ED - Abnormal; Notable for the following components:   Creatinine, Ser 1.30 (*)    Glucose, Bld 117 (*)    All other components within normal limits  LIPASE, BLOOD  URINALYSIS, ROUTINE W REFLEX MICROSCOPIC  I-STAT TROPONIN, ED    CT ABDOMEN PELVIS W CONTRAST    (Results Pending)    No follow-ups on file.    Merrily Pew, MD 05/02/18 [phone]

## 2018-05-02 DIAGNOSIS — R1013 Epigastric pain: Secondary | ICD-10-CM | POA: Diagnosis not present

## 2018-05-02 DIAGNOSIS — K5792 Diverticulitis of intestine, part unspecified, without perforation or abscess without bleeding: Secondary | ICD-10-CM | POA: Diagnosis not present

## 2018-05-02 DIAGNOSIS — G250 Essential tremor: Secondary | ICD-10-CM | POA: Diagnosis not present

## 2018-05-02 DIAGNOSIS — F0390 Unspecified dementia without behavioral disturbance: Secondary | ICD-10-CM

## 2018-05-02 DIAGNOSIS — I10 Essential (primary) hypertension: Secondary | ICD-10-CM | POA: Diagnosis not present

## 2018-05-02 LAB — CBC
HCT: 34 % — ABNORMAL LOW (ref 36.0–46.0)
Hemoglobin: 11.1 g/dL — ABNORMAL LOW (ref 12.0–15.0)
MCH: 31.4 pg (ref 26.0–34.0)
MCHC: 32.6 g/dL (ref 30.0–36.0)
MCV: 96.3 fL (ref 80.0–100.0)
NRBC: 0 % (ref 0.0–0.2)
PLATELETS: 215 10*3/uL (ref 150–400)
RBC: 3.53 MIL/uL — ABNORMAL LOW (ref 3.87–5.11)
RDW: 12.8 % (ref 11.5–15.5)
WBC: 11.9 10*3/uL — AB (ref 4.0–10.5)

## 2018-05-02 LAB — BASIC METABOLIC PANEL
Anion gap: 10 (ref 5–15)
BUN: 20 mg/dL (ref 8–23)
CALCIUM: 8.8 mg/dL — AB (ref 8.9–10.3)
CHLORIDE: 102 mmol/L (ref 98–111)
CO2: 24 mmol/L (ref 22–32)
CREATININE: 1.35 mg/dL — AB (ref 0.44–1.00)
GFR calc non Af Amer: 33 mL/min — ABNORMAL LOW (ref >60)
GFR, EST AFRICAN AMERICAN: 38 mL/min — AB (ref >60)
Glucose, Bld: 102 mg/dL — ABNORMAL HIGH (ref 70–99)
Potassium: 3.8 mmol/L (ref 3.5–5.1)
SODIUM: 136 mmol/L (ref 135–145)

## 2018-05-02 LAB — URINALYSIS, ROUTINE W REFLEX MICROSCOPIC
Bilirubin Urine: NEGATIVE
GLUCOSE, UA: NEGATIVE mg/dL
Hgb urine dipstick: NEGATIVE
Ketones, ur: NEGATIVE mg/dL
Nitrite: NEGATIVE
PH: 5 (ref 5.0–8.0)
Protein, ur: 30 mg/dL — AB
SPECIFIC GRAVITY, URINE: 1.024 (ref 1.005–1.030)
WBC, UA: >50 WBC/hpf — ABNORMAL HIGH (ref 0–5)

## 2018-05-02 MED ORDER — ACETAMINOPHEN 650 MG RE SUPP: 650.0000 mg | Freq: Four times a day (QID) | RECTAL | Status: DC | PRN

## 2018-05-02 MED ORDER — ENOXAPARIN SODIUM 30 MG/0.3ML ~~LOC~~ SOLN
30.0000 mg | Freq: Every day | SUBCUTANEOUS | Status: DC
  Administered 2018-05-02 – 2018-05-04 (×3): 30 mg via SUBCUTANEOUS
  Filled 2018-05-02 (×3): qty 0.3

## 2018-05-02 MED ORDER — MORPHINE SULFATE (PF) 2 MG/ML IV SOLN: 2.0000 mg | INTRAVENOUS | Status: DC | PRN

## 2018-05-02 MED ORDER — LEVOTHYROXINE SODIUM 25 MCG PO TABS
25.0000 ug | ORAL_TABLET | Freq: Every day | ORAL | Status: DC
  Administered 2018-05-02 – 2018-05-04 (×3): 25 ug via ORAL
  Filled 2018-05-02 (×3): qty 1

## 2018-05-02 MED ORDER — LACTATED RINGERS IV BOLUS: 500.0000 mL | Freq: Once | INTRAVENOUS | Status: DC

## 2018-05-02 MED ORDER — SODIUM CHLORIDE 0.9 % IV SOLN
2.0000 g | Freq: Every day | INTRAVENOUS | Status: DC
  Administered 2018-05-02 – 2018-05-03 (×3): 2 g via INTRAVENOUS
  Filled 2018-05-02 (×3): qty 20

## 2018-05-02 MED ORDER — LEVOBUNOLOL HCL 0.5 % OP SOLN
1.0000 [drp] | Freq: Two times a day (BID) | OPHTHALMIC | Status: DC
  Administered 2018-05-02 – 2018-05-04 (×4): 1 [drp] via OPHTHALMIC
  Filled 2018-05-02: qty 5

## 2018-05-02 MED ORDER — METRONIDAZOLE IN NACL 5-0.79 MG/ML-% IV SOLN
500.0000 mg | Freq: Three times a day (TID) | INTRAVENOUS | Status: DC
  Administered 2018-05-02 – 2018-05-03 (×4): 500 mg via INTRAVENOUS
  Filled 2018-05-02 (×5): qty 100

## 2018-05-02 MED ORDER — DONEPEZIL HCL 5 MG PO TABS
5.0000 mg | ORAL_TABLET | Freq: Every day | ORAL | Status: DC
  Administered 2018-05-02 – 2018-05-03 (×2): 5 mg via ORAL
  Filled 2018-05-02 (×2): qty 1

## 2018-05-02 MED ORDER — CIPROFLOXACIN IN D5W 400 MG/200ML IV SOLN: 400.0000 mg | Freq: Once | INTRAVENOUS | Status: DC

## 2018-05-02 MED ORDER — LOSARTAN POTASSIUM 25 MG PO TABS: 25.0000 mg | ORAL_TABLET | Freq: Every day | ORAL | Status: DC

## 2018-05-02 MED ORDER — ONDANSETRON HCL 4 MG PO TABS: 4.0000 mg | ORAL_TABLET | Freq: Four times a day (QID) | ORAL | Status: DC | PRN

## 2018-05-02 MED ORDER — METRONIDAZOLE 500 MG PO TABS: 500.0000 mg | ORAL_TABLET | Freq: Once | ORAL | Status: DC

## 2018-05-02 MED ORDER — CIPROFLOXACIN HCL 500 MG PO TABS: 500.0000 mg | ORAL_TABLET | Freq: Once | ORAL | Status: DC

## 2018-05-02 MED ORDER — SERTRALINE HCL 25 MG PO TABS
25.0000 mg | ORAL_TABLET | Freq: Every day | ORAL | Status: DC
  Administered 2018-05-02 – 2018-05-03 (×2): 25 mg via ORAL
  Filled 2018-05-02 (×2): qty 1

## 2018-05-02 MED ORDER — ACETAMINOPHEN 325 MG PO TABS
650.0000 mg | ORAL_TABLET | Freq: Four times a day (QID) | ORAL | Status: DC | PRN
  Administered 2018-05-03: 650 mg via ORAL
  Filled 2018-05-02: qty 2

## 2018-05-02 MED ORDER — PROPRANOLOL HCL 60 MG PO TABS
60.0000 mg | ORAL_TABLET | Freq: Two times a day (BID) | ORAL | Status: DC
  Administered 2018-05-02 – 2018-05-04 (×5): 60 mg via ORAL
  Filled 2018-05-02 (×5): qty 1

## 2018-05-02 MED ORDER — FENTANYL CITRATE (PF) 100 MCG/2ML IJ SOLN: 50.0000 ug | Freq: Once | INTRAMUSCULAR | Status: DC

## 2018-05-02 MED ORDER — ONDANSETRON HCL 4 MG/2ML IJ SOLN: 4.0000 mg | Freq: Four times a day (QID) | INTRAMUSCULAR | Status: DC | PRN

## 2018-05-02 MED ORDER — METRONIDAZOLE IN NACL 5-0.79 MG/ML-% IV SOLN: 500.0000 mg | Freq: Once | INTRAVENOUS | Status: DC

## 2018-05-02 NOTE — Progress Notes (Signed)
Patient arrived from ED to unit. Daughter at bedside. Patient alert and oriented.

## 2018-05-02 NOTE — Evaluation (Signed)
Physical Therapy Evaluation Patient Details Name: Nicole Downs MRN: [id number] DOB: 06-09-1924 Today's Date: 05/02/2018   History of Present Illness  Pt is a 84 y/o female admitted secondary to worsening abdominal pain. Found to have diverticulitis and also UTI. PMH includes dementia and HTN.   Clinical Impression  Pt admitted secondary to problem above with deficits below. Pt with dementia at baseline, so unsure of accuracy of PLOF/home info. Pt unsteady and ambulates with very flexed posture. Required min A for steadying assist throughout. Pt reports she lives with her daughter, however, unsure if daughter is able to provide 24/7 assist. If daughter unable to provide 24/7 assist at d/c, pt will likely require SNF at d/c. However, if daughter able to provide 24/7 may be able to progress to home with HHPT. Will continue to follow acutely and update recommendations based upon pt progress.     Follow Up Recommendations Supervision/Assistance - 24 hour(SNF vs HHPT )    Equipment Recommendations  Rolling walker with 5" wheels    Recommendations for Other Services       Precautions / Restrictions Precautions Precautions: Fall Restrictions Weight Bearing Restrictions: No      Mobility  Bed Mobility Overal bed mobility: Needs Assistance Bed Mobility: Supine to Sit;Sit to Supine     Supine to sit: Supervision Sit to supine: Supervision   General bed mobility comments: Supervision and increased time required to perform bed mobility tasks. Cues for sequencing required as well.   Transfers Overall transfer level: Needs assistance Equipment used: None Transfers: Sit to/from Stand Sit to Stand: Min assist         General transfer comment: Min A for steadying assist to stand. Pt with very flexed posture in standing.   Ambulation/Gait Ambulation/Gait assistance: Min assist Gait Distance (Feet): 20 Feet Assistive device: None Gait Pattern/deviations: Step-through  pattern;Decreased stride length;Shuffle;Trunk flexed Gait velocity: Decreased    General Gait Details: Very flexed posture during gait. Required min A for steadying assist throughout gait. Pt may be close to baseline ambulation, however, unsure as no family present during session. Pt sleepy and requesting to return to bed following session.   Stairs            Wheelchair Mobility    Modified Rankin (Stroke Patients Only)       Balance Overall balance assessment: Needs assistance Sitting-balance support: No upper extremity supported;Feet supported Sitting balance-Leahy Scale: Fair     Standing balance support: No upper extremity supported;During functional activity Standing balance-Leahy Scale: Poor Standing balance comment: Reliant on external support for mobility tasks.                              Pertinent Vitals/Pain Pain Assessment: No/denies pain    Home Living Family/patient expects to be discharged to:: Private residence Living Arrangements: Children Available Help at Discharge: Family Type of Home: House Home Access: Stairs to enter   Technical brewer of Steps: 2 Home Layout: One level Home Equipment: Environmental consultant - 2 wheels;Cane - single point Additional Comments: Unsure of accuracy as no family present during session.     Prior Function Level of Independence: Independent with assistive device(s)         Comments: Pt reports she used cane or RW for mobility, however, says she was independent with ADL tasks. No family present to determine accuracy.      Hand Dominance        Extremity/Trunk Assessment  Upper Extremity Assessment Upper Extremity Assessment: Defer to OT evaluation    Lower Extremity Assessment Lower Extremity Assessment: Generalized weakness    Cervical / Trunk Assessment Cervical / Trunk Assessment: Kyphotic;Other exceptions Cervical / Trunk Exceptions: tremors noted in trunk  Communication   Communication:  HOH  Cognition Arousal/Alertness: Awake/alert Behavior During Therapy: WFL for tasks assessed/performed Overall Cognitive Status: No family/caregiver present to determine baseline cognitive functioning                                 General Comments: History of dementia, however, no family present to determine baseline.       General Comments      Exercises     Assessment/Plan    PT Assessment Patient needs continued PT services  PT Problem List Decreased strength;Decreased cognition;Decreased balance;Decreased mobility;Decreased knowledge of use of DME;Decreased knowledge of precautions       PT Treatment Interventions DME instruction;Gait training;Functional mobility training;Stair training;Therapeutic activities;Therapeutic exercise;Balance training;Patient/family education;Cognitive remediation    PT Goals (Current goals can be found in the Care Plan section)  Acute Rehab PT Goals Patient Stated Goal: none stated PT Goal Formulation: With patient Time For Goal Achievement: 05/16/18 Potential to Achieve Goals: Fair    Frequency Min 3X/week   Barriers to discharge Other (comment) Unsure of caregiver support     Co-evaluation               AM-PAC PT "6 Clicks" Daily Activity  Outcome Measure Difficulty turning over in bed (including adjusting bedclothes, sheets and blankets)?: A Little Difficulty moving from lying on back to sitting on the side of the bed? : A Lot Difficulty sitting down on and standing up from a chair with arms (e.g., wheelchair, bedside commode, etc,.)?: Unable Help needed moving to and from a bed to chair (including a wheelchair)?: A Little Help needed walking in hospital room?: A Little Help needed climbing 3-5 steps with a railing? : A Lot 6 Click Score: 14    End of Session Equipment Utilized During Treatment: Gait belt Activity Tolerance: Patient tolerated treatment well Patient left: in bed;with call bell/phone within  reach;with bed alarm set Nurse Communication: Mobility status PT Visit Diagnosis: Unsteadiness on feet (R26.81);Muscle weakness (generalized) (M62.81)    Time: 0175-1025 PT Time Calculation (min) (ACUTE ONLY): 13 min   Charges:   PT Evaluation $PT Eval Low Complexity: [place], PT, DPT  Acute Rehabilitation Services  Pager: [phone] Office: [phone]   Rudean Hitt 05/02/2018, 4:42 PM

## 2018-05-02 NOTE — H&P (Signed)
History and Physical    Nicole Downs EHU:[id number] DOB: [birth year] DOA: 05/01/2018  PCP: Pleas Koch, NP  Patient coming from: Home  I have personally briefly reviewed patient's old medical records in [place]  Chief Complaint: Abd pain  HPI: Nicole Downs is a 84 y.o. female with medical history significant of dementia, HTN.  Patient presents to the ED with c/o abd pain.  Located in epigastric area.  Ongoing for about 4 hours or so.  Nausea but no vomiting.   ED Course: CT abd/pelvis demonstrates mild, short segement acute small bowel diverticulitis.  Pain improved significantly by the time I am seeing patient, now only has a "twinge" she states.   Review of Systems: As per HPI otherwise 10 point review of systems negative.   Past Medical History:  Diagnosis Date  . Acute confusion   . Arthritis   . Emphysema lung ([place])   . Glaucoma   . Hypertension   . Hyponatremia   . Hypothyroidism   . Memory loss   . Poor peripheral circulation   . Tremors of nervous system     Past Surgical History:  Procedure Laterality Date  . BREAST BIOPSY       reports that she has never smoked. She has never used smokeless tobacco. She reports that she does not drink alcohol or use drugs.  Allergies  Allergen Reactions  . Amlodipine Other (See Comments)    Ankle edema    Family History  Problem Relation Age of Onset  . Heart attack Mother   . Other Father        unsure - she was only 36 years old when he died     Prior to Admission medications   Medication Sig Start Date End Date Taking? Authorizing Provider  Calcium Carbonate (CALCIUM 600 PO) Take 600 mg by mouth 2 (two) times daily.   Yes [provider]  Cholecalciferol (VITAMIN D3) 10000 units TABS Take 1,000 mg by mouth 2 (two) times daily.   Yes [provider]  donepezil (ARICEPT) 5 MG tablet TAKE 1 TABLET (5 MG TOTAL) BY MOUTH AT BEDTIME. Patient taking differently: Take 5 mg  by mouth at bedtime.  11/01/17  Yes Pleas Koch, NP  IRON PO Take 65 mg by mouth daily with lunch.   Yes [provider]  levothyroxine (SYNTHROID, LEVOTHROID) 25 MCG tablet TAKE 1 TABLET (25 MCG TOTAL) BY MOUTH DAILY BEFORE BREAKFAST. Patient taking differently: Take 25 mcg by mouth daily before breakfast.  11/19/17  Yes Pleas Koch, NP  losartan (COZAAR) 25 MG tablet Take 1 tablet by mouth once daily for blood pressure. 10/17/17  Yes Pleas Koch, NP  Melatonin-Pyridoxine (MELATIN PO) Take 1 tablet by mouth at bedtime as needed (sleep).   Yes [provider]  propranolol (INDERAL) 60 MG tablet TAKE 1 TABLET BY MOUTH TWICE DAILY FOR TREMORS. Patient taking differently: Take 60 mg by mouth 2 (two) times daily. tremors. 04/12/18  Yes Pleas Koch, NP  sertraline (ZOLOFT) 25 MG tablet Take 1 tablet by mouth at bedtime for depression. 04/10/18  Yes Pleas Koch, NP    Physical Exam: Vitals:   05/01/18 2315 05/01/18 2345 05/02/18 0330 05/02/18 0332  BP: 117/76 121/71 (!) 109/58   Pulse: 69 80 67   Resp: 20 20 16    Temp:   98.2 F (36.8 C) 98.7 F (37.1 C)  TempSrc:   Oral Rectal  SpO2: 93% 93% 96%  Constitutional: NAD, calm, comfortable Eyes: PERRL, lids and conjunctivae normal ENMT: Mucous membranes are moist. Posterior pharynx clear of any exudate or lesions.Normal dentition.  Neck: normal, supple, no masses, no thyromegaly Respiratory: clear to auscultation bilaterally, no wheezing, no crackles. Normal respiratory effort. No accessory muscle use.  Cardiovascular: Regular rate and rhythm, no murmurs / rubs / gallops. No extremity edema. 2+ pedal pulses. No carotid bruits.  Abdomen: no tenderness, no masses palpated. No hepatosplenomegaly. Bowel sounds positive.  Musculoskeletal: no clubbing / cyanosis. No joint deformity upper and lower extremities. Good ROM, no contractures. Normal muscle tone.  Skin: no rashes, lesions, ulcers. No  induration Neurologic: CN 2-12 grossly intact. Sensation intact, DTR normal. Strength 5/5 in all 4. Essential tremor noted Psychiatric: Normal judgment and insight. Alert and oriented x 3. Normal mood.    Labs on Admission: I have personally reviewed following labs and imaging studies  CBC: Recent Labs  Lab 05/01/18 2134 05/01/18 2142  WBC 13.6*  --   HGB 13.4 14.3  HCT 41.1 42.0  MCV 96.9  --   PLT 242  --    Basic Metabolic Panel: Recent Labs  Lab 05/01/18 2134 05/01/18 2142  NA 137 138  K 4.4 4.3  CL 103 102  CO2 24  --   GLUCOSE 116* 117*  BUN 21 22  CREATININE 1.25* 1.30*  CALCIUM 9.8  --    GFR: CrCl cannot be calculated (Unknown ideal weight.). Liver Function Tests: Recent Labs  Lab 05/01/18 2134  AST 24  ALT 16  ALKPHOS 56  BILITOT 1.1  PROT 7.6  ALBUMIN 3.7   Recent Labs  Lab 05/01/18 2136  LIPASE 34   No results for input(s): AMMONIA in the last 168 hours. Coagulation Profile: No results for input(s): INR, PROTIME in the last 168 hours. Cardiac Enzymes: No results for input(s): CKTOTAL, CKMB, CKMBINDEX, TROPONINI in the last 168 hours. BNP (last 3 results) Recent Labs    09/27/17 1141  PROBNP 191.0*   HbA1C: No results for input(s): HGBA1C in the last 72 hours. CBG: No results for input(s): GLUCAP in the last 168 hours. Lipid Profile: No results for input(s): CHOL, HDL, LDLCALC, TRIG, CHOLHDL, LDLDIRECT in the last 72 hours. Thyroid Function Tests: No results for input(s): TSH, T4TOTAL, FREET4, T3FREE, THYROIDAB in the last 72 hours. Anemia Panel: No results for input(s): VITAMINB12, FOLATE, FERRITIN, TIBC, IRON, RETICCTPCT in the last 72 hours. Urine analysis:    Component Value Date/Time   COLORURINE YELLOW 05/01/2018 2350   APPEARANCEUR HAZY (A) 05/01/2018 2350   LABSPEC 1.024 05/01/2018 2350   PHURINE 5.0 05/01/2018 2350   GLUCOSEU NEGATIVE 05/01/2018 2350   GLUCOSEU NEGATIVE 06/24/2015 0959   HGBUR NEGATIVE 05/01/2018 2350    BILIRUBINUR NEGATIVE 05/01/2018 2350   KETONESUR NEGATIVE 05/01/2018 2350   PROTEINUR 30 (A) 05/01/2018 2350   UROBILINOGEN 0.2 06/24/2015 0959   NITRITE NEGATIVE 05/01/2018 2350   LEUKOCYTESUR LARGE (A) 05/01/2018 2350    Radiological Exams on Admission: Ct Abdomen Pelvis W Contrast  Result Date: 05/01/2018 CLINICAL DATA:  Epigastric pain and nausea today. History of emphysema and tremors. EXAM: CT ABDOMEN AND PELVIS WITH CONTRAST TECHNIQUE: Multidetector CT imaging of the abdomen and pelvis was performed using the standard protocol following bolus administration of intravenous contrast. CONTRAST:  2mL OMNIPAQUE IOHEXOL 300 MG/ML  SOLN COMPARISON:  None. FINDINGS: Mild motion degraded examination. LOWER CHEST: RIGHT lung base subpleural atelectasis/scarring. HEPATOBILIARY: Normal liver. Mild fluid distended gallbladder without inflammatory changes. PANCREAS: Nonacute, Atrophic.  SPLEEN: Normal. ADRENALS/URINARY TRACT: Kidneys are orthotopic, demonstrating symmetric enhancement. Mild symmetric cortical thinning. No nephrolithiasis, hydronephrosis or solid renal masses. 2.9 cm homogeneously hypodense benign-appearing RIGHT parapelvic cyst. The unopacified ureters are normal in course and caliber. Delayed imaging through the kidneys demonstrates symmetric prompt contrast excretion within the proximal urinary collecting system. Urinary bladder is partially distended and unremarkable. Normal adrenal glands. STOMACH/BOWEL: Moderate hiatal hernia. Mild small bowel diverticulosis with short segment of mid inflammatory changes about a loop of small bowel (coronal image 34). The large bowel are normal in course and caliber without inflammatory changes, sensitivity decreased without oral contrast. Severe descending and sigmoid colonic diverticulosis. Normal appendix. VASCULAR/LYMPHATIC: Aortoiliac vessels are normal in course and caliber. Moderate calcific atherosclerosis. No lymphadenopathy by CT size  criteria. REPRODUCTIVE: Normal. OTHER: No intraperitoneal free fluid or free air. MUSCULOSKELETAL: Nonacute. Osteopenia. Moderate old L2 compression fracture. Severe lumbar spondylosis. IMPRESSION: 1. Mild motion degraded examination. 2. Short segment acute mild small-bowel diverticulitis. Severe colonic diverticulosis. Aortic Atherosclerosis (ICD10-I70.0). Electronically Signed   By: Elon Alas M.D.   On: 05/01/2018 23:53    EKG: Independently reviewed.  Assessment/Plan Principal Problem:   Diverticulitis Active Problems:   Essential hypertension   Dementia (HCC)   Essential tremor    1. Uncomplicated diverticulitis - 1. Also ? UTI - culture ordered 2. Rocephin and flagyl 3. Morphine PRN pain, pain currently controlled 2. HTN - 1. Hold losartan given BPs running low 100s in ED 3. Dementia - continue home meds 4. Essential tremor - continue home meds  DVT prophylaxis: Lovenox Code Status: Full Family Communication: Daughter at bedside Disposition Plan: Home after admit Consults called: None Admission status: Place in [place], [place] [facility] Pager [phone] Only works nights!  If 7AM-7PM, please contact the primary day team physician taking care of patient  [email or web address] Password [email or web address]  05/02/2018, 3:41 AM

## 2018-05-02 NOTE — Progress Notes (Signed)
[facility] PROGRESS NOTE  Nicole Downs NWG:[id number] DOB: 1924/04/18 DOA: 05/01/2018  PCP: Pleas Koch, NP  Brief History/Interval Summary: 84 year old Caucasian female with a past medical history of dementia, hypertension presented with abdominal pain.  CT scan raised concern for small bowel diverticulitis.  UA was also noted to be abnormal.  She was hospitalized for further management.  Reason for Visit: Acute diverticulitis.  Urinary tract infection  Consultants: None  Procedures: None  Antibiotics: Ceftriaxone and Flagyl  Subjective/Interval History: Patient states that she feels better this morning.  Abdominal pain has improved.  Denies any nausea or vomiting.  ROS: Denies any headaches  Objective:  Vital Signs  Vitals:   05/01/18 2345 05/02/18 0330 05/02/18 0332 05/02/18 0941  BP: 121/71 (!) 109/58    Pulse: 80 67  72  Resp: 20 16    Temp:  98.2 F (36.8 C) 98.7 F (37.1 C)   TempSrc:  Oral Rectal   SpO2: 93% 96%      Intake/Output Summary (Last 24 hours) at 05/02/2018 1232 Last data filed at 05/02/2018 1009 Gross per 24 hour  Intake -  Output 100 ml  Net -100 ml   There were no vitals filed for this visit.  General appearance: alert, cooperative, appears stated age and no distress Head: Normocephalic, without obvious abnormality, atraumatic Resp: clear to auscultation bilaterally Cardio: regular rate and rhythm, S1, S2 normal, no murmur, click, rub or gallop GI: Soft.  Mildly tender in the suprapubic area without any rebound rigidity or guarding.  No masses organomegaly.  Bowel sounds present normal. Extremities: extremities normal, atraumatic, no cyanosis or edema Pulses: 2+ and symmetric Neurologic: No obvious focal neurological deficits.  Lab Results:  Data Reviewed: I have personally reviewed following labs and imaging studies  CBC: Recent Labs  Lab 05/01/18 2134 05/01/18 2142 05/02/18 0440  WBC 13.6*  --  11.9*    HGB 13.4 14.3 11.1*  HCT 41.1 42.0 34.0*  MCV 96.9  --  96.3  PLT 242  --  578    Basic Metabolic Panel: Recent Labs  Lab 05/01/18 2134 05/01/18 2142 05/02/18 0440  NA 137 138 136  K 4.4 4.3 3.8  CL 103 102 102  CO2 24  --  24  GLUCOSE 116* 117* 102*  BUN 21 22 20   CREATININE 1.25* 1.30* 1.35*  CALCIUM 9.8  --  8.8*    GFR: CrCl cannot be calculated (Unknown ideal weight.).  Liver Function Tests: Recent Labs  Lab 05/01/18 2134  AST 24  ALT 16  ALKPHOS 56  BILITOT 1.1  PROT 7.6  ALBUMIN 3.7    Recent Labs  Lab 05/01/18 2136  LIPASE 34     Radiology Studies: Ct Abdomen Pelvis W Contrast  Result Date: 05/01/2018 CLINICAL DATA:  Epigastric pain and nausea today. History of emphysema and tremors. EXAM: CT ABDOMEN AND PELVIS WITH CONTRAST TECHNIQUE: Multidetector CT imaging of the abdomen and pelvis was performed using the standard protocol following bolus administration of intravenous contrast. CONTRAST:  69mL OMNIPAQUE IOHEXOL 300 MG/ML  SOLN COMPARISON:  None. FINDINGS: Mild motion degraded examination. LOWER CHEST: RIGHT lung base subpleural atelectasis/scarring. HEPATOBILIARY: Normal liver. Mild fluid distended gallbladder without inflammatory changes. PANCREAS: Nonacute, Atrophic. SPLEEN: Normal. ADRENALS/URINARY TRACT: Kidneys are orthotopic, demonstrating symmetric enhancement. Mild symmetric cortical thinning. No nephrolithiasis, hydronephrosis or solid renal masses. 2.9 cm homogeneously hypodense benign-appearing RIGHT parapelvic cyst. The unopacified ureters are normal in course and caliber. Delayed imaging through the kidneys demonstrates symmetric prompt  contrast excretion within the proximal urinary collecting system. Urinary bladder is partially distended and unremarkable. Normal adrenal glands. STOMACH/BOWEL: Moderate hiatal hernia. Mild small bowel diverticulosis with short segment of mid inflammatory changes about a loop of small bowel (coronal image 34).  The large bowel are normal in course and caliber without inflammatory changes, sensitivity decreased without oral contrast. Severe descending and sigmoid colonic diverticulosis. Normal appendix. VASCULAR/LYMPHATIC: Aortoiliac vessels are normal in course and caliber. Moderate calcific atherosclerosis. No lymphadenopathy by CT size criteria. REPRODUCTIVE: Normal. OTHER: No intraperitoneal free fluid or free air. MUSCULOSKELETAL: Nonacute. Osteopenia. Moderate old L2 compression fracture. Severe lumbar spondylosis. IMPRESSION: 1. Mild motion degraded examination. 2. Short segment acute mild small-bowel diverticulitis. Severe colonic diverticulosis. Aortic Atherosclerosis (ICD10-I70.0). Electronically Signed   By: Elon Alas M.D.   On: 05/01/2018 23:53     Medications:  Scheduled: . donepezil  5 mg Oral QHS  . enoxaparin (LOVENOX) injection  30 mg Subcutaneous Daily  . fentaNYL (SUBLIMAZE) injection  50 mcg Intravenous Once  . levobunolol  1 drop Both Eyes BID  . levothyroxine  25 mcg Oral QAC breakfast  . propranolol  60 mg Oral BID  . sertraline  25 mg Oral QHS   Continuous: . cefTRIAXone (ROCEPHIN)  IV 2 g (05/02/18 0300)  . lactated ringers    . metronidazole 500 mg (05/02/18 0955)   KXF:[id number] **OR** acetaminophen, morphine injection, ondansetron **OR** ondansetron (ZOFRAN) IV  Assessment/Plan:    Acute diverticulitis, uncomplicated Patient symptoms appear to be improving.  Patient was placed on ceftriaxone and metronidazole which is being continued.  Continue to monitor.  Abnormal UA, possible acute cystitis Continue antibiotics as discussed above.  Follow urine culture reports.  Chronic kidney disease stage III Creatinine noted to be slightly elevated but at baseline.  Urine output.  Essential hypertension Holding losartan due to borderline low blood pressures.  She is asymptomatic.  Continue to monitor.  Essential tremor Continue propranolol.  History of  hypothyroidism Continue levothyroxine.  History of dementia Stable.  Continue home medications.  Normocytic anemia No evidence of overt bleeding.  Monitor periodically.   DVT Prophylaxis: Lovenox    Code Status: Full code Family Communication: Discussed with the patient.  No family at bedside Disposition Plan: Management as outlined above.  PT and OT evaluation.    LOS: 0 days   [place] [facility] Pager [phone] 05/02/2018, 12:32 PM  If 7PM-7AM, please contact night-coverage at [email or web address], password [facility]

## 2018-05-03 DIAGNOSIS — N183 Chronic kidney disease, stage 3 (moderate): Secondary | ICD-10-CM | POA: Diagnosis present

## 2018-05-03 DIAGNOSIS — N390 Urinary tract infection, site not specified: Secondary | ICD-10-CM | POA: Diagnosis present

## 2018-05-03 DIAGNOSIS — Z79890 Hormone replacement therapy (postmenopausal): Secondary | ICD-10-CM | POA: Diagnosis not present

## 2018-05-03 DIAGNOSIS — Z8249 Family history of ischemic heart disease and other diseases of the circulatory system: Secondary | ICD-10-CM | POA: Diagnosis not present

## 2018-05-03 DIAGNOSIS — G250 Essential tremor: Secondary | ICD-10-CM | POA: Diagnosis present

## 2018-05-03 DIAGNOSIS — J439 Emphysema, unspecified: Secondary | ICD-10-CM | POA: Diagnosis present

## 2018-05-03 DIAGNOSIS — Z888 Allergy status to other drugs, medicaments and biological substances status: Secondary | ICD-10-CM | POA: Diagnosis not present

## 2018-05-03 DIAGNOSIS — K5792 Diverticulitis of intestine, part unspecified, without perforation or abscess without bleeding: Secondary | ICD-10-CM | POA: Diagnosis not present

## 2018-05-03 DIAGNOSIS — E039 Hypothyroidism, unspecified: Secondary | ICD-10-CM | POA: Diagnosis present

## 2018-05-03 DIAGNOSIS — I10 Essential (primary) hypertension: Secondary | ICD-10-CM | POA: Diagnosis not present

## 2018-05-03 DIAGNOSIS — F0390 Unspecified dementia without behavioral disturbance: Secondary | ICD-10-CM | POA: Diagnosis present

## 2018-05-03 DIAGNOSIS — R7989 Other specified abnormal findings of blood chemistry: Secondary | ICD-10-CM | POA: Diagnosis present

## 2018-05-03 DIAGNOSIS — R109 Unspecified abdominal pain: Secondary | ICD-10-CM | POA: Diagnosis not present

## 2018-05-03 DIAGNOSIS — Z8619 Personal history of other infectious and parasitic diseases: Secondary | ICD-10-CM | POA: Diagnosis not present

## 2018-05-03 DIAGNOSIS — N179 Acute kidney failure, unspecified: Secondary | ICD-10-CM | POA: Diagnosis not present

## 2018-05-03 DIAGNOSIS — R0902 Hypoxemia: Secondary | ICD-10-CM | POA: Diagnosis present

## 2018-05-03 DIAGNOSIS — Z79899 Other long term (current) drug therapy: Secondary | ICD-10-CM | POA: Diagnosis not present

## 2018-05-03 DIAGNOSIS — H409 Unspecified glaucoma: Secondary | ICD-10-CM | POA: Diagnosis present

## 2018-05-03 DIAGNOSIS — D631 Anemia in chronic kidney disease: Secondary | ICD-10-CM | POA: Diagnosis present

## 2018-05-03 DIAGNOSIS — K5712 Diverticulitis of small intestine without perforation or abscess without bleeding: Secondary | ICD-10-CM | POA: Diagnosis present

## 2018-05-03 DIAGNOSIS — I129 Hypertensive chronic kidney disease with stage 1 through stage 4 chronic kidney disease, or unspecified chronic kidney disease: Secondary | ICD-10-CM | POA: Diagnosis present

## 2018-05-03 LAB — BASIC METABOLIC PANEL
Anion gap: 10 (ref 5–15)
BUN: 24 mg/dL — AB (ref 8–23)
CHLORIDE: 105 mmol/L (ref 98–111)
CO2: 23 mmol/L (ref 22–32)
Calcium: 8.7 mg/dL — ABNORMAL LOW (ref 8.9–10.3)
Creatinine, Ser: 1.6 mg/dL — ABNORMAL HIGH (ref 0.44–1.00)
GFR calc Af Amer: 31 mL/min — ABNORMAL LOW (ref >60)
GFR calc non Af Amer: 27 mL/min — ABNORMAL LOW (ref >60)
Glucose, Bld: 102 mg/dL — ABNORMAL HIGH (ref 70–99)
POTASSIUM: 3.8 mmol/L (ref 3.5–5.1)
SODIUM: 138 mmol/L (ref 135–145)

## 2018-05-03 LAB — CBC
HEMATOCRIT: 32.2 % — AB (ref 36.0–46.0)
HEMOGLOBIN: 10.2 g/dL — AB (ref 12.0–15.0)
MCH: 31.2 pg (ref 26.0–34.0)
MCHC: 31.7 g/dL (ref 30.0–36.0)
MCV: 98.5 fL (ref 80.0–100.0)
Platelets: 207 10*3/uL (ref 150–400)
RBC: 3.27 MIL/uL — AB (ref 3.87–5.11)
RDW: 13 % (ref 11.5–15.5)
WBC: 10.2 10*3/uL (ref 4.0–10.5)
nRBC: 0 % (ref 0.0–0.2)

## 2018-05-03 MED ORDER — SODIUM CHLORIDE 0.45 % IV SOLN
INTRAVENOUS | Status: AC
  Administered 2018-05-03 – 2018-05-04 (×2): via INTRAVENOUS

## 2018-05-03 MED ORDER — METRONIDAZOLE 500 MG PO TABS
500.0000 mg | ORAL_TABLET | Freq: Three times a day (TID) | ORAL | Status: DC
  Administered 2018-05-03 – 2018-05-04 (×3): 500 mg via ORAL
  Filled 2018-05-03 (×3): qty 1

## 2018-05-03 NOTE — Evaluation (Signed)
Occupational Therapy Evaluation Patient Details Name: Nicole Downs MRN: [id number] DOB: December 08, 1923 Today's Date: 05/03/2018    History of Present Illness Pt is a 84 y/o female admitted secondary to worsening abdominal pain. Found to have diverticulitis and also UTI. PMH includes dementia and HTN.    Clinical Impression   PT admitted with UTi and Diverticulitis. Pt currently with functional limitiations due to the deficits listed below (see OT problem list). Pt could be close to baseline currently. Pt moving quickly with RW and needs cues for safety.  Pt will benefit from skilled OT to increase their independence and safety with adls and balance to allow discharge home with daughter.     Follow Up Recommendations  No OT follow up    Equipment Recommendations  None recommended by OT    Recommendations for Other Services       Precautions / Restrictions Precautions Precautions: Fall Restrictions Weight Bearing Restrictions: No      Mobility Bed Mobility Overal bed mobility: Modified Independent Bed Mobility: Supine to Sit;Sit to Supine           General bed mobility comments: increased time and effort   Transfers Overall transfer level: Needs assistance Equipment used: None Transfers: Sit to/from Stand Sit to Stand: Min guard         General transfer comment: min guard for safety    Balance Overall balance assessment: Needs assistance Sitting-balance support: No upper extremity supported;Feet supported Sitting balance-Leahy Scale: Good     Standing balance support: During functional activity;Bilateral upper extremity supported Standing balance-Leahy Scale: Fair Standing balance comment: Reliant on external support for mobility tasks.                            ADL either performed or assessed with clinical judgement   ADL Overall ADL's : Needs assistance/impaired Eating/Feeding: Set up   Grooming: Supervision/safety;Standing    Upper Body Bathing: Minimal assistance   Lower Body Bathing: Minimal assistance           Toilet Transfer: Minimal assistance;RW   Toileting- Clothing Manipulation and Hygiene: Min guard;Sitting/lateral lean       Functional mobility during ADLs: Minimal assistance;Rolling walker General ADL Comments: pt abandons RW at times      Vision Baseline Vision/History: Wears glasses Wears Glasses: Reading only(doing word puzzles)       Perception     Praxis      Pertinent Vitals/Pain Pain Assessment: No/denies pain     Hand Dominance Right   Extremity/Trunk Assessment Upper Extremity Assessment Upper Extremity Assessment: Overall WFL for tasks assessed   Lower Extremity Assessment Lower Extremity Assessment: Defer to PT evaluation   Cervical / Trunk Assessment Cervical / Trunk Assessment: Kyphotic;Other exceptions Cervical / Trunk Exceptions: tremors noted in trunk   Communication Communication Communication: HOH   Cognition Arousal/Alertness: Awake/alert Behavior During Therapy: WFL for tasks assessed/performed Overall Cognitive Status: No family/caregiver present to determine baseline cognitive functioning Area of Impairment: Safety/judgement                         Safety/Judgement: Decreased awareness of safety     General Comments: History of dementia, however, no family present to determine baseline.    General Comments  very fast mover and running RW into items    Exercises     Shoulder Instructions      Home Living Family/patient expects to be discharged to:: Private residence  Living Arrangements: Children Available Help at Discharge: Family Type of Home: House Home Access: Stairs to enter [employer] of Steps: 2   Home Layout: One level     Bathroom Shower/Tub: Teacher, early years/pre: [place]: Environmental consultant - 2 wheels;Cane - single point;Bedside commode;Shower seat   Additional Comments:  Unsure of accuracy as no family present during session.       Prior Functioning/Environment Level of Independence: Independent with assistive device(s)        Comments: Pt reports she used cane or RW for mobility, however, says she was independent with ADL tasks. No family present to determine accuracy.         OT Problem List: Decreased strength;Decreased range of motion;Decreased activity tolerance;Impaired balance (sitting and/or standing);Decreased safety awareness;Decreased knowledge of use of DME or AE;Decreased knowledge of precautions      OT Treatment/Interventions: Self-care/ADL training;Therapeutic exercise;Energy conservation;DME and/or AE instruction;Therapeutic activities;Cognitive remediation/compensation;Patient/family education;Balance training    OT Goals(Current goals can be found in the care plan section) Acute Rehab OT Goals Patient Stated Goal: to do a word puzzle OT Goal Formulation: With patient/family Time For Goal Achievement: 05/17/18 Potential to Achieve Goals: Good  OT Frequency: Min 2X/week   Barriers to D/C:            Co-evaluation              AM-PAC PT "6 Clicks" Daily Activity     Outcome Measure Help from another person eating meals?: None Help from another person taking care of personal grooming?: A Little Help from another person toileting, which includes using toliet, bedpan, or urinal?: A Little Help from another person bathing (including washing, rinsing, drying)?: A Little Help from another person to put on and taking off regular upper body clothing?: A Little Help from another person to put on and taking off regular lower body clothing?: A Little 6 Click Score: 19   End of Session Equipment Utilized During Treatment: Rolling walker Nurse Communication: Mobility status;Precautions  Activity Tolerance: Patient tolerated treatment well Patient left: in chair;with call bell/phone within reach;with family/visitor present  OT  Visit Diagnosis: Unsteadiness on feet (R26.81)                Time: 1275-1700 OT Time Calculation (min): 31 min Charges:  OT Evaluation $OT Eval Moderate Complexity: 1 Mod   Jeri Modena, OTR/L  Acute Rehabilitation Services Pager: [phone] Office: [phone] .   Jeri Modena 05/03/2018, 3:00 PM

## 2018-05-03 NOTE — Care Management Note (Addendum)
Case Management Note  Patient Details  Name: Adam Demary MRN: [id number] Date of Birth: [birth year]  Subjective/Objective:                    Action/Plan:  Discussed discharge planning with patient's daughter Talmadge Coventry [phone].   [name] would like home health PT with [facility] , Patient has a walker at home.  Expected Discharge Date:                  Expected Discharge Plan:  [place]  In-House Referral:     Discharge planning Services  CM Consult  Post Acute Care Choice:  Home Health Choice offered to:  Adult Children  DME Arranged:  N/A DME Agency:  NA  HH Arranged:   PT/OT/aide HH Agency:  [facility] (now [facility])  Status of Service:  In process, will continue to follow  If discussed at Long Length of Stay Meetings, dates discussed:    Additional Comments:  Marilu Favre, RN 05/03/2018, 1:14 PM

## 2018-05-03 NOTE — Care Management Obs Status (Signed)
[facility] NOTIFICATION   Patient Details  Name: Nicole Downs MRN: [id number] Date of Birth: July 15, 1923   Medicare Observation Status Notification Given:   yes Called daughter Talmadge Coventry [phone] . [name] voiced understanding     Marilu Favre, RN 05/03/2018, 1:12 PM

## 2018-05-03 NOTE — Progress Notes (Addendum)
Physical Therapy Treatment Patient Details Name: Nicole Downs MRN: [id number] DOB: 05/01/1924 Today's Date: 05/03/2018    History of Present Illness Pt is a 84 y/o female admitted secondary to worsening abdominal pain. Found to have diverticulitis and also UTI. PMH includes dementia and HTN.     PT Comments    Patient seen for mobility progression. Pt is eager to mobilize and is making progress toward PT goals. Pt requires min guard/min A and use of RW for OOB mobility. Pt has history of dementia and not sure of baseline cognition. Pt does demonstrate poor safety awareness and disoriented to place. Pt will continue to benefit from further skilled PT services to maximize independence and safety with mobility and 24 hour supervision/assist.     Follow Up Recommendations  Supervision/Assistance - 24 hour;Home health PT     Equipment Recommendations  Rolling walker with 5" wheels    Recommendations for Other Services       Precautions / Restrictions Precautions Precautions: Fall    Mobility  Bed Mobility Overal bed mobility: Modified Independent Bed Mobility: Supine to Sit;Sit to Supine           General bed mobility comments: increased time and effort   Transfers Overall transfer level: Needs assistance Equipment used: None Transfers: Sit to/from Stand Sit to Stand: Min guard         General transfer comment: min guard for safety  Ambulation/Gait Ambulation/Gait assistance: Min guard Gait Distance (Feet): 200 Feet Assistive device: Rolling walker (2 wheeled) Gait Pattern/deviations: Step-through pattern;Decreased stride length;Trunk flexed Gait velocity: Decreased    General Gait Details: cues for posture and safe proximity to [employer] Mobility    Modified Rankin (Stroke Patients Only)       Balance Overall balance assessment: Needs assistance Sitting-balance support: No upper extremity supported;Feet  supported Sitting balance-Leahy Scale: Good     Standing balance support: During functional activity;Bilateral upper extremity supported Standing balance-Leahy Scale: Poor                              Cognition Arousal/Alertness: Awake/alert Behavior During Therapy: WFL for tasks assessed/performed Overall Cognitive Status: No family/caregiver present to determine baseline cognitive functioning Area of Impairment: Safety/judgement                         Safety/Judgement: Decreased awareness of safety     General Comments: History of dementia, however, no family present to determine baseline.       Exercises      General Comments        Pertinent Vitals/Pain Pain Assessment: No/denies pain    Home Living                      Prior Function            PT Goals (current goals can now be found in the care plan section) Acute Rehab PT Goals Patient Stated Goal: none stated Progress towards PT goals: Progressing toward goals    Frequency    Min 3X/week      PT Plan Current plan remains appropriate    Co-evaluation              AM-PAC PT "6 Clicks" Daily Activity  Outcome Measure  Difficulty turning over in bed (including adjusting bedclothes, sheets  and blankets)?: A Little Difficulty moving from lying on back to sitting on the side of the bed? : A Lot Difficulty sitting down on and standing up from a chair with arms (e.g., wheelchair, bedside commode, etc,.)?: Unable Help needed moving to and from a bed to chair (including a wheelchair)?: A Little Help needed walking in hospital room?: A Little Help needed climbing 3-5 steps with a railing? : A Little 6 Click Score: 15    End of Session Equipment Utilized During Treatment: Gait belt Activity Tolerance: Patient tolerated treatment well Patient left: with call bell/phone within reach;in chair Nurse Communication: Mobility status PT Visit Diagnosis: Unsteadiness on  feet (R26.81);Muscle weakness (generalized) (M62.81)     Time: 4650-3546 PT Time Calculation (min) (ACUTE ONLY): 20 min  Charges:  $Gait Training: 8-22 mins                     Earney Navy, PTA Acute Rehabilitation Services Pager: [phone] Office: [phone]     Darliss Cheney 05/03/2018, 11:54 AM

## 2018-05-03 NOTE — Progress Notes (Signed)
[facility] PROGRESS NOTE  Nicole Downs DGU:[id number] DOB: 1923/06/29 DOA: 05/01/2018  PCP: Pleas Koch, NP  Brief History/Interval Summary: 84 year old Caucasian female with a past medical history of dementia, hypertension presented with abdominal pain.  CT scan raised concern for small bowel diverticulitis.  UA was also noted to be abnormal.  She was hospitalized for further management.  Reason for Visit: Acute diverticulitis.  Urinary tract infection  Consultants: None  Procedures: None  Antibiotics: Ceftriaxone and Flagyl  Subjective/Interval History: Patient continues to feel well.  Denies any abdominal pain nausea or vomiting.  No urinary discomfort.    ROS: Denies any headaches  Objective:  Vital Signs  Vitals:   05/02/18 0941 05/02/18 1343 05/02/18 2218 05/03/18 0451  BP:  (!) 132/56 (!) 130/50 (!) 124/49  Pulse: 72 70 74 73  Resp:  18 18 16   Temp:  98.1 F (36.7 C) 98.2 F (36.8 C) 99 F (37.2 C)  TempSrc:  Oral Oral Oral  SpO2:  96% 98% 96%    Intake/Output Summary (Last 24 hours) at 05/03/2018 0912 Last data filed at 05/03/2018 9563 Gross per 24 hour  Intake 667.47 ml  Output 100 ml  Net 567.47 ml   There were no vitals filed for this visit.  General appearance: She is awake alert.  In no distress Resp: Normal effort at rest.  Clear to auscultation bilaterally. Cardio: S1-S2 is normal regular.  No S3-S4.  No rubs murmurs or bruit. GI: Abdomen is soft.  Mildly tender in the left lower side without any rebound rigidity or guarding.  No masses organomegaly.  Bowel sounds are present normal.  Neurologic: She is awake alert.  No cranial nerve deficits.  Motor strength is equal bilateral upper and lower extremities.  Lab Results:  Data Reviewed: I have personally reviewed following labs and imaging studies  CBC: Recent Labs  Lab 05/01/18 2134 05/01/18 2142 05/02/18 0440 05/03/18 0123  WBC 13.6*  --  11.9* 10.2  HGB 13.4  14.3 11.1* 10.2*  HCT 41.1 42.0 34.0* 32.2*  MCV 96.9  --  96.3 98.5  PLT 242  --  215 875    Basic Metabolic Panel: Recent Labs  Lab 05/01/18 2134 05/01/18 2142 05/02/18 0440 05/03/18 0123  NA 137 138 136 138  K 4.4 4.3 3.8 3.8  CL 103 102 102 105  CO2 24  --  24 23  GLUCOSE 116* 117* 102* 102*  BUN 21 22 20  24*  CREATININE 1.25* 1.30* 1.35* 1.60*  CALCIUM 9.8  --  8.8* 8.7*    GFR: CrCl cannot be calculated (Unknown ideal weight.).  Liver Function Tests: Recent Labs  Lab 05/01/18 2134  AST 24  ALT 16  ALKPHOS 56  BILITOT 1.1  PROT 7.6  ALBUMIN 3.7    Recent Labs  Lab 05/01/18 2136  LIPASE 34     Radiology Studies: Ct Abdomen Pelvis W Contrast  Result Date: 05/01/2018 CLINICAL DATA:  Epigastric pain and nausea today. History of emphysema and tremors. EXAM: CT ABDOMEN AND PELVIS WITH CONTRAST TECHNIQUE: Multidetector CT imaging of the abdomen and pelvis was performed using the standard protocol following bolus administration of intravenous contrast. CONTRAST:  55mL OMNIPAQUE IOHEXOL 300 MG/ML  SOLN COMPARISON:  None. FINDINGS: Mild motion degraded examination. LOWER CHEST: RIGHT lung base subpleural atelectasis/scarring. HEPATOBILIARY: Normal liver. Mild fluid distended gallbladder without inflammatory changes. PANCREAS: Nonacute, Atrophic. SPLEEN: Normal. ADRENALS/URINARY TRACT: Kidneys are orthotopic, demonstrating symmetric enhancement. Mild symmetric cortical thinning. No nephrolithiasis, hydronephrosis or  solid renal masses. 2.9 cm homogeneously hypodense benign-appearing RIGHT parapelvic cyst. The unopacified ureters are normal in course and caliber. Delayed imaging through the kidneys demonstrates symmetric prompt contrast excretion within the proximal urinary collecting system. Urinary bladder is partially distended and unremarkable. Normal adrenal glands. STOMACH/BOWEL: Moderate hiatal hernia. Mild small bowel diverticulosis with short segment of mid  inflammatory changes about a loop of small bowel (coronal image 34). The large bowel are normal in course and caliber without inflammatory changes, sensitivity decreased without oral contrast. Severe descending and sigmoid colonic diverticulosis. Normal appendix. VASCULAR/LYMPHATIC: Aortoiliac vessels are normal in course and caliber. Moderate calcific atherosclerosis. No lymphadenopathy by CT size criteria. REPRODUCTIVE: Normal. OTHER: No intraperitoneal free fluid or free air. MUSCULOSKELETAL: Nonacute. Osteopenia. Moderate old L2 compression fracture. Severe lumbar spondylosis. IMPRESSION: 1. Mild motion degraded examination. 2. Short segment acute mild small-bowel diverticulitis. Severe colonic diverticulosis. Aortic Atherosclerosis (ICD10-I70.0). Electronically Signed   By: Elon Alas M.D.   On: 05/01/2018 23:53     Medications:  Scheduled: . donepezil  5 mg Oral QHS  . enoxaparin (LOVENOX) injection  30 mg Subcutaneous Daily  . fentaNYL (SUBLIMAZE) injection  50 mcg Intravenous Once  . levobunolol  1 drop Both Eyes BID  . levothyroxine  25 mcg Oral QAC breakfast  . propranolol  60 mg Oral BID  . sertraline  25 mg Oral QHS   Continuous: . sodium chloride 50 mL/hr at 05/03/18 0807  . cefTRIAXone (ROCEPHIN)  IV 2 g (05/02/18 2209)  . lactated ringers    . metronidazole 500 mg (05/03/18 0237)   ZHG:[id number] **OR** acetaminophen, morphine injection, ondansetron **OR** ondansetron (ZOFRAN) IV    Assessment/Plan:  Acute diverticulitis, uncomplicated Patient was found to have small bowel diverticulitis.  She was placed on ceftriaxone and metronidazole.  Symptoms have improved.  She denies any nausea vomiting.  Consider changing to oral antibiotics.    Abnormal UA, possible acute cystitis Patient denies any urinary complaints currently.  However she was found to have mild suprapubic tenderness.  UA was noted to be abnormal.  Previous history of ESBL E. coli in 2017.  Urine  culture is growing 80,000 colonies of gram-negative bacteria.  CT of the abdomen pelvis did not show any abnormalities in the GU tract except for benign-appearing right parapelvic cyst.  Due to previous history of ESBL we will wait on final cultures before changing her ceftriaxone to oral antibiotics.   Acute on chronic kidney disease stage III Creatinine noted to be mildly elevated today compared to yesterday.  BUN is also slightly higher.  Could be due to poor oral intake.  We will gently hydrate her.  Monitor urine output.  Recheck labs tomorrow.    Essential hypertension Blood pressure is reasonably well controlled.  Holding losartan as she had borderline low blood pressures at admission plus due to the fact that her creatinine is higher today.  Essential tremor Continue propranolol.  History of hypothyroidism Continue levothyroxine.  History of dementia Stable.  Continue home medications.  Seen by PT and OT.  They are recommending skilled nursing facility.  Patient tells me that her daughter is with her around-the-clock.  She does not want to consider going to a skilled nursing facility.  Will discuss with family members.  Normocytic anemia No evidence of overt bleeding.  Monitor periodically.   DVT Prophylaxis: Lovenox    Code Status: Full code Family Communication: Discussed with the patient.  No family at bedside Disposition Plan: Management as outlined above.  Await  on urine cultures.  Wait on repeat labs tomorrow.    LOS: 0 days   Bonnielee Haff  [facility] Pager [phone] 05/03/2018, 9:12 AM  If 7PM-7AM, please contact night-coverage at [email or web address], password [facility]

## 2018-05-03 NOTE — Plan of Care (Signed)
  Problem: Clinical Measurements: Goal: Respiratory complications will improve Outcome: Progressing   Problem: Nutrition: Goal: Adequate nutrition will be maintained Outcome: Progressing   Problem: Elimination: Goal: Will not experience complications related to bowel motility Outcome: Progressing   

## 2018-05-04 LAB — URINE CULTURE: Culture: 80000 — AB

## 2018-05-04 LAB — BASIC METABOLIC PANEL
Anion gap: 11 (ref 5–15)
BUN: 19 mg/dL (ref 8–23)
CHLORIDE: 105 mmol/L (ref 98–111)
CO2: 21 mmol/L — ABNORMAL LOW (ref 22–32)
Calcium: 8.2 mg/dL — ABNORMAL LOW (ref 8.9–10.3)
Creatinine, Ser: 1.51 mg/dL — ABNORMAL HIGH (ref 0.44–1.00)
GFR calc Af Amer: 33 mL/min — ABNORMAL LOW (ref >60)
GFR calc non Af Amer: 29 mL/min — ABNORMAL LOW (ref >60)
GLUCOSE: 84 mg/dL (ref 70–99)
POTASSIUM: 3.5 mmol/L (ref 3.5–5.1)
Sodium: 137 mmol/L (ref 135–145)

## 2018-05-04 MED ORDER — AMOXICILLIN-POT CLAVULANATE 875-125 MG PO TABS: 1.0000 | ORAL_TABLET | Freq: Two times a day (BID) | ORAL | 0 refills | Status: AC

## 2018-05-04 MED ORDER — FOSFOMYCIN TROMETHAMINE 3 G PO PACK
3.0000 g | PACK | Freq: Once | ORAL | Status: AC
  Administered 2018-05-04: 3 g via ORAL
  Filled 2018-05-04: qty 3

## 2018-05-04 MED ORDER — AMOXICILLIN-POT CLAVULANATE 875-125 MG PO TABS
1.0000 | ORAL_TABLET | Freq: Two times a day (BID) | ORAL | Status: DC
  Administered 2018-05-04: 1 via ORAL
  Filled 2018-05-04: qty 1

## 2018-05-04 MED ORDER — POTASSIUM CHLORIDE CRYS ER 20 MEQ PO TBCR
40.0000 meq | EXTENDED_RELEASE_TABLET | Freq: Once | ORAL | Status: AC
  Administered 2018-05-04: 40 meq via ORAL
  Filled 2018-05-04: qty 2

## 2018-05-04 NOTE — Discharge Summary (Signed)
[facility]  Physician Discharge Summary   Patient ID: Nicole Downs MRN: [id number] DOB/AGE: [birth year] 84 y.o.  Admit date: 05/01/2018 Discharge date: 05/04/2018  PCP: Pleas Koch, NP  DISCHARGE DIAGNOSES:  Acute diverticulitis involving small bowel, improved Acute on chronic kidney disease stage III, improved Essential hypertension Essential tremor Hypothyroidism Dementia Normocytic anemia  RECOMMENDATIONS FOR OUTPATIENT FOLLOW UP: 1. Losartan has been discontinued due to elevated creatinine 2. Home health has been ordered   DISCHARGE CONDITION: fair  Diet recommendation: Low-sodium  Filed Weights   05/03/18 2202  Weight: 58.5 kg    INITIAL HISTORY: 84 year old Caucasian female with a past medical history of dementia, hypertension presented with abdominal pain.  CT scan raised concern for small bowel diverticulitis.  UA was also noted to be abnormal.  She was hospitalized for further management.   HOSPITAL COURSE:   Acute diverticulitis, uncomplicated Patient was found to have small bowel diverticulitis.  She was placed on ceftriaxone and metronidazole.  Symptoms improved.  She was tolerating her diet.  No nausea vomiting.  She was changed over to Augmentin to complete course of treatment.  Abnormal UA, possible acute cystitis Patient denies any urinary complaints currently.  However she was found to have mild suprapubic tenderness.  UA was noted to be abnormal.  Previous history of ESBL E. coli in 2017.  Urine culture grew 80,000 colonies of E. coli which was ESBL.  Patient without any significant symptoms suggestive of UTI.  She will be given a dose of fosfomycin.  Considering previous isolation of the same bacteria is quite possible she may be colonized with the same. CT of the abdomen pelvis did not show any abnormalities in the GU tract except for benign-appearing right parapelvic cyst.   Acute on chronic kidney disease stage III Patient  has underlying chronic kidney disease.  Creatinine was slightly high yesterday.  She was gently hydrated with improvement in creatinine today.  She has been having good urine output.  She was asked to stay well-hydrated at home.    Essential hypertension Blood pressure is reasonably well controlled.    Continue to hold losartan due to elevated creatinine.  Blood pressure is reasonably well controlled.  She is on propranolol as well.  Essential tremor Continue propranolol.  History of hypothyroidism Continue levothyroxine.  History of dementia Stable.  Continue home medications.  Seen by PT and OT.  They are recommending skilled nursing facility.  Patient tells me that her daughter is with her around-the-clock.  She does not want to consider going to a skilled nursing facility.  Discussed with her daughter wants to take her home.  Home health has been ordered.  Normocytic anemia No evidence of overt bleeding.    Overall stable.  Okay for discharge home today.  Discussed with patient's daughter.     PERTINENT LABS:  The results of significant diagnostics from this hospitalization (including imaging, microbiology, ancillary and laboratory) are listed below for reference.    Microbiology: Recent Results (from the past 240 hour(s))  Culture, Urine     Status: Abnormal   Collection Time: 05/02/18  1:28 PM  Result Value Ref Range Status   Specimen Description URINE, CLEAN CATCH  Final   Special Requests   Final    NONE Performed at [facility], [address], [place], [place] [address]    Culture (A)  Final    80,000 COLONIES/mL ESCHERICHIA COLI Confirmed Extended Spectrum Beta-Lactamase Producer (ESBL).  In bloodstream infections from ESBL organisms,  carbapenems are preferred over piperacillin/tazobactam. They are shown to have a lower risk of mortality.    Report Status 05/04/2018 FINAL  Final   Organism ID, Bacteria ESCHERICHIA COLI (A)  Final      Susceptibility     Escherichia coli - MIC*    AMPICILLIN >=32 RESISTANT Resistant     CEFAZOLIN >=64 RESISTANT Resistant     CEFTRIAXONE >=64 RESISTANT Resistant     CIPROFLOXACIN >=4 RESISTANT Resistant     GENTAMICIN <=1 SENSITIVE Sensitive     IMIPENEM <=0.25 SENSITIVE Sensitive     NITROFURANTOIN <=16 SENSITIVE Sensitive     TRIMETH/SULFA <=20 SENSITIVE Sensitive     AMPICILLIN/SULBACTAM >=32 RESISTANT Resistant     PIP/TAZO <=4 SENSITIVE Sensitive     Extended ESBL POSITIVE Resistant     * 80,000 COLONIES/mL ESCHERICHIA COLI     Labs: Basic Metabolic Panel: Recent Labs  Lab 05/01/18 2134 05/01/18 2142 05/02/18 0440 05/03/18 0123 05/04/18 0459  NA 137 138 136 138 137  K 4.4 4.3 3.8 3.8 3.5  CL 103 102 102 105 105  CO2 24  --  24 23 21*  GLUCOSE 116* 117* 102* 102* 84  BUN 21 22 20  24* 19  CREATININE 1.25* 1.30* 1.35* 1.60* 1.51*  CALCIUM 9.8  --  8.8* 8.7* 8.2*   Liver Function Tests: Recent Labs  Lab 05/01/18 2134  AST 24  ALT 16  ALKPHOS 56  BILITOT 1.1  PROT 7.6  ALBUMIN 3.7   Recent Labs  Lab 05/01/18 2136  LIPASE 34   CBC: Recent Labs  Lab 05/01/18 2134 05/01/18 2142 05/02/18 0440 05/03/18 0123  WBC 13.6*  --  11.9* 10.2  HGB 13.4 14.3 11.1* 10.2*  HCT 41.1 42.0 34.0* 32.2*  MCV 96.9  --  96.3 98.5  PLT 242  --  215 207    IMAGING STUDIES Ct Abdomen Pelvis W Contrast  Result Date: 05/01/2018 CLINICAL DATA:  Epigastric pain and nausea today. History of emphysema and tremors. EXAM: CT ABDOMEN AND PELVIS WITH CONTRAST TECHNIQUE: Multidetector CT imaging of the abdomen and pelvis was performed using the standard protocol following bolus administration of intravenous contrast. CONTRAST:  47mL OMNIPAQUE IOHEXOL 300 MG/ML  SOLN COMPARISON:  None. FINDINGS: Mild motion degraded examination. LOWER CHEST: RIGHT lung base subpleural atelectasis/scarring. HEPATOBILIARY: Normal liver. Mild fluid distended gallbladder without inflammatory changes. PANCREAS: Nonacute,  Atrophic. SPLEEN: Normal. ADRENALS/URINARY TRACT: Kidneys are orthotopic, demonstrating symmetric enhancement. Mild symmetric cortical thinning. No nephrolithiasis, hydronephrosis or solid renal masses. 2.9 cm homogeneously hypodense benign-appearing RIGHT parapelvic cyst. The unopacified ureters are normal in course and caliber. Delayed imaging through the kidneys demonstrates symmetric prompt contrast excretion within the proximal urinary collecting system. Urinary bladder is partially distended and unremarkable. Normal adrenal glands. STOMACH/BOWEL: Moderate hiatal hernia. Mild small bowel diverticulosis with short segment of mid inflammatory changes about a loop of small bowel (coronal image 34). The large bowel are normal in course and caliber without inflammatory changes, sensitivity decreased without oral contrast. Severe descending and sigmoid colonic diverticulosis. Normal appendix. VASCULAR/LYMPHATIC: Aortoiliac vessels are normal in course and caliber. Moderate calcific atherosclerosis. No lymphadenopathy by CT size criteria. REPRODUCTIVE: Normal. OTHER: No intraperitoneal free fluid or free air. MUSCULOSKELETAL: Nonacute. Osteopenia. Moderate old L2 compression fracture. Severe lumbar spondylosis. IMPRESSION: 1. Mild motion degraded examination. 2. Short segment acute mild small-bowel diverticulitis. Severe colonic diverticulosis. Aortic Atherosclerosis (ICD10-I70.0). Electronically Signed   By: Elon Alas M.D.   On: 05/01/2018 23:53    DISCHARGE EXAMINATION:  Vitals:   05/03/18 2158 05/03/18 2201 05/03/18 2202 05/04/18 0846  BP:  116/62  137/74  Pulse: (!) 59 (!) 58  (!) 58  Resp: 18   18  Temp: 98.7 F (37.1 C)   98.3 F (36.8 C)  TempSrc: Oral   Oral  SpO2: 97%   99%  Weight:   58.5 kg   Height:   5' (1.524 m)    General appearance: alert, cooperative, appears stated age and no distress Resp: clear to auscultation bilaterally Cardio: regular rate and rhythm, S1, S2 normal, no  murmur, click, rub or gallop GI: soft, non-tender; bowel sounds normal; no masses,  no organomegaly  DISPOSITION: Home with daughter with home health  Discharge Instructions    Call MD for:  difficulty breathing, headache or visual disturbances   Complete by:  As directed    Call MD for:  extreme fatigue   Complete by:  As directed    Call MD for:  persistant dizziness or light-headedness   Complete by:  As directed    Call MD for:  persistant nausea and vomiting   Complete by:  As directed    Call MD for:  severe uncontrolled pain   Complete by:  As directed    Call MD for:  temperature >100.4   Complete by:  As directed    Diet - low sodium heart healthy   Complete by:  As directed    Discharge instructions   Complete by:  As directed    Please be sure to follow up with your PCP within 1 week.  You were cared for by a hospitalist during your hospital stay. If you have any questions about your discharge medications or the care you received while you were in the hospital after you are discharged, you can call the unit and asked to speak with the hospitalist on call if the hospitalist that took care of you is not available. Once you are discharged, your primary care physician will handle any further medical issues. Please note that NO REFILLS for any discharge medications will be authorized once you are discharged, as it is imperative that you return to your primary care physician (or establish a relationship with a primary care physician if you do not have one) for your aftercare needs so that they can reassess your need for medications and monitor your lab values. If you do not have a primary care physician, you can call [phone] for a physician referral.   Increase activity slowly   Complete by:  As directed         Allergies as of 05/04/2018      Reactions   Amlodipine Other (See Comments)   Ankle edema      Medication List    STOP taking these medications   losartan 25 MG  tablet Commonly known as:  COZAAR     TAKE these medications   amoxicillin-clavulanate 875-125 MG tablet Commonly known as:  AUGMENTIN Take 1 tablet by mouth every 12 (twelve) hours for 7 days.   CALCIUM 600 PO Take 600 mg by mouth 2 (two) times daily.   donepezil 5 MG tablet Commonly known as:  ARICEPT TAKE 1 TABLET (5 MG TOTAL) BY MOUTH AT BEDTIME. What changed:  See the new instructions.   IRON PO Take 65 mg by mouth daily with lunch.   levobunolol 0.5 % ophthalmic solution Commonly known as:  BETAGAN Place 1 drop into both eyes 2 (two) times daily.  levothyroxine 25 MCG tablet Commonly known as:  SYNTHROID, LEVOTHROID TAKE 1 TABLET (25 MCG TOTAL) BY MOUTH DAILY BEFORE BREAKFAST. What changed:  See the new instructions.   MELATIN PO Take 1 tablet by mouth at bedtime as needed (sleep).   propranolol 60 MG tablet Commonly known as:  INDERAL TAKE 1 TABLET BY MOUTH TWICE DAILY FOR TREMORS. What changed:  See the new instructions.   sertraline 25 MG tablet Commonly known as:  ZOLOFT Take 1 tablet by mouth at bedtime for depression.   Vitamin D3 250 MCG (10000 UT) Tabs Take 1,000 mg by mouth 2 (two) times daily.        Follow-up Information    Pleas Koch, NP. Schedule an appointment as soon as possible for a visit in 1 week(s).   Specialty:  Internal Medicine Contact information: [place] [address] [phone]        [facility] up.   Specialty:  [place] Why:  for home health Contact information: [place] [address] [phone]           TOTAL DISCHARGE TIME: 26 minutes  Bonnielee Haff  [facility] Pager [phone]  05/04/2018, 12:05 PM

## 2018-05-04 NOTE — Discharge Instructions (Signed)

## 2018-05-06 ENCOUNTER — Telehealth: Payer: Self-pay

## 2018-05-06 NOTE — Telephone Encounter (Signed)
Daughter was concerned she has not heard from [facility] regarding PT services. Per home health agency representative, scheduler will contact daughter to discuss PT services.

## 2018-05-06 NOTE — Telephone Encounter (Signed)
Transitional Care Management Follow-up Telephone Call    Date discharged? 05/05/18  How have you been since you were released from the hospital? Pooler. Reports feeling of dizziness and general malaise. Has spent most of day in bed resting.   Any patient concerns? Concerned about urine culture results. Per nurse at Dr. Keturah Barre office, patient did not have a UTI.    Items Reviewed:  Medications reviewed: Yes  Allergies reviewed: Yes  Dietary changes reviewed: Yes  Referrals reviewed: Yes   Functional Questionnaire:  Independent - I Dependent - D    Activities of Daily Living (ADLs):    Personal hygiene - I Dressing - I Eating - I Maintaining continence - I Transferring - I   Independent Activities of Daily Living (iADLs): Basic communication skills - I Transportation - I Meal preparation  - I Shopping - I Housework - I Managing medications - I  Managing personal finances - I   Confirmed importance and date/time of follow-up visits scheduled YES  Provider Appointment booked with PCP 05/10/18 @ 1130  Confirmed with patient if condition begins to worsen call PCP or go to the ER.  Patient was given the office number and encouraged to call back with question or concerns: YES

## 2018-05-07 ENCOUNTER — Telehealth: Payer: Self-pay | Admitting: Primary Care

## 2018-05-07 DIAGNOSIS — N183 Chronic kidney disease, stage 3 (moderate): Secondary | ICD-10-CM | POA: Diagnosis not present

## 2018-05-07 DIAGNOSIS — N390 Urinary tract infection, site not specified: Secondary | ICD-10-CM | POA: Diagnosis not present

## 2018-05-07 DIAGNOSIS — E559 Vitamin D deficiency, unspecified: Secondary | ICD-10-CM | POA: Diagnosis not present

## 2018-05-07 DIAGNOSIS — Z9181 History of falling: Secondary | ICD-10-CM | POA: Diagnosis not present

## 2018-05-07 DIAGNOSIS — D631 Anemia in chronic kidney disease: Secondary | ICD-10-CM | POA: Diagnosis not present

## 2018-05-07 DIAGNOSIS — K5730 Diverticulosis of large intestine without perforation or abscess without bleeding: Secondary | ICD-10-CM | POA: Diagnosis not present

## 2018-05-07 DIAGNOSIS — F0280 Dementia in other diseases classified elsewhere without behavioral disturbance: Secondary | ICD-10-CM | POA: Diagnosis not present

## 2018-05-07 DIAGNOSIS — G250 Essential tremor: Secondary | ICD-10-CM | POA: Diagnosis not present

## 2018-05-07 DIAGNOSIS — I129 Hypertensive chronic kidney disease with stage 1 through stage 4 chronic kidney disease, or unspecified chronic kidney disease: Secondary | ICD-10-CM | POA: Diagnosis not present

## 2018-05-07 DIAGNOSIS — I700 Atherosclerosis of aorta: Secondary | ICD-10-CM | POA: Diagnosis not present

## 2018-05-07 DIAGNOSIS — M1990 Unspecified osteoarthritis, unspecified site: Secondary | ICD-10-CM | POA: Diagnosis not present

## 2018-05-07 DIAGNOSIS — H409 Unspecified glaucoma: Secondary | ICD-10-CM | POA: Diagnosis not present

## 2018-05-07 DIAGNOSIS — J439 Emphysema, unspecified: Secondary | ICD-10-CM | POA: Diagnosis not present

## 2018-05-07 DIAGNOSIS — K5712 Diverticulitis of small intestine without perforation or abscess without bleeding: Secondary | ICD-10-CM | POA: Diagnosis not present

## 2018-05-07 DIAGNOSIS — F331 Major depressive disorder, recurrent, moderate: Secondary | ICD-10-CM | POA: Diagnosis not present

## 2018-05-07 DIAGNOSIS — E039 Hypothyroidism, unspecified: Secondary | ICD-10-CM | POA: Diagnosis not present

## 2018-05-07 NOTE — Telephone Encounter (Signed)
Approved.  

## 2018-05-07 NOTE — Telephone Encounter (Signed)
Christin/[facility] called office requesting verbal order for PT for twice a week for 8 weeks.

## 2018-05-07 NOTE — Telephone Encounter (Signed)
Lesia/[facility] stated they will be able to go out to the patient's home for home health services tomorrow 05/08/18. They needed the okay to do so. Best cb [phone].

## 2018-05-07 NOTE — Telephone Encounter (Signed)
Gave the approval to [place].

## 2018-05-08 DIAGNOSIS — E039 Hypothyroidism, unspecified: Secondary | ICD-10-CM | POA: Diagnosis not present

## 2018-05-08 DIAGNOSIS — M1990 Unspecified osteoarthritis, unspecified site: Secondary | ICD-10-CM | POA: Diagnosis not present

## 2018-05-08 DIAGNOSIS — F0280 Dementia in other diseases classified elsewhere without behavioral disturbance: Secondary | ICD-10-CM | POA: Diagnosis not present

## 2018-05-08 DIAGNOSIS — N390 Urinary tract infection, site not specified: Secondary | ICD-10-CM | POA: Diagnosis not present

## 2018-05-08 DIAGNOSIS — J439 Emphysema, unspecified: Secondary | ICD-10-CM | POA: Diagnosis not present

## 2018-05-08 DIAGNOSIS — K5712 Diverticulitis of small intestine without perforation or abscess without bleeding: Secondary | ICD-10-CM | POA: Diagnosis not present

## 2018-05-08 NOTE — Telephone Encounter (Signed)
Gave the approval of the verbal order.

## 2018-05-09 DIAGNOSIS — M1990 Unspecified osteoarthritis, unspecified site: Secondary | ICD-10-CM | POA: Diagnosis not present

## 2018-05-09 DIAGNOSIS — J439 Emphysema, unspecified: Secondary | ICD-10-CM | POA: Diagnosis not present

## 2018-05-09 DIAGNOSIS — E039 Hypothyroidism, unspecified: Secondary | ICD-10-CM | POA: Diagnosis not present

## 2018-05-09 DIAGNOSIS — K5712 Diverticulitis of small intestine without perforation or abscess without bleeding: Secondary | ICD-10-CM | POA: Diagnosis not present

## 2018-05-09 DIAGNOSIS — N390 Urinary tract infection, site not specified: Secondary | ICD-10-CM | POA: Diagnosis not present

## 2018-05-09 DIAGNOSIS — F0280 Dementia in other diseases classified elsewhere without behavioral disturbance: Secondary | ICD-10-CM | POA: Diagnosis not present

## 2018-05-10 ENCOUNTER — Ambulatory Visit (INDEPENDENT_AMBULATORY_CARE_PROVIDER_SITE_OTHER): Payer: Medicare Other | Admitting: Primary Care

## 2018-05-10 VITALS — BP 140/78 | HR 56 | Temp 98.0°F | Ht 60.0 in | Wt 128.5 lb

## 2018-05-10 DIAGNOSIS — K5792 Diverticulitis of intestine, part unspecified, without perforation or abscess without bleeding: Secondary | ICD-10-CM | POA: Diagnosis not present

## 2018-05-10 DIAGNOSIS — I10 Essential (primary) hypertension: Secondary | ICD-10-CM

## 2018-05-10 DIAGNOSIS — N183 Chronic kidney disease, stage 3 unspecified: Secondary | ICD-10-CM

## 2018-05-10 DIAGNOSIS — Z09 Encounter for follow-up examination after completed treatment for conditions other than malignant neoplasm: Secondary | ICD-10-CM

## 2018-05-10 DIAGNOSIS — G250 Essential tremor: Secondary | ICD-10-CM | POA: Diagnosis not present

## 2018-05-10 LAB — CBC
HCT: 34.3 % — ABNORMAL LOW (ref 36.0–46.0)
Hemoglobin: 11.7 g/dL — ABNORMAL LOW (ref 12.0–15.0)
MCHC: 34.1 g/dL (ref 30.0–36.0)
MCV: 95.4 fl (ref 78.0–100.0)
PLATELETS: 325 10*3/uL (ref 150.0–400.0)
RBC: 3.59 Mil/uL — ABNORMAL LOW (ref 3.87–5.11)
RDW: 13.3 % (ref 11.5–15.5)
WBC: 7.8 10*3/uL (ref 4.0–10.5)

## 2018-05-10 LAB — BASIC METABOLIC PANEL
BUN: 15 mg/dL (ref 6–23)
CALCIUM: 9.8 mg/dL (ref 8.4–10.5)
CO2: 28 mEq/L (ref 19–32)
Chloride: 105 mEq/L (ref 96–112)
Creatinine, Ser: 1.08 mg/dL (ref 0.40–1.20)
GFR: 50.35 mL/min — AB (ref >60.00)
GLUCOSE: 94 mg/dL (ref 70–99)
Potassium: 4 mEq/L (ref 3.5–5.1)
SODIUM: 140 meq/L (ref 135–145)

## 2018-05-10 NOTE — Assessment & Plan Note (Signed)
Diagnosed and treated during recent hospitalization. Today she is doing well and denies diarrhea, constipation, bloody stools, abdominal pain.  Repeat BMP and CBC pending.  Exam today unremarkable.  Added Augmentin to allergy list given widespread rash.  Continue to advance diet slowly as tolerated.  Continue home PT/OT.  Prescription provided for new shower chair.  All hospital notes, labs, imaging reviewed.

## 2018-05-10 NOTE — Assessment & Plan Note (Signed)
Stable in the office today.  Losartan discontinued due to low blood pressure readings and also increased creatinine.  Repeat BMP pending today.  We will have daughter monitor blood pressure readings and report readings consistently at or above 150/90.

## 2018-05-10 NOTE — Progress Notes (Signed)
Subjective:    Patient ID: Nicole Downs, female    DOB: [birth year], 84 y.o.   MRN: [id number]  HPI  [name] is a 84 year old female who presents today for [facility] Follow up.  She presented to [facility] on 05/01/18 with a chief complaint of epigastric and LLQ abdominal pain. Prior to arrival she was tender to the LLQ, denied tenderness during her exam. Further work up including labs and CT revealed acute diverticulitis with leukocytosis and UTI. She was admitted for further evaluation and treatment.   During her hospital stay she received IV ceftriaxone and metronidazole and IV fluids. Her losartan was held given borderline low BP readings. Her BUN and creatinine was noted to elevate which was attributed to poor oral intake, she was provided with additional IV fluids and her losartan was discontinued. She was discharged home on 05/04/18 with oral Augmentin and recommendation for PCP follow up.  Since her discharge home she's been feeling better. Her daughter endorses a wide spread rash to her anterior and posterior trunk after initiation of oral Augmentin so she stopped giving this to her mother. The rash has since improved, she's also taking Benadryl. Of note she was provided with IV ceftriaxone during her hospital stay and had no reaction.   Her daughter is monitoring her blood pressure at home which is running 130-160/70-80's. Her daughter continues to notice grunting form her mother for which she attributes to propranolol as it began when this medication was initiated.  She has been on several medications for tremors none of which have seemed to help.  She has undergone neurology evaluation as well.  BP Readings from Last 3 Encounters:  05/10/18 140/78  05/04/18 137/74  04/10/18 130/78   She is visited by home health PT/OT, OT is scheduled to come out again today.  Her daughter accidentally broke her shower chair leg when moving it and is needing a new shower chair.   Review of  Systems  Constitutional: Negative for appetite change.  Gastrointestinal: Negative for abdominal pain, blood in stool, constipation, diarrhea, rectal pain and vomiting.  Skin: Negative for color change and rash.  Psychiatric/Behavioral: Negative for confusion.       Past Medical History:  Diagnosis Date  . Acute confusion   . Arthritis   . Emphysema lung ([place])   . Glaucoma   . Hypertension   . Hyponatremia   . Hypothyroidism   . Memory loss   . Poor peripheral circulation   . Tremors of nervous system      Social History   Socioeconomic History  . Marital status: Single    Spouse name: Not on file  . Number of children: 1  . Years of education: 11th grade  . Highest education level: Not on file  Occupational History  . Occupation: Retired  Scientific laboratory technician  . Financial resource strain: Not on file  . Food insecurity:    Worry: Not on file    Inability: Not on file  . Transportation needs:    Medical: Not on file    Non-medical: Not on file  Tobacco Use  . Smoking status: Never Smoker  . Smokeless tobacco: Never Used  Substance and Sexual Activity  . Alcohol use: No    Alcohol/week: 0.0 standard drinks  . Drug use: No  . Sexual activity: Not Currently  Lifestyle  . Physical activity:    Days per week: Not on file    Minutes per session: Not on file  .  Stress: Not on file  Relationships  . Social connections:    Talks on phone: Not on file    Gets together: Not on file    Attends religious service: Not on file    Active member of club or organization: Not on file    Attends meetings of clubs or organizations: Not on file    Relationship status: Not on file  . Intimate partner violence:    Fear of current or ex partner: Not on file    Emotionally abused: Not on file    Physically abused: Not on file    Forced sexual activity: Not on file  Other Topics Concern  . Not on file  Social History Narrative   Lives with daughter and son-in-law.   Right-handed.     Drinks one soda per day.    Past Surgical History:  Procedure Laterality Date  . BREAST BIOPSY      Family History  Problem Relation Age of Onset  . Heart attack Mother   . Other Father        unsure - she was only 41 years old when he died    Allergies  Allergen Reactions  . Augmentin [Amoxicillin-Pot Clavulanate] Rash  . Amlodipine Other (See Comments)    Ankle edema    Current Outpatient Medications on File Prior to Visit  Medication Sig Dispense Refill  . amoxicillin-clavulanate (AUGMENTIN) 875-125 MG tablet Take 1 tablet by mouth every 12 (twelve) hours for 7 days. 14 tablet 0  . Calcium Carbonate (CALCIUM 600 PO) Take 600 mg by mouth 2 (two) times daily.    . Cholecalciferol (VITAMIN D3) 10000 units TABS Take 1,000 mg by mouth 2 (two) times daily.    Marland Kitchen donepezil (ARICEPT) 5 MG tablet TAKE 1 TABLET (5 MG TOTAL) BY MOUTH AT BEDTIME. 90 tablet 1  . IRON PO Take 65 mg by mouth daily with lunch.    . levobunolol (BETAGAN) 0.5 % ophthalmic solution Place 1 drop into both eyes 2 (two) times daily.    Marland Kitchen levothyroxine (SYNTHROID, LEVOTHROID) 25 MCG tablet TAKE 1 TABLET (25 MCG TOTAL) BY MOUTH DAILY BEFORE BREAKFAST. (Patient taking differently: Take 25 mcg by mouth daily before breakfast. ) 90 tablet 1  . Melatonin-Pyridoxine (MELATIN PO) Take 1 tablet by mouth at bedtime as needed (sleep).    . sertraline (ZOLOFT) 25 MG tablet Take 1 tablet by mouth at bedtime for depression. 90 tablet 0   No current facility-administered medications on file prior to visit.     BP 140/78   Pulse (!) 56   Temp 98 F (36.7 C) (Oral)   Ht 5' (1.524 m)   Wt 128 lb 8 oz (58.3 kg)   SpO2 96%   BMI 25.10 kg/m    Objective:   Physical Exam  Constitutional: She is oriented to person, place, and time. She appears well-nourished.  Neck: Neck supple.  Cardiovascular: Normal rate and regular rhythm.  Respiratory: Effort normal and breath sounds normal.  GI: Soft. Bowel sounds are normal.  There is no tenderness.  Neurological: She is alert and oriented to person, place, and time.  Skin: Skin is warm and dry. No rash noted.  Psychiatric: She has a normal mood and affect.           Assessment & Plan:

## 2018-05-10 NOTE — Patient Instructions (Signed)
Stop by the lab prior to leaving today. I will notify you of your results once received.   Stop taking propranolol. Please update me via My Chart in two weeks.  Monitor your blood pressure 1-2 times weekly, please notify me if you see readings consistently at or above 150/90.  It was a pleasure to see you today!

## 2018-05-10 NOTE — Assessment & Plan Note (Signed)
Will stop propanolol to see if her grunting subsides.  Consider reinitiating gabapentin versus primidone if needed.  Her daughter will update in 2 weeks.

## 2018-05-11 ENCOUNTER — Other Ambulatory Visit: Payer: Self-pay | Admitting: Primary Care

## 2018-05-14 DIAGNOSIS — E039 Hypothyroidism, unspecified: Secondary | ICD-10-CM | POA: Diagnosis not present

## 2018-05-14 DIAGNOSIS — N390 Urinary tract infection, site not specified: Secondary | ICD-10-CM | POA: Diagnosis not present

## 2018-05-14 DIAGNOSIS — F0280 Dementia in other diseases classified elsewhere without behavioral disturbance: Secondary | ICD-10-CM | POA: Diagnosis not present

## 2018-05-14 DIAGNOSIS — K5712 Diverticulitis of small intestine without perforation or abscess without bleeding: Secondary | ICD-10-CM | POA: Diagnosis not present

## 2018-05-14 DIAGNOSIS — M1990 Unspecified osteoarthritis, unspecified site: Secondary | ICD-10-CM | POA: Diagnosis not present

## 2018-05-14 DIAGNOSIS — J439 Emphysema, unspecified: Secondary | ICD-10-CM | POA: Diagnosis not present

## 2018-05-17 DIAGNOSIS — F0280 Dementia in other diseases classified elsewhere without behavioral disturbance: Secondary | ICD-10-CM | POA: Diagnosis not present

## 2018-05-17 DIAGNOSIS — M1990 Unspecified osteoarthritis, unspecified site: Secondary | ICD-10-CM | POA: Diagnosis not present

## 2018-05-17 DIAGNOSIS — E039 Hypothyroidism, unspecified: Secondary | ICD-10-CM | POA: Diagnosis not present

## 2018-05-17 DIAGNOSIS — J439 Emphysema, unspecified: Secondary | ICD-10-CM | POA: Diagnosis not present

## 2018-05-17 DIAGNOSIS — K5712 Diverticulitis of small intestine without perforation or abscess without bleeding: Secondary | ICD-10-CM | POA: Diagnosis not present

## 2018-05-17 DIAGNOSIS — N390 Urinary tract infection, site not specified: Secondary | ICD-10-CM | POA: Diagnosis not present

## 2018-05-20 DIAGNOSIS — J439 Emphysema, unspecified: Secondary | ICD-10-CM | POA: Diagnosis not present

## 2018-05-20 DIAGNOSIS — E039 Hypothyroidism, unspecified: Secondary | ICD-10-CM | POA: Diagnosis not present

## 2018-05-20 DIAGNOSIS — M1990 Unspecified osteoarthritis, unspecified site: Secondary | ICD-10-CM | POA: Diagnosis not present

## 2018-05-20 DIAGNOSIS — K5712 Diverticulitis of small intestine without perforation or abscess without bleeding: Secondary | ICD-10-CM | POA: Diagnosis not present

## 2018-05-20 DIAGNOSIS — N390 Urinary tract infection, site not specified: Secondary | ICD-10-CM | POA: Diagnosis not present

## 2018-05-20 DIAGNOSIS — F0280 Dementia in other diseases classified elsewhere without behavioral disturbance: Secondary | ICD-10-CM | POA: Diagnosis not present

## 2018-05-21 DIAGNOSIS — G250 Essential tremor: Secondary | ICD-10-CM

## 2018-05-22 DIAGNOSIS — J439 Emphysema, unspecified: Secondary | ICD-10-CM | POA: Diagnosis not present

## 2018-05-22 DIAGNOSIS — M1990 Unspecified osteoarthritis, unspecified site: Secondary | ICD-10-CM | POA: Diagnosis not present

## 2018-05-22 DIAGNOSIS — K5712 Diverticulitis of small intestine without perforation or abscess without bleeding: Secondary | ICD-10-CM | POA: Diagnosis not present

## 2018-05-22 DIAGNOSIS — F0280 Dementia in other diseases classified elsewhere without behavioral disturbance: Secondary | ICD-10-CM | POA: Diagnosis not present

## 2018-05-22 DIAGNOSIS — N390 Urinary tract infection, site not specified: Secondary | ICD-10-CM | POA: Diagnosis not present

## 2018-05-22 DIAGNOSIS — E039 Hypothyroidism, unspecified: Secondary | ICD-10-CM | POA: Diagnosis not present

## 2018-05-22 MED ORDER — PROPRANOLOL HCL 80 MG PO TABS: 80.0000 mg | ORAL_TABLET | Freq: Two times a day (BID) | ORAL | 0 refills | Status: DC

## 2018-05-27 DIAGNOSIS — F0280 Dementia in other diseases classified elsewhere without behavioral disturbance: Secondary | ICD-10-CM | POA: Diagnosis not present

## 2018-05-27 DIAGNOSIS — K5712 Diverticulitis of small intestine without perforation or abscess without bleeding: Secondary | ICD-10-CM | POA: Diagnosis not present

## 2018-05-27 DIAGNOSIS — J439 Emphysema, unspecified: Secondary | ICD-10-CM | POA: Diagnosis not present

## 2018-05-27 DIAGNOSIS — E039 Hypothyroidism, unspecified: Secondary | ICD-10-CM | POA: Diagnosis not present

## 2018-05-27 DIAGNOSIS — N390 Urinary tract infection, site not specified: Secondary | ICD-10-CM | POA: Diagnosis not present

## 2018-05-27 DIAGNOSIS — M1990 Unspecified osteoarthritis, unspecified site: Secondary | ICD-10-CM | POA: Diagnosis not present

## 2018-05-27 NOTE — Telephone Encounter (Signed)
Nicole Downs.  Can you get a kit ready?

## 2018-05-28 ENCOUNTER — Other Ambulatory Visit (INDEPENDENT_AMBULATORY_CARE_PROVIDER_SITE_OTHER): Payer: Medicare Other

## 2018-05-28 ENCOUNTER — Other Ambulatory Visit: Payer: Self-pay | Admitting: Primary Care

## 2018-05-28 DIAGNOSIS — M1990 Unspecified osteoarthritis, unspecified site: Secondary | ICD-10-CM | POA: Diagnosis not present

## 2018-05-28 DIAGNOSIS — E039 Hypothyroidism, unspecified: Secondary | ICD-10-CM | POA: Diagnosis not present

## 2018-05-28 DIAGNOSIS — N3000 Acute cystitis without hematuria: Secondary | ICD-10-CM

## 2018-05-28 DIAGNOSIS — J439 Emphysema, unspecified: Secondary | ICD-10-CM | POA: Diagnosis not present

## 2018-05-28 DIAGNOSIS — N390 Urinary tract infection, site not specified: Secondary | ICD-10-CM | POA: Diagnosis not present

## 2018-05-28 DIAGNOSIS — F0280 Dementia in other diseases classified elsewhere without behavioral disturbance: Secondary | ICD-10-CM | POA: Diagnosis not present

## 2018-05-28 DIAGNOSIS — R8290 Unspecified abnormal findings in urine: Secondary | ICD-10-CM | POA: Diagnosis not present

## 2018-05-28 DIAGNOSIS — K5712 Diverticulitis of small intestine without perforation or abscess without bleeding: Secondary | ICD-10-CM | POA: Diagnosis not present

## 2018-05-28 LAB — POC URINALSYSI DIPSTICK (AUTOMATED)
Bilirubin, UA: NEGATIVE
Blood, UA: NEGATIVE
GLUCOSE UA: NEGATIVE
Ketones, UA: NEGATIVE
Nitrite, UA: NEGATIVE
Protein, UA: NEGATIVE
SPEC GRAV UA: 1.02 (ref 1.010–1.025)
UROBILINOGEN UA: 0.2 U/dL
pH, UA: 6 (ref 5.0–8.0)

## 2018-05-28 MED ORDER — SULFAMETHOXAZOLE-TRIMETHOPRIM 800-160 MG PO TABS: 1.0000 | ORAL_TABLET | Freq: Two times a day (BID) | ORAL | 0 refills | Status: DC

## 2018-05-29 DIAGNOSIS — M1990 Unspecified osteoarthritis, unspecified site: Secondary | ICD-10-CM | POA: Diagnosis not present

## 2018-05-29 DIAGNOSIS — K5712 Diverticulitis of small intestine without perforation or abscess without bleeding: Secondary | ICD-10-CM | POA: Diagnosis not present

## 2018-05-29 DIAGNOSIS — E039 Hypothyroidism, unspecified: Secondary | ICD-10-CM | POA: Diagnosis not present

## 2018-05-29 DIAGNOSIS — J439 Emphysema, unspecified: Secondary | ICD-10-CM | POA: Diagnosis not present

## 2018-05-29 DIAGNOSIS — F0280 Dementia in other diseases classified elsewhere without behavioral disturbance: Secondary | ICD-10-CM | POA: Diagnosis not present

## 2018-05-29 DIAGNOSIS — N390 Urinary tract infection, site not specified: Secondary | ICD-10-CM | POA: Diagnosis not present

## 2018-05-30 ENCOUNTER — Other Ambulatory Visit: Payer: Self-pay | Admitting: Primary Care

## 2018-05-30 DIAGNOSIS — E039 Hypothyroidism, unspecified: Secondary | ICD-10-CM | POA: Diagnosis not present

## 2018-05-30 DIAGNOSIS — J439 Emphysema, unspecified: Secondary | ICD-10-CM | POA: Diagnosis not present

## 2018-05-30 DIAGNOSIS — M1990 Unspecified osteoarthritis, unspecified site: Secondary | ICD-10-CM | POA: Diagnosis not present

## 2018-05-30 DIAGNOSIS — N3000 Acute cystitis without hematuria: Secondary | ICD-10-CM

## 2018-05-30 DIAGNOSIS — K5712 Diverticulitis of small intestine without perforation or abscess without bleeding: Secondary | ICD-10-CM | POA: Diagnosis not present

## 2018-05-30 DIAGNOSIS — N390 Urinary tract infection, site not specified: Secondary | ICD-10-CM | POA: Diagnosis not present

## 2018-05-30 DIAGNOSIS — F0280 Dementia in other diseases classified elsewhere without behavioral disturbance: Secondary | ICD-10-CM | POA: Diagnosis not present

## 2018-05-30 LAB — URINE CULTURE
MICRO NUMBER: 91477589
SPECIMEN QUALITY: ADEQUATE

## 2018-05-30 MED ORDER — NITROFURANTOIN MONOHYD MACRO 100 MG PO CAPS: 100.0000 mg | ORAL_CAPSULE | Freq: Two times a day (BID) | ORAL | 0 refills | Status: AC

## 2018-06-03 DIAGNOSIS — M1990 Unspecified osteoarthritis, unspecified site: Secondary | ICD-10-CM | POA: Diagnosis not present

## 2018-06-03 DIAGNOSIS — F0280 Dementia in other diseases classified elsewhere without behavioral disturbance: Secondary | ICD-10-CM | POA: Diagnosis not present

## 2018-06-03 DIAGNOSIS — J439 Emphysema, unspecified: Secondary | ICD-10-CM | POA: Diagnosis not present

## 2018-06-03 DIAGNOSIS — N390 Urinary tract infection, site not specified: Secondary | ICD-10-CM | POA: Diagnosis not present

## 2018-06-03 DIAGNOSIS — E039 Hypothyroidism, unspecified: Secondary | ICD-10-CM | POA: Diagnosis not present

## 2018-06-03 DIAGNOSIS — K5712 Diverticulitis of small intestine without perforation or abscess without bleeding: Secondary | ICD-10-CM | POA: Diagnosis not present

## 2018-06-05 DIAGNOSIS — M1990 Unspecified osteoarthritis, unspecified site: Secondary | ICD-10-CM | POA: Diagnosis not present

## 2018-06-05 DIAGNOSIS — J439 Emphysema, unspecified: Secondary | ICD-10-CM | POA: Diagnosis not present

## 2018-06-05 DIAGNOSIS — N390 Urinary tract infection, site not specified: Secondary | ICD-10-CM | POA: Diagnosis not present

## 2018-06-05 DIAGNOSIS — E039 Hypothyroidism, unspecified: Secondary | ICD-10-CM | POA: Diagnosis not present

## 2018-06-05 DIAGNOSIS — F0280 Dementia in other diseases classified elsewhere without behavioral disturbance: Secondary | ICD-10-CM | POA: Diagnosis not present

## 2018-06-05 DIAGNOSIS — K5712 Diverticulitis of small intestine without perforation or abscess without bleeding: Secondary | ICD-10-CM | POA: Diagnosis not present

## 2018-06-07 ENCOUNTER — Encounter: Payer: Self-pay | Admitting: Primary Care

## 2018-06-07 ENCOUNTER — Ambulatory Visit (INDEPENDENT_AMBULATORY_CARE_PROVIDER_SITE_OTHER): Payer: Medicare Other | Admitting: Primary Care

## 2018-06-07 VITALS — BP 130/70 | HR 63 | Temp 97.5°F | Ht 60.0 in | Wt 123.0 lb

## 2018-06-07 DIAGNOSIS — I10 Essential (primary) hypertension: Secondary | ICD-10-CM | POA: Diagnosis not present

## 2018-06-07 DIAGNOSIS — G250 Essential tremor: Secondary | ICD-10-CM

## 2018-06-07 DIAGNOSIS — R300 Dysuria: Secondary | ICD-10-CM

## 2018-06-07 DIAGNOSIS — N390 Urinary tract infection, site not specified: Secondary | ICD-10-CM | POA: Insufficient documentation

## 2018-06-07 LAB — POCT URINALYSIS DIPSTICK
Bilirubin, UA: NEGATIVE
Glucose, UA: NEGATIVE
KETONES UA: NEGATIVE
LEUKOCYTES UA: NEGATIVE
NITRITE UA: NEGATIVE
PROTEIN UA: POSITIVE — AB
RBC UA: NEGATIVE
Spec Grav, UA: 1.02 (ref 1.010–1.025)
UROBILINOGEN UA: 0.2 U/dL
pH, UA: 6 (ref 5.0–8.0)

## 2018-06-07 MED ORDER — PROPRANOLOL HCL 80 MG PO TABS: 160.0000 mg | ORAL_TABLET | Freq: Two times a day (BID) | ORAL | 0 refills | Status: DC

## 2018-06-07 MED ORDER — PROPRANOLOL HCL 80 MG PO TABS: ORAL_TABLET | ORAL | 0 refills | Status: DC

## 2018-06-07 NOTE — Assessment & Plan Note (Signed)
Stable in the office today. Continue propranolol, continue off of losartan.

## 2018-06-07 NOTE — Patient Instructions (Signed)
Your urine sample looks good, I will send off a urine culture to ensure.  We've increased your propranolol to 160 mg for your tremors. Take 2 capsules daily. Please notify me if you feel dizzy or notice drops in heart rate.  It was a pleasure to see you today!

## 2018-06-07 NOTE — Assessment & Plan Note (Signed)
Improved when on propranolol than without. Increase dose to 160 mg in the AM, 80 mg in the PM.  Daughter will monitor BP and HR and update if she sees any low readings. She will also update regarding tremors.

## 2018-06-07 NOTE — Progress Notes (Signed)
Subjective:    Patient ID: Nicole Downs, female    DOB: 1923/06/20, 84 y.o.   MRN: [id number]  HPI  [name] is a 84 year old female who presents today for follow up of UTI and tremors.  Her daughter sent a message via my chart on 05/27/18 reporting that the patient seemed confused, also not getting dressed in the morning which was unusual. Given these symptoms and her recent UTI during her last hospital stay we recommended we test for UTI. Her daughter picked up a collection kit and returned it later. UA with 3+ leuks, negative nitrites, negative blood so she was initiated on Bactrim DS tablets. Her urine culture returned and showed E coli with resistance to numerous antibiotics including Bactrim DS. She was contacted and her antibiotic was changed to [place].  Since treatment she has been compliant to her Macrobid, took her last tablet last night. Her daughter endorses that she's not drinking much water. She's also wadding toilet paper in her underwear for which she'll wear during the day. Her daughter has also purchased special wipes for which she is not using. Home health PT and OT are giving her tips regarding proper toileting.  She stopped her propranolol for a two week period of time as her daughter didn't feel as though it was helping. Her daughter then noticed worsening tremors and patient was having a hard time using utensils so we resumed her propranolol and increased her dose to 80 mg twice daily.   Since resuming her propranolol she's had some improvement in tremors, continues to shake throughout the day. Her daughter has specifically noticed that it's easier for her to feed herself when on the propranolol than without. PT/OT have been monitoring her heart rate and blood pressure and deny low readings.  BP Readings from Last 3 Encounters:  06/07/18 130/70  05/10/18 140/78  05/04/18 137/74     Review of Systems  Constitutional: Negative for fever.  Respiratory:  Negative for shortness of breath.   Cardiovascular: Negative for chest pain.  Neurological: Positive for tremors.  Psychiatric/Behavioral: Negative for confusion.       Past Medical History:  Diagnosis Date  . Acute confusion   . Arthritis   . Emphysema lung ([place])   . Glaucoma   . Hypertension   . Hyponatremia   . Hypothyroidism   . Memory loss   . Poor peripheral circulation   . Tremors of nervous system      Social History   Socioeconomic History  . Marital status: Single    Spouse name: Not on file  . Number of children: 1  . Years of education: 11th grade  . Highest education level: Not on file  Occupational History  . Occupation: Retired  Scientific laboratory technician  . Financial resource strain: Not on file  . Food insecurity:    Worry: Not on file    Inability: Not on file  . Transportation needs:    Medical: Not on file    Non-medical: Not on file  Tobacco Use  . Smoking status: Never Smoker  . Smokeless tobacco: Never Used  Substance and Sexual Activity  . Alcohol use: No    Alcohol/week: 0.0 standard drinks  . Drug use: No  . Sexual activity: Not Currently  Lifestyle  . Physical activity:    Days per week: Not on file    Minutes per session: Not on file  . Stress: Not on file  Relationships  . Social connections:  Talks on phone: Not on file    Gets together: Not on file    Attends religious service: Not on file    Active member of club or organization: Not on file    Attends meetings of clubs or organizations: Not on file    Relationship status: Not on file  . Intimate partner violence:    Fear of current or ex partner: Not on file    Emotionally abused: Not on file    Physically abused: Not on file    Forced sexual activity: Not on file  Other Topics Concern  . Not on file  Social History Narrative   Lives with daughter and son-in-law.   Right-handed.   Drinks one soda per day.    Past Surgical History:  Procedure Laterality Date  . BREAST  BIOPSY      Family History  Problem Relation Age of Onset  . Heart attack Mother   . Other Father        unsure - she was only 62 years old when he died    Allergies  Allergen Reactions  . Augmentin [Amoxicillin-Pot Clavulanate] Rash  . Amlodipine Other (See Comments)    Ankle edema    Current Outpatient Medications on File Prior to Visit  Medication Sig Dispense Refill  . Calcium Carbonate (CALCIUM 600 PO) Take 600 mg by mouth 2 (two) times daily.    . Cholecalciferol (VITAMIN D3) 10000 units TABS Take 1,000 mg by mouth 2 (two) times daily.    Marland Kitchen donepezil (ARICEPT) 5 MG tablet TAKE 1 TABLET (5 MG TOTAL) BY MOUTH AT BEDTIME. 90 tablet 1  . IRON PO Take 65 mg by mouth daily with lunch.    . levobunolol (BETAGAN) 0.5 % ophthalmic solution Place 1 drop into both eyes 2 (two) times daily.    Marland Kitchen levothyroxine (SYNTHROID, LEVOTHROID) 25 MCG tablet TAKE 1 TABLET BY MOUTH DAILY. TAKE WITHOUT FOOD, WATER ONLY, DO NOT EAT WITHIN 30 MINUTES AFTER TAKING 90 tablet 1  . Melatonin-Pyridoxine (MELATIN PO) Take 1 tablet by mouth at bedtime as needed (sleep).    . sertraline (ZOLOFT) 25 MG tablet Take 1 tablet by mouth at bedtime for depression. 90 tablet 0   No current facility-administered medications on file prior to visit.     BP 130/70 (BP Location: Right Arm, Patient Position: Sitting, Cuff Size: Normal)   Pulse 63   Temp (!) 97.5 F (36.4 C) (Oral)   Ht 5' (1.524 m)   Wt 123 lb (55.8 kg)   SpO2 98%   BMI 24.02 kg/m    Objective:   Physical Exam  Constitutional: She is oriented to person, place, and time. She appears well-nourished.  Neck: Neck supple.  Cardiovascular: Normal rate and regular rhythm.  Respiratory: Effort normal and breath sounds normal.  Neurological: She is alert and oriented to person, place, and time.  Resting tremor to head and bilateral upper extremities   Skin: Skin is warm and dry.           Assessment & Plan:

## 2018-06-07 NOTE — Assessment & Plan Note (Signed)
Second UTI within 6 weeks. UA today shows resolve, culture sent. Last culture with resistance to numerous antibiotics. Discussed proper hygiene and the importance of hydration. She verbalized understanding. Consider Urology referral if UTI returns.

## 2018-06-08 LAB — URINE CULTURE
MICRO NUMBER:: 91526510
SPECIMEN QUALITY:: ADEQUATE

## 2018-06-09 DIAGNOSIS — N390 Urinary tract infection, site not specified: Secondary | ICD-10-CM | POA: Diagnosis not present

## 2018-06-09 DIAGNOSIS — F0280 Dementia in other diseases classified elsewhere without behavioral disturbance: Secondary | ICD-10-CM | POA: Diagnosis not present

## 2018-06-09 DIAGNOSIS — M1990 Unspecified osteoarthritis, unspecified site: Secondary | ICD-10-CM | POA: Diagnosis not present

## 2018-06-09 DIAGNOSIS — K5712 Diverticulitis of small intestine without perforation or abscess without bleeding: Secondary | ICD-10-CM | POA: Diagnosis not present

## 2018-06-09 DIAGNOSIS — J439 Emphysema, unspecified: Secondary | ICD-10-CM | POA: Diagnosis not present

## 2018-06-09 DIAGNOSIS — E039 Hypothyroidism, unspecified: Secondary | ICD-10-CM | POA: Diagnosis not present

## 2018-06-11 DIAGNOSIS — M1990 Unspecified osteoarthritis, unspecified site: Secondary | ICD-10-CM | POA: Diagnosis not present

## 2018-06-11 DIAGNOSIS — N390 Urinary tract infection, site not specified: Secondary | ICD-10-CM | POA: Diagnosis not present

## 2018-06-11 DIAGNOSIS — J439 Emphysema, unspecified: Secondary | ICD-10-CM | POA: Diagnosis not present

## 2018-06-11 DIAGNOSIS — F0280 Dementia in other diseases classified elsewhere without behavioral disturbance: Secondary | ICD-10-CM | POA: Diagnosis not present

## 2018-06-11 DIAGNOSIS — K5712 Diverticulitis of small intestine without perforation or abscess without bleeding: Secondary | ICD-10-CM | POA: Diagnosis not present

## 2018-06-11 DIAGNOSIS — E039 Hypothyroidism, unspecified: Secondary | ICD-10-CM | POA: Diagnosis not present

## 2018-06-17 DIAGNOSIS — E039 Hypothyroidism, unspecified: Secondary | ICD-10-CM | POA: Diagnosis not present

## 2018-06-17 DIAGNOSIS — M1990 Unspecified osteoarthritis, unspecified site: Secondary | ICD-10-CM | POA: Diagnosis not present

## 2018-06-17 DIAGNOSIS — K5712 Diverticulitis of small intestine without perforation or abscess without bleeding: Secondary | ICD-10-CM | POA: Diagnosis not present

## 2018-06-17 DIAGNOSIS — J439 Emphysema, unspecified: Secondary | ICD-10-CM | POA: Diagnosis not present

## 2018-06-17 DIAGNOSIS — F0280 Dementia in other diseases classified elsewhere without behavioral disturbance: Secondary | ICD-10-CM | POA: Diagnosis not present

## 2018-06-17 DIAGNOSIS — N390 Urinary tract infection, site not specified: Secondary | ICD-10-CM | POA: Diagnosis not present

## 2018-06-21 DIAGNOSIS — J439 Emphysema, unspecified: Secondary | ICD-10-CM | POA: Diagnosis not present

## 2018-06-21 DIAGNOSIS — E039 Hypothyroidism, unspecified: Secondary | ICD-10-CM | POA: Diagnosis not present

## 2018-06-21 DIAGNOSIS — N390 Urinary tract infection, site not specified: Secondary | ICD-10-CM | POA: Diagnosis not present

## 2018-06-21 DIAGNOSIS — K5712 Diverticulitis of small intestine without perforation or abscess without bleeding: Secondary | ICD-10-CM | POA: Diagnosis not present

## 2018-06-21 DIAGNOSIS — M1990 Unspecified osteoarthritis, unspecified site: Secondary | ICD-10-CM | POA: Diagnosis not present

## 2018-06-21 DIAGNOSIS — F0280 Dementia in other diseases classified elsewhere without behavioral disturbance: Secondary | ICD-10-CM | POA: Diagnosis not present

## 2018-06-24 DIAGNOSIS — N390 Urinary tract infection, site not specified: Secondary | ICD-10-CM | POA: Diagnosis not present

## 2018-06-24 DIAGNOSIS — J439 Emphysema, unspecified: Secondary | ICD-10-CM | POA: Diagnosis not present

## 2018-06-24 DIAGNOSIS — K5712 Diverticulitis of small intestine without perforation or abscess without bleeding: Secondary | ICD-10-CM | POA: Diagnosis not present

## 2018-06-24 DIAGNOSIS — E039 Hypothyroidism, unspecified: Secondary | ICD-10-CM | POA: Diagnosis not present

## 2018-06-24 DIAGNOSIS — F0280 Dementia in other diseases classified elsewhere without behavioral disturbance: Secondary | ICD-10-CM | POA: Diagnosis not present

## 2018-06-24 DIAGNOSIS — M1990 Unspecified osteoarthritis, unspecified site: Secondary | ICD-10-CM | POA: Diagnosis not present

## 2018-06-26 DIAGNOSIS — F0280 Dementia in other diseases classified elsewhere without behavioral disturbance: Secondary | ICD-10-CM | POA: Diagnosis not present

## 2018-06-26 DIAGNOSIS — E039 Hypothyroidism, unspecified: Secondary | ICD-10-CM | POA: Diagnosis not present

## 2018-06-26 DIAGNOSIS — N390 Urinary tract infection, site not specified: Secondary | ICD-10-CM | POA: Diagnosis not present

## 2018-06-26 DIAGNOSIS — J439 Emphysema, unspecified: Secondary | ICD-10-CM | POA: Diagnosis not present

## 2018-06-26 DIAGNOSIS — K5712 Diverticulitis of small intestine without perforation or abscess without bleeding: Secondary | ICD-10-CM | POA: Diagnosis not present

## 2018-06-26 DIAGNOSIS — M1990 Unspecified osteoarthritis, unspecified site: Secondary | ICD-10-CM | POA: Diagnosis not present

## 2018-07-01 DIAGNOSIS — J439 Emphysema, unspecified: Secondary | ICD-10-CM | POA: Diagnosis not present

## 2018-07-01 DIAGNOSIS — M1990 Unspecified osteoarthritis, unspecified site: Secondary | ICD-10-CM | POA: Diagnosis not present

## 2018-07-01 DIAGNOSIS — K5712 Diverticulitis of small intestine without perforation or abscess without bleeding: Secondary | ICD-10-CM | POA: Diagnosis not present

## 2018-07-01 DIAGNOSIS — N390 Urinary tract infection, site not specified: Secondary | ICD-10-CM | POA: Diagnosis not present

## 2018-07-01 DIAGNOSIS — E039 Hypothyroidism, unspecified: Secondary | ICD-10-CM | POA: Diagnosis not present

## 2018-07-01 DIAGNOSIS — F0280 Dementia in other diseases classified elsewhere without behavioral disturbance: Secondary | ICD-10-CM | POA: Diagnosis not present

## 2018-07-02 ENCOUNTER — Other Ambulatory Visit: Payer: Self-pay | Admitting: Primary Care

## 2018-07-02 DIAGNOSIS — F331 Major depressive disorder, recurrent, moderate: Secondary | ICD-10-CM

## 2018-07-02 DIAGNOSIS — M1990 Unspecified osteoarthritis, unspecified site: Secondary | ICD-10-CM | POA: Diagnosis not present

## 2018-07-02 DIAGNOSIS — E039 Hypothyroidism, unspecified: Secondary | ICD-10-CM | POA: Diagnosis not present

## 2018-07-02 DIAGNOSIS — J439 Emphysema, unspecified: Secondary | ICD-10-CM | POA: Diagnosis not present

## 2018-07-02 DIAGNOSIS — K5712 Diverticulitis of small intestine without perforation or abscess without bleeding: Secondary | ICD-10-CM | POA: Diagnosis not present

## 2018-07-02 DIAGNOSIS — F0280 Dementia in other diseases classified elsewhere without behavioral disturbance: Secondary | ICD-10-CM | POA: Diagnosis not present

## 2018-07-02 DIAGNOSIS — N390 Urinary tract infection, site not specified: Secondary | ICD-10-CM | POA: Diagnosis not present

## 2018-07-04 DIAGNOSIS — L820 Inflamed seborrheic keratosis: Secondary | ICD-10-CM | POA: Diagnosis not present

## 2018-07-04 DIAGNOSIS — L570 Actinic keratosis: Secondary | ICD-10-CM | POA: Diagnosis not present

## 2018-07-04 DIAGNOSIS — L821 Other seborrheic keratosis: Secondary | ICD-10-CM | POA: Diagnosis not present

## 2018-07-16 ENCOUNTER — Other Ambulatory Visit: Payer: Self-pay | Admitting: Primary Care

## 2018-07-16 DIAGNOSIS — I10 Essential (primary) hypertension: Secondary | ICD-10-CM

## 2018-07-16 DIAGNOSIS — G250 Essential tremor: Secondary | ICD-10-CM

## 2018-07-17 MED ORDER — PROPRANOLOL HCL 80 MG PO TABS: ORAL_TABLET | ORAL | 1 refills | Status: DC

## 2018-09-24 ENCOUNTER — Other Ambulatory Visit: Payer: Self-pay | Admitting: Primary Care

## 2018-09-24 DIAGNOSIS — F331 Major depressive disorder, recurrent, moderate: Secondary | ICD-10-CM

## 2018-10-10 ENCOUNTER — Ambulatory Visit: Payer: Medicare Other | Admitting: Primary Care

## 2018-10-21 ENCOUNTER — Other Ambulatory Visit: Payer: Self-pay | Admitting: Primary Care

## 2018-11-04 ENCOUNTER — Other Ambulatory Visit: Payer: Self-pay | Admitting: Primary Care

## 2018-11-27 DIAGNOSIS — C44319 Basal cell carcinoma of skin of other parts of face: Secondary | ICD-10-CM | POA: Diagnosis not present

## 2018-11-27 DIAGNOSIS — D485 Neoplasm of uncertain behavior of skin: Secondary | ICD-10-CM | POA: Diagnosis not present

## 2018-11-27 DIAGNOSIS — Z85828 Personal history of other malignant neoplasm of skin: Secondary | ICD-10-CM | POA: Diagnosis not present

## 2018-11-27 DIAGNOSIS — L820 Inflamed seborrheic keratosis: Secondary | ICD-10-CM | POA: Diagnosis not present

## 2018-11-27 DIAGNOSIS — L578 Other skin changes due to chronic exposure to nonionizing radiation: Secondary | ICD-10-CM | POA: Diagnosis not present

## 2018-11-27 DIAGNOSIS — L570 Actinic keratosis: Secondary | ICD-10-CM | POA: Diagnosis not present

## 2018-12-09 ENCOUNTER — Ambulatory Visit: Payer: Medicare Other | Admitting: Primary Care

## 2018-12-19 ENCOUNTER — Other Ambulatory Visit: Payer: Self-pay | Admitting: Primary Care

## 2018-12-19 DIAGNOSIS — F331 Major depressive disorder, recurrent, moderate: Secondary | ICD-10-CM

## 2019-01-07 ENCOUNTER — Other Ambulatory Visit: Payer: Self-pay | Admitting: Primary Care

## 2019-01-07 DIAGNOSIS — G250 Essential tremor: Secondary | ICD-10-CM

## 2019-01-29 ENCOUNTER — Other Ambulatory Visit: Payer: Self-pay | Admitting: Primary Care

## 2019-03-14 ENCOUNTER — Other Ambulatory Visit: Payer: Self-pay

## 2019-04-11 ENCOUNTER — Other Ambulatory Visit: Payer: Self-pay | Admitting: Primary Care

## 2019-04-15 ENCOUNTER — Ambulatory Visit (INDEPENDENT_AMBULATORY_CARE_PROVIDER_SITE_OTHER): Payer: Medicare Other

## 2019-04-15 DIAGNOSIS — Z0000 Encounter for general adult medical examination without abnormal findings: Secondary | ICD-10-CM

## 2019-04-15 NOTE — Patient Instructions (Signed)
Ms. Bui , Thank you for taking time to come for your Medicare Wellness Visit. I appreciate your ongoing commitment to your health goals. Please review the following plan we discussed and let me know if I can assist you in the future.   Screening recommendations/referrals: Colonoscopy: no longer required Mammogram: no longer required Bone Density: completed 03/19/2014 Recommended yearly ophthalmology/optometry visit for glaucoma screening and checkup Recommended yearly dental visit for hygiene and checkup  Vaccinations: Influenza vaccine: will get at next office visit Pneumococcal vaccine: Completed series Tdap vaccine: declined Shingles vaccine: declined    Advanced directives: Please bring a copy of your POA (Power of Attorney) and/or Living Will to your next appointment.   Conditions/risks identified: hypertension  Next appointment: 04/16/2019 @ 9:40 am    Preventive Care 65 Years and Older, Female Preventive care refers to lifestyle choices and visits with your health care provider that can promote health and wellness. What does preventive care include?  A yearly physical exam. This is also called an annual well check.  Dental exams once or twice a year.  Routine eye exams. Ask your health care provider how often you should have your eyes checked.  Personal lifestyle choices, including:  Daily care of your teeth and gums.  Regular physical activity.  Eating a healthy diet.  Avoiding tobacco and drug use.  Limiting alcohol use.  Practicing safe sex.  Taking low-dose aspirin every day.  Taking vitamin and mineral supplements as recommended by your health care provider. What happens during an annual well check? The services and screenings done by your health care provider during your annual well check will depend on your age, overall health, lifestyle risk factors, and family history of disease. Counseling  Your health care provider may ask you questions about  your:  Alcohol use.  Tobacco use.  Drug use.  Emotional well-being.  Home and relationship well-being.  Sexual activity.  Eating habits.  History of falls.  Memory and ability to understand (cognition).  Work and work Statistician.  Reproductive health. Screening  You may have the following tests or measurements:  Height, weight, and BMI.  Blood pressure.  Lipid and cholesterol levels. These may be checked every 5 years, or more frequently if you are over 45 years old.  Skin check.  Lung cancer screening. You may have this screening every year starting at age 19 if you have a 30-pack-year history of smoking and currently smoke or have quit within the past 15 years.  Fecal occult blood test (FOBT) of the stool. You may have this test every year starting at age 91.  Flexible sigmoidoscopy or colonoscopy. You may have a sigmoidoscopy every 5 years or a colonoscopy every 10 years starting at age 44.  Hepatitis C blood test.  Hepatitis B blood test.  Sexually transmitted disease (STD) testing.  Diabetes screening. This is done by checking your blood sugar (glucose) after you have not eaten for a while (fasting). You may have this done every 1-3 years.  Bone density scan. This is done to screen for osteoporosis. You may have this done starting at age 39.  Mammogram. This may be done every 1-2 years. Talk to your health care provider about how often you should have regular mammograms. Talk with your health care provider about your test results, treatment options, and if necessary, the need for more tests. Vaccines  Your health care provider may recommend certain vaccines, such as:  Influenza vaccine. This is recommended every year.  Tetanus, diphtheria,  and acellular pertussis (Tdap, Td) vaccine. You may need a Td booster every 10 years.  Zoster vaccine. You may need this after age 95.  Pneumococcal 13-valent conjugate (PCV13) vaccine. One dose is recommended  after age 56.  Pneumococcal polysaccharide (PPSV23) vaccine. One dose is recommended after age 57. Talk to your health care provider about which screenings and vaccines you need and how often you need them. This information is not intended to replace advice given to you by your health care provider. Make sure you discuss any questions you have with your health care provider. Document Released: 07/02/2015 Document Revised: 02/23/2016 Document Reviewed: 04/06/2015 Elsevier Interactive Patient Education  2017 [employer] Prevention in the Home Falls can cause injuries. They can happen to people of all ages. There are many things you can do to make your home safe and to help prevent falls. What can I do on the outside of my home?  Regularly fix the edges of walkways and driveways and fix any cracks.  Remove anything that might make you trip as you walk through a door, such as a raised step or threshold.  Trim any bushes or trees on the path to your home.  Use bright outdoor lighting.  Clear any walking paths of anything that might make someone trip, such as rocks or tools.  Regularly check to see if handrails are loose or broken. Make sure that both sides of any steps have handrails.  Any raised decks and porches should have guardrails on the edges.  Have any leaves, snow, or ice cleared regularly.  Use sand or salt on walking paths during winter.  Clean up any spills in your garage right away. This includes oil or grease spills. What can I do in the bathroom?  Use night lights.  Install grab bars by the toilet and in the tub and shower. Do not use towel bars as grab bars.  Use non-skid mats or decals in the tub or shower.  If you need to sit down in the shower, use a plastic, non-slip stool.  Keep the floor dry. Clean up any water that spills on the floor as soon as it happens.  Remove soap buildup in the tub or shower regularly.  Attach bath mats securely with  double-sided non-slip rug tape.  Do not have throw rugs and other things on the floor that can make you trip. What can I do in the bedroom?  Use night lights.  Make sure that you have a light by your bed that is easy to reach.  Do not use any sheets or blankets that are too big for your bed. They should not hang down onto the floor.  Have a firm chair that has side arms. You can use this for support while you get dressed.  Do not have throw rugs and other things on the floor that can make you trip. What can I do in the kitchen?  Clean up any spills right away.  Avoid walking on wet floors.  Keep items that you use a lot in easy-to-reach places.  If you need to reach something above you, use a strong step stool that has a grab bar.  Keep electrical cords out of the way.  Do not use floor polish or wax that makes floors slippery. If you must use wax, use non-skid floor wax.  Do not have throw rugs and other things on the floor that can make you trip. What can I do with my  stairs?  Do not leave any items on the stairs.  Make sure that there are handrails on both sides of the stairs and use them. Fix handrails that are broken or loose. Make sure that handrails are as long as the stairways.  Check any carpeting to make sure that it is firmly attached to the stairs. Fix any carpet that is loose or worn.  Avoid having throw rugs at the top or bottom of the stairs. If you do have throw rugs, attach them to the floor with carpet tape.  Make sure that you have a light switch at the top of the stairs and the bottom of the stairs. If you do not have them, ask someone to add them for you. What else can I do to help prevent falls?  Wear shoes that:  Do not have high heels.  Have rubber bottoms.  Are comfortable and fit you well.  Are closed at the toe. Do not wear sandals.  If you use a stepladder:  Make sure that it is fully opened. Do not climb a closed stepladder.  Make  sure that both sides of the stepladder are locked into place.  Ask someone to hold it for you, if possible.  Clearly mark and make sure that you can see:  Any grab bars or handrails.  First and last steps.  Where the edge of each step is.  Use tools that help you move around (mobility aids) if they are needed. These include:  Canes.  Walkers.  Scooters.  Crutches.  Turn on the lights when you go into a dark area. Replace any light bulbs as soon as they burn out.  Set up your furniture so you have a clear path. Avoid moving your furniture around.  If any of your floors are uneven, fix them.  If there are any pets around you, be aware of where they are.  Review your medicines with your doctor. Some medicines can make you feel dizzy. This can increase your chance of falling. Ask your doctor what other things that you can do to help prevent falls. This information is not intended to replace advice given to you by your health care provider. Make sure you discuss any questions you have with your health care provider. Document Released: 04/01/2009 Document Revised: 11/11/2015 Document Reviewed: 07/10/2014 Elsevier Interactive Patient Education  2017 [employer].

## 2019-04-15 NOTE — Progress Notes (Signed)
Subjective:   Tyiona Yeats is a 85 y.o. female who presents for Medicare Annual (Subsequent) preventive examination.  Review of Systems:    This visit is being conducted through telemedicine via telephone at the nurse health advisor's home address due to the COVID-19 pandemic. This patient has given me verbal consent via doximity to conduct this visit, patient states they are participating from their home address. Patient has given verbal consent for me to speak with her daughter, Talmadge Coventry because she is very hard of hearing. Patient, Talmadge Coventry, and myself are on the telephone call. There is no referral for this visit. Some vital signs may be absent or patient reported.    Patient identification: identified by name, DOB, and current address   Cardiac Risk Factors include: advanced age (>45men, >72 women);hypertension;sedentary lifestyle     Objective:     Vitals: There were no vitals taken for this visit.  There is no height or weight on file to calculate BMI.  Advanced Directives 04/15/2019 05/01/2018 04/10/2018  Does Patient Have a Medical Advance Directive? Yes Yes Yes  Type of Paramedic of [place];Living will Healthcare Power of [place] in Chart? No - copy requested No - copy requested No - copy requested    Tobacco Social History   Tobacco Use  Smoking Status Never Smoker  Smokeless Tobacco Never Used     Counseling given: Not Answered   Clinical Intake:  Pre-visit preparation completed: Yes  Pain : No/denies pain     Nutritional Risks: None Diabetes: No  How often do you need to have someone help you when you read instructions, pamphlets, or other written materials from your doctor or pharmacy?: 1 - Never What is the last grade level you completed in school?: 11th  Interpreter Needed?: No  Information entered by :: CJohnson, LPN  Past Medical  History:  Diagnosis Date  . Acute confusion   . Arthritis   . Emphysema lung ([place])   . Glaucoma   . Hypertension   . Hyponatremia   . Hypothyroidism   . Memory loss   . Poor peripheral circulation   . Tremors of nervous system    Past Surgical History:  Procedure Laterality Date  . BREAST BIOPSY     Family History  Problem Relation Age of Onset  . Heart attack Mother   . Other Father        unsure - she was only 63 years old when he died   Social History   Socioeconomic History  . Marital status: Single    Spouse name: Not on file  . Number of children: 1  . Years of education: 11th grade  . Highest education level: Not on file  Occupational History  . Occupation: Retired  Scientific laboratory technician  . Financial resource strain: Not hard at all  . Food insecurity    Worry: Never true    Inability: Never true  . Transportation needs    Medical: No    Non-medical: No  Tobacco Use  . Smoking status: Never Smoker  . Smokeless tobacco: Never Used  Substance and Sexual Activity  . Alcohol use: No    Alcohol/week: 0.0 standard drinks  . Drug use: No  . Sexual activity: Not Currently  Lifestyle  . Physical activity    Days per week: 0 days    Minutes per session: 0 min  . Stress: Not at all  Relationships  . Social Herbalist on phone: Not on file    Gets together: Not on file    Attends religious service: Not on file    Active member of club or organization: Not on file    Attends meetings of clubs or organizations: Not on file    Relationship status: Not on file  Other Topics Concern  . Not on file  Social History Narrative   Lives with daughter and son-in-law.   Right-handed.   Drinks one soda per day.    Outpatient Encounter Medications as of 04/15/2019  Medication Sig  . Calcium Carbonate (CALCIUM 600 PO) Take 600 mg by mouth 2 (two) times daily.  . Cholecalciferol (VITAMIN D3) 10000 units TABS Take 1,000 mg by mouth 2 (two) times daily.  Marland Kitchen donepezil  (ARICEPT) 5 MG tablet TAKE 1 TABLET BY MOUTH EVERYDAY AT BEDTIME  . IRON PO Take 65 mg by mouth daily with lunch.  . levobunolol (BETAGAN) 0.5 % ophthalmic solution Place 1 drop into both eyes 2 (two) times daily.  Marland Kitchen levothyroxine (SYNTHROID) 25 MCG tablet TAKE 1 TABLET BY MOUTH DAILY WITHOUT FOOD, WATER ONLY, DO NOT EAT WITHIN 30 MINUTES AFTER TAKING  . Melatonin-Pyridoxine (MELATIN PO) Take 1 tablet by mouth at bedtime as needed (sleep).  . propranolol (INDERAL) 80 MG tablet TAKE 2 TABLETS BY MOUTH TWICE A DAY FOR TREMOR  . sertraline (ZOLOFT) 25 MG tablet TAKE 1 TABLET BY MOUTH AT BEDTIME FOR DEPRESSION.   No facility-administered encounter medications on file as of 04/15/2019.     Activities of Daily Living In your present state of health, do you have any difficulty performing the following activities: 04/15/2019  Hearing? Y  Comment very hard of hearing, does not wear hearing aids  Vision? N  Difficulty concentrating or making decisions? Y  Comment short term memory loss  Walking or climbing stairs? N  Dressing or bathing? Y  Comment daughter helps with bathing  Doing errands, shopping? Y  Comment lives with daughter and she takes her to appointments  Preparing Food and eating ? N  Using the Toilet? N  In the past six months, have you accidently leaked urine? N  Do you have problems with loss of bowel control? N  Managing your Medications? N  Managing your Finances? Y  Comment lives with daughter and she handles this  Housekeeping or managing your Housekeeping? Y  Comment lives with daughter and she handles this  Some recent data might be hidden    Patient Care Team: Pleas Koch, NP as PCP - General (Internal Medicine)    Assessment:   This is a routine wellness examination for [place].  Exercise Activities and Dietary recommendations Current Exercise Habits: The patient does not participate in regular exercise at present, Exercise limited by: None identified   Goals    . Patient Stated     Starting 04/10/2018, I will continue to take medications as prescribed.     . Patient Stated     04/15/2019, Patient will maintain and continue medications as prescribed.        Fall Risk Fall Risk  04/15/2019 04/10/2018 10/17/2017  Falls in the past year? 0 No No  Number falls in past yr: 0 - -  Injury with Fall? 0 - -  Risk for fall due to : Medication side effect;Impaired balance/gait - -  Follow up Falls evaluation completed;Falls prevention discussed - -   Is the patient's home free of  loose throw rugs in walkways, pet beds, electrical cords, etc?   yes      Grab bars in the bathroom? no      Handrails on the stairs?   yes      Adequate lighting?   yes  Timed Get Up and Go performed: N/A  Depression Screen PHQ 2/9 Scores 04/15/2019 04/10/2018 04/10/2018  PHQ - 2 Score 0 4 0  PHQ- 9 Score 0 10 0     Cognitive Function MMSE - Mini Mental State Exam 04/15/2019 04/10/2018  Not completed: Unable to complete (No Data)  Mini Cog  Mini-Cog screen was not completed. Patient very hard of hearing and could not complete test at this time. Maximum score is 22. A value of 0 denotes this part of the MMSE was not completed or the patient failed this part of the Mini-Cog screening.       Immunization History  Administered Date(s) Administered  . Influenza,inj,Quad PF,6+ Mos 04/10/2016, 03/29/2017, 04/10/2018  . Pneumococcal Conjugate-13 03/19/2014  . Pneumococcal Polysaccharide-23 03/20/2015  . Tdap 06/19/2008    Qualifies for Shingles Vaccine? Yes  Screening Tests Health Maintenance  Topic Date Due  . INFLUENZA VACCINE  01/18/2019  . TETANUS/TDAP  04/14/2020 (Originally 06/19/2018)  . DEXA SCAN  Completed  . PNA vac Low Risk Adult  Completed    Cancer Screenings: Lung: Low Dose CT Chest recommended if Age 70-80 years, 30 pack-year currently smoking OR have quit w/in 15years. Patient does not qualify. Breast:  Up to date on Mammogram? Yes,  no longer required   Up to date of Bone Density/Dexa? Yes, completed 03/19/2014 Colorectal: no longer required  Additional Screenings:  Hepatitis C Screening: N/A     Plan:    Patient will maintain and continue medications as prescribed.    I have personally reviewed and noted the following in the patient's chart:   . Medical and social history . Use of alcohol, tobacco or illicit drugs  . Current medications and supplements . Functional ability and status . Nutritional status . Physical activity . Advanced directives . List of other physicians . Hospitalizations, surgeries, and ER visits in previous 12 months . Vitals . Screenings to include cognitive, depression, and falls . Referrals and appointments  In addition, I have reviewed and discussed with patient certain preventive protocols, quality metrics, and best practice recommendations. A written personalized care plan for preventive services as well as general preventive health recommendations were provided to patient.     Andrez Grime, LPN  [id number]

## 2019-04-15 NOTE — Progress Notes (Signed)
PCP notes:  Health Maintenance: needs flu vaccine Declined Tdap and Shingrix   Abnormal Screenings: MMSE not able to complete due to patient being very hard of hearing.     Patient concerns: none    Nurse concerns: none    Next PCP appt.: 04/16/2019 @ 9:40 am

## 2019-04-16 ENCOUNTER — Ambulatory Visit (INDEPENDENT_AMBULATORY_CARE_PROVIDER_SITE_OTHER): Payer: Medicare Other | Admitting: Primary Care

## 2019-04-16 ENCOUNTER — Encounter: Payer: Self-pay | Admitting: Primary Care

## 2019-04-16 ENCOUNTER — Ambulatory Visit: Payer: Medicare Other

## 2019-04-16 VITALS — BP 128/80 | HR 67 | Temp 97.8°F | Ht 60.0 in | Wt 127.0 lb

## 2019-04-16 DIAGNOSIS — R600 Localized edema: Secondary | ICD-10-CM

## 2019-04-16 DIAGNOSIS — L602 Onychogryphosis: Secondary | ICD-10-CM | POA: Diagnosis not present

## 2019-04-16 DIAGNOSIS — J439 Emphysema, unspecified: Secondary | ICD-10-CM | POA: Diagnosis not present

## 2019-04-16 DIAGNOSIS — E039 Hypothyroidism, unspecified: Secondary | ICD-10-CM | POA: Diagnosis not present

## 2019-04-16 DIAGNOSIS — F331 Major depressive disorder, recurrent, moderate: Secondary | ICD-10-CM

## 2019-04-16 DIAGNOSIS — F0390 Unspecified dementia without behavioral disturbance: Secondary | ICD-10-CM

## 2019-04-16 DIAGNOSIS — Z23 Encounter for immunization: Secondary | ICD-10-CM | POA: Diagnosis not present

## 2019-04-16 DIAGNOSIS — D649 Anemia, unspecified: Secondary | ICD-10-CM | POA: Diagnosis not present

## 2019-04-16 DIAGNOSIS — G250 Essential tremor: Secondary | ICD-10-CM | POA: Diagnosis not present

## 2019-04-16 DIAGNOSIS — I10 Essential (primary) hypertension: Secondary | ICD-10-CM | POA: Diagnosis not present

## 2019-04-16 DIAGNOSIS — H409 Unspecified glaucoma: Secondary | ICD-10-CM | POA: Diagnosis not present

## 2019-04-16 LAB — COMPREHENSIVE METABOLIC PANEL
ALT: 17 U/L (ref 0–35)
AST: 21 U/L (ref 0–37)
Albumin: 4.1 g/dL (ref 3.5–5.2)
Alkaline Phosphatase: 58 U/L (ref 39–117)
BUN: 21 mg/dL (ref 6–23)
CO2: 30 mEq/L (ref 19–32)
Calcium: 9.6 mg/dL (ref 8.4–10.5)
Chloride: 103 mEq/L (ref 96–112)
Creatinine, Ser: 1.22 mg/dL — ABNORMAL HIGH (ref 0.40–1.20)
GFR: 41.08 mL/min — ABNORMAL LOW (ref >60.00)
Glucose, Bld: 98 mg/dL (ref 70–99)
Potassium: 4.1 mEq/L (ref 3.5–5.1)
Sodium: 140 mEq/L (ref 135–145)
Total Bilirubin: 0.5 mg/dL (ref 0.2–1.2)
Total Protein: 7.3 g/dL (ref 6.0–8.3)

## 2019-04-16 LAB — CBC
HCT: 39.2 % (ref 36.0–46.0)
Hemoglobin: 13.2 g/dL (ref 12.0–15.0)
MCHC: 33.6 g/dL (ref 30.0–36.0)
MCV: 96.7 fl (ref 78.0–100.0)
Platelets: 250 10*3/uL (ref 150.0–400.0)
RBC: 4.05 Mil/uL (ref 3.87–5.11)
RDW: 13.5 % (ref 11.5–15.5)
WBC: 9.9 10*3/uL (ref 4.0–10.5)

## 2019-04-16 LAB — LIPID PANEL
Cholesterol: 204 mg/dL — ABNORMAL HIGH (ref 0–200)
HDL: 47.2 mg/dL (ref >39.00)
LDL Cholesterol: 122 mg/dL — ABNORMAL HIGH (ref 0–99)
NonHDL: 156.79
Total CHOL/HDL Ratio: 4
Triglycerides: 173 mg/dL — ABNORMAL HIGH (ref 0.0–149.0)
VLDL: 34.6 mg/dL (ref 0.0–40.0)

## 2019-04-16 MED ORDER — TETANUS-DIPHTH-ACELL PERTUSSIS 5-2-15.5 LF-MCG/0.5 IM SUSP: 0.5000 mL | Freq: Once | INTRAMUSCULAR | 0 refills | Status: AC

## 2019-04-16 NOTE — Assessment & Plan Note (Addendum)
Stable.  Continue donepezil.   Agree with plan. Allie Bossier, NP-C

## 2019-04-16 NOTE — Assessment & Plan Note (Addendum)
Adherent to Levothyroxine 25 mg daily. She is taking as directed for the most part. Continue same. TSH 09/2017 normal.   Repeat TSH pending.   Agree with plan. Allie Bossier, NP-C

## 2019-04-16 NOTE — Assessment & Plan Note (Addendum)
Stable. Needs refill for expired levobunolol, eye exam required for this.    She will schedule an appointment with opthamology.   Agree with plan, Allie Bossier, NP-C

## 2019-04-16 NOTE — Addendum Note (Signed)
Addended by: Jacqualin Combes on: 04/16/2019 12:21 PM   Modules accepted: Orders

## 2019-04-16 NOTE — Assessment & Plan Note (Addendum)
Stable, chronic without improvement on increased dose of propranolol. Both daughter and patient prefer not to pursue any further treatment at this time.  Continue propanolol.   Agree with plan. Allie Bossier, NP-C

## 2019-04-16 NOTE — Progress Notes (Signed)
Established Patient Office Visit  Subjective:  Patient ID: Nicole Downs, female    DOB: [birth year]  Age: 85 y.o. MRN: [id number]   Chief Complaint  Patient presents with  . Annual Exam    HPI Nicole Downs is a 85 year old female with a history of hypertension, hypothyroidism, dementia, depression, and essential tremor who presents today for a CPE.   Immunizations: Influenza vaccine: Completed today Pneumonia vaccine: UTD Shingles vaccine: Completed, date unknown Tetanus: Due 06/2018   Dental exam: Completed 2019 Eye exam: Completed 2019 Colonoscopy: No longer completed Dexa scan: No longer completed Mammogram: No longer completed  She lives with her daughter, ambulates with a walker.   Diet: She endorses a fair diet, mostly home cooked meals, her daughter cooks for her. She eats lean proteins, vegetables, starches, bread and desserts daily. She is drinking juice, water, coke, sweet tea.   Exercise: She is not exercising.     Past Medical History:  Diagnosis Date  . Acute confusion   . Arthritis   . Emphysema lung ([place])   . Glaucoma   . Hypertension   . Hyponatremia   . Hypothyroidism   . Memory loss   . Poor peripheral circulation   . Tremors of nervous system     Past Surgical History:  Procedure Laterality Date  . BREAST BIOPSY      Family History  Problem Relation Age of Onset  . Heart attack Mother   . Other Father        unsure - she was only 53 years old when he died    Social History   Socioeconomic History  . Marital status: Single    Spouse name: Not on file  . Number of children: 1  . Years of education: 11th grade  . Highest education level: Not on file  Occupational History  . Occupation: Retired  Scientific laboratory technician  . Financial resource strain: Not hard at all  . Food insecurity    Worry: Never true    Inability: Never true  . Transportation needs    Medical: No    Non-medical: No  Tobacco Use  . Smoking status: Never  Smoker  . Smokeless tobacco: Never Used  Substance and Sexual Activity  . Alcohol use: No    Alcohol/week: 0.0 standard drinks  . Drug use: No  . Sexual activity: Not Currently  Lifestyle  . Physical activity    Days per week: 0 days    Minutes per session: 0 min  . Stress: Not at all  Relationships  . Social Herbalist on phone: Not on file    Gets together: Not on file    Attends religious service: Not on file    Active member of club or organization: Not on file    Attends meetings of clubs or organizations: Not on file    Relationship status: Not on file  . Intimate partner violence    Fear of current or ex partner: No    Emotionally abused: No    Physically abused: No    Forced sexual activity: No  Other Topics Concern  . Not on file  Social History Narrative   Lives with daughter and son-in-law.   Right-handed.   Drinks one soda per day.    Outpatient Medications Prior to Visit  Medication Sig Dispense Refill  . Calcium Carbonate (CALCIUM 600 PO) Take 600 mg by mouth 2 (two) times daily.    . Cholecalciferol (VITAMIN D3)  10000 units TABS Take 1,000 mg by mouth 2 (two) times daily.    Marland Kitchen donepezil (ARICEPT) 5 MG tablet TAKE 1 TABLET BY MOUTH EVERYDAY AT BEDTIME 90 tablet 1  . IRON PO Take 65 mg by mouth daily with lunch.    . levobunolol (BETAGAN) 0.5 % ophthalmic solution Place 1 drop into both eyes 2 (two) times daily.    Marland Kitchen levothyroxine (SYNTHROID) 25 MCG tablet TAKE 1 TABLET BY MOUTH DAILY WITHOUT FOOD, WATER ONLY, DO NOT EAT WITHIN 30 MINUTES AFTER TAKING 90 tablet 0  . Melatonin-Pyridoxine (MELATIN PO) Take 1 tablet by mouth at bedtime as needed (sleep).    . propranolol (INDERAL) 80 MG tablet TAKE 2 TABLETS BY MOUTH TWICE A DAY FOR TREMOR 360 tablet 1  . sertraline (ZOLOFT) 25 MG tablet TAKE 1 TABLET BY MOUTH AT BEDTIME FOR DEPRESSION. 90 tablet 1   No facility-administered medications prior to visit.     Allergies  Allergen Reactions  .  Augmentin [Amoxicillin-Pot Clavulanate] Rash  . Amlodipine Other (See Comments)    Ankle edema    ROS Review of Systems  Constitutional: Negative for unexpected weight change.  Respiratory: Negative for shortness of breath.   Cardiovascular: Negative for chest pain.  Gastrointestinal: Negative for constipation, diarrhea, nausea and vomiting.  Genitourinary: Negative.   Musculoskeletal: Negative.   Neurological: Positive for tremors. Negative for dizziness and headaches.  Psychiatric/Behavioral: The patient is not nervous/anxious.       Objective:    Physical Exam  Constitutional: She is oriented to person, place, and time. She appears well-developed and well-nourished.  Cardiovascular: Normal rate, regular rhythm and normal heart sounds.  Pulmonary/Chest: Effort normal and breath sounds normal.  Abdominal: Soft. Bowel sounds are normal. There is no abdominal tenderness.  Musculoskeletal: Normal range of motion.  Neurological: She is alert and oriented to person, place, and time. She has normal strength.  Skin: Skin is warm and dry.  Thick, overgrown toenails. Dried blood to L great toe, with moderate erythema.     Psychiatric: She has a normal mood and affect.    Wt 127 lb (57.6 kg)   BMI 24.80 kg/m  Wt Readings from Last 3 Encounters:  04/16/19 127 lb (57.6 kg)  06/07/18 123 lb (55.8 kg)  05/10/18 128 lb 8 oz (58.3 kg)     Health Maintenance Due  Topic Date Due  . INFLUENZA VACCINE  01/18/2019    There are no preventive care reminders to display for this patient.  Lab Results  Component Value Date   TSH 4.03 09/27/2017   Lab Results  Component Value Date   WBC 7.8 05/10/2018   HGB 11.7 (L) 05/10/2018   HCT 34.3 (L) 05/10/2018   MCV 95.4 05/10/2018   PLT 325.0 05/10/2018   Lab Results  Component Value Date   NA 140 05/10/2018   K 4.0 05/10/2018   CO2 28 05/10/2018   GLUCOSE 94 05/10/2018   BUN 15 05/10/2018   CREATININE 1.08 05/10/2018   BILITOT  1.1 05/01/2018   ALKPHOS 56 05/01/2018   AST 24 05/01/2018   ALT 16 05/01/2018   PROT 7.6 05/01/2018   ALBUMIN 3.7 05/01/2018   CALCIUM 9.8 05/10/2018   ANIONGAP 11 05/04/2018   GFR 50.35 (L) 05/10/2018   No results found for: CHOL No results found for: HDL No results found for: LDLCALC No results found for: TRIG No results found for: CHOLHDL No results found for: HGBA1C    Assessment & Plan:  Problem List Items Addressed This Visit    None      No orders of the defined types were placed in this encounter.   Follow-up: No follow-ups on file.    Lenna Sciara, RN

## 2019-04-16 NOTE — Assessment & Plan Note (Addendum)
History of anemia.  Asymptomatic. Repeat CBC pending.   Agree with plan. Allie Bossier, NP-C

## 2019-04-16 NOTE — Assessment & Plan Note (Addendum)
Improved on Sertraline 25 mg.   Continue Zoloft.   Follow-up as needed.   Agree with plan, Allie Bossier, NP-C

## 2019-04-16 NOTE — Assessment & Plan Note (Signed)
No edema on exam. Continue to monitor.

## 2019-04-16 NOTE — Progress Notes (Signed)
Subjective:    Patient ID: [name], female    DOB: 03-Oct-1923, 85 y.o.   MRN: [id number]  HPI  Ms. Patridge is a 85 year old female who presents today for [place] Part 2.  She saw the health advisor last week.   Immunizations: -Tetanus: Completed in 2010 -Influenza: Due today -Shingles: Never completed -Pneumonia: Completed in 2015 and 2016  Diet: She endorses a fair diet, mostly home cooked meals. She lives with her daughter and son in [place]. Exercise: She is not exercising  Eye exam: Completed in 2019 Dental exam: Completed in 2019  Mammogram: Declines given age. Dexa: Declines Colonoscopy: No recent exam given age, none needed.  BP Readings from Last 3 Encounters:  04/16/19 128/80  06/07/18 130/70  05/10/18 140/78      Review of Systems  Constitutional: Negative for unexpected weight change.  HENT: Negative for rhinorrhea.   Respiratory: Negative for shortness of breath.   Cardiovascular: Negative for chest pain.  Gastrointestinal: Negative for constipation and diarrhea.  Genitourinary: Negative for difficulty urinating.  Musculoskeletal: Negative for arthralgias and myalgias.  Allergic/Immunologic: Negative for environmental allergies.  Neurological: Positive for tremors. Negative for dizziness and headaches.       Past Medical History:  Diagnosis Date  . Acute confusion   . Arthritis   . Emphysema lung ([place])   . Glaucoma   . Hypertension   . Hyponatremia   . Hypothyroidism   . Memory loss   . Poor peripheral circulation   . Tremors of nervous system      Social History   Socioeconomic History  . Marital status: Single    Spouse name: Not on file  . Number of children: 1  . Years of education: 11th grade  . Highest education level: Not on file  Occupational History  . Occupation: Retired  Scientific laboratory technician  . Financial resource strain: Not hard at all  . Food insecurity    Worry: Never true    Inability: Never true  . Transportation  needs    Medical: No    Non-medical: No  Tobacco Use  . Smoking status: Never Smoker  . Smokeless tobacco: Never Used  Substance and Sexual Activity  . Alcohol use: No    Alcohol/week: 0.0 standard drinks  . Drug use: No  . Sexual activity: Not Currently  Lifestyle  . Physical activity    Days per week: 0 days    Minutes per session: 0 min  . Stress: Not at all  Relationships  . Social Herbalist on phone: Not on file    Gets together: Not on file    Attends religious service: Not on file    Active member of club or organization: Not on file    Attends meetings of clubs or organizations: Not on file    Relationship status: Not on file  . Intimate partner violence    Fear of current or ex partner: No    Emotionally abused: No    Physically abused: No    Forced sexual activity: No  Other Topics Concern  . Not on file  Social History Narrative   Lives with daughter and son-in-law.   Right-handed.   Drinks one soda per day.    Past Surgical History:  Procedure Laterality Date  . BREAST BIOPSY      Family History  Problem Relation Age of Onset  . Heart attack Mother   . Other Father  unsure - she was only 63 years old when he died    Allergies  Allergen Reactions  . Augmentin [Amoxicillin-Pot Clavulanate] Rash  . Amlodipine Other (See Comments)    Ankle edema    Current Outpatient Medications on File Prior to Visit  Medication Sig Dispense Refill  . Calcium Carbonate (CALCIUM 600 PO) Take 600 mg by mouth 2 (two) times daily.    . Cholecalciferol (VITAMIN D3) 10000 units TABS Take 1,000 mg by mouth 2 (two) times daily.    Marland Kitchen donepezil (ARICEPT) 5 MG tablet TAKE 1 TABLET BY MOUTH EVERYDAY AT BEDTIME 90 tablet 1  . IRON PO Take 65 mg by mouth daily with lunch.    . levobunolol (BETAGAN) 0.5 % ophthalmic solution Place 1 drop into both eyes 2 (two) times daily.    Marland Kitchen levothyroxine (SYNTHROID) 25 MCG tablet TAKE 1 TABLET BY MOUTH DAILY WITHOUT FOOD,  WATER ONLY, DO NOT EAT WITHIN 30 MINUTES AFTER TAKING 90 tablet 0  . Melatonin-Pyridoxine (MELATIN PO) Take 1 tablet by mouth at bedtime as needed (sleep).    . propranolol (INDERAL) 80 MG tablet TAKE 2 TABLETS BY MOUTH TWICE A DAY FOR TREMOR 360 tablet 1  . sertraline (ZOLOFT) 25 MG tablet TAKE 1 TABLET BY MOUTH AT BEDTIME FOR DEPRESSION. 90 tablet 1   No current facility-administered medications on file prior to visit.     BP 128/80   Pulse 67   Temp 97.8 F (36.6 C) (Temporal)   Ht 5' (1.524 m)   Wt 127 lb (57.6 kg)   SpO2 98%   BMI 24.80 kg/m    Objective:   Physical Exam  Constitutional: She is oriented to person, place, and time. She appears well-nourished.  Neck: Neck supple.  Cardiovascular: Normal rate and regular rhythm.  Respiratory: Effort normal and breath sounds normal.  Neurological: She is alert and oriented to person, place, and time.  Resting tremor to head  Skin: Skin is warm and dry.  Elongated thickened toenails bilaterally. Left great toenail with abnormality, dried blood, mild erythema. Daughter attempted to clip.  Psychiatric: She has a normal mood and affect.           Assessment & Plan:

## 2019-04-16 NOTE — Patient Instructions (Signed)
Stop by the lab prior to leaving today. I will notify you of your results once received.   Take the tetanus vaccination to your pharmacy for administration.  Schedule an eye appointment with the opthalmologist.   You will be contacted regarding your referral to podiatry.  Please let us know if you have not been contacted within two weeks.   It was a pleasure to see you today!

## 2019-04-16 NOTE — Assessment & Plan Note (Signed)
Lungs clear on exam, no cough. Continue to monitor.

## 2019-04-16 NOTE — Assessment & Plan Note (Addendum)
Assessed. Need trimming.   Referral sent for podiatry today.  Agree with plan. Allie Bossier, NP-C

## 2019-04-16 NOTE — Assessment & Plan Note (Addendum)
Stable in office today.   Continue propanolol.  CMP and Lipids pending.   Agree with plan. Allie Bossier, NP-C

## 2019-04-17 LAB — TSH: TSH: 2.08 u[IU]/mL (ref 0.35–4.50)

## 2019-04-21 ENCOUNTER — Other Ambulatory Visit: Payer: Self-pay

## 2019-04-21 ENCOUNTER — Ambulatory Visit (INDEPENDENT_AMBULATORY_CARE_PROVIDER_SITE_OTHER): Payer: Medicare Other | Admitting: Podiatry

## 2019-04-21 ENCOUNTER — Encounter: Payer: Self-pay | Admitting: Podiatry

## 2019-04-21 DIAGNOSIS — L853 Xerosis cutis: Secondary | ICD-10-CM | POA: Diagnosis not present

## 2019-04-21 DIAGNOSIS — M79675 Pain in left toe(s): Secondary | ICD-10-CM

## 2019-04-21 DIAGNOSIS — B351 Tinea unguium: Secondary | ICD-10-CM | POA: Diagnosis not present

## 2019-04-21 DIAGNOSIS — M79674 Pain in right toe(s): Secondary | ICD-10-CM | POA: Diagnosis not present

## 2019-04-21 NOTE — Progress Notes (Signed)
  Subjective:  Patient ID: Nicole Downs, female    DOB: December 12, 1923,  MRN: [id number]  Chief Complaint  Patient presents with  . Nail Problem    nail trim and routine foot care   No complaints today   85 y.o. female returns for the above complaint.  She states that her toenails have been painful bilaterally.  She is here with her daughter today who has been taking care of her painful toenails.  She states that last time she attempted to cut her toenails and started cutting her skin which started bleeding.  She is unable to take care of these foot toenails as they have thickened elongated dystrophic.  She denies any other acute complaints.  She is at right ambulating in regular shoes.  Objective:  There were no vitals filed for this visit. Podiatric Exam: Vascular: dorsalis pedis and posterior tibial pulses are palpable bilateral. Capillary return is immediate. Temperature gradient is WNL. Skin turgor WNL  Sensorium: Normal Semmes Weinstein monofilament test. Normal tactile sensation bilaterally. Nail Exam: Pt has thick disfigured discolored nails with subungual debris noted bilateral entire nail hallux through fifth toenails Ulcer Exam: There is no evidence of ulcer or pre-ulcerative changes or infection. Orthopedic Exam: Muscle tone and strength are WNL. No limitations in general ROM. No crepitus or effusions noted. HAV  B/L.  Hammer toes 2-5  B/L. Skin: No Porokeratosis. No infection or ulcers  Assessment & Plan:  Patient was evaluated and treated and all questions answered.  Onychomycosis with pain  -Nails palliatively debrided as below. -Educated on self-care  Procedure: Nail Debridement Rationale: pain  Type of Debridement: manual, sharp debridement. Instrumentation: Nail nipper, rotary burr. Number of Nails: 10  Procedures and Treatment: Consent by patient was obtained for treatment procedures. The patient understood the discussion of treatment and procedures well. All  questions were answered thoroughly reviewed. Debridement of mycotic and hypertrophic toenails, 1 through 5 bilateral and clearing of subungual debris. No ulceration, no infection noted.  Return Visit-Office Procedure: Patient instructed to return to the office for a follow up visit 3 months for continued evaluation and treatment.  Boneta Lucks, DPM    Return in about 3 months (around 07/22/2019) for Routine Foot Care.

## 2019-04-23 ENCOUNTER — Other Ambulatory Visit: Payer: Self-pay | Admitting: Primary Care

## 2019-04-30 DIAGNOSIS — H2513 Age-related nuclear cataract, bilateral: Secondary | ICD-10-CM | POA: Diagnosis not present

## 2019-05-12 DIAGNOSIS — I10 Essential (primary) hypertension: Secondary | ICD-10-CM

## 2019-05-13 ENCOUNTER — Ambulatory Visit: Payer: Medicare Other | Admitting: *Deleted

## 2019-05-13 VITALS — BP 158/80 | HR 48

## 2019-05-13 DIAGNOSIS — I10 Essential (primary) hypertension: Secondary | ICD-10-CM

## 2019-05-13 MED ORDER — LOSARTAN POTASSIUM 25 MG PO TABS: 25.0000 mg | ORAL_TABLET | Freq: Every day | ORAL | 1 refills | Status: DC

## 2019-05-13 NOTE — Progress Notes (Signed)
Patient here for nurse visit BP check per order from Alma Friendly, NP.   Patient reports compliance with prescribed BP medications: yes   BP Readings from Last 3 Encounters:  04/16/19 128/80  06/07/18 130/70  05/10/18 140/78   Pulse Readings from Last 3 Encounters:  04/16/19 67  06/07/18 63  05/10/18 (!) 56   Wanda (daughter) reports that the readings they have been getting at home have been ranging from [id number] systolic.   Per Allie Bossier, NP she will contact the patients daughter later today with recommendations.   Patient verbalized understanding of instructions.   Safiyah Cisney M, CMA

## 2019-05-13 NOTE — Telephone Encounter (Signed)
Nicole Downs, will you call [name] to have [name]'s BP checked today manually?

## 2019-05-13 NOTE — Telephone Encounter (Signed)
Noted. Patient has already made the nurse visit appt

## 2019-05-26 ENCOUNTER — Other Ambulatory Visit: Payer: Self-pay | Admitting: Primary Care

## 2019-05-26 DIAGNOSIS — F331 Major depressive disorder, recurrent, moderate: Secondary | ICD-10-CM

## 2019-05-26 DIAGNOSIS — G250 Essential tremor: Secondary | ICD-10-CM

## 2019-07-15 DIAGNOSIS — L578 Other skin changes due to chronic exposure to nonionizing radiation: Secondary | ICD-10-CM | POA: Diagnosis not present

## 2019-07-15 DIAGNOSIS — C44311 Basal cell carcinoma of skin of nose: Secondary | ICD-10-CM | POA: Diagnosis not present

## 2019-07-22 ENCOUNTER — Other Ambulatory Visit: Payer: Self-pay

## 2019-07-22 ENCOUNTER — Encounter: Payer: Self-pay | Admitting: Podiatry

## 2019-07-22 ENCOUNTER — Ambulatory Visit (INDEPENDENT_AMBULATORY_CARE_PROVIDER_SITE_OTHER): Payer: Medicare Other | Admitting: Podiatry

## 2019-07-22 DIAGNOSIS — L853 Xerosis cutis: Secondary | ICD-10-CM | POA: Diagnosis not present

## 2019-07-22 DIAGNOSIS — B351 Tinea unguium: Secondary | ICD-10-CM

## 2019-07-22 DIAGNOSIS — M79674 Pain in right toe(s): Secondary | ICD-10-CM | POA: Diagnosis not present

## 2019-07-22 DIAGNOSIS — M79675 Pain in left toe(s): Secondary | ICD-10-CM

## 2019-07-22 NOTE — Progress Notes (Signed)
  Subjective:  Patient ID: Nicole Downs, female    DOB: 1923/07/06,  MRN: [id number]  Chief Complaint  Patient presents with  . Nail Problem    pt is here for a 3 month nail trim   86 y.o. female returns for the above complaint.  She states that her toenails have been painful bilaterally.  She is here with her daughter today who has been taking care of her painful toenails.  She states that last time she attempted to cut her toenails and started cutting her skin which started bleeding.  She is unable to take care of these foot toenails as they have thickened elongated dystrophic.  She denies any other acute complaints.  She is at right ambulating in regular shoes.  Patient also has secondary complaint of xerotic skin.  Patient denies using any lotion to the lower extremity.  She would like to know if there is anything else that can be further done for this.  Objective:  There were no vitals filed for this visit. Podiatric Exam: Vascular: dorsalis pedis and posterior tibial pulses are palpable bilateral. Capillary return is immediate. Temperature gradient is WNL. Skin turgor WNL  Sensorium: Normal Semmes Weinstein monofilament test. Normal tactile sensation bilaterally. Nail Exam: Pt has thick disfigured discolored nails with subungual debris noted bilateral entire nail hallux through fifth toenails Ulcer Exam: There is no evidence of ulcer or pre-ulcerative changes or infection. Orthopedic Exam: Muscle tone and strength are WNL. No limitations in general ROM. No crepitus or effusions noted. HAV  B/L.  Hammer toes 2-5  B/L. Skin: No Porokeratosis. No infection or ulcers.  Dry skin noted bilateral lower extremity consistent with xerosis  Assessment & Plan:  Patient was evaluated and treated and all questions answered.  Xerosis bilateral lower extremity -I explained to the patient the etiology of xerosis and various treatment options were discussed with the patient.  I explained to the  patient that she will need to apply over-the-counter lotion for dry skin twice a day.  Patient states understanding and will try her best to apply lotion.  Onychomycosis with pain  -Nails palliatively debrided as below. -Educated on self-care  Procedure: Nail Debridement Rationale: pain  Type of Debridement: manual, sharp debridement. Instrumentation: Nail nipper, rotary burr. Number of Nails: 10  Procedures and Treatment: Consent by patient was obtained for treatment procedures. The patient understood the discussion of treatment and procedures well. All questions were answered thoroughly reviewed. Debridement of mycotic and hypertrophic toenails, 1 through 5 bilateral and clearing of subungual debris. No ulceration, no infection noted.  Return Visit-Office Procedure: Patient instructed to return to the office for a follow up visit 3 months for continued evaluation and treatment.  Boneta Lucks, DPM    No follow-ups on file.

## 2019-07-30 DIAGNOSIS — D225 Melanocytic nevi of trunk: Secondary | ICD-10-CM | POA: Diagnosis not present

## 2019-08-18 DIAGNOSIS — L578 Other skin changes due to chronic exposure to nonionizing radiation: Secondary | ICD-10-CM | POA: Diagnosis not present

## 2019-08-18 DIAGNOSIS — L820 Inflamed seborrheic keratosis: Secondary | ICD-10-CM | POA: Diagnosis not present

## 2019-08-18 DIAGNOSIS — Z85828 Personal history of other malignant neoplasm of skin: Secondary | ICD-10-CM | POA: Diagnosis not present

## 2019-08-18 DIAGNOSIS — L570 Actinic keratosis: Secondary | ICD-10-CM | POA: Diagnosis not present

## 2019-09-08 NOTE — Telephone Encounter (Signed)
Nicole Downs, see message. Do I need to send this to [facility]? Anyone else? Social Worker?

## 2019-10-01 ENCOUNTER — Other Ambulatory Visit: Payer: Self-pay | Admitting: Primary Care

## 2019-10-20 ENCOUNTER — Other Ambulatory Visit: Payer: Self-pay

## 2019-10-20 ENCOUNTER — Encounter: Payer: Self-pay | Admitting: Podiatry

## 2019-10-20 ENCOUNTER — Ambulatory Visit (INDEPENDENT_AMBULATORY_CARE_PROVIDER_SITE_OTHER): Payer: Medicare Other | Admitting: Podiatry

## 2019-10-20 VITALS — Temp 97.7°F

## 2019-10-20 DIAGNOSIS — M79675 Pain in left toe(s): Secondary | ICD-10-CM | POA: Diagnosis not present

## 2019-10-20 DIAGNOSIS — B351 Tinea unguium: Secondary | ICD-10-CM | POA: Diagnosis not present

## 2019-10-20 DIAGNOSIS — R0989 Other specified symptoms and signs involving the circulatory and respiratory systems: Secondary | ICD-10-CM | POA: Diagnosis not present

## 2019-10-20 DIAGNOSIS — M79674 Pain in right toe(s): Secondary | ICD-10-CM

## 2019-10-20 NOTE — Progress Notes (Signed)
This patient returns to my office for at risk foot care.  This patient requires this care by a professional since this patient will be at risk due to having poor circulation.  This patient is unable to cut nails herself since the patient cannot reach her nails.These nails are painful walking and wearing shoes.  This patient presents for at risk foot care today.  General Appearance  Alert, conversant and in no acute stress.  Vascular  Dorsalis pedis and posterior tibial  pulses are  Weakly palpable  bilaterally.  Capillary return is within normal limits  bilaterally. Cold feet  bilaterally.  Neurologic  Senn-Weinstein monofilament wire test within normal limits  bilaterally. Muscle power within normal limits bilaterally.  Nails Thick disfigured discolored nails with subungual debris  from hallux to fifth toes bilaterally. No evidence of bacterial infection or drainage bilaterally.  Orthopedic  No limitations of motion  feet .  No crepitus or effusions noted.  No bony pathology or digital deformities noted.  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  HAV with overlapping second toe right foot.  Skin  normotropic skin with no porokeratosis noted bilaterally.  No signs of infections or ulcers noted.     Onychomycosis  Pain in right toes  Pain in left toes  Consent was obtained for treatment procedures.   Mechanical debridement of nails 1-5  bilaterally performed with a nail nipper.  Filed with dremel without incident.    Return office visit     3 months                 Told patient to return for periodic foot care and evaluation due to potential at risk complications.   Gardiner Barefoot DPM

## 2019-10-27 DIAGNOSIS — H401131 Primary open-angle glaucoma, bilateral, mild stage: Secondary | ICD-10-CM | POA: Diagnosis not present

## 2019-10-31 ENCOUNTER — Other Ambulatory Visit: Payer: Self-pay | Admitting: Primary Care

## 2019-10-31 DIAGNOSIS — I10 Essential (primary) hypertension: Secondary | ICD-10-CM

## 2019-11-04 ENCOUNTER — Telehealth: Payer: Self-pay | Admitting: Primary Care

## 2019-11-04 NOTE — Progress Notes (Signed)
  Chronic Care Management   Outreach Note  11/04/2019 Name: Nicole Downs MRN: [id number] DOB: [birth year]  Referred by: Pleas Koch, NP Reason for referral : No chief complaint on file.   An unsuccessful telephone outreach was attempted today. The patient was referred to the pharmacist for assistance with care management and care coordination.   This note is not being shared with the patient for the following reason: To respect privacy (The patient or proxy has requested that the information not be shared).  Follow Up Plan:   Nicole Downs Upstream Scheduler

## 2019-11-05 ENCOUNTER — Telehealth: Payer: Self-pay | Admitting: Primary Care

## 2019-11-05 NOTE — Progress Notes (Signed)
  Chronic Care Management   Note  11/05/2019 Name: Nicole Downs MRN: [id number] DOB: 04/18/1924  Nicole Downs is a 87 y.o. year old female who is a primary care patient of Pleas Koch, NP. I reached out to [facility] by phone today in response to a referral sent by Nicole Downs's PCP, Pleas Koch, NP.   Nicole Downs was given information about Chronic Care Management services today including:  1. CCM service includes personalized support from designated clinical staff supervised by her physician, including individualized plan of care and coordination with other care providers 2. 24/7 contact phone numbers for assistance for urgent and routine care needs. 3. Service will only be billed when office clinical staff spend 20 minutes or more in a month to coordinate care. 4. Only one practitioner may furnish and bill the service in a calendar month. 5. The patient may stop CCM services at any time (effective at the end of the month) by phone call to the office staff.   Patient agreed to services and verbal consent obtained.   This note is not being shared with the patient for the following reason: To respect privacy (The patient or proxy has requested that the information not be shared).  Follow up plan:   Earney Hamburg Upstream Scheduler

## 2019-12-01 ENCOUNTER — Ambulatory Visit (INDEPENDENT_AMBULATORY_CARE_PROVIDER_SITE_OTHER): Payer: Medicare Other | Admitting: Dermatology

## 2019-12-01 ENCOUNTER — Other Ambulatory Visit: Payer: Self-pay

## 2019-12-01 DIAGNOSIS — C44319 Basal cell carcinoma of skin of other parts of face: Secondary | ICD-10-CM

## 2019-12-01 DIAGNOSIS — L821 Other seborrheic keratosis: Secondary | ICD-10-CM | POA: Diagnosis not present

## 2019-12-01 DIAGNOSIS — L570 Actinic keratosis: Secondary | ICD-10-CM

## 2019-12-01 DIAGNOSIS — L820 Inflamed seborrheic keratosis: Secondary | ICD-10-CM | POA: Diagnosis not present

## 2019-12-01 DIAGNOSIS — D044 Carcinoma in situ of skin of scalp and neck: Secondary | ICD-10-CM

## 2019-12-01 DIAGNOSIS — C44212 Basal cell carcinoma of skin of right ear and external auricular canal: Secondary | ICD-10-CM

## 2019-12-01 DIAGNOSIS — L578 Other skin changes due to chronic exposure to nonionizing radiation: Secondary | ICD-10-CM

## 2019-12-01 DIAGNOSIS — D485 Neoplasm of uncertain behavior of skin: Secondary | ICD-10-CM

## 2019-12-01 NOTE — Progress Notes (Signed)
Follow-Up Visit   Subjective  Nicole Downs is a 86 y.o. female who presents for the following: Actinic Keratosis (recheck previously treated areas - L cheek, L distal dorsum nose, R cheek x 3, L mandible x 3). The patient daughter is present with her and answers questions and gives history. She has sores on her right neck and below her right ear that she has been picking at and will not heal.  The following portions of the chart were reviewed this encounter and updated as appropriate:  Tobacco  Allergies  Meds  Problems  Med Hx  Surg Hx  Fam Hx     Review of Systems:  No other skin or systemic complaints except as noted in HPI or Assessment and Plan.  Objective  Well appearing patient in no apparent distress; mood and affect are within normal limits.  A focused examination was performed including the face, neck, and ears. Relevant physical exam findings are noted in the Assessment and Plan.  Objective  R neck x 2 (2): Erythematous thin papules/macules with gritty scale.   Objective  at the junction of the right mandible and earlobe: 1.0 cm Crusted indurated papule   Objective  R neck sup: 1.0 cm crusted papule   Objective  R neck inf: 1.0 crusted papule   Objective  R neck x 1: Erythematous keratotic or waxy stuck-on papule or plaque.    Assessment & Plan  AK (actinic keratosis) (2) R neck x 2  Destruction of lesion - R neck x 2 Complexity: simple   Destruction method: cryotherapy   Informed consent: discussed and consent obtained   Timeout:  patient name, date of birth, surgical site, and procedure verified Lesion destroyed using liquid nitrogen: Yes   Region frozen until ice ball extended beyond lesion: Yes   Outcome: patient tolerated procedure well with no complications   Post-procedure details: wound care instructions given    Neoplasm of uncertain behavior of skin (3) at the junction of the right mandible and earlobe  Epidermal / dermal  shaving  Informed consent: discussed and consent obtained   Timeout: patient name, date of birth, surgical site, and procedure verified   Procedure prep:  Patient was prepped and draped in usual sterile fashion Prep type:  Isopropyl alcohol Anesthesia: the lesion was anesthetized in a standard fashion   Anesthetic:  1% lidocaine w/ epinephrine 1-100,000 buffered w/ 8.4% NaHCO3 Instrument used: flexible razor blade   Hemostasis achieved with: pressure, aluminum chloride and electrodesiccation   Outcome: patient tolerated procedure well   Post-procedure details: sterile dressing applied and wound care instructions given   Dressing type: bandage and petrolatum   Additional details:  Post tx defect 1.3 cm  Destruction of lesion Complexity: extensive   Destruction method: electrodesiccation and curettage   Informed consent: discussed and consent obtained   Timeout:  patient name, date of birth, surgical site, and procedure verified Procedure prep:  Patient was prepped and draped in usual sterile fashion Prep type:  Isopropyl alcohol Anesthesia: the lesion was anesthetized in a standard fashion   Anesthetic:  1% lidocaine w/ epinephrine 1-100,000 buffered w/ 8.4% NaHCO3 Curettage performed in three different directions: Yes   Electrodesiccation performed over the curetted area: Yes   Hemostasis achieved with:  pressure, aluminum chloride and electrodesiccation Outcome: patient tolerated procedure well with no complications   Post-procedure details: sterile dressing applied and wound care instructions given   Dressing type: bandage and petrolatum   Additional details:  Post tx defect  1.3 cm   Specimen 1 - Surgical pathology Differential Diagnosis:  Check Margins: No 1.0 cm Crusted indurated papule  R neck sup  Epidermal / dermal shaving  Lesion diameter (cm):  1 Informed consent: discussed and consent obtained   Timeout: patient name, date of birth, surgical site, and procedure  verified   Procedure prep:  Patient was prepped and draped in usual sterile fashion Prep type:  Isopropyl alcohol Anesthesia: the lesion was anesthetized in a standard fashion   Anesthetic:  1% lidocaine w/ epinephrine 1-100,000 buffered w/ 8.4% NaHCO3 Instrument used: flexible razor blade   Hemostasis achieved with: pressure, aluminum chloride and electrodesiccation   Outcome: patient tolerated procedure well   Post-procedure details: sterile dressing applied and wound care instructions given   Dressing type: bandage and petrolatum   Additional details:  1.3 cm post tx defect   Destruction of lesion Complexity: extensive   Destruction method: electrodesiccation and curettage   Informed consent: discussed and consent obtained   Timeout:  patient name, date of birth, surgical site, and procedure verified Procedure prep:  Patient was prepped and draped in usual sterile fashion Prep type:  Isopropyl alcohol Anesthesia: the lesion was anesthetized in a standard fashion   Anesthetic:  1% lidocaine w/ epinephrine 1-100,000 buffered w/ 8.4% NaHCO3 Curettage performed in three different directions: Yes   Electrodesiccation performed over the curetted area: Yes   Lesion length (cm):  1 Lesion width (cm):  1 Hemostasis achieved with:  pressure, aluminum chloride and electrodesiccation Outcome: patient tolerated procedure well with no complications   Post-procedure details: sterile dressing applied and wound care instructions given   Dressing type: bandage and petrolatum   Additional details:  Post tx defect 1.3 cm   Specimen 2 - Surgical pathology Differential Diagnosis:  Check Margins: No 1.0 cm crusted papule  R neck inf  Epidermal / dermal shaving  Informed consent: discussed and consent obtained   Timeout: patient name, date of birth, surgical site, and procedure verified   Procedure prep:  Patient was prepped and draped in usual sterile fashion Prep type:  Isopropyl  alcohol Anesthesia: the lesion was anesthetized in a standard fashion   Anesthetic:  1% lidocaine w/ epinephrine 1-100,000 buffered w/ 8.4% NaHCO3 Instrument used: flexible razor blade   Hemostasis achieved with: pressure, aluminum chloride and electrodesiccation   Outcome: patient tolerated procedure well   Post-procedure details: sterile dressing applied and wound care instructions given   Dressing type: bandage and petrolatum   Additional details:  Post tx defect 1.3 cm   Destruction of lesion Complexity: extensive   Destruction method: electrodesiccation and curettage   Informed consent: discussed and consent obtained   Timeout:  patient name, date of birth, surgical site, and procedure verified Procedure prep:  Patient was prepped and draped in usual sterile fashion Prep type:  Isopropyl alcohol Anesthesia: the lesion was anesthetized in a standard fashion   Anesthetic:  1% lidocaine w/ epinephrine 1-100,000 buffered w/ 8.4% NaHCO3 Curettage performed in three different directions: Yes   Electrodesiccation performed over the curetted area: Yes   Hemostasis achieved with:  pressure, aluminum chloride and electrodesiccation Outcome: patient tolerated procedure well with no complications   Post-procedure details: sterile dressing applied and wound care instructions given   Dressing type: bandage and petrolatum   Additional details:  Post tx defect 1.3 cm   Specimen 3 - Surgical pathology Differential Diagnosis:  Check Margins: No 1.0 crusted papule  Inflamed seborrheic keratosis R neck x 1  Destruction  of lesion - R neck x 1 Complexity: simple   Destruction method: cryotherapy   Informed consent: discussed and consent obtained   Timeout:  patient name, date of birth, surgical site, and procedure verified Lesion destroyed using liquid nitrogen: Yes   Region frozen until ice ball extended beyond lesion: Yes   Outcome: patient tolerated procedure well with no complications    Post-procedure details: wound care instructions given     Actinic Damage - diffuse scaly erythematous macules with underlying dyspigmentation - Recommend daily broad spectrum sunscreen SPF 30+ to sun-exposed areas, reapply every 2 hours as needed.  - Call for new or changing lesions.  Seborrheic Keratoses - Stuck-on, waxy, tan-brown papules and plaques  - Discussed benign etiology and prognosis. - Observe - Call for any changes  Return in about 3 months (around 03/02/2020).  Luther Redo, CMA, am acting as scribe for Sarina Ser, MD .  Documentation: I have reviewed the above documentation for accuracy and completeness, and I agree with the above.  Sarina Ser, MD

## 2019-12-01 NOTE — Patient Instructions (Signed)

## 2019-12-03 ENCOUNTER — Telehealth: Payer: Self-pay

## 2019-12-03 ENCOUNTER — Encounter: Payer: Self-pay | Admitting: Dermatology

## 2019-12-03 NOTE — Telephone Encounter (Signed)
Nicole Downs informed of pathology results. 

## 2019-12-03 NOTE — Telephone Encounter (Signed)
-----   Message from Ralene Bathe, MD sent at 12/02/2019  7:09 PM EDT ----- 1. Skin , at the junction of the right mandible and earlobe BASAL CELL CARCINOMA, NODULAR PATTERN 2. Skin , right neck sup SQUAMOUS CELL CARCINOMA IN SITU, HYPERTROPHIC, BASE INVOLVED 3. Skin , right neck inf SQUAMOUS CELL CARCINOMA IN SITU, HYPERTROPHIC, BASE INVOLVED  1,2,3 - All 3 Cancer All already treated Recheck next visit

## 2019-12-06 ENCOUNTER — Other Ambulatory Visit: Payer: Self-pay | Admitting: Primary Care

## 2019-12-06 DIAGNOSIS — F331 Major depressive disorder, recurrent, moderate: Secondary | ICD-10-CM

## 2019-12-25 ENCOUNTER — Other Ambulatory Visit: Payer: Self-pay | Admitting: Primary Care

## 2019-12-25 DIAGNOSIS — G250 Essential tremor: Secondary | ICD-10-CM

## 2020-01-07 NOTE — Chronic Care Management (AMB) (Signed)
Chronic Care Management Pharmacy  Name: Nicole Downs  MRN: [id number] DOB: [birth year]  Chief Complaint/ HPI  [name],  86 y.o., female presents for their Initial CCM visit with the clinical pharmacist via telephone. Visit completed with patient's daughter, [name].   PCP : Pleas Koch, NP  Their chronic conditions include: hypertension, hypothyroidism, dementia, essential tremor, vitamin D deficiency, diverticulitis, glaucoma, anemia, major depressive disorder   Caregiver concerns: Reports biggest health concern is tremors. Pt has seen neurology, tried multiple medications and no benefit. Reports patient does not want to share any problems with her. Describes difficult caregiver situation.  Office Visits:  05/27/19: Message MyChart - PCP reviewed home BP readings, maintain same dose of losartan due to BP fluctuations from 100s to 150s  04/16/19: PCP visit - continue current medications, labs done, schedule eye exam and podiatry appt   Consult Visit:  12/01/19: Dermatology - actinic keratosis follow up  10/20/19: Podiatry - pain due to onychomycosis of toenails, both feet  Medications: Outpatient Encounter Medications as of 01/08/2020  Medication Sig  . Calcium Carbonate (CALCIUM 600 PO) Take 600 mg by mouth 2 (two) times daily.  . Cholecalciferol (VITAMIN D3) 10000 units TABS Take 1,000 mg by mouth 2 (two) times daily.  Marland Kitchen donepezil (ARICEPT) 5 MG tablet TAKE 1 TABLET BY MOUTH EVERYDAY AT BEDTIME  . IRON PO Take 65 mg by mouth daily with lunch.  . levobunolol (BETAGAN) 0.5 % ophthalmic solution Place 1 drop into both eyes 2 (two) times daily.  Marland Kitchen levothyroxine (SYNTHROID) 25 MCG tablet TAKE 1 TABLET BY MOUTH DAILY WITHOUT FOOD, WATER ONLY, DO NOT EAT WITHIN 30 MINUTES AFTER TAKING  . losartan (COZAAR) 25 MG tablet TAKE 1 TABLET BY MOUTH EVERY DAY FOR BLOOD PRESSURE  . Melatonin-Pyridoxine (MELATIN PO) Take 1 tablet by mouth at bedtime as needed (sleep).  .  propranolol (INDERAL) 80 MG tablet TAKE 2 TABLETS BY MOUTH TWICE A DAY FOR TREMOR  . sertraline (ZOLOFT) 25 MG tablet TAKE 1 TABLET BY MOUTH AT BEDTIME FOR DEPRESSION.  . BETIMOL 0.5 % ophthalmic solution Apply 1 drop to eye 2 (two) times daily. (Patient not taking: Reported on 01/11/2020)   No facility-administered encounter medications on file as of 01/08/2020.   Current Diagnosis/Assessment:  SDOH Interventions     Most Recent Value  SDOH Interventions  Financial Strain Interventions Intervention Not Indicated     Goals Addressed            This Visit's Progress   . Pharmacy Care Plan       CARE PLAN ENTRY (see longitudinal plan of care for additional care plan information)  Current Barriers:  . Chronic Disease Management support, education, and care coordination needs related to Hypertension and Anemia   Hypertension BP Readings from Last 3 Encounters:  05/13/19 (!) 158/80  04/16/19 128/80  06/07/18 130/70 .  Pharmacist Clinical Goal(s): o Over the next 6 months, patient will work with PharmD and providers to maintain BP goal <140/90 and prevent hypotension < 90/60 mmHg . Current regimen:  o Losartan 25 mg - 1 tablet daily . Interventions: o Comprehensive medication review for safety . Patient self care activities - Over the next 6 months, patient will: o Check BP once a month, document, and provide at future appointments o Call if any readings < 90/60 or falls  Anemia . Pharmacist Clinical Goal(s) o Over the next 6 months, patient will work with PharmD and providers to reduce unnecessary medications . Current regimen:  Iron 65 mg - 1 tablet daily at lunch . Interventions: o Consult PCP to review iron labs  . Patient self care activities - Over the next 6 months, patient will: o Discontinue iron for now o Repeat labs at annual visit in October o Please call if you notice any concerns/changes in symptoms   Initial goal documentation      Hypertension    CMP Latest Ref Rng & Units 04/16/2019 05/10/2018 05/04/2018  Glucose 70 - 99 mg/dL 98 94 84  BUN 6 - 23 mg/dL 21 15 19   Creatinine 0.40 - 1.20 mg/dL 1.22(H) 1.08 1.51(H)  Sodium 135 - 145 mEq/L 140 140 137  Potassium 3.5 - 5.1 mEq/L 4.1 4.0 3.5  Chloride 96 - 112 mEq/L 103 105 105  CO2 19 - 32 mEq/L 30 28 21(L)  Calcium 8.4 - 10.5 mg/dL 9.6 9.8 8.2(L)  Total Protein 6.0 - 8.3 g/dL 7.3 - -  Total Bilirubin 0.2 - 1.2 mg/dL 0.5 - -  Alkaline Phos 39 - 117 U/L 58 - -  AST 0 - 37 U/L 21 - -  ALT 0 - 35 U/L 17 - -   Office blood pressures are: BP Readings from Last 3 Encounters:  05/13/19 (!) 158/80  04/16/19 128/80  06/07/18 130/70   Patient has tried these meds in the past: lisinopril (cough), amlodipine Patient checks BP at home infrequently Patient home BP readings are ranging: none reported  BP goal < 140/90 mmHg Patient is currently controlled on the following medications:   Losartan 25 mg - 1 tablet daily  Adherence: < 5 day gap between refills Denies falls or concerns   Plan: Continue current medications  Dementia   Patient has failed these meds in past: none reported  Patient is currently controlled on the following medications:   Donepezil 5 mg - 1 tablet at bedtime  We discussed: difficult for caregiver to assess as patient keeps to herself, but denies concerns and confirms adherence; reports some improvement once she stopped Tylenol PM qhs   Plan: Continue current medications   Essential Tremor   Patient has failed these meds in past: primidone - memory loss  Patient is currently uncontrolled on the following medications:   Propranolol 80 mg - 2 tablets BID   We discussed: Caregiver reports this is patient's biggest concern. She has been referred to neurology and tried multiple medications, not interested in further med changes. Started propranolol in 2018.   Plan: Continue current medications   Depression   Patient has failed these meds in past:  none Patient is currently controlled on the following medications:   Sertraline 25 mg - 1 tablet qhs   Melatonin-pyridoxine - 1 tablet qhs PRN  We discussed: confirms adherence, denies concerns   Plan: Continue current medications  Hypothyroidism   Lab Results  Component Value Date/Time   TSH 2.08 04/16/2019 10:40 AM   TSH 4.03 09/27/2017 11:41 AM   Patient has failed these meds in past: none Patient is currently controlled on the following medications:  . Levothyroxine 25 mcg - 1 tablet daily on empty stomach  We discussed: confirms adherence, denies concerns, TSH WNL  Plan: Continue current medications   Vitamin D Deficiency   Vitamin D 04/10/2018: 104 Patient has failed these meds in past: none reported Patient is currently controlled on the following medications:   Vitamin D 1000 IU - 1 capsule BID  Calcium 600 mg - 1 tab BID for bone health  Plan: Continue current medications  Anemia   Iron/TIBC/Ferritin/ %Sat    Component Value Date/Time   IRON 84 10/08/2015 1734   IRONPCTSAT 23.7 10/08/2015 1734   CBC Latest Ref Rng & Units 04/16/2019 05/10/2018 05/03/2018  WBC 4.0 - 10.5 K/uL 9.9 7.8 10.2  Hemoglobin 12.0 - 15.0 g/dL 13.2 11.7(L) 10.2(L)  Hematocrit 36 - 46 % 39.2 34.3(L) 32.2(L)  Platelets 150 - 400 K/uL 250.0 325.0 207   Patient has failed these meds in past: none reported Patient is currently controlled on the following medications:   Iron 65 mg - 1 tablet daily at lunch  Stool softener - 1 every other night  GasX - 1 tablet TID  We discussed: Caregiver reports the patient started iron per her sister's recommendation. She would like to know if patient needs to continue.  Plan: Consult PCP regarding discontinuation of iron. Repeat labs in October.   Glaucoma   Patient has failed these meds in past: none reported Patient is currently on the following medications:   Levobunolol 0.5% - 1 drop BID  Refresh Eyes - 1 drop BID    Plan:  Continue current medications  OTCs    Tylenol 325 mg - 1 tablet at bedtime  Glucosamine - 1 tablet daily  We discussed: Pain - patient does not report pain but rubs her legs/hands at times. Caregiver gives her Tylenol every night to help her sleep.  Plan: Continue current medications   Medication Management:  Pharmacy: [facility]   Adherence: daughter fills pillbox every 4 weeks  Social support: lives with daughter who is the primary caregiver  Affordability: denies cost concerns   CCM Follow Up: 6 months, telephone call   Debbora Dus, PharmD Clinical Pharmacist [facility] [phone]

## 2020-01-08 ENCOUNTER — Ambulatory Visit: Payer: Medicare Other

## 2020-01-08 ENCOUNTER — Other Ambulatory Visit: Payer: Self-pay

## 2020-01-08 ENCOUNTER — Telehealth: Payer: Self-pay

## 2020-01-08 DIAGNOSIS — I10 Essential (primary) hypertension: Secondary | ICD-10-CM

## 2020-01-08 DIAGNOSIS — D649 Anemia, unspecified: Secondary | ICD-10-CM

## 2020-01-08 NOTE — Telephone Encounter (Signed)
Spoke to patient's daughter, Nicole Downs, today for initial CCM visit. She would like to know if Ellasyn should continue iron 65 mg daily. She reports it was started over the counter by patient's sister. She does have some constipation for which she takes a stool softener every other day. Per chart review, last iron panel was completed in 2017.   Iron/TIBC/Ferritin/ %Sat    Component Value Date/Time   IRON 84 10/08/2015 1734   IRONPCTSAT 23.7 10/08/2015 1734   CBC Latest Ref Rng & Units 04/16/2019 05/10/2018 05/03/2018  WBC 4.0 - 10.5 K/uL 9.9 7.8 10.2  Hemoglobin 12.0 - 15.0 g/dL 13.2 11.7(L) 10.2(L)  Hematocrit 36 - 46 % 39.2 34.3(L) 32.2(L)  Platelets 150 - 400 K/uL 250.0 325.0 [place], PharmD Clinical Pharmacist [facility] [phone]

## 2020-01-08 NOTE — Telephone Encounter (Signed)
We can stop oral iron for now, repeat CBC and add on iron studies in October during her annual lab draw/physical.  Have Nicole Downs update [place] with any changes in patient.

## 2020-01-11 NOTE — Progress Notes (Signed)
I have collaborated with the care management provider regarding care management and care coordination activities outlined in this encounter and have reviewed this encounter including documentation in the note and care plan. I am certifying that I agree with the content of this note and encounter as supervising physician.  

## 2020-01-11 NOTE — Patient Instructions (Addendum)
Dear Nicole Downs and Nicole Downs,  It was a pleasure meeting you during our initial appointment on January 08, 2020. Below is a summary of the goals we discussed and components of chronic care management. Please contact me anytime with questions or concerns.   Visit Information  Goals Addressed            This Visit's Progress   . Pharmacy Care Plan       CARE PLAN ENTRY (see longitudinal plan of care for additional care plan information)  Current Barriers:  . Chronic Disease Management support, education, and care coordination needs related to Hypertension and Anemia   Hypertension BP Readings from Last 3 Encounters:  05/13/19 (!) 158/80  04/16/19 128/80  06/07/18 130/70 .  Pharmacist Clinical Goal(s): o Over the next 6 months, patient will work with PharmD and providers to maintain BP goal <140/90 and prevent hypotension < 90/60 mmHg . Current regimen:  o Losartan 25 mg - 1 tablet daily . Interventions: o Comprehensive medication review for safety . Patient self care activities - Over the next 6 months, patient will: o Check BP once a month, document, and provide at future appointments o Call if any readings < 90/60 or falls  Anemia . Pharmacist Clinical Goal(s) o Over the next 6 months, patient will work with PharmD and providers to reduce unnecessary medications . Current regimen:   Iron 65 mg - 1 tablet daily at lunch . Interventions: o Consult PCP to review iron labs  . Patient self care activities - Over the next 6 months, patient will: o Discontinue iron for now o Repeat labs at annual visit in October o Please call if you notice any concerns/changes in symptoms   Initial goal documentation      Nicole Downs was given information about Chronic Care Management services today including:  1. CCM service includes personalized support from designated clinical staff supervised by her physician, including individualized plan of care and coordination with  other care providers 2. 24/7 contact phone numbers for assistance for urgent and routine care needs. 3. Standard insurance, coinsurance, copays and deductibles apply for chronic care management only during months in which we provide at least 20 minutes of these services. Most insurances cover these services at 100%, however patients may be responsible for any copay, coinsurance and/or deductible if applicable. This service may help you avoid the need for more expensive face-to-face services. 4. Only one practitioner may furnish and bill the service in a calendar month. 5. The patient may stop CCM services at any time (effective at the end of the month) by phone call to the office staff.  Patient agreed to services and verbal consent obtained.   The patient verbalized understanding of instructions provided today and agreed to receive a mailed copy of patient instruction and/or educational materials. Telephone follow up appointment with pharmacy team member scheduled for: 07/09/20 at 9 AM (telephone)  Debbora Dus, PharmD Clinical Pharmacist [facility] [phone]   Basics of Medicine Management Taking your medicines correctly is an important part of managing or preventing medical problems. Make sure you know what disease or condition your medicine is treating, and how and when to take it. If you do not take your medicine correctly, it may not work well and may cause unpleasant side effects, including serious health problems. What should I do when I am taking medicines?   Read all the labels and inserts that come with your medicines. Review the information  often.  Talk with your pharmacist if you get a refill and notice a change in the size, color, or shape of your medicines.  Know the potential side effects for each medicine that you take.  Try to get all your medicines from the same pharmacy. The pharmacist will have all your information and will understand how  your medicines will affect each other (interact).  Tell your health care provider about all your medicines, including over-the-counter medicines, vitamins, and herbal or dietary supplements. He or she will make sure that nothing will interact with any of your prescribed medicines. How can I take my medicines safely?  Take medicines only as told by your health care provider. ? Do not take more of your medicine than instructed. ? Do not take anyone else's medicines. ? Do not share your medicines with others. ? Do not stop taking your medicines unless your health care provider tells you to do so. ? You may need to avoid alcohol or certain foods or liquids when taking certain medicines. Follow your health care provider's instructions.  Do not split, mash, or chew your medicines unless your health care provider tells you to do so. Tell your health care provider if you have trouble swallowing your medicines.  For liquid medicine, use the dosing container that was provided. How should I organize my medicines?  Know your medicines  Know what each of your medicines looks like. This includes size, color, and shape. Tell your health care provider if you are having trouble recognizing all the medicines that you are taking.  If you cannot tell your medicines apart because they look similar, keep them in original bottles.  If you cannot read the labels on the bottles, tell your pharmacist to put your medicines in containers with large print.  Review your medicines and your schedule with family members, a friend, or a caregiver. Use a pill organizer  Use a tool to organize your medicine schedule. Tools include a weekly pillbox, a written chart, a notebook, or a calendar.  Your tool should help you remember the following things about each medicine: ? The name of the medicine. ? The amount (dose) to take. ? The schedule. This is the day and time the medicine should be taken. ? The appearance. This  includes color, shape, size, and stamp. ? How to take your medicines. This includes instructions to take them with food, without food, with fluids, or with other medicines.  Create reminders for taking your medicines. Use sticky notes, or alarms on your watch, mobile device, or phone calendar.  You may choose to use a more advanced management system. These systems have storage, alarms, and visual and audio prompts.  Some medicines can be taken on an "as-needed" basis. These include medicines for nausea or pain. If you take an as-needed medicine, write down the name and dose, as well as the date and time that you took it. How should I plan for travel?  Take your pillbox, medicines, and organization system with you when traveling.  Have your medicines refilled before you travel. This will ensure that you do not run out of your medicines while you are away from home.  Always carry an updated list of your medicines with you. If there is an emergency, a first responder can quickly see what medicines you are taking.  Do not pack your medicines in checked luggage in case your luggage is lost or delayed.  If any of your medicines is considered a controlled substance,  make sure you bring a letter from your health care provider with you. How should I store and discard my medicines? For safe storage:  Store medicines in a cool, dry area away from light, or as directed by your health care provider. Do not store medicines in the bathroom. Heat and humidity will affect them.  Do not store your medicines with other chemicals, or with medicines for pets or other household members.  Keep medicines away from children and pets. Do not leave them on counters or bedside tables. Store them in high cabinets or on high shelves. For safe disposal:  Check expiration dates regularly. Do not take expired medicines. Discard medicines that are older than the expiration date.  Learn a safe way to dispose of your  medicines. You may: ? Use a local government, hospital, or pharmacy medicine-take-back program. ? Mix the medicines with inedible substances, put them in a sealed bag or empty container, and throw them in the trash. What should I remember?  Tell your health care provider if you: ? Experience side effects. ? Have new symptoms. ? Have other concerns about taking your medicines.  Review your medicines regularly with your health care provider. Other medicines, diet, medical conditions, weight changes, and daily habits can all affect how medicines work. Ask if you need to continue taking each medicine, and discuss how well each one is working.  Refill your medicines early to avoid running out of them.  In case of an accidental overdose, call your local [place] at [phone] or visit your local emergency department immediately. This is important. Summary  Taking your medicines correctly is an important part of managing or preventing medical problems.  You need to make sure that you understand what you are taking a medicine for, as well as how and when you need to take it.  Know your medicines and use a pill organizer to help you take your medicines correctly.  In case of an accidental overdose, call your local [place] at [phone] or visit your local emergency department immediately. This is important. This information is not intended to replace advice given to you by your health care provider. Make sure you discuss any questions you have with your health care provider. Document Revised: 05/31/2017 Document Reviewed: 05/31/2017 Elsevier Patient Education  2020 [employer].

## 2020-01-12 NOTE — Chronic Care Management (AMB) (Signed)
Great, I discussed with Nicole Downs today and she understood directions. She will contact us if any concerns.  Thanks!

## 2020-01-20 ENCOUNTER — Ambulatory Visit (INDEPENDENT_AMBULATORY_CARE_PROVIDER_SITE_OTHER): Payer: Medicare Other | Admitting: Podiatry

## 2020-01-20 ENCOUNTER — Encounter: Payer: Self-pay | Admitting: Podiatry

## 2020-01-20 ENCOUNTER — Other Ambulatory Visit: Payer: Self-pay

## 2020-01-20 DIAGNOSIS — B351 Tinea unguium: Secondary | ICD-10-CM | POA: Diagnosis not present

## 2020-01-20 DIAGNOSIS — R0989 Other specified symptoms and signs involving the circulatory and respiratory systems: Secondary | ICD-10-CM

## 2020-01-20 DIAGNOSIS — M79674 Pain in right toe(s): Secondary | ICD-10-CM | POA: Diagnosis not present

## 2020-01-20 DIAGNOSIS — M79675 Pain in left toe(s): Secondary | ICD-10-CM | POA: Diagnosis not present

## 2020-01-20 NOTE — Progress Notes (Signed)
  Subjective:  Patient ID: Nicole Downs, female    DOB: 09/21/1923,  MRN: [id number]  Chief Complaint  Patient presents with  . Nail Problem    pt is here for a nail trim   86 y.o. female returns for the above complaint.  She states that her toenails have been painful bilaterally.  She is here with her daughter today who has been taking care of her painful toenails.  She states that last time she attempted to cut her toenails and started cutting her skin which started bleeding.  She is unable to take care of these foot toenails as they have thickened elongated dystrophic.  She denies any other acute complaints.  She is at right ambulating in regular shoes.  Patient also has secondary complaint of xerotic skin.  Patient denies using any lotion to the lower extremity.  She would like to know if there is anything else that can be further done for this.  Objective:  There were no vitals filed for this visit. Podiatric Exam: Vascular: dorsalis pedis and posterior tibial pulses are non palpable bilateral. Capillary return is sluggish. Temperature gradient is WNL. Skin turgor WNL  Sensorium: Normal Semmes Weinstein monofilament test. Normal tactile sensation bilaterally. Nail Exam: Pt has thick disfigured discolored nails with subungual debris noted bilateral entire nail hallux through fifth toenails Ulcer Exam: There is no evidence of ulcer or pre-ulcerative changes or infection. Orthopedic Exam: Muscle tone and strength are WNL. No limitations in general ROM. No crepitus or effusions noted. HAV  B/L.  Hammer toes 2-5  B/L. Skin: No Porokeratosis. No infection or ulcers.  Dry skin noted bilateral lower extremity consistent with xerosis  Assessment & Plan:  Patient was evaluated and treated and all questions answered.  Xerosis bilateral lower extremity -I explained to the patient the etiology of xerosis and various treatment options were discussed with the patient.  I explained to the patient  that she will need to apply over-the-counter lotion for dry skin twice a day.  Patient states understanding and will try her best to apply lotion.  Onychomycosis with pain  -Nails palliatively debrided as below. -Educated on self-care  Procedure: Nail Debridement Rationale: pain  Type of Debridement: manual, sharp debridement. Instrumentation: Nail nipper, rotary burr. Number of Nails: 10  Procedures and Treatment: Consent by patient was obtained for treatment procedures. The patient understood the discussion of treatment and procedures well. All questions were answered thoroughly reviewed. Debridement of mycotic and hypertrophic toenails, 1 through 5 bilateral and clearing of subungual debris. No ulceration, no infection noted.  Return Visit-Office Procedure: Patient instructed to return to the office for a follow up visit 3 months for continued evaluation and treatment.  Boneta Lucks, DPM    No follow-ups on file.

## 2020-03-02 ENCOUNTER — Other Ambulatory Visit: Payer: Self-pay

## 2020-03-02 ENCOUNTER — Encounter: Payer: Self-pay | Admitting: Dermatology

## 2020-03-02 ENCOUNTER — Ambulatory Visit (INDEPENDENT_AMBULATORY_CARE_PROVIDER_SITE_OTHER): Payer: Medicare Other | Admitting: Dermatology

## 2020-03-02 DIAGNOSIS — L578 Other skin changes due to chronic exposure to nonionizing radiation: Secondary | ICD-10-CM | POA: Diagnosis not present

## 2020-03-02 DIAGNOSIS — L570 Actinic keratosis: Secondary | ICD-10-CM

## 2020-03-02 DIAGNOSIS — D485 Neoplasm of uncertain behavior of skin: Secondary | ICD-10-CM | POA: Diagnosis not present

## 2020-03-02 DIAGNOSIS — Z85828 Personal history of other malignant neoplasm of skin: Secondary | ICD-10-CM | POA: Diagnosis not present

## 2020-03-02 DIAGNOSIS — D0462 Carcinoma in situ of skin of left upper limb, including shoulder: Secondary | ICD-10-CM | POA: Diagnosis not present

## 2020-03-02 DIAGNOSIS — L821 Other seborrheic keratosis: Secondary | ICD-10-CM | POA: Diagnosis not present

## 2020-03-02 DIAGNOSIS — D492 Neoplasm of unspecified behavior of bone, soft tissue, and skin: Secondary | ICD-10-CM

## 2020-03-02 DIAGNOSIS — L820 Inflamed seborrheic keratosis: Secondary | ICD-10-CM

## 2020-03-02 NOTE — Patient Instructions (Signed)

## 2020-03-02 NOTE — Progress Notes (Signed)
Follow-Up Visit   Subjective  Nicole Downs is a 86 y.o. female who presents for the following: Follow-up (3 months f/u skin cancers removed from R mandible/earlobe, R neck superior, L neck inferior ) and Skin Problem (check arms pink scaly patches).  Daughter with pt and contributes to communication and history for patient.  The following portions of the chart were reviewed this encounter and updated as appropriate:  Tobacco   Allergies   Meds   Problems   Med Hx   Surg Hx   Fam Hx      Review of Systems:  No other skin or systemic complaints except as noted in HPI or Assessment and Plan.  Objective  Well appearing patient in no apparent distress; mood and affect are within normal limits.  A focused examination was performed including face,arms,legs . Relevant physical exam findings are noted in the Assessment and Plan.  Objective  at the junction of the R mandible and earlobe: Well healed scar with no evidence of recurrence.   Objective  R neck superior, R neck inferior: Well healed scar with no evidence of recurrence, no lymphadenopathy.   Objective  Left Upper Arm - proximal: 1.5 cm pink irregular patch   Objective  Left Upper Arm - distal: 0.7 cm pink irregular patch      Objective  arms (16): Erythematous keratotic or waxy stuck-on papule or plaque.   Objective  L hand, face (22): Erythematous thin papules/macules with gritty scale.    Assessment & Plan  History of basal cell carcinoma (BCC) at the junction of the R mandible and earlobe Clear. Observe for recurrence. Call clinic for new or changing lesions.  Recommend regular skin exams, daily broad-spectrum spf 30+ sunscreen use, and photoprotection.     History of SCC (squamous cell carcinoma) of skin R neck superior, R neck inferior Clear. Observe for recurrence. Call clinic for new or changing lesions.  Recommend regular skin exams, daily broad-spectrum spf 30+ sunscreen use, and photoprotection.      Neoplasm of skin (2) Left Upper Arm - proximal  Skin / nail biopsy Type of biopsy: tangential   Informed consent: discussed and consent obtained   Patient was prepped and draped in usual sterile fashion: area prepped with alochol. Anesthesia: the lesion was anesthetized in a standard fashion   Anesthetic:  1% lidocaine w/ epinephrine 1-100,000 buffered w/ 8.4% NaHCO3 Instrument used: flexible razor blade   Hemostasis achieved with: pressure, aluminum chloride and electrodesiccation   Outcome: patient tolerated procedure well   Post-procedure details: wound care instructions given   Post-procedure details comment:  Ointment and small bandage  Specimen 1 - Surgical pathology Differential Diagnosis: R/o BCC  Check Margins: No 1.5 cm pink irregular patch  Left Upper Arm - distal  Skin / nail biopsy Type of biopsy: tangential   Informed consent: discussed and consent obtained   Patient was prepped and draped in usual sterile fashion: area prepped with alochol. Anesthesia: the lesion was anesthetized in a standard fashion   Anesthetic:  1% lidocaine w/ epinephrine 1-100,000 buffered w/ 8.4% NaHCO3 Instrument used: flexible razor blade   Hemostasis achieved with: pressure, aluminum chloride and electrodesiccation   Outcome: patient tolerated procedure well   Post-procedure details: wound care instructions given   Post-procedure details comment:  Ointment and small bandage  Specimen 2 - Surgical pathology Differential Diagnosis: R/O BCC  Check Margins: No 0.7 cm pink irregular patch  Inflamed seborrheic keratosis (16) arms  Destruction of lesion - arms  Complexity: simple   Destruction method: cryotherapy   Informed consent: discussed and consent obtained   Timeout:  patient name, date of birth, surgical site, and procedure verified Lesion destroyed using liquid nitrogen: Yes   Region frozen until ice ball extended beyond lesion: Yes   Outcome: patient tolerated  procedure well with no complications   Post-procedure details: wound care instructions given    AK (actinic keratosis) (22) L hand, face  Destruction of lesion - L hand, face Complexity: simple   Destruction method: cryotherapy   Informed consent: discussed and consent obtained   Timeout:  patient name, date of birth, surgical site, and procedure verified Lesion destroyed using liquid nitrogen: Yes   Region frozen until ice ball extended beyond lesion: Yes   Outcome: patient tolerated procedure well with no complications   Post-procedure details: wound care instructions given     Actinic Damage - diffuse scaly erythematous macules with underlying dyspigmentation - Recommend daily broad spectrum sunscreen SPF 30+ to sun-exposed areas, reapply every 2 hours as needed.  - Call for new or changing lesions.  Seborrheic Keratoses - Stuck-on, waxy, tan-brown papules and plaques  - Discussed benign etiology and prognosis. - Observe - Call for any changes  Return in about 3 months (around 06/01/2020).  IMarye Round, CMA, am acting as scribe for Sarina Ser, MD .  Documentation: I have reviewed the above documentation for accuracy and completeness, and I agree with the above.  Sarina Ser, MD

## 2020-03-08 ENCOUNTER — Telehealth: Payer: Self-pay

## 2020-03-08 NOTE — Telephone Encounter (Signed)
-----   Message from Ralene Bathe, MD sent at 03/08/2020 12:54 PM EDT ----- 1. Skin , left upper arm, proximal SQUAMOUS CELL CARCINOMA IN SITU 2. Skin , left upper arm, distal SQUAMOUS CELL CARCINOMA IN SITU  1& 2 - both SCC in situ Superficial Schedule for treatment (EDC)

## 2020-03-08 NOTE — Telephone Encounter (Signed)
Discussed biopsy results with pt daughter, she will schedule appt for [facility]

## 2020-03-18 DIAGNOSIS — Z23 Encounter for immunization: Secondary | ICD-10-CM | POA: Diagnosis not present

## 2020-03-22 ENCOUNTER — Other Ambulatory Visit: Payer: Self-pay | Admitting: Primary Care

## 2020-04-05 ENCOUNTER — Other Ambulatory Visit: Payer: Self-pay | Admitting: Primary Care

## 2020-04-16 ENCOUNTER — Ambulatory Visit (INDEPENDENT_AMBULATORY_CARE_PROVIDER_SITE_OTHER): Payer: Medicare Other

## 2020-04-16 ENCOUNTER — Other Ambulatory Visit: Payer: Self-pay

## 2020-04-16 DIAGNOSIS — Z0000 Encounter for general adult medical examination without abnormal findings: Secondary | ICD-10-CM

## 2020-04-16 NOTE — Progress Notes (Signed)
PCP notes:  Health Maintenance: Flu- due Dexa- decline   Abnormal Screenings: none   Patient concerns: none   Nurse concerns: none   Next PCP appt.: 04/20/2020 @ 8:20 am

## 2020-04-16 NOTE — Patient Instructions (Signed)
Nicole Downs , Thank you for taking time to come for your Medicare Wellness Visit. I appreciate your ongoing commitment to your health goals. Please review the following plan we discussed and let me know if I can assist you in the future.   Screening recommendations/referrals: Colonoscopy: no longer required Mammogram: no longer required Bone Density: decline Recommended yearly ophthalmology/optometry visit for glaucoma screening and checkup Recommended yearly dental visit for hygiene and checkup  Vaccinations: Influenza vaccine: due, will get at upcoming physical Pneumococcal vaccine: Completed series Tdap vaccine: Up to date, completed 04/16/2019, due 03/2029 Shingles vaccine: due, check with your insurance regarding coverage if interested   Covid-19:Completed series  Advanced directives: Advance directive discussed with you today. Even though you declined this today please call our office should you change your mind and we can give you the proper paperwork for you to fill out.  Conditions/risks identified: hypertension  Next appointment: Follow up in one year for your annual wellness visit    Preventive Care 11 Years and Older, Female Preventive care refers to lifestyle choices and visits with your health care provider that can promote health and wellness. What does preventive care include?  A yearly physical exam. This is also called an annual well check.  Dental exams once or twice a year.  Routine eye exams. Ask your health care provider how often you should have your eyes checked.  Personal lifestyle choices, including:  Daily care of your teeth and gums.  Regular physical activity.  Eating a healthy diet.  Avoiding tobacco and drug use.  Limiting alcohol use.  Practicing safe sex.  Taking low-dose aspirin every day.  Taking vitamin and mineral supplements as recommended by your health care provider. What happens during an annual well check? The services and  screenings done by your health care provider during your annual well check will depend on your age, overall health, lifestyle risk factors, and family history of disease. Counseling  Your health care provider may ask you questions about your:  Alcohol use.  Tobacco use.  Drug use.  Emotional well-being.  Home and relationship well-being.  Sexual activity.  Eating habits.  History of falls.  Memory and ability to understand (cognition).  Work and work Statistician.  Reproductive health. Screening  You may have the following tests or measurements:  Height, weight, and BMI.  Blood pressure.  Lipid and cholesterol levels. These may be checked every 5 years, or more frequently if you are over 68 years old.  Skin check.  Lung cancer screening. You may have this screening every year starting at age 62 if you have a 30-pack-year history of smoking and currently smoke or have quit within the past 15 years.  Fecal occult blood test (FOBT) of the stool. You may have this test every year starting at age 22.  Flexible sigmoidoscopy or colonoscopy. You may have a sigmoidoscopy every 5 years or a colonoscopy every 10 years starting at age 54.  Hepatitis C blood test.  Hepatitis B blood test.  Sexually transmitted disease (STD) testing.  Diabetes screening. This is done by checking your blood sugar (glucose) after you have not eaten for a while (fasting). You may have this done every 1-3 years.  Bone density scan. This is done to screen for osteoporosis. You may have this done starting at age 29.  Mammogram. This may be done every 1-2 years. Talk to your health care provider about how often you should have regular mammograms. Talk with your health care provider  about your test results, treatment options, and if necessary, the need for more tests. Vaccines  Your health care provider may recommend certain vaccines, such as:  Influenza vaccine. This is recommended every  year.  Tetanus, diphtheria, and acellular pertussis (Tdap, Td) vaccine. You may need a Td booster every 10 years.  Zoster vaccine. You may need this after age 58.  Pneumococcal 13-valent conjugate (PCV13) vaccine. One dose is recommended after age 75.  Pneumococcal polysaccharide (PPSV23) vaccine. One dose is recommended after age 67. Talk to your health care provider about which screenings and vaccines you need and how often you need them. This information is not intended to replace advice given to you by your health care provider. Make sure you discuss any questions you have with your health care provider. Document Released: 07/02/2015 Document Revised: 02/23/2016 Document Reviewed: 04/06/2015 Elsevier Interactive Patient Education  2017 [employer] Prevention in the Home Falls can cause injuries. They can happen to people of all ages. There are many things you can do to make your home safe and to help prevent falls. What can I do on the outside of my home?  Regularly fix the edges of walkways and driveways and fix any cracks.  Remove anything that might make you trip as you walk through a door, such as a raised step or threshold.  Trim any bushes or trees on the path to your home.  Use bright outdoor lighting.  Clear any walking paths of anything that might make someone trip, such as rocks or tools.  Regularly check to see if handrails are loose or broken. Make sure that both sides of any steps have handrails.  Any raised decks and porches should have guardrails on the edges.  Have any leaves, snow, or ice cleared regularly.  Use sand or salt on walking paths during winter.  Clean up any spills in your garage right away. This includes oil or grease spills. What can I do in the bathroom?  Use night lights.  Install grab bars by the toilet and in the tub and shower. Do not use towel bars as grab bars.  Use non-skid mats or decals in the tub or shower.  If you  need to sit down in the shower, use a plastic, non-slip stool.  Keep the floor dry. Clean up any water that spills on the floor as soon as it happens.  Remove soap buildup in the tub or shower regularly.  Attach bath mats securely with double-sided non-slip rug tape.  Do not have throw rugs and other things on the floor that can make you trip. What can I do in the bedroom?  Use night lights.  Make sure that you have a light by your bed that is easy to reach.  Do not use any sheets or blankets that are too big for your bed. They should not hang down onto the floor.  Have a firm chair that has side arms. You can use this for support while you get dressed.  Do not have throw rugs and other things on the floor that can make you trip. What can I do in the kitchen?  Clean up any spills right away.  Avoid walking on wet floors.  Keep items that you use a lot in easy-to-reach places.  If you need to reach something above you, use a strong step stool that has a grab bar.  Keep electrical cords out of the way.  Do not use floor polish or  wax that makes floors slippery. If you must use wax, use non-skid floor wax.  Do not have throw rugs and other things on the floor that can make you trip. What can I do with my stairs?  Do not leave any items on the stairs.  Make sure that there are handrails on both sides of the stairs and use them. Fix handrails that are broken or loose. Make sure that handrails are as long as the stairways.  Check any carpeting to make sure that it is firmly attached to the stairs. Fix any carpet that is loose or worn.  Avoid having throw rugs at the top or bottom of the stairs. If you do have throw rugs, attach them to the floor with carpet tape.  Make sure that you have a light switch at the top of the stairs and the bottom of the stairs. If you do not have them, ask someone to add them for you. What else can I do to help prevent falls?  Wear shoes  that:  Do not have high heels.  Have rubber bottoms.  Are comfortable and fit you well.  Are closed at the toe. Do not wear sandals.  If you use a stepladder:  Make sure that it is fully opened. Do not climb a closed stepladder.  Make sure that both sides of the stepladder are locked into place.  Ask someone to hold it for you, if possible.  Clearly mark and make sure that you can see:  Any grab bars or handrails.  First and last steps.  Where the edge of each step is.  Use tools that help you move around (mobility aids) if they are needed. These include:  Canes.  Walkers.  Scooters.  Crutches.  Turn on the lights when you go into a dark area. Replace any light bulbs as soon as they burn out.  Set up your furniture so you have a clear path. Avoid moving your furniture around.  If any of your floors are uneven, fix them.  If there are any pets around you, be aware of where they are.  Review your medicines with your doctor. Some medicines can make you feel dizzy. This can increase your chance of falling. Ask your doctor what other things that you can do to help prevent falls. This information is not intended to replace advice given to you by your health care provider. Make sure you discuss any questions you have with your health care provider. Document Released: 04/01/2009 Document Revised: 11/11/2015 Document Reviewed: 07/10/2014 Elsevier Interactive Patient Education  2017 [employer].

## 2020-04-16 NOTE — Progress Notes (Signed)
Subjective:   Nicole Downs is a 86 y.o. female who presents for Medicare Annual (Subsequent) preventive examination.  Review of Systems: N/A      I connected with the patient today by telephone and verified that I am speaking with the correct person using two identifiers. Location patient: home Location nurse: work Persons participating in the telephone visit: patient, nurse.   I discussed the limitations, risks, security and privacy concerns of performing an evaluation and management service by telephone and the availability of in person appointments. I also discussed with the patient that there may be a patient responsible charge related to this service. The patient expressed understanding and verbally consented to this telephonic visit.        Cardiac Risk Factors include: advanced age (>58men, >16 women);hypertension     Objective:    Today's Vitals   There is no height or weight on file to calculate BMI.  Advanced Directives 04/16/2020 04/15/2019 05/01/2018 04/10/2018  Does Patient Have a Medical Advance Directive? No Yes Yes Yes  Type of Advance Directive - [place];Living will Healthcare Power of [place] in Chart? - No - copy requested No - copy requested No - copy requested  Would patient like information on creating a medical advance directive? No - Patient declined - - -    Current Medications (verified) Outpatient Encounter Medications as of 04/16/2020  Medication Sig  . BETIMOL 0.5 % ophthalmic solution Apply 1 drop to eye 2 (two) times daily.   . Calcium Carbonate (CALCIUM 600 PO) Take 600 mg by mouth 2 (two) times daily.  . Cholecalciferol (VITAMIN D3) 10000 units TABS Take 1,000 mg by mouth 2 (two) times daily.  Marland Kitchen donepezil (ARICEPT) 5 MG tablet TAKE 1 TABLET BY MOUTH EVERYDAY AT BEDTIME  . IRON PO Take 65 mg by mouth daily with lunch.  . levobunolol (BETAGAN) 0.5 %  ophthalmic solution Place 1 drop into both eyes 2 (two) times daily.  Marland Kitchen levothyroxine (SYNTHROID) 25 MCG tablet TAKE 1 TABLET BY MOUTH DAILY WITHOUT FOOD, WATER ONLY, DO NOT EAT WITHIN 30 MINUTES AFTER TAKING  . losartan (COZAAR) 25 MG tablet TAKE 1 TABLET BY MOUTH EVERY DAY FOR BLOOD PRESSURE  . Melatonin-Pyridoxine (MELATIN PO) Take 1 tablet by mouth at bedtime as needed (sleep).  . propranolol (INDERAL) 80 MG tablet TAKE 2 TABLETS BY MOUTH TWICE A DAY FOR TREMOR  . sertraline (ZOLOFT) 25 MG tablet TAKE 1 TABLET BY MOUTH AT BEDTIME FOR DEPRESSION.   No facility-administered encounter medications on file as of 04/16/2020.    Allergies (verified) Augmentin [amoxicillin-pot clavulanate] and Amlodipine   History: Past Medical History:  Diagnosis Date  . Actinic keratosis   . Acute confusion   . Arthritis   . Basal cell carcinoma 11/01/2015   L lat ankle  . Basal cell carcinoma 12/13/2015   L dorsum foot  . Basal cell carcinoma 09/12/2017   nasal root, R nasal bridge, L nasal bridge, R mandible infra auricular  . Basal cell carcinoma 10/24/2017   R lateral foot  . Basal cell carcinoma 12/05/2017   L dorsum foot  . Basal cell carcinoma 11/27/2018   R of midline mid forehead  . Basal cell carcinoma 06/25/2019   nasal bridge  . Basal cell carcinoma 12/01/2019   Junction of right mandible and earlobe. Nodular pattern.  . Emphysema lung ([place])   . Glaucoma   . Hypertension   .  Hyponatremia   . Hypothyroidism   . Memory loss   . Poor peripheral circulation   . Squamous cell carcinoma of skin 09/20/2015   R prox mandible/in situ  . Squamous cell carcinoma of skin 02/01/2016   nose  . Squamous cell carcinoma of skin 10/24/2017   R lateral knee/in situ, R lat ankle/in situ  . Squamous cell carcinoma of skin 12/05/2017   L medial calf/in situ  . Squamous cell carcinoma of skin 12/01/2019   Right neck sup. SCCis, hypertrophic  . Squamous cell carcinoma of skin 12/01/2019   Right  neck inf.  SCCis, hypertrophic  . Tremors of nervous system    Past Surgical History:  Procedure Laterality Date  . BREAST BIOPSY     Family History  Problem Relation Age of Onset  . Heart attack Mother   . Other Father        unsure - she was only 63 years old when he died   Social History   Socioeconomic History  . Marital status: Single    Spouse name: Not on file  . Number of children: 1  . Years of education: 11th grade  . Highest education level: Not on file  Occupational History  . Occupation: Retired  Tobacco Use  . Smoking status: Never Smoker  . Smokeless tobacco: Never Used  Vaping Use  . Vaping Use: Never used  Substance and Sexual Activity  . Alcohol use: No    Alcohol/week: 0.0 standard drinks  . Drug use: No  . Sexual activity: Not Currently  Other Topics Concern  . Not on file  Social History Narrative   Lives with daughter and son-in-law.   Right-handed.   Drinks one soda per day.   Social Determinants of Health   Financial Resource Strain: Low Risk   . Difficulty of Paying Living Expenses: Not hard at all  Food Insecurity: No Food Insecurity  . Worried About Charity fundraiser in the Last Year: Never true  . Ran Out of Food in the Last Year: Never true  Transportation Needs: No Transportation Needs  . Lack of Transportation (Medical): No  . Lack of Transportation (Non-Medical): No  Physical Activity: Inactive  . Days of Exercise per Week: 0 days  . Minutes of Exercise per Session: 0 min  Stress: No Stress Concern Present  . Feeling of Stress : Not at all  Social Connections:   . Frequency of Communication with Friends and Family: Not on file  . Frequency of Social Gatherings with Friends and Family: Not on file  . Attends Religious Services: Not on file  . Active Member of Clubs or Organizations: Not on file  . Attends Archivist Meetings: Not on file  . Marital Status: Not on file    Tobacco Counseling Counseling given: Not  Answered   Clinical Intake:  Pre-visit preparation completed: Yes  Pain : No/denies pain     Nutritional Risks: None Diabetes: No  How often do you need to have someone help you when you read instructions, pamphlets, or other written materials from your doctor or pharmacy?: 1 - Never What is the last grade level you completed in school?: 11th  Diabetic: No Nutrition Risk Assessment:  Has the patient had any N/V/D within the last 2 months?  No  Does the patient have any non-healing wounds?  No  Has the patient had any unintentional weight loss or weight gain?  No   Diabetes:  Is the patient diabetic?  No  If diabetic, was a CBG obtained today?  N/A Did the patient bring in their glucometer from home?  N/A How often do you monitor your CBG's? N/A.   Financial Strains and Diabetes Management:  Are you having any financial strains with the device, your supplies or your medication? N/A.  Does the patient want to be seen by Chronic Care Management for management of their diabetes?  N/A Would the patient like to be referred to a Nutritionist or for Diabetic Management?  N/A   Interpreter Needed?: No  Information entered by :: CJohnson, LPN   Activities of Daily Living In your present state of health, do you have any difficulty performing the following activities: 04/16/2020  Hearing? Y  Comment will not wear hearing aids  Vision? N  Difficulty concentrating or making decisions? Y  Comment memory loss noted  Walking or climbing stairs? Y  Dressing or bathing? N  Doing errands, shopping? Y  Preparing Food and eating ? Y  Using the Toilet? N  In the past six months, have you accidently leaked urine? Y  Comment wears pads  Do you have problems with loss of bowel control? N  Managing your Medications? Y  Managing your Finances? Y  Housekeeping or managing your Housekeeping? Y  Some recent data might be hidden    Patient Care Team: Pleas Koch, NP as PCP -  General (Internal Medicine) Debbora Dus, [facility] as Pharmacist (Pharmacist)  Indicate any recent Medical Services you may have received from other than [facility] providers in the past year (date may be approximate).     Assessment:   This is a routine wellness examination for [place].  Hearing/Vision screen  Hearing Screening   125Hz  250Hz  500Hz  1000Hz  2000Hz  3000Hz  4000Hz  6000Hz  8000Hz   Right ear:           Left ear:           Vision Screening Comments: Patient gets annual eye exams   Dietary issues and exercise activities discussed: Current Exercise Habits: The patient does not participate in regular exercise at present, Exercise limited by: None identified  Goals    . Patient Stated     Starting 04/10/2018, I will continue to take medications as prescribed.     . Patient Stated     04/15/2019, Patient will maintain and continue medications as prescribed.     . Patient Stated     04/16/2020, I will maintain and continue medications as prescribed.     . Pharmacy Care Plan     CARE PLAN ENTRY (see longitudinal plan of care for additional care plan information)  Current Barriers:  . Chronic Disease Management support, education, and care coordination needs related to Hypertension and Anemia   Hypertension BP Readings from Last 3 Encounters:  05/13/19 (!) 158/80  04/16/19 128/80  06/07/18 130/70 .  Pharmacist Clinical Goal(s): o Over the next 6 months, patient will work with PharmD and providers to maintain BP goal <140/90 and prevent hypotension < 90/60 mmHg . Current regimen:  o Losartan 25 mg - 1 tablet daily . Interventions: o Comprehensive medication review for safety . Patient self care activities - Over the next 6 months, patient will: o Check BP once a month, document, and provide at future appointments o Call if any readings < 90/60 or falls  Anemia . Pharmacist Clinical Goal(s) o Over the next 6 months, patient will work with PharmD and providers to reduce  unnecessary medications . Current regimen:  Iron 65 mg - 1 tablet daily at lunch . Interventions: o Consult PCP to review iron labs  . Patient self care activities - Over the next 6 months, patient will: o Discontinue iron for now o Repeat labs at annual visit in October o Please call if you notice any concerns/changes in symptoms   Initial goal documentation      Depression Screen PHQ 2/9 Scores 04/16/2020 04/15/2019 04/10/2018 04/10/2018  PHQ - 2 Score 0 0 4 0  PHQ- 9 Score 0 0 10 0    Fall Risk Fall Risk  04/16/2020 04/15/2019 04/10/2018 10/17/2017  Falls in the past year? 1 0 No No  Comment fell when getting out of chair - - -  Number falls in past yr: 0 0 - -  Injury with Fall? 0 0 - -  Risk for fall due to : Medication side effect;Impaired balance/gait;Impaired mobility Medication side effect;Impaired balance/gait - -  Follow up Falls evaluation completed;Falls prevention discussed Falls evaluation completed;Falls prevention discussed - -    Any stairs in or around the home? Yes  If so, are there any without handrails? No  Home free of loose throw rugs in walkways, pet beds, electrical cords, etc? Yes  Adequate lighting in your home to reduce risk of falls? Yes   ASSISTIVE DEVICES UTILIZED TO PREVENT FALLS:  Life alert? No  Use of a cane, walker or w/c? Yes  Grab bars in the bathroom? Yes  Shower chair or bench in shower? Yes  Elevated toilet seat or a handicapped toilet? No   TIMED UP AND GO:  Was the test performed? N/A, telephonic visit .   Cognitive Function: MMSE - Mini Mental State Exam 04/16/2020 04/15/2019 04/10/2018  Not completed: Unable to complete Unable to complete (No Data)  Mini Cog  Mini-Cog screen was not completed. Maximum score is 22. A value of 0 denotes this part of the MMSE was not completed or the patient failed this part of the Mini-Cog screening.       Immunizations Immunization History  Administered Date(s) Administered  .  Fluad Quad(high Dose 65+) 04/16/2019  . Influenza,inj,Quad PF,6+ Mos 04/10/2016, 03/29/2017, 04/10/2018  . PFIZER SARS-COV-2 Vaccination 07/09/2019, 07/30/2019, 03/18/2020  . Pneumococcal Conjugate-13 03/19/2014  . Pneumococcal Polysaccharide-23 03/20/2015  . Tdap 06/19/2008, 04/16/2019    TDAP status: Up to date Flu Vaccine status: due, will get at upcoming physical  Pneumococcal vaccine status: Up to date Covid-19 vaccine status: Completed vaccines  Qualifies for Shingles Vaccine? Yes   Zostavax completed No   Shingrix Completed?: No.    Education has been provided regarding the importance of this vaccine. Patient has been advised to call insurance company to determine out of pocket expense if they have not yet received this vaccine. Advised may also receive vaccine at local pharmacy or Health Dept. Verbalized acceptance and understanding.  Screening Tests Health Maintenance  Topic Date Due  . INFLUENZA VACCINE  01/18/2020  . TETANUS/TDAP  04/15/2029  . DEXA SCAN  Completed  . COVID-19 Vaccine  Completed  . PNA vac Low Risk Adult  Completed    Health Maintenance  Health Maintenance Due  Topic Date Due  . INFLUENZA VACCINE  01/18/2020    Colorectal cancer screening: No longer required.  Mammogram status: No longer required.  Bone Density status: declined  Lung Cancer Screening: (Low Dose CT Chest recommended if Age 79-80 years, 30 pack-year currently smoking OR have quit w/in 15years.) does not qualify.    Additional Screening:  Hepatitis C Screening: does not qualify; Completed N/A  Vision Screening: Recommended annual ophthalmology exams for early detection of glaucoma and other disorders of the eye. Is the patient up to date with their annual eye exam?  Yes  Who is the provider or what is the name of the office in which the patient attends annual eye exams? [facility] If pt is not established with a provider, would they like to be referred to a provider to  establish care? No .   Dental Screening: Recommended annual dental exams for proper oral hygiene  Community Resource Referral / Chronic Care Management: CRR required this visit?  No   CCM required this visit?  No      Plan:     I have personally reviewed and noted the following in the patient's chart:   . Medical and social history . Use of alcohol, tobacco or illicit drugs  . Current medications and supplements . Functional ability and status . Nutritional status . Physical activity . Advanced directives . List of other physicians . Hospitalizations, surgeries, and ER visits in previous 12 months . Vitals . Screenings to include cognitive, depression, and falls . Referrals and appointments  In addition, I have reviewed and discussed with patient certain preventive protocols, quality metrics, and best practice recommendations. A written personalized care plan for preventive services as well as general preventive health recommendations were provided to patient.   Due to this being a telephonic visit, the after visit summary with patients personalized plan was offered to patient via office or my-chart. Patient preferred to pick up at office at next visit or via mychart.   Andrez Grime, LPN   [id number]

## 2020-04-20 ENCOUNTER — Other Ambulatory Visit: Payer: Self-pay

## 2020-04-20 ENCOUNTER — Ambulatory Visit (INDEPENDENT_AMBULATORY_CARE_PROVIDER_SITE_OTHER): Payer: Medicare Other | Admitting: Primary Care

## 2020-04-20 ENCOUNTER — Encounter: Payer: Self-pay | Admitting: Primary Care

## 2020-04-20 VITALS — BP 149/74 | HR 88 | Temp 97.6°F | Ht 61.0 in | Wt 122.0 lb

## 2020-04-20 DIAGNOSIS — G250 Essential tremor: Secondary | ICD-10-CM | POA: Diagnosis not present

## 2020-04-20 DIAGNOSIS — R0989 Other specified symptoms and signs involving the circulatory and respiratory systems: Secondary | ICD-10-CM

## 2020-04-20 DIAGNOSIS — F0390 Unspecified dementia without behavioral disturbance: Secondary | ICD-10-CM

## 2020-04-20 DIAGNOSIS — H409 Unspecified glaucoma: Secondary | ICD-10-CM | POA: Diagnosis not present

## 2020-04-20 DIAGNOSIS — Z23 Encounter for immunization: Secondary | ICD-10-CM | POA: Diagnosis not present

## 2020-04-20 DIAGNOSIS — I10 Essential (primary) hypertension: Secondary | ICD-10-CM | POA: Diagnosis not present

## 2020-04-20 DIAGNOSIS — J439 Emphysema, unspecified: Secondary | ICD-10-CM

## 2020-04-20 DIAGNOSIS — E039 Hypothyroidism, unspecified: Secondary | ICD-10-CM

## 2020-04-20 DIAGNOSIS — R600 Localized edema: Secondary | ICD-10-CM | POA: Diagnosis not present

## 2020-04-20 DIAGNOSIS — F331 Major depressive disorder, recurrent, moderate: Secondary | ICD-10-CM | POA: Diagnosis not present

## 2020-04-20 NOTE — Assessment & Plan Note (Signed)
Denies exertional dyspnea, lungs clear on exam today.

## 2020-04-20 NOTE — Addendum Note (Signed)
Addended by: Ellamae Sia on: 04/20/2020 04:33 PM   Modules accepted: Orders

## 2020-04-20 NOTE — Assessment & Plan Note (Signed)
Borderline high in the office today. Daughter will monitor home readings. Continue losartan 25 mg.  CMP pending.

## 2020-04-20 NOTE — Assessment & Plan Note (Signed)
None noted on exam today. Encouraged to increase activity during the day.

## 2020-04-20 NOTE — Progress Notes (Signed)
Subjective:    Patient ID: Nicole Downs, female    DOB: 08-Mar-1924, 86 y.o.   MRN: [id number]  HPI  This visit occurred during the SARS-CoV-2 public health emergency.  Safety protocols were in place, including screening questions prior to the visit, additional usage of staff PPE, and extensive cleaning of exam room while observing appropriate contact time as indicated for disinfecting solutions.   Nicole Downs is a 86 year old female who presents today for follow up of chronic conditions.  She is compliant to all medications.   She is not checking her blood pressure at home. She denies chest pain, dizziness, headaches.  Her tremor continues. She is compliant to her propranolol 160 mg twice daily.   She leads a very sedentary life, mostly sits in the chair, little activity.   She doesn't wear her hearing aid despite her daughter's prompts.   Confusion continues, slowly progressing. Her daughter feels that she's doing well on Zoloft for depression.   Wt Readings from Last 3 Encounters:  04/20/20 122 lb (55.3 kg)  04/16/19 127 lb (57.6 kg)  06/07/18 123 lb (55.8 kg)     BP Readings from Last 3 Encounters:  04/20/20 (!) 149/74  05/13/19 (!) 158/80  04/16/19 128/80     Review of Systems  Constitutional: Negative for appetite change and unexpected weight change.  Eyes: Negative for visual disturbance.  Respiratory: Negative for shortness of breath.   Cardiovascular: Negative for chest pain.  Gastrointestinal: Negative for abdominal pain.  Neurological: Negative for dizziness and headaches.  Psychiatric/Behavioral: The patient is not nervous/anxious.        Past Medical History:  Diagnosis Date   Actinic keratosis    Acute confusion    Arthritis    Basal cell carcinoma 11/01/2015   L lat ankle   Basal cell carcinoma 12/13/2015   L dorsum foot   Basal cell carcinoma 09/12/2017   nasal root, R nasal bridge, L nasal bridge, R mandible infra auricular    Basal cell carcinoma 10/24/2017   R lateral foot   Basal cell carcinoma 12/05/2017   L dorsum foot   Basal cell carcinoma 11/27/2018   R of midline mid forehead   Basal cell carcinoma 06/25/2019   nasal bridge   Basal cell carcinoma 12/01/2019   Junction of right mandible and earlobe. Nodular pattern.   Emphysema lung (HCC)    Glaucoma    Hypertension    Hyponatremia    Hypothyroidism    Memory loss    Poor peripheral circulation    Squamous cell carcinoma of skin 09/20/2015   R prox mandible/in situ   Squamous cell carcinoma of skin 02/01/2016   nose   Squamous cell carcinoma of skin 10/24/2017   R lateral knee/in situ, R lat ankle/in situ   Squamous cell carcinoma of skin 12/05/2017   L medial calf/in situ   Squamous cell carcinoma of skin 12/01/2019   Right neck sup. SCCis, hypertrophic   Squamous cell carcinoma of skin 12/01/2019   Right neck inf.  SCCis, hypertrophic   Tremors of nervous system      Social History   Socioeconomic History   Marital status: Single    Spouse name: Not on file   Number of children: 1   Years of education: 11th grade   Highest education level: Not on file  Occupational History   Occupation: Retired  Tobacco Use   Smoking status: Never Smoker   Smokeless tobacco: Never Used  Media planner  Vaping Use: Never used  Substance and Sexual Activity   Alcohol use: No    Alcohol/week: 0.0 standard drinks   Drug use: No   Sexual activity: Not Currently  Other Topics Concern   Not on file  Social History Narrative   Lives with daughter and son-in-law.   Right-handed.   Drinks one soda per day.   Social Determinants of Health   Financial Resource Strain: Low Risk    Difficulty of Paying Living Expenses: Not hard at all  Food Insecurity: No Food Insecurity   Worried About Charity fundraiser in the Last Year: Never true   [place] in the Last Year: Never true  Transportation Needs: No  Transportation Needs   Lack of Transportation (Medical): No   Lack of Transportation (Non-Medical): No  Physical Activity: Inactive   Days of Exercise per Week: 0 days   Minutes of Exercise per Session: 0 min  Stress: No Stress Concern Present   Feeling of Stress : Not at all  Social Connections:    Frequency of Communication with Friends and Family: Not on file   Frequency of Social Gatherings with Friends and Family: Not on file   Attends Religious Services: Not on Electrical engineer or Organizations: Not on file   Attends Archivist Meetings: Not on file   Marital Status: Not on file  Intimate Partner Violence: Not At Risk   Fear of Current or Ex-Partner: No   Emotionally Abused: No   Physically Abused: No   Sexually Abused: No    Past Surgical History:  Procedure Laterality Date   BREAST BIOPSY      Family History  Problem Relation Age of Onset   Heart attack Mother    Other Father        unsure - she was only 5 years old when he died    Allergies  Allergen Reactions   Augmentin [Amoxicillin-Pot Clavulanate] Rash   Amlodipine Other (See Comments)    Ankle edema    Current Outpatient Medications on File Prior to Visit  Medication Sig Dispense Refill   BETIMOL 0.5 % ophthalmic solution Apply 1 drop to eye 2 (two) times daily.      Calcium Carbonate (CALCIUM 600 PO) Take 600 mg by mouth 2 (two) times daily.     Cholecalciferol (VITAMIN D3) 10000 units TABS Take 1,000 mg by mouth 2 (two) times daily.     donepezil (ARICEPT) 5 MG tablet TAKE 1 TABLET BY MOUTH EVERYDAY AT BEDTIME 90 tablet 0   levobunolol (BETAGAN) 0.5 % ophthalmic solution Place 1 drop into both eyes 2 (two) times daily.     levothyroxine (SYNTHROID) 25 MCG tablet TAKE 1 TABLET BY MOUTH DAILY WITHOUT FOOD, WATER ONLY, DO NOT EAT WITHIN 30 MINUTES AFTER TAKING 90 tablet 2   losartan (COZAAR) 25 MG tablet TAKE 1 TABLET BY MOUTH EVERY DAY FOR BLOOD PRESSURE  90 tablet 1   Melatonin-Pyridoxine (MELATIN PO) Take 1 tablet by mouth at bedtime as needed (sleep).     propranolol (INDERAL) 80 MG tablet TAKE 2 TABLETS BY MOUTH TWICE A DAY FOR TREMOR 360 tablet 1   sertraline (ZOLOFT) 25 MG tablet TAKE 1 TABLET BY MOUTH AT BEDTIME FOR DEPRESSION. 90 tablet 1   No current facility-administered medications on file prior to visit.    BP (!) 149/74    Pulse 88    Temp 97.6 F (36.4 C) (Temporal)  Ht 5\' 1"  (1.549 m)    Wt 122 lb (55.3 kg)    BMI 23.05 kg/m    Objective:   Physical Exam Cardiovascular:     Rate and Rhythm: Normal rate and regular rhythm.  Pulmonary:     Effort: Pulmonary effort is normal.     Breath sounds: Normal breath sounds.  Musculoskeletal:     Cervical back: Neck supple.  Skin:    General: Skin is warm and dry.            Assessment & Plan:

## 2020-04-20 NOTE — Assessment & Plan Note (Signed)
No edema noted today. Chronic. Encouraged elevation of legs and to increase activity.

## 2020-04-20 NOTE — Patient Instructions (Addendum)
Stop by the lab prior to leaving today. I will notify you of your results once received.   Try to get up and walk at least every one hour.   It was a pleasure to see you today!   Influenza (Flu) Vaccine (Inactivated or Recombinant): What You Need to Know 1. Why get vaccinated? Influenza vaccine can prevent influenza (flu). Flu is a contagious disease that spreads around the [place] every year, usually between October and May. Anyone can get the flu, but it is more dangerous for some people. Infants and young children, people 16 years of age and older, pregnant women, and people with certain health conditions or a weakened immune system are at greatest risk of flu complications. Pneumonia, bronchitis, sinus infections and ear infections are examples of flu-related complications. If you have a medical condition, such as heart disease, cancer or diabetes, flu can make it worse. Flu can cause fever and chills, sore throat, muscle aches, fatigue, cough, headache, and runny or stuffy nose. Some people may have vomiting and diarrhea, though this is more common in children than adults. Each year thousands of people in the [place] die from flu, and many more are hospitalized. Flu vaccine prevents millions of illnesses and flu-related visits to the doctor each year. 2. Influenza vaccine CDC recommends everyone 29 months of age and older get vaccinated every flu season. Children 6 months through 33 years of age may need 2 doses during a single flu season. Everyone else needs only 1 dose each flu season. It takes about 2 weeks for protection to develop after vaccination. There are many flu viruses, and they are always changing. Each year a new flu vaccine is made to protect against three or four viruses that are likely to cause disease in the upcoming flu season. Even when the vaccine doesn't exactly match these viruses, it may still provide some protection. Influenza vaccine does not cause  flu. Influenza vaccine may be given at the same time as other vaccines. 3. Talk with your health care provider Tell your vaccine provider if the person getting the vaccine:  Has had an allergic reaction after a previous dose of influenza vaccine, or has any severe, life-threatening allergies.  Has ever had Guillain-Barr Syndrome (also called GBS). In some cases, your health care provider may decide to postpone influenza vaccination to a future visit. People with minor illnesses, such as a cold, may be vaccinated. People who are moderately or severely ill should usually wait until they recover before getting influenza vaccine. Your health care provider can give you more information. 4. Risks of a vaccine reaction  Soreness, redness, and swelling where shot is given, fever, muscle aches, and headache can happen after influenza vaccine.  There may be a very small increased risk of Guillain-Barr Syndrome (GBS) after inactivated influenza vaccine (the flu shot). Young children who get the flu shot along with pneumococcal vaccine (PCV13), and/or DTaP vaccine at the same time might be slightly more likely to have a seizure caused by fever. Tell your health care provider if a child who is getting flu vaccine has ever had a seizure. People sometimes faint after medical procedures, including vaccination. Tell your provider if you feel dizzy or have vision changes or ringing in the ears. As with any medicine, there is a very remote chance of a vaccine causing a severe allergic reaction, other serious injury, or death. 5. What if there is a serious problem? An allergic reaction could occur after the vaccinated  person leaves the clinic. If you see signs of a severe allergic reaction (hives, swelling of the face and throat, difficulty breathing, a fast heartbeat, dizziness, or weakness), call 9-1-1 and get the person to the nearest hospital. For other signs that concern you, call your health care  provider. Adverse reactions should be reported to the Vaccine Adverse Event Reporting System (VAERS). Your health care provider will usually file this report, or you can do it yourself. Visit the VAERS website at www.vaers.SamedayNews.es or call (682)653-4132.VAERS is only for reporting reactions, and VAERS staff do not give medical advice. 6. The National Vaccine Injury Compensation Program The [employer] Vaccine Injury Compensation Program (VICP) is a federal program that was created to compensate people who may have been injured by certain vaccines. Visit the VICP website at GoldCloset.com.ee or call 661-858-1033 to learn about the program and about filing a claim. There is a time limit to file a claim for compensation. 7. How can I learn more?  Ask your healthcare provider.  Call your local or state health department.  Contact the Centers for Disease Control and Prevention (CDC): ? Call 3251951412 (1-800-CDC-INFO) or ? Visit CDC's https://gibson.com/ Vaccine Information Statement (Interim) Inactivated Influenza Vaccine (01/31/2018) This information is not intended to replace advice given to you by your health care provider. Make sure you discuss any questions you have with your health care provider. Document Revised: 09/24/2018 Document Reviewed: 02/04/2018 Elsevier Patient Education  [place].

## 2020-04-20 NOTE — Assessment & Plan Note (Signed)
Following with ophthalmology. Compliant to regimen.

## 2020-04-20 NOTE — Assessment & Plan Note (Signed)
Doing well on Zoloft, continue same. 

## 2020-04-20 NOTE — Assessment & Plan Note (Signed)
Chronic and continued, daughter feels that propranolol 160 mg BID is somewhat effective.   Continue same. HR stable.

## 2020-04-20 NOTE — Assessment & Plan Note (Signed)
Continued, slowly progressing. Continue Aricept.

## 2020-04-21 ENCOUNTER — Other Ambulatory Visit (INDEPENDENT_AMBULATORY_CARE_PROVIDER_SITE_OTHER): Payer: Medicare Other

## 2020-04-21 ENCOUNTER — Other Ambulatory Visit: Payer: Self-pay | Admitting: Primary Care

## 2020-04-21 DIAGNOSIS — G250 Essential tremor: Secondary | ICD-10-CM | POA: Diagnosis not present

## 2020-04-21 DIAGNOSIS — R600 Localized edema: Secondary | ICD-10-CM

## 2020-04-21 DIAGNOSIS — F0390 Unspecified dementia without behavioral disturbance: Secondary | ICD-10-CM | POA: Diagnosis not present

## 2020-04-21 DIAGNOSIS — Z23 Encounter for immunization: Secondary | ICD-10-CM

## 2020-04-21 DIAGNOSIS — I10 Essential (primary) hypertension: Secondary | ICD-10-CM

## 2020-04-21 DIAGNOSIS — E039 Hypothyroidism, unspecified: Secondary | ICD-10-CM

## 2020-04-21 DIAGNOSIS — R0989 Other specified symptoms and signs involving the circulatory and respiratory systems: Secondary | ICD-10-CM

## 2020-04-21 DIAGNOSIS — J439 Emphysema, unspecified: Secondary | ICD-10-CM | POA: Diagnosis not present

## 2020-04-21 DIAGNOSIS — H409 Unspecified glaucoma: Secondary | ICD-10-CM

## 2020-04-21 DIAGNOSIS — F331 Major depressive disorder, recurrent, moderate: Secondary | ICD-10-CM

## 2020-04-21 LAB — TSH: TSH: 3.14 u[IU]/mL (ref 0.35–4.50)

## 2020-04-21 LAB — COMPREHENSIVE METABOLIC PANEL
ALT: 11 U/L (ref 0–35)
AST: 17 U/L (ref 0–37)
Albumin: 3.9 g/dL (ref 3.5–5.2)
Alkaline Phosphatase: 59 U/L (ref 39–117)
BUN: 24 mg/dL — ABNORMAL HIGH (ref 6–23)
CO2: 29 mEq/L (ref 19–32)
Calcium: 9.1 mg/dL (ref 8.4–10.5)
Chloride: 105 mEq/L (ref 96–112)
Creatinine, Ser: 1.28 mg/dL — ABNORMAL HIGH (ref 0.40–1.20)
GFR: 35.83 mL/min — ABNORMAL LOW (ref >60.00)
Glucose, Bld: 96 mg/dL (ref 70–99)
Potassium: 4.3 mEq/L (ref 3.5–5.1)
Sodium: 142 mEq/L (ref 135–145)
Total Bilirubin: 0.5 mg/dL (ref 0.2–1.2)
Total Protein: 6.8 g/dL (ref 6.0–8.3)

## 2020-04-21 LAB — LIPID PANEL
Cholesterol: 167 mg/dL (ref 0–200)
HDL: 39.5 mg/dL (ref >39.00)
LDL Cholesterol: 88 mg/dL (ref 0–99)
NonHDL: 127.57
Total CHOL/HDL Ratio: 4
Triglycerides: 196 mg/dL — ABNORMAL HIGH (ref 0.0–149.0)
VLDL: 39.2 mg/dL (ref 0.0–40.0)

## 2020-04-22 ENCOUNTER — Other Ambulatory Visit: Payer: Self-pay

## 2020-04-22 ENCOUNTER — Encounter: Payer: Self-pay | Admitting: Podiatry

## 2020-04-22 ENCOUNTER — Ambulatory Visit (INDEPENDENT_AMBULATORY_CARE_PROVIDER_SITE_OTHER): Payer: Medicare Other | Admitting: Podiatry

## 2020-04-22 DIAGNOSIS — B351 Tinea unguium: Secondary | ICD-10-CM | POA: Diagnosis not present

## 2020-04-22 DIAGNOSIS — M79675 Pain in left toe(s): Secondary | ICD-10-CM | POA: Diagnosis not present

## 2020-04-22 DIAGNOSIS — M79674 Pain in right toe(s): Secondary | ICD-10-CM | POA: Diagnosis not present

## 2020-04-22 DIAGNOSIS — R0989 Other specified symptoms and signs involving the circulatory and respiratory systems: Secondary | ICD-10-CM

## 2020-04-22 NOTE — Progress Notes (Signed)
  Subjective:  Patient ID: Nicole Downs, female    DOB: 1923/11/24,  MRN: [id number]  Chief Complaint  Patient presents with  . Nail Problem    nail trim and RFC   No complaints today   86 y.o. female returns for the above complaint.  She states that her toenails have been painful bilaterally.  She is here with her daughter today who has been taking care of her painful toenails.  She states that last time she attempted to cut her toenails and started cutting her skin which started bleeding.  She is unable to take care of these foot toenails as they have thickened elongated dystrophic.  She denies any other acute complaints.  She is at right ambulating in regular shoes.  Patient also has secondary complaint of xerotic skin.  Patient denies using any lotion to the lower extremity.  She would like to know if there is anything else that can be further done for this.  Objective:  There were no vitals filed for this visit. Podiatric Exam: Vascular: dorsalis pedis and posterior tibial pulses are non palpable bilateral. Capillary return is sluggish. Temperature gradient is WNL. Skin turgor WNL  Sensorium: Normal Semmes Weinstein monofilament test. Normal tactile sensation bilaterally. Nail Exam: Pt has thick disfigured discolored nails with subungual debris noted bilateral entire nail hallux through fifth toenails Ulcer Exam: There is no evidence of ulcer or pre-ulcerative changes or infection. Orthopedic Exam: Muscle tone and strength are WNL. No limitations in general ROM. No crepitus or effusions noted. HAV  B/L.  Hammer toes 2-5  B/L. Skin: No Porokeratosis. No infection or ulcers.  Dry skin noted bilateral lower extremity consistent with xerosis  Assessment & Plan:  Patient was evaluated and treated and all questions answered.  Xerosis bilateral lower extremity -I explained to the patient the etiology of xerosis and various treatment options were discussed with the patient.  I explained to  the patient that she will need to apply over-the-counter lotion for dry skin twice a day.  Patient states understanding and will try her best to apply lotion.  Onychomycosis with pain  -Nails palliatively debrided as below. -Educated on self-care  Procedure: Nail Debridement Rationale: pain  Type of Debridement: manual, sharp debridement. Instrumentation: Nail nipper, rotary burr. Number of Nails: 10  Procedures and Treatment: Consent by patient was obtained for treatment procedures. The patient understood the discussion of treatment and procedures well. All questions were answered thoroughly reviewed. Debridement of mycotic and hypertrophic toenails, 1 through 5 bilateral and clearing of subungual debris. No ulceration, no infection noted.  Return Visit-Office Procedure: Patient instructed to return to the office for a follow up visit 3 months for continued evaluation and treatment.  Boneta Lucks, DPM    No follow-ups on file.

## 2020-04-27 ENCOUNTER — Ambulatory Visit (INDEPENDENT_AMBULATORY_CARE_PROVIDER_SITE_OTHER): Payer: Medicare Other | Admitting: Dermatology

## 2020-04-27 ENCOUNTER — Encounter: Payer: Self-pay | Admitting: Dermatology

## 2020-04-27 ENCOUNTER — Other Ambulatory Visit: Payer: Self-pay

## 2020-04-27 DIAGNOSIS — L03116 Cellulitis of left lower limb: Secondary | ICD-10-CM | POA: Diagnosis not present

## 2020-04-27 DIAGNOSIS — D0462 Carcinoma in situ of skin of left upper limb, including shoulder: Secondary | ICD-10-CM

## 2020-04-27 DIAGNOSIS — L570 Actinic keratosis: Secondary | ICD-10-CM | POA: Diagnosis not present

## 2020-04-27 DIAGNOSIS — L578 Other skin changes due to chronic exposure to nonionizing radiation: Secondary | ICD-10-CM | POA: Diagnosis not present

## 2020-04-27 DIAGNOSIS — L821 Other seborrheic keratosis: Secondary | ICD-10-CM

## 2020-04-27 DIAGNOSIS — D099 Carcinoma in situ, unspecified: Secondary | ICD-10-CM

## 2020-04-27 MED ORDER — MUPIROCIN 2 % EX OINT: TOPICAL_OINTMENT | CUTANEOUS | 0 refills | Status: DC

## 2020-04-27 NOTE — Progress Notes (Signed)
Follow-Up Visit   Subjective  Nicole Downs is a 86 y.o. female who presents for the following: bx proven SCCIS (on the L upper arm proximal and distal - patient is here today for [facility] ). She has noticed new lesions on the chest and L calf that she would like checked as well.  The following portions of the chart were reviewed this encounter and updated as appropriate:  Tobacco  Allergies  Meds  Problems  Med Hx  Surg Hx  Fam Hx     Review of Systems:  No other skin or systemic complaints except as noted in HPI or Assessment and Plan.  Objective  Well appearing patient in no apparent distress; mood and affect are within normal limits.  A focused examination was performed including the chest and extremities. Relevant physical exam findings are noted in the Assessment and Plan.  Objective  L upper arm proximal: Keratotic pink papule/nodule or plaque.   Objective  L upper arm distal: Keratotic pink papule/nodule or plaque.   Objective  L calf: Healing ulceration.  Objective  R medial clavicle x 10, R lat neck x 4, R hand x 3, L hand x 4 (21): Erythematous thin papules/macules with gritty scale.   Assessment & Plan  Squamous cell carcinoma in situ (2) L upper arm proximal  Destruction of lesion Complexity: extensive   Destruction method: electrodesiccation and curettage   Informed consent: discussed and consent obtained   Timeout:  patient name, date of birth, surgical site, and procedure verified Procedure prep:  Patient was prepped and draped in usual sterile fashion Prep type:  Isopropyl alcohol Anesthesia: the lesion was anesthetized in a standard fashion   Anesthetic:  1% lidocaine w/ epinephrine 1-100,000 buffered w/ 8.4% NaHCO3 Curettage performed in three different directions: Yes   Electrodesiccation performed over the curetted area: Yes   Lesion length (cm):  1.5 Lesion width (cm):  1.5 Margin per side (cm):  0.2 Final wound size (cm):   1.9 Hemostasis achieved with:  pressure, aluminum chloride and electrodesiccation Outcome: patient tolerated procedure well with no complications   Post-procedure details: sterile dressing applied and wound care instructions given   Dressing type: bandage and petrolatum    L upper arm distal  Destruction of lesion Complexity: extensive   Destruction method: electrodesiccation and curettage   Informed consent: discussed and consent obtained   Timeout:  patient name, date of birth, surgical site, and procedure verified Procedure prep:  Patient was prepped and draped in usual sterile fashion Prep type:  Isopropyl alcohol Anesthesia: the lesion was anesthetized in a standard fashion   Anesthetic:  1% lidocaine w/ epinephrine 1-100,000 buffered w/ 8.4% NaHCO3 Curettage performed in three different directions: Yes   Electrodesiccation performed over the curetted area: Yes   Lesion length (cm):  0.7 Lesion width (cm):  0.7 Margin per side (cm):  0.2 Final wound size (cm):  1.1 Hemostasis achieved with:  pressure, aluminum chloride and electrodesiccation Outcome: patient tolerated procedure well with no complications   Post-procedure details: sterile dressing applied and wound care instructions given   Dressing type: bandage and petrolatum    Cellulitis of left lower extremity - 2ndary to insect bite reaction? L calf 1 cm pink patch Start Mupirocin 2% ointment to affected area BID until healed.  mupirocin ointment (BACTROBAN) 2 % - L calf  AK (actinic keratosis) (21) R medial clavicle x 10, R lat neck x 4, R hand x 3, L hand x 4  Destruction of  lesion - R medial clavicle x 10, R lat neck x 4, R hand x 3, L hand x 4 Complexity: simple   Destruction method: cryotherapy   Informed consent: discussed and consent obtained   Timeout:  patient name, date of birth, surgical site, and procedure verified Lesion destroyed using liquid nitrogen: Yes   Region frozen until ice ball extended  beyond lesion: Yes   Outcome: patient tolerated procedure well with no complications   Post-procedure details: wound care instructions given    Actinic Damage - chronic, secondary to cumulative UV radiation exposure/sun exposure over time - diffuse scaly erythematous macules with underlying dyspigmentation - Recommend daily broad spectrum sunscreen SPF 30+ to sun-exposed areas, reapply every 2 hours as needed.  - Call for new or changing lesions.  Seborrheic Keratoses - Stuck-on, waxy, tan-brown papules and plaques  - Discussed benign etiology and prognosis. - Observe - Call for any changes  Return for appointment as scheduled.  Luther Redo, CMA, am acting as scribe for Sarina Ser, MD .  Documentation: I have reviewed the above documentation for accuracy and completeness, and I agree with the above.  Sarina Ser, MD

## 2020-04-27 NOTE — Patient Instructions (Signed)

## 2020-05-17 DIAGNOSIS — H2511 Age-related nuclear cataract, right eye: Secondary | ICD-10-CM | POA: Diagnosis not present

## 2020-05-26 ENCOUNTER — Other Ambulatory Visit: Payer: Self-pay | Admitting: Primary Care

## 2020-05-26 DIAGNOSIS — F331 Major depressive disorder, recurrent, moderate: Secondary | ICD-10-CM

## 2020-06-01 ENCOUNTER — Other Ambulatory Visit: Payer: Self-pay

## 2020-06-01 ENCOUNTER — Ambulatory Visit (INDEPENDENT_AMBULATORY_CARE_PROVIDER_SITE_OTHER): Payer: Medicare Other | Admitting: Dermatology

## 2020-06-01 DIAGNOSIS — L570 Actinic keratosis: Secondary | ICD-10-CM | POA: Diagnosis not present

## 2020-06-01 DIAGNOSIS — L821 Other seborrheic keratosis: Secondary | ICD-10-CM

## 2020-06-01 DIAGNOSIS — Z86007 Personal history of in-situ neoplasm of skin: Secondary | ICD-10-CM

## 2020-06-01 DIAGNOSIS — L578 Other skin changes due to chronic exposure to nonionizing radiation: Secondary | ICD-10-CM | POA: Diagnosis not present

## 2020-06-01 DIAGNOSIS — L820 Inflamed seborrheic keratosis: Secondary | ICD-10-CM | POA: Diagnosis not present

## 2020-06-01 NOTE — Patient Instructions (Signed)
Cryotherapy Aftercare   Wash gently with soap and water everyday.    Apply Vaseline and Band-Aid daily until healed.

## 2020-06-01 NOTE — Progress Notes (Signed)
   Follow-Up Visit   Subjective  Nicole Downs is a 86 y.o. female who presents for the following: Follow-up (3 month AK follow up of left hand and face treated with LN2 x 22. 6 week follow up of SCC in situ of left upper arm prox and left upper arm distal treated with [facility] 04/27/2020.).  Accompanied by daughter who contributes to history  The following portions of the chart were reviewed this encounter and updated as appropriate:   Tobacco  Allergies  Meds  Problems  Med Hx  Surg Hx  Fam Hx     Review of Systems:  No other skin or systemic complaints except as noted in HPI or Assessment and Plan.  Objective  Well appearing patient in no apparent distress; mood and affect are within normal limits.  A focused examination was performed including face, arms, hands. Relevant physical exam findings are noted in the Assessment and Plan.  Objective  R hand, L hand, R clavicle, R neck (18): Erythematous thin papules/macules with gritty scale.   Objective  Right Wrist, Right forearm (2): Erythematous keratotic or waxy stuck-on papule or plaque.   Objective  Left upper arm distal, Left upper arm proximal: Well healed scar with no evidence of recurrence.    Assessment & Plan  AK (actinic keratosis) (18) R hand, L hand, R clavicle, R neck  Destruction of lesion - R hand, L hand, R clavicle, R neck Complexity: simple   Destruction method: cryotherapy   Informed consent: discussed and consent obtained   Timeout:  patient name, date of birth, surgical site, and procedure verified Lesion destroyed using liquid nitrogen: Yes   Region frozen until ice ball extended beyond lesion: Yes   Outcome: patient tolerated procedure well with no complications   Post-procedure details: wound care instructions given    Inflamed seborrheic keratosis (2) Right Wrist, Right forearm  Destruction of lesion - Right Wrist, Right forearm Complexity: simple   Destruction method: cryotherapy    Informed consent: discussed and consent obtained   Timeout:  patient name, date of birth, surgical site, and procedure verified Lesion destroyed using liquid nitrogen: Yes   Region frozen until ice ball extended beyond lesion: Yes   Outcome: patient tolerated procedure well with no complications   Post-procedure details: wound care instructions given    History of squamous cell carcinoma in situ (SCCIS) of skin Left upper arm distal, Left upper arm proximal  Clear. Observe for recurrence. Call clinic for new or changing lesions.  Recommend regular skin exams, daily broad-spectrum spf 30+ sunscreen use, and photoprotection.      Actinic Damage - chronic, secondary to cumulative UV radiation exposure/sun exposure over time - diffuse scaly erythematous macules with underlying dyspigmentation - Recommend daily broad spectrum sunscreen SPF 30+ to sun-exposed areas, reapply every 2 hours as needed.  - Call for new or changing lesions.  Seborrheic Keratoses - Stuck-on, waxy, tan-brown papules and plaques  - Discussed benign etiology and prognosis. - Observe - Call for any changes  Return in about 4 months (around 09/30/2020).   I, Ashok Cordia, CMA, am acting as scribe for Sarina Ser, MD .  Documentation: I have reviewed the above documentation for accuracy and completeness, and I agree with the above.  Sarina Ser, MD

## 2020-06-06 ENCOUNTER — Encounter: Payer: Self-pay | Admitting: Dermatology

## 2020-06-21 ENCOUNTER — Other Ambulatory Visit: Payer: Self-pay | Admitting: Primary Care

## 2020-06-21 DIAGNOSIS — G250 Essential tremor: Secondary | ICD-10-CM

## 2020-07-07 ENCOUNTER — Telehealth: Payer: Self-pay

## 2020-07-08 NOTE — Chronic Care Management (AMB) (Addendum)
° ° °  Chronic Care Management Pharmacy Assistant   Name: Nicole Downs  MRN: [id number] DOB: 08/19/1923  Reason for Encounter: Medication Review - Follow up CCM appointment on 07/09/20   Patient Questions:  1.  Have you seen any other providers since your last visit? Yes 06/01/20- Dr. Sarina Ser- Dermatology 04/27/20- Dr. Sarina Ser- Dermatology  04/22/20- Dr. Boneta Lucks- Podiatry 04/20/20- Alma Friendly, NP- PCP 04/16/20- Webb Silversmith- Internal Medicine 03/02/20- Dr. Sarina Ser- Dermatology 01/20/20- Dr. Boneta Lucks- Podiatry   2.  Any changes in your medicines or health? Yes 04/27/20- Dr. Nehemiah Massed started patient on Mupirocin 2% to apply to calf BID. 04/20/20- Alma Friendly, NP discontinued Ferrous sulfate     PCP : Pleas Koch, NP  Allergies:   Allergies  Allergen Reactions   Augmentin [Amoxicillin-Pot Clavulanate] Rash   Amlodipine Other (See Comments)    Ankle edema    Medications: Outpatient Encounter Medications as of 07/07/2020  Medication Sig   BETIMOL 0.5 % ophthalmic solution Apply 1 drop to eye 2 (two) times daily.    Calcium Carbonate (CALCIUM 600 PO) Take 600 mg by mouth 2 (two) times daily.   Cholecalciferol (VITAMIN D3) 10000 units TABS Take 1,000 mg by mouth 2 (two) times daily.   donepezil (ARICEPT) 5 MG tablet TAKE 1 TABLET BY MOUTH EVERYDAY AT BEDTIME   levobunolol (BETAGAN) 0.5 % ophthalmic solution Place 1 drop into both eyes 2 (two) times daily.   levothyroxine (SYNTHROID) 25 MCG tablet TAKE 1 TABLET BY MOUTH DAILY WITHOUT FOOD, WATER ONLY, DO NOT EAT WITHIN 30 MINUTES AFTER TAKING   losartan (COZAAR) 25 MG tablet TAKE 1 TABLET BY MOUTH EVERY DAY FOR BLOOD PRESSURE   Melatonin-Pyridoxine (MELATIN PO) Take 1 tablet by mouth at bedtime as needed (sleep).   mupirocin ointment (BACTROBAN) 2 % Apply to aa L calf BID until healed   propranolol (INDERAL) 80 MG tablet TAKE 2 TABLETS BY MOUTH TWICE A DAY FOR TREMOR   sertraline (ZOLOFT) 25  MG tablet TAKE 1 TABLET BY MOUTH AT BEDTIME FOR DEPRESSION.   No facility-administered encounter medications on file as of 07/07/2020.    Current Diagnosis: Patient Active Problem List   Diagnosis Date Noted   Pain due to onychomycosis of toenails of both feet 10/20/2019   Glaucoma 04/16/2019   Overgrown toenails 04/16/2019   Anemia 04/16/2019   Recurrent UTI 06/07/2018   Diverticulitis 05/02/2018   Moderate episode of recurrent major depressive disorder ([place]) 04/10/2018   Pulmonary emphysema ([place]) 03/29/2017   Memory loss 08/09/2016   Essential tremor 04/10/2016   Vitamin D deficiency 10/08/2015   Lower extremity edema 10/08/2015   Essential hypertension 06/24/2015   Hypothyroidism 06/24/2015   Dementia ([place]) 06/24/2015   Poor peripheral circulation 06/24/2015    Contacted Ms. Zaccone ahead of her appointment to remind her of her appointment and update any health changes since last visit with Debbora Dus, Pharm. D on 01/08/20.  Are you having any problems with your medications? Patient's daughter denies any problems with medications.  What concerns would like to discuss with the pharmacist? Not at this time.  Patient's daughter, Nicole Downs, reminded to have all medications and supplements available for review with Debbora Dus, Pharm. D, at their telephone visit on 07/09/20.   Patient's daughter, Nicole Downs, has POA and will be the one to contact for appointment.  Follow-Up:  Pharmacist Review   Debbora Dus, CPP notified  Margaretmary Dys, [place] [facility] Assistant [phone]

## 2020-07-09 ENCOUNTER — Ambulatory Visit: Payer: Medicare Other

## 2020-07-09 ENCOUNTER — Other Ambulatory Visit: Payer: Self-pay

## 2020-07-09 DIAGNOSIS — I10 Essential (primary) hypertension: Secondary | ICD-10-CM

## 2020-07-09 DIAGNOSIS — D649 Anemia, unspecified: Secondary | ICD-10-CM

## 2020-07-09 NOTE — Chronic Care Management (AMB) (Addendum)
Chronic Care Management Pharmacy  Name: Nicole Downs  MRN: [id number] DOB: 1923-12-13  Chief Complaint/ HPI  Nicole Downs,  87 y.o., female presents for their Follow-Up CCM visit with the clinical pharmacist via telephone. Visit completed with patient's daughter, Nicole Downs.   PCP : Nicole Koch, NP  Their chronic conditions include: hypertension, hypothyroidism, dementia, essential tremor, vitamin D deficiency, diverticulitis, glaucoma, anemia, major depressive disorder   Caregiver concerns: None   Office Visits:  04/20/20: PCP visit - BP borderline high in office, daughter will monitor home readings.Tremor stable on propranolol. No medication changes today.  05/27/19: Message MyChart - PCP reviewed home BP readings, maintain same dose of losartan due to BP fluctuations from 100s to 150s  04/16/19: PCP visit - continue current medications, labs done, schedule eye exam and podiatry appt   Consult Visit:  12/01/19: Dermatology - actinic keratosis follow up  10/20/19: Podiatry - pain due to onychomycosis of toenails, both feet  Medications: Outpatient Encounter Medications as of 07/09/2020  Medication Sig   BETIMOL 0.5 % ophthalmic solution Apply 1 drop to eye 2 (two) times daily.    Calcium Carbonate (CALCIUM 600 PO) Take 600 mg by mouth 2 (two) times daily.   Cholecalciferol (VITAMIN D3) 10000 units TABS Take 1,000 mg by mouth 2 (two) times daily.   donepezil (ARICEPT) 5 MG tablet TAKE 1 TABLET BY MOUTH EVERYDAY AT BEDTIME   levobunolol (BETAGAN) 0.5 % ophthalmic solution Place 1 drop into both eyes 2 (two) times daily.   levothyroxine (SYNTHROID) 25 MCG tablet TAKE 1 TABLET BY MOUTH DAILY WITHOUT FOOD, WATER ONLY, DO NOT EAT WITHIN 30 MINUTES AFTER TAKING   losartan (COZAAR) 25 MG tablet TAKE 1 TABLET BY MOUTH EVERY DAY FOR BLOOD PRESSURE   Melatonin-Pyridoxine (MELATIN PO) Take 1 tablet by mouth at bedtime as needed (sleep).   mupirocin ointment  (BACTROBAN) 2 % Apply to aa L calf BID until healed   propranolol (INDERAL) 80 MG tablet TAKE 2 TABLETS BY MOUTH TWICE A DAY FOR TREMOR   sertraline (ZOLOFT) 25 MG tablet TAKE 1 TABLET BY MOUTH AT BEDTIME FOR DEPRESSION.   No facility-administered encounter medications on file as of 07/09/2020.   Current Diagnosis/Assessment:   Goals Addressed            This Visit's Progress    Pharmacy Care Plan       CARE PLAN ENTRY (see longitudinal plan of care for additional care plan information)  Current Barriers:   Chronic Disease Management support, education, and care coordination needs related to Hypertension and Anemia   Hypertension BP Readings from Last 3 Encounters:  04/20/20 (!) 149/74  05/13/19 (!) 158/80  04/16/19 128/80   Pharmacist Clinical Goal(s): o Over the next 6 months, patient will work with PharmD and providers to achieve BP goal <140/90 and prevent hypotension < 90/60 mmHg  Current regimen:  o Losartan 25 mg - 1 tablet daily  Interventions: o Discussed home BP monitoring as recommended by PCP 06/2019  Patient self care activities - Over the next 6 months, patient will: o Purchase BP monitor with arm cuff and check BP at home  o Call if readings consistently above 150/90  Anemia  Pharmacist Clinical Goal(s) o Over the next 6 months, patient will work with PharmD and providers to reduce unnecessary medications  Current regimen:   No pharmacotherapy - off iron since July 2021  Interventions: o Recommend repeat CBC, iron panel with annual labs   Patient self care activities -  Over the next 6 months, patient will: o Continue off iron  o Please call if you notice any abnormal bruising or bleeding, fatigue, weakness   Please see past updates related to this goal by clicking on the "Past Updates" button in the selected goal       Hypertension   CMP Latest Ref Rng & Units 04/21/2020 04/16/2019 05/10/2018  Glucose 70 - 99 mg/dL 96 98 94  BUN 6 - 23  mg/dL 24(H) 21 15  Creatinine 0.40 - 1.20 mg/dL 1.28(H) 1.22(H) 1.08  Sodium 135 - 145 mEq/L 142 140 140  Potassium 3.5 - 5.1 mEq/L 4.3 4.1 4.0  Chloride 96 - 112 mEq/L 105 103 105  CO2 19 - 32 mEq/L 29 30 28   Calcium 8.4 - 10.5 mg/dL 9.1 9.6 9.8  Total Protein 6.0 - 8.3 g/dL 6.8 7.3 -  Total Bilirubin 0.2 - 1.2 mg/dL 0.5 0.5 -  Alkaline Phos 39 - 117 U/L 59 58 -  AST 0 - 37 U/L 17 21 -  ALT 0 - 35 U/L 11 17 -   Office blood pressures are: BP Readings from Last 3 Encounters:  04/20/20 (!) 149/74  05/13/19 (!) 158/80  04/16/19 128/80   Patient has tried these meds in the past: lisinopril (cough), amlodipine  BP goal < 140/90 mmHg Patient is currently controlled on the following medications:   Losartan 25 mg - 1 tablet daily  Update 07/09/20: Daughter reports they have monitored her BP some but using a wrist monitor and were recently informed its likely inaccurate. Discussed purchasing an Omron with arm cuff and importance of BP not running consistently above 150/90 due to CV risk.   Plan: Continue current medications; Recommend purchasing a new BP monitor. Check BP at home. Call if BP is elevated. CMA to review next month.   Dementia   Patient has failed these meds in past: none reported  Patient is currently controlled on the following medications:   Donepezil 5 mg - 1 tablet at bedtime  No caregiver concerns 07/09/20  Plan: Continue current medications   Essential Tremor   Patient has failed these meds in past: primidone - memory loss  Patient is currently uncontrolled on the following medications:   Propranolol 80 mg - 2 tablets BID   No caregiver concerns 07/09/20  Plan: Continue current medications   Depression   Patient has failed these meds in past: none Patient is currently controlled on the following medications:   Sertraline 25 mg - 1 tablet qhs   Melatonin-pyridoxine - 1 tablet qhs PRN  No caregiver concerns 07/09/20  Plan: Continue current  medications  Hypothyroidism   Lab Results  Component Value Date/Time   TSH 3.14 04/21/2020 08:03 AM   TSH 2.08 04/16/2019 10:40 AM   Patient has failed these meds in past: none Patient is currently controlled on the following medications:   Levothyroxine 25 mcg - 1 tablet daily on empty stomach  No changes/concerns 07/09/20  Plan: Continue current medications   Vitamin D Deficiency   Vitamin D 04/10/2018: 104 Patient has failed these meds in past: none reported Patient is currently controlled on the following medications:   Vitamin D 1000 IU - 1 capsule BID  Calcium 600 mg - 1 tab BID for bone health  Update 07/09/20: Nicole Downs confirms patient still taking. Denies any falls.   Plan: Continue current medications  Anemia   Iron/TIBC/Ferritin/ %Sat    Component Value Date/Time   IRON 84 10/08/2015 1734  IRONPCTSAT 23.7 10/08/2015 1734   CBC Latest Ref Rng & Units 04/16/2019 05/10/2018 05/03/2018  WBC 4.0 - 10.5 K/uL 9.9 7.8 10.2  Hemoglobin 12.0 - 15.0 g/dL 13.2 11.7(L) 10.2(L)  Hematocrit 36.0 - 46.0 % 39.2 34.3(L) 32.2(L)  Platelets 150.0 - 400.0 K/uL 250.0 325.0 207   Patient has failed these meds in past: none reported Patient is currently on the following medications:   None   At [facility] 07/21, caregiver reported the patient started iron per her sister's recommendation. She would like to know if patient needs to continue. Patient also had some constipation> Supplemental iron was stopped at that time with plan to repeat labs in October 2021. Labs not completed.   Update 07/09/20: Patient has been off iron since last CCM 12/2019. No updated labs. As patient with history of asymptomatic, mild anemia and no know history of iron deficiency, okay to wait until next year for labs with annual visit.   Plan: Continue off iron for now - recommend re-evaluating CBC October 2022 with annual visit.   Glaucoma   Patient has failed these meds in past: none reported Patient is  currently on the following medications:   Levobunolol 0.5% - 1 drop BID  Refresh Eyes - 1 drop BID    No caregiver concerns 07/09/20  Plan: Continue current medications  OTCs    Tylenol 325 mg - 1 tablet at bedtime  Glucosamine - 1 tablet daily  We discussed: Pain - patient does not report pain but rubs her legs/hands at times. Caregiver gives her Tylenol every night to help her sleep.  No caregiver concerns 07/09/20  Plan: Continue current medications   Medication Management:  Pharmacy: [facility]) - taking good care of her 07/09/20  Adherence: daughter fills pillbox every 4 weeks  Social support: lives with daughter who is the primary caregiver  Affordability: denies cost concerns today 07/09/20  Patient is fine with taking medications. She does not refuse meds. She is okay with current regimen 07/09/20  CCM Follow Up: 6 months, telephone call   Debbora Dus, PharmD Clinical Pharmacist [facility] at [facility] [phone]

## 2020-07-09 NOTE — Patient Instructions (Addendum)
Dear Nicole Downs,  Below is a summary of the goals we discussed during our follow up appointment on July 09, 2020. Please contact me anytime with questions or concerns.   Visit Information  Goals Addressed            This Visit's Progress   . Pharmacy Care Plan       CARE PLAN ENTRY (see longitudinal plan of care for additional care plan information)  Current Barriers:  . Chronic Disease Management support, education, and care coordination needs related to Hypertension and Anemia   Hypertension BP Readings from Last 3 Encounters:  04/20/20 (!) 149/74  05/13/19 (!) 158/80  04/16/19 128/80 .  Pharmacist Clinical Goal(s): o Over the next 6 months, patient will work with PharmD and providers to achieve BP goal <140/90 and prevent hypotension < 90/60 mmHg . Current regimen:  o Losartan 25 mg - 1 tablet daily . Interventions: o Discussed home BP monitoring as recommended by PCP 06/2019 . Patient self care activities - Over the next 6 months, patient will: o Purchase BP monitor with arm cuff and check BP at home  o Call if readings consistently above 150/90  Anemia . Pharmacist Clinical Goal(s) o Over the next 6 months, patient will work with PharmD and providers to reduce unnecessary medications . Current regimen:   No pharmacotherapy - off iron since July 2021 . Interventions: o Recommend repeat CBC, iron panel with annual labs  . Patient self care activities - Over the next 6 months, patient will: o Continue off iron  o Please call if you notice any abnormal bruising or bleeding, fatigue, weakness   Please see past updates related to this goal by clicking on the "Past Updates" button in the selected goal        Patient verbalizes understanding of instructions provided today.  The pharmacy team will reach out to the patient again over the next 30 days.   Ria Comment will call to review blood pressure readings in February.  Debbora Dus, PharmD Clinical  Pharmacist [facility] at [facility] [phone]   Blood Pressure Record Sheet To take your blood pressure, you will need a blood pressure machine. You can buy a blood pressure machine (blood pressure monitor) at your clinic, drug store, or online. When choosing one, consider:  An automatic monitor that has an arm cuff.  A cuff that wraps snugly around your upper arm. You should be able to fit only one finger between your arm and the cuff.  A device that stores blood pressure reading results.  Do not choose a monitor that measures your blood pressure from your wrist or finger. Follow your health care provider's instructions for how to take your blood pressure. To use this form:  Get one reading in the morning (a.m.) before you take any medicines.  Get one reading in the evening (p.m.) before supper.  Take at least 2 readings with each blood pressure check. This makes sure the results are correct. Wait 1-2 minutes between measurements.  Write down the results in the spaces on this form.  Repeat this once a week, or as told by your health care provider.  Make a follow-up appointment with your health care provider to discuss the results. Blood pressure log Date: _______________________  a.m. _____________________(1st reading) _____________________(2nd reading)  p.m. _____________________(1st reading) _____________________(2nd reading) Date: _______________________  a.m. _____________________(1st reading) _____________________(2nd reading)  p.m. _____________________(1st reading) _____________________(2nd reading) Date: _______________________  a.m. _____________________(1st reading) _____________________(2nd reading)  p.m. _____________________(1st reading)  _____________________(2nd reading) Date: _______________________  a.m. _____________________(1st reading) _____________________(2nd reading)  p.m. _____________________(1st reading)  _____________________(2nd reading) Date: _______________________  a.m. _____________________(1st reading) _____________________(2nd reading)  p.m. _____________________(1st reading) _____________________(2nd reading) This information is not intended to replace advice given to you by your health care provider. Make sure you discuss any questions you have with your health care provider. Document Revised: 09/24/2019 Document Reviewed: 09/24/2019 Elsevier Patient Education  2021 [employer].

## 2020-07-22 ENCOUNTER — Encounter: Payer: Self-pay | Admitting: Podiatry

## 2020-07-22 ENCOUNTER — Ambulatory Visit (INDEPENDENT_AMBULATORY_CARE_PROVIDER_SITE_OTHER): Payer: Medicare Other | Admitting: Podiatry

## 2020-07-22 ENCOUNTER — Other Ambulatory Visit: Payer: Self-pay

## 2020-07-22 DIAGNOSIS — M79674 Pain in right toe(s): Secondary | ICD-10-CM

## 2020-07-22 DIAGNOSIS — B351 Tinea unguium: Secondary | ICD-10-CM | POA: Diagnosis not present

## 2020-07-22 DIAGNOSIS — M79675 Pain in left toe(s): Secondary | ICD-10-CM

## 2020-07-22 DIAGNOSIS — M2041 Other hammer toe(s) (acquired), right foot: Secondary | ICD-10-CM | POA: Diagnosis not present

## 2020-07-22 DIAGNOSIS — R0989 Other specified symptoms and signs involving the circulatory and respiratory systems: Secondary | ICD-10-CM | POA: Diagnosis not present

## 2020-07-27 ENCOUNTER — Encounter: Payer: Self-pay | Admitting: Podiatry

## 2020-07-27 NOTE — Progress Notes (Signed)
  Subjective:  Patient ID: Nicole Downs, female    DOB: Nov 18, 1923,  MRN: [id number]  Chief Complaint  Patient presents with  . Nail Problem    Nail trim RFC   87 y.o. female returns for the above complaint.  She states that her toenails have been painful bilaterally.  She is here with her daughter today who has been taking care of her painful toenails.  She states that last time she attempted to cut her toenails and started cutting her skin which started bleeding.  She is unable to take care of these foot toenails as they have thickened elongated dystrophic.  She denies any other acute complaints.  She also has secondary complaint of right second digit hammertoe.  She would like to discuss conservative treatment options for it.  She states it been bothering her especially when the shoe rubs against it.  She denies any other acute complaints  Objective:  There were no vitals filed for this visit. Podiatric Exam: Vascular: dorsalis pedis and posterior tibial pulses are non palpable bilateral. Capillary return is sluggish. Temperature gradient is WNL. Skin turgor WNL  Sensorium: Normal Semmes Weinstein monofilament test. Normal tactile sensation bilaterally. Nail Exam: Pt has thick disfigured discolored nails with subungual debris noted bilateral entire nail hallux through fifth toenails Ulcer Exam: There is no evidence of ulcer or pre-ulcerative changes or infection. Orthopedic Exam: Muscle tone and strength are WNL. No limitations in general ROM. No crepitus or effusions noted. HAV  B/L.  Hammer toes 2-5  B/L. Skin: No Porokeratosis. No infection or ulcers.  Dry skin noted bilateral lower extremity consistent with xerosis  Assessment & Plan:  Patient was evaluated and treated and all questions answered.  Xerosis bilateral lower extremity -I explained to the patient the etiology of xerosis and various treatment options were discussed with the patient.  I explained to the patient that she  will need to apply over-the-counter lotion for dry skin twice a day.  Patient states understanding and will try her best to apply lotion.  Right second hammertoe -I explained to patient the etiology of hammertoe with musculotendinous imbalances of the extensor and flexor and various treatment options were discussed.  Given that she is having mild pain to the toes likely due to secondary to pressure.  I discussed shoe gear modification with her I also dispensed toe protector help with the pain.  She states understanding and will evaluate.  Onychomycosis with pain  -Nails palliatively debrided as below. -Educated on self-care  Procedure: Nail Debridement Rationale: pain  Type of Debridement: manual, sharp debridement. Instrumentation: Nail nipper, rotary burr. Number of Nails: 10  Procedures and Treatment: Consent by patient was obtained for treatment procedures. The patient understood the discussion of treatment and procedures well. All questions were answered thoroughly reviewed. Debridement of mycotic and hypertrophic toenails, 1 through 5 bilateral and clearing of subungual debris. No ulceration, no infection noted.  Return Visit-Office Procedure: Patient instructed to return to the office for a follow up visit 3 months for continued evaluation and treatment.  Boneta Lucks, DPM    No follow-ups on file.

## 2020-08-12 ENCOUNTER — Telehealth: Payer: Self-pay

## 2020-08-12 NOTE — Chronic Care Management (AMB) (Addendum)
Chronic Care Management Pharmacy Assistant   Name: Nicole Downs  MRN: [id number] DOB: 01/08/1924  Reason for Encounter: Disease state - Update BP log  PCP : Pleas Koch, NP  Allergies:   Allergies  Allergen Reactions   Augmentin [Amoxicillin-Pot Clavulanate] Rash   Amlodipine Other (See Comments)    Ankle edema    Medications: Outpatient Encounter Medications as of 08/12/2020  Medication Sig   BETIMOL 0.5 % ophthalmic solution Apply 1 drop to eye 2 (two) times daily.    Calcium Carbonate (CALCIUM 600 PO) Take 600 mg by mouth 2 (two) times daily.   Cholecalciferol (VITAMIN D3) 10000 units TABS Take 1,000 mg by mouth 2 (two) times daily.   donepezil (ARICEPT) 5 MG tablet TAKE 1 TABLET BY MOUTH EVERYDAY AT BEDTIME   levobunolol (BETAGAN) 0.5 % ophthalmic solution Place 1 drop into both eyes 2 (two) times daily.   levothyroxine (SYNTHROID) 25 MCG tablet TAKE 1 TABLET BY MOUTH DAILY WITHOUT FOOD, WATER ONLY, DO NOT EAT WITHIN 30 MINUTES AFTER TAKING   losartan (COZAAR) 25 MG tablet TAKE 1 TABLET BY MOUTH EVERY DAY FOR BLOOD PRESSURE   Melatonin-Pyridoxine (MELATIN PO) Take 1 tablet by mouth at bedtime as needed (sleep).   mupirocin ointment (BACTROBAN) 2 % Apply to aa L calf BID until healed   propranolol (INDERAL) 80 MG tablet TAKE 2 TABLETS BY MOUTH TWICE A DAY FOR TREMOR   sertraline (ZOLOFT) 25 MG tablet TAKE 1 TABLET BY MOUTH AT BEDTIME FOR DEPRESSION.   No facility-administered encounter medications on file as of 08/12/2020.    Current Diagnosis: Patient Active Problem List   Diagnosis Date Noted   Pain due to onychomycosis of toenails of both feet 10/20/2019   Glaucoma 04/16/2019   Overgrown toenails 04/16/2019   Anemia 04/16/2019   Recurrent UTI 06/07/2018   Diverticulitis 05/02/2018   Moderate episode of recurrent major depressive disorder ([place]) 04/10/2018   Pulmonary emphysema ([place]) 03/29/2017   Memory loss 08/09/2016   Essential tremor  04/10/2016   Vitamin D deficiency 10/08/2015   Lower extremity edema 10/08/2015   Essential hypertension 06/24/2015   Hypothyroidism 06/24/2015   Dementia ([place]) 06/24/2015   Poor peripheral circulation 06/24/2015     Reviewed chart prior to disease state call. Spoke with patient regarding BP  Recent Office Vitals: BP Readings from Last 3 Encounters:  04/20/20 (!) 149/74  05/13/19 (!) 158/80  04/16/19 128/80   Pulse Readings from Last 3 Encounters:  04/20/20 88  05/13/19 (!) 48  04/16/19 67    Wt Readings from Last 3 Encounters:  04/20/20 122 lb (55.3 kg)  04/16/19 127 lb (57.6 kg)  06/07/18 123 lb (55.8 kg)     Kidney Function Lab Results  Component Value Date/Time   CREATININE 1.28 (H) 04/21/2020 08:03 AM   CREATININE 1.22 (H) 04/16/2019 10:40 AM   GFR 35.83 (L) 04/21/2020 08:03 AM   GFRNONAA 29 (L) 05/04/2018 04:59 AM   GFRAA 33 (L) 05/04/2018 04:59 AM    BMP Latest Ref Rng & Units 04/21/2020 04/16/2019 05/10/2018  Glucose 70 - 99 mg/dL 96 98 94  BUN 6 - 23 mg/dL 24(H) 21 15  Creatinine 0.40 - 1.20 mg/dL 1.28(H) 1.22(H) 1.08  Sodium 135 - 145 mEq/L 142 140 140  Potassium 3.5 - 5.1 mEq/L 4.3 4.1 4.0  Chloride 96 - 112 mEq/L 105 103 105  CO2 19 - 32 mEq/L 29 30 28   Calcium 8.4 - 10.5 mg/dL 9.1 9.6 9.8  Current antihypertensive regimen:  Losartan 25 mg - 1 tablet daily Patients daughter verbally confirmed patient is taking losartan daily without any missed doses.  How often are you checking your Blood Pressure? 3-5x per week  she checks her blood pressure in the morning after taking her medication.  Current home BP readings:   DATE:             BP               PULSE 08/12/20 154/77  61 08/13/20 155/85  53 08/14/20 126/84  57 08/15/20 146/77  58 08/16/20 135/76  56  Wrist or arm cuff: Arm Caffeine intake: Pepsi at lunch and Tea at night Salt intake: Limits salt OTC medications including pseudoephedrine or NSAIDs? No  Adherence Review: Is the patient  currently on ACE/ARB medication? Yes Does the patient have >5 day gap between last estimated fill dates? No gaps in adherence  Follow-Up:  Pharmacist Review  Debbora Dus, CPP notified  Margaretmary Dys, [place] [phone]  BP reasonable due to age. Continue current medications.  Debbora Dus, PharmD Clinical Pharmacist [facility] at [facility] [phone]

## 2020-09-14 ENCOUNTER — Other Ambulatory Visit: Payer: Self-pay | Admitting: Primary Care

## 2020-09-14 DIAGNOSIS — F0390 Unspecified dementia without behavioral disturbance: Secondary | ICD-10-CM

## 2020-09-14 NOTE — Telephone Encounter (Signed)
Called and spoke to daughter. She does not feel like it is helping and will d/c at this time. Family has noticed decline in memory in last few months. Did not want to make appointment to address at this time.

## 2020-09-14 NOTE — Telephone Encounter (Signed)
Is daughter finding any improvement with the memory medication donepezil (Aricept)? If not then I would like to discontinue, but if so then okay to send how it is pended.

## 2020-09-20 ENCOUNTER — Ambulatory Visit: Payer: Medicare Other | Admitting: Dermatology

## 2020-10-14 ENCOUNTER — Other Ambulatory Visit: Payer: Self-pay | Admitting: Primary Care

## 2020-10-14 DIAGNOSIS — I10 Essential (primary) hypertension: Secondary | ICD-10-CM

## 2020-10-19 ENCOUNTER — Telehealth: Payer: Self-pay

## 2020-10-19 NOTE — Chronic Care Management (AMB) (Addendum)
Chronic Care Management Pharmacy Assistant   Name: Nicole Downs  MRN: [id number] DOB: 02-Jan-1924  Reason for Encounter: Disease State HTN   Conditions to be addressed/monitored: HTN  Recent office visits:  None since last CCM contact  Recent consult visits:  None since last CCM contact   Hospital visits:  None in previous 6 months  Medications: Outpatient Encounter Medications as of 10/19/2020  Medication Sig   BETIMOL 0.5 % ophthalmic solution Apply 1 drop to eye 2 (two) times daily.    Calcium Carbonate (CALCIUM 600 PO) Take 600 mg by mouth 2 (two) times daily.   Cholecalciferol (VITAMIN D3) 10000 units TABS Take 1,000 mg by mouth 2 (two) times daily.   donepezil (ARICEPT) 5 MG tablet TAKE 1 TABLET BY MOUTH EVERYDAY AT BEDTIME   levobunolol (BETAGAN) 0.5 % ophthalmic solution Place 1 drop into both eyes 2 (two) times daily.   levothyroxine (SYNTHROID) 25 MCG tablet TAKE 1 TABLET BY MOUTH DAILY WITHOUT FOOD, WATER ONLY, DO NOT EAT WITHIN 30 MINUTES AFTER TAKING   losartan (COZAAR) 25 MG tablet TAKE 1 TABLET BY MOUTH EVERY DAY FOR BLOOD PRESSURE   Melatonin-Pyridoxine (MELATIN PO) Take 1 tablet by mouth at bedtime as needed (sleep).   mupirocin ointment (BACTROBAN) 2 % Apply to aa L calf BID until healed   propranolol (INDERAL) 80 MG tablet TAKE 2 TABLETS BY MOUTH TWICE A DAY FOR TREMOR   sertraline (ZOLOFT) 25 MG tablet TAKE 1 TABLET BY MOUTH AT BEDTIME FOR DEPRESSION.   No facility-administered encounter medications on file as of 10/19/2020.    Recent Office Vitals: BP Readings from Last 3 Encounters:  04/20/20 (!) 149/74  05/13/19 (!) 158/80  04/16/19 128/80   Pulse Readings from Last 3 Encounters:  04/20/20 88  05/13/19 (!) 48  04/16/19 67    Wt Readings from Last 3 Encounters:  04/20/20 122 lb (55.3 kg)  04/16/19 127 lb (57.6 kg)  06/07/18 123 lb (55.8 kg)     Kidney Function Lab Results  Component Value Date/Time   CREATININE 1.28 (H) 04/21/2020  08:03 AM   CREATININE 1.22 (H) 04/16/2019 10:40 AM   GFR 35.83 (L) 04/21/2020 08:03 AM   GFRNONAA 29 (L) 05/04/2018 04:59 AM   GFRAA 33 (L) 05/04/2018 04:59 AM    BMP Latest Ref Rng & Units 04/21/2020 04/16/2019 05/10/2018  Glucose 70 - 99 mg/dL 96 98 94  BUN 6 - 23 mg/dL 24(H) 21 15  Creatinine 0.40 - 1.20 mg/dL 1.28(H) 1.22(H) 1.08  Sodium 135 - 145 mEq/L 142 140 140  Potassium 3.5 - 5.1 mEq/L 4.3 4.1 4.0  Chloride 96 - 112 mEq/L 105 103 105  CO2 19 - 32 mEq/L 29 30 28   Calcium 8.4 - 10.5 mg/dL 9.1 9.6 9.8     Current antihypertensive regimen:   Losartan 25 mg - 1 tablet daily   Patient verbally confirms she is taking the above medications as directed. Yes  Spoke with Daughter and she confirmed this medication  How often are you checking your Blood Pressure? infrequently    Current home BP readings:  None available    Per the daughter , no readings recently, the patient has been feeling well, no problems with medications   What recent interventions/DTPs have been made by any provider to improve Blood Pressure control since last CPP Visit:  Daughter asked to begin to monitor BP home readings.  Any recent hospitalizations or ED visits since last visit with CPP? No  What  diet changes have been made to improve Blood Pressure Control?   None identified  What exercise is being done to improve your Blood Pressure Control?   None identified  Adherence Review: Is the patient currently on ACE/ARB medication? Yes Does the patient have >5 day gap between last estimated fill dates? No gaps    Star Rating Drugs:  Medication:  Last Fill: Day Supply Losartan 25mg . 10/14/2020 90ds  Follow-Up:  Pharmacist Review  Debbora Dus, CPP notified  Avel Sensor, [place] Assistant [phone]   I have reviewed the care management and care coordination activities outlined in this encounter and I am certifying that I agree with the content of this note. Home BP  monitoring not required if asymptomatic given age.  Debbora Dus, PharmD Clinical Pharmacist [facility] at [facility] [phone]

## 2020-10-21 ENCOUNTER — Other Ambulatory Visit: Payer: Self-pay

## 2020-10-21 ENCOUNTER — Encounter: Payer: Self-pay | Admitting: Podiatry

## 2020-10-21 ENCOUNTER — Ambulatory Visit (INDEPENDENT_AMBULATORY_CARE_PROVIDER_SITE_OTHER): Payer: Medicare Other | Admitting: Podiatry

## 2020-10-21 DIAGNOSIS — B351 Tinea unguium: Secondary | ICD-10-CM | POA: Diagnosis not present

## 2020-10-21 DIAGNOSIS — M79674 Pain in right toe(s): Secondary | ICD-10-CM | POA: Diagnosis not present

## 2020-10-21 DIAGNOSIS — M79675 Pain in left toe(s): Secondary | ICD-10-CM | POA: Diagnosis not present

## 2020-10-21 NOTE — Progress Notes (Signed)
This patient returns to my office for at risk foot care.  This patient requires this care by a professional since this patient will be at risk due to having poor circulation.  This patient is unable to cut nails herself since the patient cannot reach her nails.These nails are painful walking and wearing shoes.  This patient presents for at risk foot care today.  General Appearance  Alert, conversant and in no acute stress.  Vascular  Dorsalis pedis and posterior tibial  pulses are  Weakly palpable  bilaterally.  Capillary return is within normal limits  bilaterally. Cold feet  bilaterally.  Neurologic  Senn-Weinstein monofilament wire test within normal limits  bilaterally. Muscle power within normal limits bilaterally.  Nails Thick disfigured discolored nails with subungual debris  from hallux to fifth toes bilaterally. No evidence of bacterial infection or drainage bilaterally.  Orthopedic  No limitations of motion  feet .  No crepitus or effusions noted.  No bony pathology or digital deformities noted.  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  HAV with overlapping second toe right foot.  Skin  normotropic skin with no porokeratosis noted bilaterally.  No signs of infections or ulcers noted.     Onychomycosis  Pain in right toes  Pain in left toes  Consent was obtained for treatment procedures.   Mechanical debridement of nails 1-5  bilaterally performed with a nail nipper.  Filed with dremel without incident.    Return office visit     3 months                 Told patient to return for periodic foot care and evaluation due to potential at risk complications.   Angeliah Wisdom DPM  

## 2020-10-27 ENCOUNTER — Other Ambulatory Visit: Payer: Self-pay

## 2020-10-27 ENCOUNTER — Ambulatory Visit (INDEPENDENT_AMBULATORY_CARE_PROVIDER_SITE_OTHER): Payer: Medicare Other | Admitting: Dermatology

## 2020-10-27 DIAGNOSIS — L570 Actinic keratosis: Secondary | ICD-10-CM | POA: Diagnosis not present

## 2020-10-27 DIAGNOSIS — L820 Inflamed seborrheic keratosis: Secondary | ICD-10-CM | POA: Diagnosis not present

## 2020-10-27 DIAGNOSIS — L578 Other skin changes due to chronic exposure to nonionizing radiation: Secondary | ICD-10-CM | POA: Diagnosis not present

## 2020-10-27 DIAGNOSIS — L821 Other seborrheic keratosis: Secondary | ICD-10-CM

## 2020-10-27 NOTE — Progress Notes (Signed)
   Follow-Up Visit   Subjective  Nicole Downs is a 87 y.o. female who presents for the following: Actinic Keratosis (Of the face and chest - check for new or persistent skin lesions).  The following portions of the chart were reviewed this encounter and updated as appropriate:   Tobacco  Allergies  Meds  Problems  Med Hx  Surg Hx  Fam Hx     Review of Systems:  No other skin or systemic complaints except as noted in HPI or Assessment and Plan.  Objective  Well appearing patient in no apparent distress; mood and affect are within normal limits.  A focused examination was performed including the face, trunk, and extremities. Relevant physical exam findings are noted in the Assessment and Plan.  Objective  Face and chest x 16 (16): Erythematous thin papules/macules with gritty scale.   Objective  Face x 5 (5): Erythematous keratotic or waxy stuck-on papule or plaque.    Assessment & Plan  AK (actinic keratosis) (16) Face and chest x 16  Destruction of lesion - Face and chest x 16 Complexity: simple   Destruction method: cryotherapy   Informed consent: discussed and consent obtained   Timeout:  patient name, date of birth, surgical site, and procedure verified Lesion destroyed using liquid nitrogen: Yes   Region frozen until ice ball extended beyond lesion: Yes   Outcome: patient tolerated procedure well with no complications   Post-procedure details: wound care instructions given    Inflamed seborrheic keratosis (5) Face x 5  Destruction of lesion - Face x 5 Complexity: simple   Destruction method: cryotherapy   Informed consent: discussed and consent obtained   Timeout:  patient name, date of birth, surgical site, and procedure verified Lesion destroyed using liquid nitrogen: Yes   Region frozen until ice ball extended beyond lesion: Yes   Outcome: patient tolerated procedure well with no complications   Post-procedure details: wound care instructions  given    Actinic Damage - chronic, secondary to cumulative UV radiation exposure/sun exposure over time - diffuse scaly erythematous macules with underlying dyspigmentation - Recommend daily broad spectrum sunscreen SPF 30+ to sun-exposed areas, reapply every 2 hours as needed.  - Recommend staying in the shade or wearing long sleeves, sun glasses (UVA+UVB protection) and wide brim hats (4-inch brim around the entire circumference of the hat). - Call for new or changing lesions.  Seborrheic Keratoses - Stuck-on, waxy, tan-brown papules and/or plaques  - Benign-appearing - Discussed benign etiology and prognosis. - Observe - Call for any changes  Return for 4-6 mths for AK, ISK follow up .  Luther Redo, CMA, am acting as scribe for Sarina Ser, MD .  Documentation: I have reviewed the above documentation for accuracy and completeness, and I agree with the above.  Sarina Ser, MD

## 2020-10-27 NOTE — Patient Instructions (Signed)

## 2020-11-02 ENCOUNTER — Encounter: Payer: Self-pay | Admitting: Dermatology

## 2020-11-17 ENCOUNTER — Other Ambulatory Visit: Payer: Self-pay | Admitting: Primary Care

## 2020-11-17 DIAGNOSIS — F331 Major depressive disorder, recurrent, moderate: Secondary | ICD-10-CM

## 2020-12-13 ENCOUNTER — Other Ambulatory Visit: Payer: Self-pay | Admitting: Primary Care

## 2020-12-13 DIAGNOSIS — G250 Essential tremor: Secondary | ICD-10-CM

## 2020-12-31 ENCOUNTER — Other Ambulatory Visit: Payer: Self-pay | Admitting: Primary Care

## 2021-01-19 ENCOUNTER — Telehealth: Payer: Self-pay

## 2021-01-19 NOTE — Chronic Care Management (AMB) (Addendum)
Chronic Care Management Pharmacy Assistant   Name: Nicole Downs  MRN: [id number] DOB: 09-Sep-1923  Reason for Encounter: General Adherence    Recent office visits:  None since last CCM contact  Recent consult visits:  10/27/20 - Dermatology - Patient presented for lesions on face and chest. Cryotherapy completed. No medication changes.  Hospital visits:  None in previous 6 months  Medications: Outpatient Encounter Medications as of 01/19/2021  Medication Sig   BETIMOL 0.5 % ophthalmic solution Apply 1 drop to eye 2 (two) times daily.    Calcium Carbonate (CALCIUM 600 PO) Take 600 mg by mouth 2 (two) times daily.   Cholecalciferol (VITAMIN D3) 10000 units TABS Take 1,000 mg by mouth 2 (two) times daily.   donepezil (ARICEPT) 5 MG tablet TAKE 1 TABLET BY MOUTH EVERYDAY AT BEDTIME   levobunolol (BETAGAN) 0.5 % ophthalmic solution Place 1 drop into both eyes 2 (two) times daily.   levothyroxine (SYNTHROID) 25 MCG tablet TAKE 1 TABLET BY MOUTH DAILY WITHOUT FOOD, WATER ONLY, DO NOT EAT WITHIN 30 MINUTES AFTER TAKING   losartan (COZAAR) 25 MG tablet TAKE 1 TABLET BY MOUTH EVERY DAY FOR BLOOD PRESSURE   Melatonin-Pyridoxine (MELATIN PO) Take 1 tablet by mouth at bedtime as needed (sleep).   mupirocin ointment (BACTROBAN) 2 % Apply to aa L calf BID until healed   propranolol (INDERAL) 80 MG tablet TAKE 2 TABLETS BY MOUTH TWICE A DAY FOR TREMOR   sertraline (ZOLOFT) 25 MG tablet TAKE 1 TABLET BY MOUTH AT BEDTIME FOR DEPRESSION.   No facility-administered encounter medications on file as of 01/19/2021.   Contacted [name] on 01/26/2021 for general disease state and medication adherence call. The daughter [name] spoke on the patients behalf.  Patient is not > 5 days past due for refill on the following medications per chart history:  Star Medications: Medication Name/mg Last Fill Days Supply Losartan'25mg'$    01/12/21 90   What concerns do you have about your medications? The  daughter reports she has weaned off propanolol due to low heart rate (50's)and now doing better (65-70's).  The patient denies side effects with her medications.   How often do you forget or accidentally miss a dose? Rarely  The daughter reports Sivana has dropped a pill next to the bed in evening and missed dose on occasion  Do you use a pillbox? Yes The daughter reports she puts medications in organizer pillbox 1-2 weeks at a time  Are you having any problems getting your medications from your pharmacy? No  [facility]  Has the cost of your medications been a concern? No  Since last visit with CPP, the following interventions have been made: reports propranolol d/c due to low HR  The patient has not had an ED visit since last contact.   The patient denies problems with their health.  The patients daughter provided some recent home BPs Morning before medications  01/12/21-138/76  60P 01/18/21- 132/83  65P 01/20/21- 99/68    73P 01/23/21- 139/86  62P    she denies  concerns or questions for Debbora Dus, Pharm. D at this time  Counseled patient on:  [place] job taking medications, Importance of taking medication daily without missed doses, Benefits of adherence packaging or a pillbox, and Access to CCM team for any cost, medication or pharmacy concerns.   Care Gaps: Last annual wellness visit:04/20/20  Next PCP appointment on 04/29/2021  Debbora Dus, CPP notified  Avel Sensor, [place] Assistant [phone]  I have reviewed the care management and care coordination activities outlined in this encounter and I am certifying that I agree with the content of this note. No further action required.  Debbora Dus, PharmD Clinical Pharmacist [facility] at [facility] [phone]

## 2021-01-24 DIAGNOSIS — Z23 Encounter for immunization: Secondary | ICD-10-CM | POA: Diagnosis not present

## 2021-01-27 ENCOUNTER — Other Ambulatory Visit: Payer: Self-pay

## 2021-01-27 ENCOUNTER — Ambulatory Visit (INDEPENDENT_AMBULATORY_CARE_PROVIDER_SITE_OTHER): Payer: Medicare Other | Admitting: Podiatry

## 2021-01-27 ENCOUNTER — Encounter: Payer: Self-pay | Admitting: Podiatry

## 2021-01-27 DIAGNOSIS — B351 Tinea unguium: Secondary | ICD-10-CM | POA: Diagnosis not present

## 2021-01-27 DIAGNOSIS — M79674 Pain in right toe(s): Secondary | ICD-10-CM | POA: Diagnosis not present

## 2021-01-27 DIAGNOSIS — M79675 Pain in left toe(s): Secondary | ICD-10-CM | POA: Diagnosis not present

## 2021-01-27 DIAGNOSIS — M2041 Other hammer toe(s) (acquired), right foot: Secondary | ICD-10-CM

## 2021-01-27 DIAGNOSIS — R0989 Other specified symptoms and signs involving the circulatory and respiratory systems: Secondary | ICD-10-CM

## 2021-01-27 NOTE — Progress Notes (Signed)
This patient returns to my office for at risk foot care.  This patient requires this care by a professional since this patient will be at risk due to having poor circulation.  This patient is unable to cut nails herself since the patient cannot reach her nails.These nails are painful walking and wearing shoes. She presents to the office with her daughter. This patient presents for at risk foot care today.  General Appearance  Alert, conversant and in no acute stress.  Vascular  Dorsalis pedis and posterior tibial  pulses are  Weakly palpable  bilaterally.  Capillary return is within normal limits  bilaterally. Cold feet  bilaterally.  Neurologic  Senn-Weinstein monofilament wire test within normal limits  bilaterally. Muscle power within normal limits bilaterally.  Nails Thick disfigured discolored nails with subungual debris  from hallux to fifth toes bilaterally. No evidence of bacterial infection or drainage bilaterally.  Orthopedic  No limitations of motion  feet .  No crepitus or effusions noted.  No bony pathology or digital deformities noted.  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  HAV with overlapping second toe right foot.  Skin  normotropic skin with no porokeratosis noted bilaterally.  No signs of infections or ulcers noted.     Onychomycosis  Pain in right toes  Pain in left toes  Consent was obtained for treatment procedures.   Mechanical debridement of nails 1-5  bilaterally performed with a nail nipper.  Filed with dremel without incident.    Return office visit     4  months                 Told patient to return for periodic foot care and evaluation due to potential at risk complications.   Gardiner Barefoot DPM

## 2021-02-01 ENCOUNTER — Other Ambulatory Visit: Payer: Self-pay

## 2021-02-01 ENCOUNTER — Encounter: Payer: Self-pay | Admitting: Family Medicine

## 2021-02-01 ENCOUNTER — Ambulatory Visit (INDEPENDENT_AMBULATORY_CARE_PROVIDER_SITE_OTHER): Payer: Medicare Other | Admitting: Family Medicine

## 2021-02-01 DIAGNOSIS — S51812A Laceration without foreign body of left forearm, initial encounter: Secondary | ICD-10-CM | POA: Insufficient documentation

## 2021-02-01 NOTE — Progress Notes (Signed)
Patient ID: Nicole Downs, female    DOB: 05/06/1924, 87 y.o.   MRN: [id number]  This visit was conducted in person.  BP 140/72   Pulse 82   Temp 97.7 F (36.5 C) (Temporal)   Ht '5\' 1"'$  (1.549 m)   Wt 115 lb 8 oz (52.4 kg)   SpO2 92%   BMI 21.82 kg/m    CC: forearm abrasion Subjective:   HPI: Nicole Downs is a 87 y.o. female presenting on 02/01/2021 for Abrasion (C/o abrasion to left forearm caused by dog on 01/27/21.  Wound does not seem to be healing. Has applied Neosporin.  Pt accompanied by daughter, Nicole Downs- temp 98.1. )   DOI: 01/27/2021 While in the car their dog jumped over her and scratched her arm causing V shaped tear.  Treating with neosporin and daily gauze dressing changes however each change causes new trauma to area.   Denies fevers/chills, nausea, streaking redness.   Last Tdap 2020.      Relevant past medical, surgical, family and social history reviewed and updated as indicated. Interim medical history since our last visit reviewed. Allergies and medications reviewed and updated. Outpatient Medications Prior to Visit  Medication Sig Dispense Refill   BETIMOL 0.5 % ophthalmic solution Apply 1 drop to eye 2 (two) times daily.      Calcium Carbonate (CALCIUM 600 PO) Take 600 mg by mouth 2 (two) times daily.     Cholecalciferol (VITAMIN D3) 10000 units TABS Take 1,000 mg by mouth 2 (two) times daily.     donepezil (ARICEPT) 5 MG tablet TAKE 1 TABLET BY MOUTH EVERYDAY AT BEDTIME 90 tablet 0   levobunolol (BETAGAN) 0.5 % ophthalmic solution Place 1 drop into both eyes 2 (two) times daily.     levothyroxine (SYNTHROID) 25 MCG tablet TAKE 1 TABLET BY MOUTH DAILY WITHOUT FOOD, WATER ONLY, DO NOT EAT WITHIN 30 MINUTES AFTER TAKING 90 tablet 0   losartan (COZAAR) 25 MG tablet TAKE 1 TABLET BY MOUTH EVERY DAY FOR BLOOD PRESSURE 90 tablet 1   Melatonin-Pyridoxine (MELATIN PO) Take 1 tablet by mouth at bedtime as needed (sleep).     sertraline (ZOLOFT) 25 MG  tablet TAKE 1 TABLET BY MOUTH AT BEDTIME FOR DEPRESSION. 90 tablet 1   mupirocin ointment (BACTROBAN) 2 % Apply to aa L calf BID until healed 22 g 0   propranolol (INDERAL) 80 MG tablet TAKE 2 TABLETS BY MOUTH TWICE A DAY FOR TREMOR 360 tablet 1   No facility-administered medications prior to visit.     Per HPI unless specifically indicated in ROS section below Review of Systems  Objective:  BP 140/72   Pulse 82   Temp 97.7 F (36.5 C) (Temporal)   Ht '5\' 1"'$  (1.549 m)   Wt 115 lb 8 oz (52.4 kg)   SpO2 92%   BMI 21.82 kg/m   Wt Readings from Last 3 Encounters:  02/01/21 115 lb 8 oz (52.4 kg)  04/20/20 122 lb (55.3 kg)  04/16/19 127 lb (57.6 kg)      Physical Exam Vitals and nursing note reviewed.  Constitutional:      Appearance: Normal appearance.     Comments: Ambulates with walker  Musculoskeletal:     Right lower leg: No edema.     Left lower leg: No edema.     Comments: Mild edema of left forearm  Skin:    General: Skin is warm and dry.     Findings: Laceration present.  Comments: 1.5 x 3cm triangular skin tear to left lateral forearm without surrounding erythema or drainage  Neurological:     Mental Status: She is alert.  Psychiatric:        Mood and Affect: Mood normal.        Behavior: Behavior normal.      Assessment & Plan:  This visit occurred during the SARS-CoV-2 public health emergency.  Safety protocols were in place, including screening questions prior to the visit, additional usage of staff PPE, and extensive cleaning of exam room while observing appropriate contact time as indicated for disinfecting solutions.   Problem List Items Addressed This Visit     Skin tear of forearm without complication, left, initial encounter    Dressed with abx ointment and tegaderm wrap, home care instructions provided. Red flags to seek further care reviewed.  Tdap UTD.         No orders of the defined types were placed in this encounter.  No orders  of the defined types were placed in this encounter.   Patient Instructions  Continue dressing changes every 1-2 days with tegaderm and antibiotic ointment. Watch for streaking redness or if not improving as expected, let us know  Follow up plan: No follow-ups on file.  Ria Bush, MD

## 2021-02-01 NOTE — Assessment & Plan Note (Signed)
Dressed with abx ointment and tegaderm wrap, home care instructions provided. Red flags to seek further care reviewed.  Tdap UTD.

## 2021-02-01 NOTE — Patient Instructions (Signed)
Continue dressing changes every 1-2 days with tegaderm and antibiotic ointment. Watch for streaking redness or if not improving as expected, let us know

## 2021-03-12 ENCOUNTER — Emergency Department (HOSPITAL_COMMUNITY): Payer: Medicare Other

## 2021-03-12 ENCOUNTER — Emergency Department (HOSPITAL_COMMUNITY)
Admission: EM | Admit: 2021-03-12 | Discharge: 2021-03-12 | Disposition: A | Discharge status: Home / Self Care | Payer: Medicare Other | Attending: Emergency Medicine | Admitting: Emergency Medicine

## 2021-03-12 ENCOUNTER — Encounter (HOSPITAL_COMMUNITY): Payer: Self-pay | Admitting: Emergency Medicine

## 2021-03-12 ENCOUNTER — Other Ambulatory Visit: Payer: Self-pay

## 2021-03-12 DIAGNOSIS — R4182 Altered mental status, unspecified: Secondary | ICD-10-CM | POA: Diagnosis not present

## 2021-03-12 DIAGNOSIS — Z79899 Other long term (current) drug therapy: Secondary | ICD-10-CM | POA: Diagnosis not present

## 2021-03-12 DIAGNOSIS — N390 Urinary tract infection, site not specified: Secondary | ICD-10-CM | POA: Insufficient documentation

## 2021-03-12 DIAGNOSIS — N3000 Acute cystitis without hematuria: Secondary | ICD-10-CM

## 2021-03-12 DIAGNOSIS — I10 Essential (primary) hypertension: Secondary | ICD-10-CM | POA: Diagnosis not present

## 2021-03-12 DIAGNOSIS — R410 Disorientation, unspecified: Secondary | ICD-10-CM | POA: Diagnosis not present

## 2021-03-12 DIAGNOSIS — F0390 Unspecified dementia without behavioral disturbance: Secondary | ICD-10-CM | POA: Diagnosis not present

## 2021-03-12 DIAGNOSIS — E039 Hypothyroidism, unspecified: Secondary | ICD-10-CM | POA: Diagnosis not present

## 2021-03-12 DIAGNOSIS — Z85828 Personal history of other malignant neoplasm of skin: Secondary | ICD-10-CM | POA: Insufficient documentation

## 2021-03-12 LAB — CBC WITH DIFFERENTIAL/PLATELET
Abs Immature Granulocytes: 0.02 10*3/uL (ref 0.00–0.07)
Basophils Absolute: 0 10*3/uL (ref 0.0–0.1)
Basophils Relative: 0 %
Eosinophils Absolute: 0.1 10*3/uL (ref 0.0–0.5)
Eosinophils Relative: 1 %
HCT: 39.7 % (ref 36.0–46.0)
Hemoglobin: 13.5 g/dL (ref 12.0–15.0)
Immature Granulocytes: 0 %
Lymphocytes Relative: 23 %
Lymphs Abs: 2.3 10*3/uL (ref 0.7–4.0)
MCH: 32.5 pg (ref 26.0–34.0)
MCHC: 34 g/dL (ref 30.0–36.0)
MCV: 95.7 fL (ref 80.0–100.0)
Monocytes Absolute: 0.9 10*3/uL (ref 0.1–1.0)
Monocytes Relative: 9 %
Neutro Abs: 6.7 10*3/uL (ref 1.7–7.7)
Neutrophils Relative %: 67 %
Platelets: 262 10*3/uL (ref 150–400)
RBC: 4.15 MIL/uL (ref 3.87–5.11)
RDW: 12.6 % (ref 11.5–15.5)
WBC: 10 10*3/uL (ref 4.0–10.5)
nRBC: 0 % (ref 0.0–0.2)

## 2021-03-12 LAB — COMPREHENSIVE METABOLIC PANEL
ALT: 11 U/L (ref 0–44)
AST: 20 U/L (ref 15–41)
Albumin: 3.7 g/dL (ref 3.5–5.0)
Alkaline Phosphatase: 55 U/L (ref 38–126)
Anion gap: 8 (ref 5–15)
BUN: 17 mg/dL (ref 8–23)
CO2: 27 mmol/L (ref 22–32)
Calcium: 10 mg/dL (ref 8.9–10.3)
Chloride: 104 mmol/L (ref 98–111)
Creatinine, Ser: 1.24 mg/dL — ABNORMAL HIGH (ref 0.44–1.00)
GFR, Estimated: 40 mL/min — ABNORMAL LOW (ref >60)
Glucose, Bld: 105 mg/dL — ABNORMAL HIGH (ref 70–99)
Potassium: 4 mmol/L (ref 3.5–5.1)
Sodium: 139 mmol/L (ref 135–145)
Total Bilirubin: 0.8 mg/dL (ref 0.3–1.2)
Total Protein: 7.4 g/dL (ref 6.5–8.1)

## 2021-03-12 LAB — URINALYSIS, COMPLETE (UACMP) WITH MICROSCOPIC
Bilirubin Urine: NEGATIVE
Glucose, UA: NEGATIVE mg/dL
Hgb urine dipstick: NEGATIVE
Ketones, ur: NEGATIVE mg/dL
Nitrite: POSITIVE — AB
Protein, ur: NEGATIVE mg/dL
Specific Gravity, Urine: 1.014 (ref 1.005–1.030)
pH: 5 (ref 5.0–8.0)

## 2021-03-12 LAB — AMMONIA: Ammonia: 12 umol/L (ref 9–35)

## 2021-03-12 LAB — LACTIC ACID, PLASMA: Lactic Acid, Venous: 1.1 mmol/L (ref 0.5–1.9)

## 2021-03-12 MED ORDER — CEPHALEXIN 500 MG PO CAPS
500.0000 mg | ORAL_CAPSULE | Freq: Four times a day (QID) | ORAL | 0 refills | Status: DC
Start: 2021-03-12 — End: 2021-04-06

## 2021-03-12 MED ORDER — SODIUM CHLORIDE 0.9 % IV SOLN
1.0000 g | Freq: Once | INTRAVENOUS | Status: AC
  Administered 2021-03-12: 1 g via INTRAVENOUS
  Filled 2021-03-12: qty 10

## 2021-03-12 NOTE — ED Triage Notes (Signed)
Pt BIB daughter who reports around 10am today, pt began to act differently more confused than her baseline, attempting to leave the house, stating she needs to go home (has been living with daughter), has had some increased urination. No falls that family is aware of.

## 2021-03-12 NOTE — ED Notes (Signed)
Pt's daughter reports a drastic change in orientation since abut 10:30am this morning.  She has been talking to people who passed away and has escaped their home at least 4 times today.  Pt is oriented to self and daughter.

## 2021-03-12 NOTE — ED Provider Notes (Signed)
[facility] EMERGENCY DEPARTMENT Provider Note   CSN: [id number] Arrival date & time: 03/12/21  1713     History Chief Complaint  Patient presents with   Altered Mental Status    Nicole Downs is a 87 y.o. female.  She has a history of dementia and lives with her daughter.  Level 5 caveat secondary to acute confusion.  Patient states since around 10:00 this morning she has been more argumentative and more confused.  Think she has doctor's appointment when she does not.  She is also eloped from the house a few times today with her walker.  Patient herself denies any complaints.  Daughter also reports that her urine has had a strong smell to it.  The history is provided by the patient and a relative.  Altered Mental Status Presenting symptoms: confusion   Severity:  Moderate Most recent episode:  Today Episode history:  Continuous Timing:  Constant Progression:  Unchanged Chronicity:  New Context: not head injury, taking medications as prescribed, not nursing home resident and not recent change in medication   Associated symptoms: fever   Associated symptoms: no abdominal pain and no headaches       Past Medical History:  Diagnosis Date   Actinic keratosis    Acute confusion    Arthritis    Basal cell carcinoma 11/01/2015   L lat ankle   Basal cell carcinoma 12/13/2015   L dorsum foot   Basal cell carcinoma 09/12/2017   nasal root, R nasal bridge, L nasal bridge, R mandible infra auricular   Basal cell carcinoma 10/24/2017   R lateral foot   Basal cell carcinoma 12/05/2017   L dorsum foot   Basal cell carcinoma 11/27/2018   R of midline mid forehead   Basal cell carcinoma 06/25/2019   nasal bridge   Basal cell carcinoma 12/01/2019   Junction of right mandible and earlobe. Nodular pattern.   Emphysema lung (HCC)    Glaucoma    Hypertension    Hyponatremia    Hypothyroidism    Memory loss    Poor peripheral circulation    Squamous cell  carcinoma of skin 09/20/2015   R prox mandible/in situ   Squamous cell carcinoma of skin 02/01/2016   nose   Squamous cell carcinoma of skin 10/24/2017   R lateral knee/in situ, R lat ankle/in situ   Squamous cell carcinoma of skin 12/05/2017   L medial calf/in situ   Squamous cell carcinoma of skin 12/01/2019   Right neck sup. SCCis, hypertrophic   Squamous cell carcinoma of skin 12/01/2019   Right neck inf.  SCCis, hypertrophic   Squamous cell carcinoma of skin 03/02/2020   Left upper arm, proximal. SCCis   Squamous cell carcinoma of skin 03/02/2020   Left upper arm, distal. SCCis   Tremors of nervous system     Patient Active Problem List   Diagnosis Date Noted   Skin tear of forearm without complication, left, initial encounter 02/01/2021   Pain due to onychomycosis of toenails of both feet 10/20/2019   Glaucoma 04/16/2019   Overgrown toenails 04/16/2019   Anemia 04/16/2019   Recurrent UTI 06/07/2018   Diverticulitis 05/02/2018   Moderate episode of recurrent major depressive disorder ([place]) 04/10/2018   Pulmonary emphysema ([place]) 03/29/2017   Memory loss 08/09/2016   Essential tremor 04/10/2016   Vitamin D deficiency 10/08/2015   Lower extremity edema 10/08/2015   Essential hypertension 06/24/2015   Hypothyroidism 06/24/2015   Dementia ([place]) 06/24/2015  Poor peripheral circulation 06/24/2015    Past Surgical History:  Procedure Laterality Date   BREAST BIOPSY       OB History   No obstetric history on file.     Family History  Problem Relation Age of Onset   Heart attack Mother    Other Father        unsure - she was only 54 years old when he died    Social History   Tobacco Use   Smoking status: Never   Smokeless tobacco: Never  Vaping Use   Vaping Use: Never used  Substance Use Topics   Alcohol use: No    Alcohol/week: 0.0 standard drinks   Drug use: No    Home Medications Prior to Admission medications   Medication Sig Start Date End Date  Taking? Authorizing Provider  BETIMOL 0.5 % ophthalmic solution Apply 1 drop to eye 2 (two) times daily.  05/26/19   [provider]  Calcium Carbonate (CALCIUM 600 PO) Take 600 mg by mouth 2 (two) times daily.    [provider]  Cholecalciferol (VITAMIN D3) 10000 units TABS Take 1,000 mg by mouth 2 (two) times daily.    [provider]  donepezil (ARICEPT) 5 MG tablet TAKE 1 TABLET BY MOUTH EVERYDAY AT BEDTIME 06/21/20   Pleas Koch, NP  levobunolol (BETAGAN) 0.5 % ophthalmic solution Place 1 drop into both eyes 2 (two) times daily.    [provider]  levothyroxine (SYNTHROID) 25 MCG tablet TAKE 1 TABLET BY MOUTH DAILY WITHOUT FOOD, WATER ONLY, DO NOT EAT WITHIN 30 MINUTES AFTER TAKING 12/31/20   Pleas Koch, NP  losartan (COZAAR) 25 MG tablet TAKE 1 TABLET BY MOUTH EVERY DAY FOR BLOOD PRESSURE 10/14/20   Pleas Koch, NP  Melatonin-Pyridoxine (MELATIN PO) Take 1 tablet by mouth at bedtime as needed (sleep).    [provider]  sertraline (ZOLOFT) 25 MG tablet TAKE 1 TABLET BY MOUTH AT BEDTIME FOR DEPRESSION. 11/17/20   Pleas Koch, NP    Allergies    Augmentin [amoxicillin-pot clavulanate] and Amlodipine  Review of Systems   Review of Systems  Unable to perform ROS: Dementia  Constitutional:  Positive for fever.  Respiratory:  Negative for cough.   Cardiovascular:  Negative for chest pain.  Gastrointestinal:  Negative for abdominal pain.  Neurological:  Negative for headaches.  Psychiatric/Behavioral:  Positive for confusion.    Physical Exam Updated Vital Signs BP (!) 149/97   Pulse 87   Temp 99.1 F (37.3 C)   Resp 19   SpO2 96%   Physical Exam Vitals and nursing note reviewed.  Constitutional:      General: She is not in acute distress.    Appearance: Normal appearance. She is well-developed.  HENT:     Head: Normocephalic and atraumatic.  Eyes:     Conjunctiva/sclera: Conjunctivae normal.   Cardiovascular:     Rate and Rhythm: Normal rate and regular rhythm.     Heart sounds: No murmur heard. Pulmonary:     Effort: Pulmonary effort is normal. No respiratory distress.     Breath sounds: Normal breath sounds.  Abdominal:     Palpations: Abdomen is soft.     Tenderness: There is no abdominal tenderness.  Musculoskeletal:        General: No deformity or signs of injury. Normal range of motion.     Cervical back: Neck supple.  Skin:    General: Skin is warm and dry.  Neurological:     General: No focal deficit present.     Mental Status: She is alert. She is disoriented.     Comments: She is awake and alert following commands with all 4 extremities.  She has some baseline head tremor.  She is not oriented to time or place.    ED Results / Procedures / Treatments   Labs (all labs ordered are listed, but only abnormal results are displayed) Labs Reviewed  COMPREHENSIVE METABOLIC PANEL - Abnormal; Notable for the following components:      Result Value   Glucose, Bld 105 (*)    Creatinine, Ser 1.24 (*)    GFR, Estimated 40 (*)    All other components within normal limits  URINALYSIS, COMPLETE (UACMP) WITH MICROSCOPIC - Abnormal; Notable for the following components:   APPearance HAZY (*)    Nitrite POSITIVE (*)    Leukocytes,Ua LARGE (*)    Bacteria, UA MANY (*)    All other components within normal limits  URINE CULTURE  CBC WITH DIFFERENTIAL/PLATELET  AMMONIA  LACTIC ACID, PLASMA    EKG None  Radiology CT HEAD WO CONTRAST  Result Date: 03/12/2021 CLINICAL DATA:  Mental status change, unknown cause EXAM: CT HEAD WITHOUT CONTRAST TECHNIQUE: Contiguous axial images were obtained from the base of the skull through the vertex without intravenous contrast. COMPARISON:  None. FINDINGS: Evaluation is limited secondary to motion and patient inability to cooperate with exam Brain: No definitive evidence of acute infarction, large volume hemorrhage, hydrocephalus,  extra-axial collection or mass lesion/mass effect. Global parenchymal volume loss. Periventricular white matter hypodensities consistent with sequela of chronic microvascular ischemic disease. Vascular: Scattered vascular calcifications. Skull: No definitive acute fracture. Sinuses/Orbits: No acute finding. Other: None. IMPRESSION: Evaluation is limited secondary to motion and patient inability to cooperate with exam. Within these limitations, no acute intracranial abnormality. Evaluation for small volume hemorrhage is limited due to motion and corresponding artifact. Electronically Signed   By: Valentino Saxon M.D.   On: 03/12/2021 19:00    Procedures Procedures   Medications Ordered in ED Medications  cefTRIAXone (ROCEPHIN) 1 g in sodium chloride 0.9 % 100 mL IVPB (0 g Intravenous Stopped 03/12/21 2340)    ED Course  I have reviewed the triage vital signs and the nursing notes.  Pertinent labs & imaging results that were available during my care of the patient were reviewed by me and considered in my medical decision making (see chart for details).    MDM Rules/Calculators/A&P                          This patient complains of increased confusion and agitation; this involves an extensive number of treatment Options and is a complaint that carries with it a high risk of complications and Morbidity. The differential includes dehydration, infection, stroke, bleed  I ordered, reviewed and interpreted labs, which included CBC with normal white count normal hemoglobin, chemistries normal mildly elevated creatinine stable from priors, urinalysis consistent with infection 21-50 white blood cells nitrite positive lactate not elevated, ammonia normal I ordered medication IV antibiotics I ordered imaging studies which included head CT and I independently    visualized and interpreted imaging which showed no acute findings Additional history obtained from patient's daughter Previous records  obtained and reviewed in epic no recent admissions  After the interventions stated above, I reevaluated the patient and found patient to be hemodynamically stable although blood pressure is elevated, patient is slightly  restless here.  Reviewed results of work-up with her.  Daughter is comfortable taking patient home.  Will cover with antibiotics.  Recommended close follow-up with her primary care doctor.  Return instructions discussed   Final Clinical Impression(s) / ED Diagnoses Final diagnoses:  Confusion  Lower urinary tract infectious disease    Rx / DC Orders ED Discharge Orders          Ordered    cephALEXin (KEFLEX) 500 MG capsule  4 times daily        03/12/21 2239             Hayden Rasmussen, MD 03/13/21 1001

## 2021-03-12 NOTE — Discharge Instructions (Addendum)
You were seen in the emergency department for increased confusion.  You had a CAT scan of your head that did not show any serious findings.  Your blood work was also not too concerning but your urinalysis looks like signs of infection.  You received an IV dose of antibiotics and are getting a prescription for oral antibiotics to take.  Please follow-up with your primary care doctor.  Return to the emergency department if any worsening or concerning symptoms

## 2021-03-12 NOTE — Telephone Encounter (Signed)
Called patient daughter reviewed all symptoms and changes. She has noticed increased urination in the last 24 hours and that patient is "uncomfortable" but not able to tell her where or how. She has agreed to take patient to ED ( all walk in/ urgent cares are closed for the day) for evaluation and will let our office know if any further questions.

## 2021-03-12 NOTE — ED Provider Notes (Signed)
Emergency Medicine Provider Triage Evaluation Note  Elmire Amrein , a 87 y.o. female  was evaluated in triage.  Pt complains of AMS.  Patient brought in by daughter, history of dementia, states that patient normally sleeps all day and ambulates with a walker.  However, this morning around 10:00 became more confused and hyperactive is now not requiring her walker.  Daughter states she has been taking medications as prescribed with no recent changes.  Review of Systems  Positive: AMS Negative: Fevers, chills, chest pain, shortness of breath, nausea, vomiting, diarrhea  Physical Exam  BP (!) 147/112 (BP Location: Left Arm)   Pulse (!) 102   Temp 99.1 F (37.3 C)   Resp 16   SpO2 98%  Gen:   Awake, no distress  Resp:  Normal effort  MSK:   Moves extremities without difficulty  Other:  Full strength equal and bilateral in all extremities, cerebellar testing normal, pupils equal and reactive, normal neuro exam.  Medical Decision Making  Medically screening exam initiated at 5:51 PM.  Appropriate orders placed.  [name] was informed that the remainder of the evaluation will be completed by another provider, this initial triage assessment does not replace that evaluation, and the importance of remaining in the ED until their evaluation is complete.     Nestor Lewandowsky 03/12/21 1754    Carmin Muskrat, MD 03/12/21 2126

## 2021-03-13 NOTE — Telephone Encounter (Signed)
I spoke with Nicole Downs on 03/12/21, agreed with ED evaluation. Reviewed chart from ED, diagnosed and treated for UTI.

## 2021-03-28 ENCOUNTER — Other Ambulatory Visit: Payer: Self-pay | Admitting: Primary Care

## 2021-03-28 DIAGNOSIS — E039 Hypothyroidism, unspecified: Secondary | ICD-10-CM

## 2021-03-30 ENCOUNTER — Other Ambulatory Visit: Payer: Self-pay

## 2021-03-30 ENCOUNTER — Encounter: Payer: Self-pay | Admitting: Dermatology

## 2021-03-30 ENCOUNTER — Ambulatory Visit (INDEPENDENT_AMBULATORY_CARE_PROVIDER_SITE_OTHER): Payer: Medicare Other | Admitting: Dermatology

## 2021-03-30 DIAGNOSIS — L578 Other skin changes due to chronic exposure to nonionizing radiation: Secondary | ICD-10-CM

## 2021-03-30 DIAGNOSIS — D492 Neoplasm of unspecified behavior of bone, soft tissue, and skin: Secondary | ICD-10-CM

## 2021-03-30 DIAGNOSIS — Z85828 Personal history of other malignant neoplasm of skin: Secondary | ICD-10-CM

## 2021-03-30 DIAGNOSIS — L820 Inflamed seborrheic keratosis: Secondary | ICD-10-CM

## 2021-03-30 DIAGNOSIS — D044 Carcinoma in situ of skin of scalp and neck: Secondary | ICD-10-CM

## 2021-03-30 DIAGNOSIS — D099 Carcinoma in situ, unspecified: Secondary | ICD-10-CM

## 2021-03-30 DIAGNOSIS — L570 Actinic keratosis: Secondary | ICD-10-CM | POA: Diagnosis not present

## 2021-03-30 NOTE — Progress Notes (Signed)
Follow-Up Visit   Subjective  Nicole Downs is a 87 y.o. female who presents for the following: Actinic Keratosis (Face and chest, 24m f/u), ISK f/u (Face, 62m f/u), and check spot (R post ear, 77m, pt picks at). She has multiple areas that are growing and crusting and the patient picks at them constantly. Patient accompanied by daughter who contributes to history.  The following portions of the chart were reviewed this encounter and updated as appropriate:   Tobacco  Allergies  Meds  Problems  Med Hx  Surg Hx  Fam Hx     Review of Systems:  No other skin or systemic complaints except as noted in HPI or Assessment and Plan.  Objective  Well appearing patient in no apparent distress; mood and affect are within normal limits.  A focused examination was performed including face, chest, ears, arms, hands. Relevant physical exam findings are noted in the Assessment and Plan.  Right inf ear on the lobe 2.0 x 1.0cm crusted ulcer     face x 18 Pink scaly macules   R forearm x 1, Erythematous keratotic or waxy stuck-on papule or plaque.   R neck superior x 1  - 1.5 cm, R neck inferior x 1  - 1.1 cm(2) Hyperkeratotic macules   Assessment & Plan   Actinic Damage - chronic, secondary to cumulative UV radiation exposure/sun exposure over time - diffuse scaly erythematous macules with underlying dyspigmentation - Recommend daily broad spectrum sunscreen SPF 30+ to sun-exposed areas, reapply every 2 hours as needed.  - Recommend staying in the shade or wearing long sleeves, sun glasses (UVA+UVB protection) and wide brim hats (4-inch brim around the entire circumference of the hat). - Call for new or changing lesions.   History of Basal Cell Carcinoma of the Skin - No evidence of recurrence today - Recommend regular full body skin exams - Recommend daily broad spectrum sunscreen SPF 30+ to sun-exposed areas, reapply every 2 hours as needed.  - Call if any new or changing  lesions are noted between office visits  -multiple  History of Squamous Cell Carcinoma of the Skin - No evidence of recurrence today - No lymphadenopathy - Recommend regular full body skin exams - Recommend daily broad spectrum sunscreen SPF 30+ to sun-exposed areas, reapply every 2 hours as needed.  - Call if any new or changing lesions are noted between office visits  Neoplasm of skin Right inf ear on the lobe R/O BCC, plan simple excision   AK (actinic keratosis) face x 18 Destruction of lesion - face x 18 Complexity: simple   Destruction method: cryotherapy   Informed consent: discussed and consent obtained   Timeout:  patient name, date of birth, surgical site, and procedure verified Lesion destroyed using liquid nitrogen: Yes   Region frozen until ice ball extended beyond lesion: Yes   Outcome: patient tolerated procedure well with no complications   Post-procedure details: wound care instructions given    Inflamed seborrheic keratosis R forearm x 1, Destruction of lesion - R forearm x 1, Complexity: simple   Destruction method: cryotherapy   Informed consent: discussed and consent obtained   Timeout:  patient name, date of birth, surgical site, and procedure verified Lesion destroyed using liquid nitrogen: Yes   Region frozen until ice ball extended beyond lesion: Yes   Outcome: patient tolerated procedure well with no complications   Post-procedure details: wound care instructions given    Squamous cell carcinoma in situ (SCCIS) (2) R neck  superior x 1,  1.5 cm R neck inferior x 1    1.1cm(2) Destruction of lesion Complexity: simple   Destruction method: cryotherapy   Informed consent: discussed and consent obtained   Timeout:  patient name, date of birth, surgical site, and procedure verified Lesion destroyed using liquid nitrogen: Yes   Region frozen until ice ball extended beyond lesion: Yes   Outcome: patient tolerated procedure well with no complications    Post-procedure details: wound care instructions given    Recurrent SCC IS x 2 Original bx and EDC 12/01/19 LN2 x 2 today  Return for to be scheduled for simple excison R inf ear on the lobe.  I, Othelia Pulling, RMA, am acting as scribe for Sarina Ser, MD . Documentation: I have reviewed the above documentation for accuracy and completeness, and I agree with the above.  Sarina Ser, MD

## 2021-03-30 NOTE — Patient Instructions (Addendum)
If you have any questions or concerns for your doctor, please call our main line at [phone] and press option 4 to reach your doctor's medical assistant. If no one answers, please leave a voicemail as directed and we will return your call as soon as possible. Messages left after 4 pm will be answered the following business day.   You may also send [place] a message via [place]. We typically respond to MyChart messages within 1-2 business days.  For prescription refills, please ask your pharmacy to contact our office. Our fax number is [phone].  If you have an urgent issue when the clinic is closed that cannot wait until the next business day, you can page your doctor at the number below.    Please note that while we do our best to be available for urgent issues outside of office hours, we are not available 24/7.   If you have an urgent issue and are unable to reach [place], you may choose to seek medical care at your doctor's office, retail clinic, urgent care center, or emergency room.  If you have a medical emergency, please immediately call 911 or go to the emergency department.  Pager Numbers  - Dr. Nehemiah Massed: [phone]  - Dr. Laurence Ferrari: [phone]  - Dr. Nicole Kindred: [phone]  In the event of inclement weather, please call our main line at [phone] for an update on the status of any delays or closures.  Dermatology Medication Tips: Please keep the boxes that topical medications come in in order to help keep track of the instructions about where and how to use these. Pharmacies typically print the medication instructions only on the boxes and not directly on the medication tubes.   If your medication is too expensive, please contact our office at [phone] option 4 or send [place] a message through [place].   We are unable to tell what your co-pay for medications will be in advance as this is different depending on your insurance coverage. However, we may be able to find a substitute  medication at lower cost or fill out paperwork to get insurance to cover a needed medication.   If a prior authorization is required to get your medication covered by your insurance company, please allow [place] 1-2 business days to complete this process.  Drug prices often vary depending on where the prescription is filled and some pharmacies may offer cheaper prices.  The website www.goodrx.com contains coupons for medications through different pharmacies. The prices here do not account for what the cost may be with help from insurance (it may be cheaper with your insurance), but the website can give you the price if you did not use any insurance.  - You can print the associated coupon and take it with your prescription to the pharmacy.  - You may also stop by our office during regular business hours and pick up a GoodRx coupon card.  - If you need your prescription sent electronically to a different pharmacy, notify our office through [facility] or by phone at [phone] option 4.     Pre-Operative Instructions  You are scheduled for a surgical procedure at [facility]. We recommend you read the following instructions. If you have any questions or concerns, please call the office at [phone].  Shower and wash the entire body with soap and water the day of your surgery paying special attention to cleansing at and around the planned surgery site.  Avoid aspirin or aspirin containing products at least fourteen (  14) days prior to your surgical procedure and for at least one week (7 Days) after your surgical procedure. If you take aspirin on a regular basis for heart disease or history of stroke or for any other reason, we may recommend you continue taking aspirin but please notify us if you take this on a regular basis. Aspirin can cause more bleeding to occur during surgery as well as prolonged bleeding and bruising after surgery.   Avoid other nonsteroidal pain medications  at least one week prior to surgery and at least one week prior to your surgery. These include medications such as Ibuprofen (Motrin, Advil and Nuprin), Naprosyn, Voltaren, Relafen, etc. If medications are used for therapeutic reasons, please inform us as they can cause increased bleeding or prolonged bleeding during and bruising after surgical procedures.   Please advise [place] if you are taking any "blood thinner" medications such as Coumadin or Dipyridamole or Plavix or similar medications. These cause increased bleeding and prolonged bleeding during procedures and bruising after surgical procedures. We may have to consider discontinuing these medications briefly prior to and shortly after your surgery if safe to do so.   Please inform us of all medications you are currently taking. All medications that are taken regularly should be taken the day of surgery as you always do. Nevertheless, we need to be informed of what medications you are taking prior to surgery to know whether they will affect the procedure or cause any complications.   Please inform us of any medication allergies. Also inform us of whether you have allergies to Latex or rubber products or whether you have had any adverse reaction to Lidocaine or Epinephrine.  Please inform us of any prosthetic or artificial body parts such as artificial heart valve, joint replacements, etc., or similar condition that might require preoperative antibiotics.   We recommend avoidance of alcohol at least two weeks prior to surgery and continued avoidance for at least two weeks after surgery.   We recommend discontinuation of tobacco smoking at least two weeks prior to surgery and continued abstinence for at least two weeks after surgery.  Do not plan strenuous exercise, strenuous work or strenuous lifting for approximately four weeks after your surgery.   We request if you are unable to make your scheduled surgical appointment, please call us at least a  week in advance or as soon as you are aware of a problem so that we can cancel or reschedule the appointment.   You MAY TAKE TYLENOL (acetaminophen) for pain as it is not a blood thinner.   PLEASE PLAN TO BE IN TOWN FOR TWO WEEKS FOLLOWING SURGERY, THIS IS IMPORTANT SO YOU CAN BE CHECKED FOR DRESSING CHANGES, SUTURE REMOVAL AND TO MONITOR FOR POSSIBLE COMPLICATIONS.   Cryotherapy Aftercare  Wash gently with soap and water everyday.   Apply Vaseline and Band-Aid daily until healed.

## 2021-04-04 ENCOUNTER — Other Ambulatory Visit (INDEPENDENT_AMBULATORY_CARE_PROVIDER_SITE_OTHER): Payer: Medicare Other

## 2021-04-04 ENCOUNTER — Other Ambulatory Visit: Payer: Self-pay

## 2021-04-04 ENCOUNTER — Encounter: Payer: Self-pay | Admitting: Dermatology

## 2021-04-04 DIAGNOSIS — N3000 Acute cystitis without hematuria: Secondary | ICD-10-CM

## 2021-04-04 LAB — POC URINALSYSI DIPSTICK (AUTOMATED)
Bilirubin, UA: NEGATIVE
Blood, UA: NEGATIVE
Glucose, UA: NEGATIVE
Ketones, UA: NEGATIVE
Nitrite, UA: NEGATIVE
Protein, UA: NEGATIVE
Spec Grav, UA: 1.025 (ref 1.010–1.025)
Urobilinogen, UA: 0.2 E.U./dL
pH, UA: 6 (ref 5.0–8.0)

## 2021-04-04 NOTE — Progress Notes (Signed)
yellow 

## 2021-04-05 ENCOUNTER — Encounter (HOSPITAL_COMMUNITY): Payer: Self-pay

## 2021-04-05 ENCOUNTER — Other Ambulatory Visit: Payer: Self-pay

## 2021-04-05 ENCOUNTER — Observation Stay (HOSPITAL_COMMUNITY)
Admission: EM | Admit: 2021-04-05 | Discharge: 2021-04-07 | Disposition: A | Discharge status: Home / Self Care | Payer: Medicare Other | Attending: Internal Medicine | Admitting: Internal Medicine

## 2021-04-05 DIAGNOSIS — M6281 Muscle weakness (generalized): Secondary | ICD-10-CM | POA: Insufficient documentation

## 2021-04-05 DIAGNOSIS — R778 Other specified abnormalities of plasma proteins: Secondary | ICD-10-CM | POA: Insufficient documentation

## 2021-04-05 DIAGNOSIS — N390 Urinary tract infection, site not specified: Principal | ICD-10-CM | POA: Insufficient documentation

## 2021-04-05 DIAGNOSIS — Z85828 Personal history of other malignant neoplasm of skin: Secondary | ICD-10-CM | POA: Insufficient documentation

## 2021-04-05 DIAGNOSIS — R4182 Altered mental status, unspecified: Secondary | ICD-10-CM | POA: Diagnosis not present

## 2021-04-05 DIAGNOSIS — G9340 Encephalopathy, unspecified: Secondary | ICD-10-CM | POA: Diagnosis present

## 2021-04-05 DIAGNOSIS — E039 Hypothyroidism, unspecified: Secondary | ICD-10-CM | POA: Diagnosis not present

## 2021-04-05 DIAGNOSIS — I10 Essential (primary) hypertension: Secondary | ICD-10-CM | POA: Diagnosis not present

## 2021-04-05 DIAGNOSIS — Z20822 Contact with and (suspected) exposure to covid-19: Secondary | ICD-10-CM | POA: Insufficient documentation

## 2021-04-05 DIAGNOSIS — Z79899 Other long term (current) drug therapy: Secondary | ICD-10-CM | POA: Insufficient documentation

## 2021-04-05 DIAGNOSIS — F0390 Unspecified dementia without behavioral disturbance: Secondary | ICD-10-CM | POA: Diagnosis not present

## 2021-04-05 DIAGNOSIS — K449 Diaphragmatic hernia without obstruction or gangrene: Secondary | ICD-10-CM | POA: Diagnosis not present

## 2021-04-05 LAB — URINALYSIS, ROUTINE W REFLEX MICROSCOPIC
Bilirubin Urine: NEGATIVE
Glucose, UA: NEGATIVE mg/dL
Hgb urine dipstick: NEGATIVE
Ketones, ur: NEGATIVE mg/dL
Nitrite: POSITIVE — AB
Protein, ur: 30 mg/dL — AB
Specific Gravity, Urine: 1.014 (ref 1.005–1.030)
pH: 6 (ref 5.0–8.0)

## 2021-04-05 LAB — CBC WITH DIFFERENTIAL/PLATELET
Abs Immature Granulocytes: 0.03 10*3/uL (ref 0.00–0.07)
Basophils Absolute: 0.1 10*3/uL (ref 0.0–0.1)
Basophils Relative: 1 %
Eosinophils Absolute: 0.1 10*3/uL (ref 0.0–0.5)
Eosinophils Relative: 1 %
HCT: 39.6 % (ref 36.0–46.0)
Hemoglobin: 12.9 g/dL (ref 12.0–15.0)
Immature Granulocytes: 0 %
Lymphocytes Relative: 20 %
Lymphs Abs: 2 10*3/uL (ref 0.7–4.0)
MCH: 31.5 pg (ref 26.0–34.0)
MCHC: 32.6 g/dL (ref 30.0–36.0)
MCV: 96.8 fL (ref 80.0–100.0)
Monocytes Absolute: 1 10*3/uL (ref 0.1–1.0)
Monocytes Relative: 10 %
Neutro Abs: 6.8 10*3/uL (ref 1.7–7.7)
Neutrophils Relative %: 68 %
Platelets: 253 10*3/uL (ref 150–400)
RBC: 4.09 MIL/uL (ref 3.87–5.11)
RDW: 13 % (ref 11.5–15.5)
WBC: 10 10*3/uL (ref 4.0–10.5)
nRBC: 0 % (ref 0.0–0.2)

## 2021-04-05 LAB — BASIC METABOLIC PANEL
Anion gap: 8 (ref 5–15)
BUN: 17 mg/dL (ref 8–23)
CO2: 26 mmol/L (ref 22–32)
Calcium: 9.7 mg/dL (ref 8.9–10.3)
Chloride: 104 mmol/L (ref 98–111)
Creatinine, Ser: 1.1 mg/dL — ABNORMAL HIGH (ref 0.44–1.00)
GFR, Estimated: 46 mL/min — ABNORMAL LOW (ref >60)
Glucose, Bld: 108 mg/dL — ABNORMAL HIGH (ref 70–99)
Potassium: 3.9 mmol/L (ref 3.5–5.1)
Sodium: 138 mmol/L (ref 135–145)

## 2021-04-05 NOTE — ED Provider Notes (Signed)
Emergency Medicine Provider Triage Evaluation Note  Nicole Downs , a 87 y.o. female  was evaluated in triage.  Pt presents with her daughter with a concern of odd behavior in the setting of a potential UTI.  Patient has been acting oddly which was the only presentation of her prior UTI last month.  Patient also with history of dementia.  Daughter also reports elevated blood pressures, 150s over 90s  Review of Systems  Positive: Urinary frequency   Negative: Dysuria, hematuria  Physical Exam  BP (!) 155/91 (BP Location: Left Arm)   Pulse 91   Temp 98.6 F (37 C) (Oral)   Resp 16   Ht 4\' 9"  (1.448 m)   Wt 52.2 kg   SpO2 98%   BMI 24.89 kg/m  Gen:   Awake, no distress   Resp:  Normal effort  MSK:   Moves extremities without difficulty  Other:    Medical Decision Making  Medically screening exam initiated at 8:55 PM.  Appropriate orders placed.  [name] was informed that the remainder of the evaluation will be completed by another provider, this initial triage assessment does not replace that evaluation, and the importance of remaining in the ED until their evaluation is complete.      Darliss Ridgel 04/05/21 2106    Carmin Muskrat, MD 04/05/21 2202

## 2021-04-05 NOTE — ED Triage Notes (Signed)
Pt BIB by daughter d/t her symptoms of HTN as well as UTI symptoms. Pt noted to be acting differently per daughter. Pt daughter noted pt having urinary urgency as well as acting differently with transient confusion. Pt has been following up with PCP.

## 2021-04-05 NOTE — Telephone Encounter (Signed)
Called patient daughter she states that this was to recheck from ED visit on 03/12/2021. You directed her to retest two weeks after she had finished abx. Daughter states that symptoms did not start until today. Informed that we will have urine culture back tomorrow and would like to hold off on treatment until then. Informed ED precautions will call if any changes.

## 2021-04-05 NOTE — Telephone Encounter (Signed)
Yes, will await urine culture results.

## 2021-04-06 ENCOUNTER — Emergency Department (HOSPITAL_COMMUNITY): Payer: Medicare Other

## 2021-04-06 DIAGNOSIS — I10 Essential (primary) hypertension: Secondary | ICD-10-CM | POA: Diagnosis not present

## 2021-04-06 DIAGNOSIS — F03918 Unspecified dementia, unspecified severity, with other behavioral disturbance: Secondary | ICD-10-CM | POA: Diagnosis not present

## 2021-04-06 DIAGNOSIS — G9340 Encephalopathy, unspecified: Secondary | ICD-10-CM

## 2021-04-06 DIAGNOSIS — N390 Urinary tract infection, site not specified: Secondary | ICD-10-CM | POA: Diagnosis not present

## 2021-04-06 DIAGNOSIS — N3000 Acute cystitis without hematuria: Secondary | ICD-10-CM

## 2021-04-06 DIAGNOSIS — R4182 Altered mental status, unspecified: Secondary | ICD-10-CM | POA: Diagnosis not present

## 2021-04-06 DIAGNOSIS — K449 Diaphragmatic hernia without obstruction or gangrene: Secondary | ICD-10-CM | POA: Diagnosis not present

## 2021-04-06 HISTORY — DX: Encephalopathy, unspecified: G93.40

## 2021-04-06 LAB — TROPONIN I (HIGH SENSITIVITY)
Troponin I (High Sensitivity): 23 ng/L — ABNORMAL HIGH (ref <18)
Troponin I (High Sensitivity): 20 ng/L — ABNORMAL HIGH (ref <18)

## 2021-04-06 LAB — CBC
HCT: 35.1 % — ABNORMAL LOW (ref 36.0–46.0)
Hemoglobin: 11.8 g/dL — ABNORMAL LOW (ref 12.0–15.0)
MCH: 32.5 pg (ref 26.0–34.0)
MCHC: 33.6 g/dL (ref 30.0–36.0)
MCV: 96.7 fL (ref 80.0–100.0)
Platelets: 204 10*3/uL (ref 150–400)
RBC: 3.63 MIL/uL — ABNORMAL LOW (ref 3.87–5.11)
RDW: 13.2 % (ref 11.5–15.5)
WBC: 11.4 10*3/uL — ABNORMAL HIGH (ref 4.0–10.5)
nRBC: 0 % (ref 0.0–0.2)

## 2021-04-06 LAB — BASIC METABOLIC PANEL
Anion gap: 7 (ref 5–15)
BUN: 16 mg/dL (ref 8–23)
CO2: 26 mmol/L (ref 22–32)
Calcium: 8.6 mg/dL — ABNORMAL LOW (ref 8.9–10.3)
Chloride: 106 mmol/L (ref 98–111)
Creatinine, Ser: 1 mg/dL (ref 0.44–1.00)
GFR, Estimated: 52 mL/min — ABNORMAL LOW (ref >60)
Glucose, Bld: 88 mg/dL (ref 70–99)
Potassium: 3.5 mmol/L (ref 3.5–5.1)
Sodium: 139 mmol/L (ref 135–145)

## 2021-04-06 LAB — TSH: TSH: 4.969 u[IU]/mL — ABNORMAL HIGH (ref 0.350–4.500)

## 2021-04-06 LAB — URINE CULTURE
MICRO NUMBER:: 12511279
SPECIMEN QUALITY:: ADEQUATE

## 2021-04-06 LAB — LACTIC ACID, PLASMA
Lactic Acid, Venous: 1.2 mmol/L (ref 0.5–1.9)
Lactic Acid, Venous: 0.9 mmol/L (ref 0.5–1.9)

## 2021-04-06 LAB — AMMONIA: Ammonia: <10 umol/L (ref 9–35)

## 2021-04-06 LAB — RESP PANEL BY RT-PCR (FLU A&B, COVID) ARPGX2
Influenza A by PCR: NEGATIVE
Influenza B by PCR: NEGATIVE
SARS Coronavirus 2 by RT PCR: NEGATIVE

## 2021-04-06 MED ORDER — SODIUM CHLORIDE 0.9 % IV SOLN
1.0000 g | INTRAVENOUS | Status: DC
  Administered 2021-04-07: 1 g via INTRAVENOUS
  Filled 2021-04-06 (×2): qty 10

## 2021-04-06 MED ORDER — SERTRALINE HCL 50 MG PO TABS
25.0000 mg | ORAL_TABLET | Freq: Every day | ORAL | Status: DC
  Administered 2021-04-06: 25 mg via ORAL
  Filled 2021-04-06: qty 1

## 2021-04-06 MED ORDER — ACETAMINOPHEN 650 MG RE SUPP: 650.0000 mg | Freq: Four times a day (QID) | RECTAL | Status: DC | PRN

## 2021-04-06 MED ORDER — LOSARTAN POTASSIUM 25 MG PO TABS
25.0000 mg | ORAL_TABLET | Freq: Every day | ORAL | Status: DC
  Administered 2021-04-06 – 2021-04-07 (×2): 25 mg via ORAL
  Filled 2021-04-06 (×2): qty 1

## 2021-04-06 MED ORDER — ENOXAPARIN SODIUM 30 MG/0.3ML IJ SOSY
30.0000 mg | PREFILLED_SYRINGE | INTRAMUSCULAR | Status: DC
  Administered 2021-04-06 – 2021-04-07 (×2): 30 mg via SUBCUTANEOUS
  Filled 2021-04-06 (×2): qty 0.3

## 2021-04-06 MED ORDER — MELATONIN 3 MG PO TABS
1.5000 mg | ORAL_TABLET | Freq: Every day | ORAL | Status: DC
  Administered 2021-04-06: 1.5 mg via ORAL
  Filled 2021-04-06: qty 1

## 2021-04-06 MED ORDER — SODIUM CHLORIDE 0.9 % IV SOLN
1.0000 g | Freq: Once | INTRAVENOUS | Status: AC
  Administered 2021-04-06: 1 g via INTRAVENOUS
  Filled 2021-04-06: qty 10

## 2021-04-06 MED ORDER — SODIUM CHLORIDE 0.9 % IV BOLUS
1000.0000 mL | Freq: Once | INTRAVENOUS | Status: AC
  Administered 2021-04-06: 1000 mL via INTRAVENOUS

## 2021-04-06 MED ORDER — VITAMIN D 25 MCG (1000 UNIT) PO TABS
1000.0000 [IU] | ORAL_TABLET | Freq: Every day | ORAL | Status: DC
  Administered 2021-04-06 – 2021-04-07 (×2): 1000 [IU] via ORAL
  Filled 2021-04-06 (×2): qty 1

## 2021-04-06 MED ORDER — CALCIUM CARBONATE 1250 (500 CA) MG PO TABS
1250.0000 mg | ORAL_TABLET | Freq: Two times a day (BID) | ORAL | Status: DC
  Administered 2021-04-06 – 2021-04-07 (×3): 1250 mg via ORAL
  Filled 2021-04-06 (×3): qty 1

## 2021-04-06 MED ORDER — LEVOBUNOLOL HCL 0.5 % OP SOLN
1.0000 [drp] | Freq: Two times a day (BID) | OPHTHALMIC | Status: DC
  Administered 2021-04-06 – 2021-04-07 (×3): 1 [drp] via OPHTHALMIC
  Filled 2021-04-06: qty 5

## 2021-04-06 MED ORDER — ACETAMINOPHEN 325 MG PO TABS: 650.0000 mg | ORAL_TABLET | Freq: Four times a day (QID) | ORAL | Status: DC | PRN

## 2021-04-06 MED ORDER — ONDANSETRON HCL 4 MG PO TABS: 4.0000 mg | ORAL_TABLET | Freq: Four times a day (QID) | ORAL | Status: DC | PRN

## 2021-04-06 MED ORDER — ONDANSETRON HCL 4 MG/2ML IJ SOLN: 4.0000 mg | Freq: Four times a day (QID) | INTRAMUSCULAR | Status: DC | PRN

## 2021-04-06 MED ORDER — LEVOTHYROXINE SODIUM 25 MCG PO TABS
25.0000 ug | ORAL_TABLET | Freq: Every day | ORAL | Status: DC
  Administered 2021-04-07: 25 ug via ORAL
  Filled 2021-04-06: qty 1

## 2021-04-06 MED ORDER — POLYVINYL ALCOHOL 1.4 % OP SOLN
1.0000 [drp] | Freq: Two times a day (BID) | OPHTHALMIC | Status: DC
  Administered 2021-04-06 – 2021-04-07 (×3): 1 [drp] via OPHTHALMIC
  Filled 2021-04-06: qty 15

## 2021-04-06 NOTE — Progress Notes (Signed)
Patient seen and examined personally, I reviewed the chart, history and physical and admission note, done by admitting physician this morning and agree with the same with following addendum.  Please refer to the morning admission note for more detailed plan of care.  Briefly,  87 year old female with history of dementia, chronic tremors was seen in the ED last month and placed on Keflex for UTI.  She was doing better but then on the day of admission she got more aggressive Argumentative and trying to get out of the house.  As per the daughter these are typical signs of UTI for her.  She saw her PCP earlier today blood urine culture and sent to the ED In the ED UA grossly abnormal with positive nitrites and pyuria COVID-19 negative ammonia normal CT head no acute finding, lactate normal.  Has history of ESBL UTI in 2019. Patient was placed on ceftriaxone and admitted for further management.   On exam this morning she is alert awake able to tell me her name her date of birth.  There are constant tremors of the head.  A/P UTI failed outpatient Keflex: We will continue with empiric ceftriaxone currently vital signs stable, follow-up urine culture.  Does have history of ESBL UTI -so we will monitor closely to see if she needs her antibiotic changed based on culture or her vitals and mental status  Acute metabolic encephalopathy Dementia: For mental status agitation in the background of dementia.  Likely from UTI.  Continue supportive care fall precaution delirium precaution.  Address UTI.  Continue melatonin and Zoloft.  Hypertension blood pressure stable on losartan  Mildly elevated troponin 23> 20 suspect demand ischemia in the setting of encephalopathy metabolic derangement.  No complaint of chest pain.  Hypothyroidism TSH slightly on higher side, continue home Synthroid TSH recheck in 4 weeks

## 2021-04-06 NOTE — ED Notes (Signed)
Daughter updated with plan of care.

## 2021-04-06 NOTE — ED Notes (Signed)
Patient transported to CT 

## 2021-04-06 NOTE — H&P (Signed)
History and Physical    Nicole Downs LFY:[id number] DOB: Sep 06, 1923 DOA: 04/05/2021  PCP: Pleas Koch, NP  Patient coming from: Home  I have personally briefly reviewed patient's old medical records in [place]  Chief Complaint: AMS  HPI: Nicole Downs is a 87 y.o. female with medical history significant of Dementia, chronic tremors.  Pt with confusion over the past couple of weeks.  Pt seen in ED at end of last month.  Put on Keflex for UTI.  Seemed to be doing better, but then today pt more aggressive, argumentitive, wandering, trying to get out of house.  These typical signs of UTI per daughter.  Saw PCP earlier today who got UCx.  Came in to ED.  No vomiting, diarrhea, CP, SOB, known fever, falls.   ED Course: does indeed look like pt has UTI with positive nitrites, LE.  COVID and flu neg.  Ammonia nl  CT head = nothing acute  Lactate nl.  History of ESBL UTI in 2019.   Review of Systems: As per HPI, otherwise all review of systems negative.  Past Medical History:  Diagnosis Date   Actinic keratosis    Acute confusion    Arthritis    Basal cell carcinoma 11/01/2015   L lat ankle   Basal cell carcinoma 12/13/2015   L dorsum foot   Basal cell carcinoma 09/12/2017   nasal root, R nasal bridge, L nasal bridge, R mandible infra auricular   Basal cell carcinoma 10/24/2017   R lateral foot   Basal cell carcinoma 12/05/2017   L dorsum foot   Basal cell carcinoma 11/27/2018   R of midline mid forehead   Basal cell carcinoma 06/25/2019   nasal bridge   Basal cell carcinoma 12/01/2019   Junction of right mandible and earlobe. Nodular pattern.   Emphysema lung (HCC)    Glaucoma    Hypertension    Hyponatremia    Hypothyroidism    Memory loss    Poor peripheral circulation    Squamous cell carcinoma of skin 09/20/2015   R prox mandible/in situ   Squamous cell carcinoma of skin 02/01/2016   nose   Squamous cell carcinoma of skin  10/24/2017   R lateral knee/in situ, R lat ankle/in situ   Squamous cell carcinoma of skin 12/05/2017   L medial calf/in situ   Squamous cell carcinoma of skin 12/01/2019   Right neck sup. SCCis, hypertrophic EDC, 03/30/21 LN2 for recurrence   Squamous cell carcinoma of skin 12/01/2019   Right neck inf.  SCCis, hypertrophic EDC, 03/30/21 LN2 fo recurrence   Squamous cell carcinoma of skin 03/02/2020   Left upper arm, proximal. SCCis   Squamous cell carcinoma of skin 03/02/2020   Left upper arm, distal. SCCis   Tremors of nervous system     Past Surgical History:  Procedure Laterality Date   BREAST BIOPSY       reports that she has never smoked. She has never used smokeless tobacco. She reports that she does not drink alcohol and does not use drugs.  Allergies  Allergen Reactions   Augmentin [Amoxicillin-Pot Clavulanate] Rash   Amlodipine Other (See Comments)    Ankle edema    Family History  Problem Relation Age of Onset   Heart attack Mother    Other Father        unsure - she was only 24 years old when he died     Prior to Admission medications   Medication Sig Start  Date End Date Taking? Authorizing Provider  Calcium Carbonate (CALCIUM 600 PO) Take 600 mg by mouth 2 (two) times daily.   Yes [provider]  Carboxymethylcellulose Sodium (REFRESH TEARS OP) Place 1 drop into both eyes in the morning and at bedtime.   Yes [provider]  Cholecalciferol (VITAMIN D3) 10000 units TABS Take 1,000 mg by mouth daily.   Yes [provider]  GLUCOSAMINE-CHONDROITIN PO Take 1 capsule by mouth daily.   Yes [provider]  ibuprofen (ADVIL) 200 MG tablet Take 200 mg by mouth at bedtime.   Yes [provider]  levobunolol (BETAGAN) 0.5 % ophthalmic solution Place 1 drop into both eyes 2 (two) times daily.   Yes [provider]  levothyroxine (SYNTHROID) 25 MCG tablet Take 1 tablet by mouth every morning on an empty stomach with  water only.  No food or other medications for 30 minutes. Office visit required for further refills. Patient taking differently: Take 25 mcg by mouth daily before breakfast. 03/29/21  Yes Pleas Koch, NP  losartan (COZAAR) 25 MG tablet TAKE 1 TABLET BY MOUTH EVERY DAY FOR BLOOD PRESSURE Patient taking differently: Take 25 mg by mouth daily. 10/14/20  Yes Pleas Koch, NP  melatonin 1 MG TABS tablet Take 1 mg by mouth at bedtime.   Yes [provider]  sertraline (ZOLOFT) 25 MG tablet TAKE 1 TABLET BY MOUTH AT BEDTIME FOR DEPRESSION. Patient taking differently: Take 25 mg by mouth at bedtime. 11/17/20  Yes Pleas Koch, NP    Physical Exam: Vitals:   04/06/21 0115 04/06/21 0130 04/06/21 0205 04/06/21 0300  BP: 132/69 (!) 137/99 (!) 145/68 121/88  Pulse: 73 68 71 60  Resp: (!) 26 (!) 29 15 (!) 23  Temp:      TempSrc:      SpO2: 92% 97% 97% 94%  Weight:      Height:        Constitutional: NAD, calm, comfortable Eyes: PERRL, lids and conjunctivae normal ENMT: Mucous membranes are moist. Posterior pharynx clear of any exudate or lesions.Normal dentition.  Neck: normal, supple, no masses, no thyromegaly Respiratory: clear to auscultation bilaterally, no wheezing, no crackles. Normal respiratory effort. No accessory muscle use.  Cardiovascular: Regular rate and rhythm, no murmurs / rubs / gallops. No extremity edema. 2+ pedal pulses. No carotid bruits.  Abdomen: no tenderness, no masses palpated. No hepatosplenomegaly. Bowel sounds positive.  Musculoskeletal: no clubbing / cyanosis. No joint deformity upper and lower extremities. Good ROM, no contractures. Normal muscle tone.  Skin: no rashes, lesions, ulcers. No induration Neurologic: Tremor of head and neck.  MAE, follows commands.  Purposeful movements. Psychiatric: Oriented to self only   Labs on Admission: I have personally reviewed following labs and imaging studies  CBC: Recent Labs  Lab 04/05/21 2102   WBC 10.0  NEUTROABS 6.8  HGB 12.9  HCT 39.6  MCV 96.8  PLT 834   Basic Metabolic Panel: Recent Labs  Lab 04/05/21 2102  NA 138  K 3.9  CL 104  CO2 26  GLUCOSE 108*  BUN 17  CREATININE 1.10*  CALCIUM 9.7   GFR: Estimated Creatinine Clearance: 21.3 mL/min (A) (by C-G formula based on SCr of 1.1 mg/dL (H)). Liver Function Tests: No results for input(s): AST, ALT, ALKPHOS, BILITOT, PROT, ALBUMIN in the last 168 hours. No results for input(s): LIPASE, AMYLASE in the last 168 hours. Recent Labs  Lab 04/06/21 0115  AMMONIA <10   Coagulation Profile:  No results for input(s): INR, PROTIME in the last 168 hours. Cardiac Enzymes: No results for input(s): CKTOTAL, CKMB, CKMBINDEX, TROPONINI in the last 168 hours. BNP (last 3 results) No results for input(s): PROBNP in the last 8760 hours. HbA1C: No results for input(s): HGBA1C in the last 72 hours. CBG: No results for input(s): GLUCAP in the last 168 hours. Lipid Profile: No results for input(s): CHOL, HDL, LDLCALC, TRIG, CHOLHDL, LDLDIRECT in the last 72 hours. Thyroid Function Tests: Recent Labs    04/06/21 0115  TSH 4.969*   Anemia Panel: No results for input(s): VITAMINB12, FOLATE, FERRITIN, TIBC, IRON, RETICCTPCT in the last 72 hours. Urine analysis:    Component Value Date/Time   COLORURINE YELLOW 04/05/2021 2055   APPEARANCEUR HAZY (A) 04/05/2021 2055   LABSPEC 1.014 04/05/2021 2055   PHURINE 6.0 04/05/2021 2055   GLUCOSEU NEGATIVE 04/05/2021 2055   GLUCOSEU NEGATIVE 06/24/2015 0959   HGBUR NEGATIVE 04/05/2021 2055   BILIRUBINUR NEGATIVE 04/05/2021 2055   BILIRUBINUR negative 04/04/2021 [place] 04/05/2021 2055   PROTEINUR 30 (A) 04/05/2021 2055   UROBILINOGEN 0.2 04/04/2021 1124   UROBILINOGEN 0.2 06/24/2015 0959   NITRITE POSITIVE (A) 04/05/2021 2055   LEUKOCYTESUR TRACE (A) 04/05/2021 2055    Radiological Exams on Admission: CT Head Wo Contrast  Result Date:  04/06/2021 CLINICAL DATA:  Altered mental status EXAM: CT HEAD WITHOUT CONTRAST TECHNIQUE: Contiguous axial images were obtained from the base of the skull through the vertex without intravenous contrast. COMPARISON:  03/12/2021 FINDINGS: Brain: Examination is somewhat limited by patient motion artifact. No evidence of acute infarction, hemorrhage, hydrocephalus, extra-axial collection or mass lesion/mass effect. Chronic atrophic and ischemic changes are noted. Vascular: No hyperdense vessel or unexpected calcification. Skull: Normal. Negative for fracture or focal lesion. Sinuses/Orbits: No acute finding. Other: None. IMPRESSION: Somewhat limited exam due to patient motion artifact. Chronic atrophic and ischemic changes without acute abnormality. Electronically Signed   By: Inez Catalina M.D.   On: 04/06/2021 02:09   DG Chest Portable 1 View  Result Date: 04/06/2021 CLINICAL DATA:  Altered mental status EXAM: PORTABLE CHEST 1 VIEW COMPARISON:  09/27/2017 FINDINGS: Lungs are clear.  No pleural effusion or pneumothorax. The heart is top-normal in size. Large hiatal hernia. IMPRESSION: No evidence of acute cardiopulmonary disease. Large hiatal hernia. Electronically Signed   By: Julian Hy M.D.   On: 04/06/2021 01:21    EKG: Independently reviewed.  Assessment/Plan Principal Problem:   UTI (urinary tract infection) Active Problems:   Essential hypertension   Dementia (HCC)   Recurrent UTI   Acute encephalopathy    UTI - failed outpt therapy with keflex Empiric rocephin for the moment No sepsis at this point UCx pending Note h/o ESBL, so if UCx positive for ESBL or if pt worsens (becomes septic) before then, low threshold to switch to merrem empirically. Acute encephalopathy - Likely delirium secondary to #1 above HTN - Cont home losartan Dementia - Cont melatonin, zoloft  DVT prophylaxis: Lovenox Code Status: DNR per daughter Family Communication: Daughter at  bedside Disposition Plan: Home after UTI / encephalopathy resolved Consults called: None Admission status: Place in [place] - though likely meets IP criteria on basis of failed outpt treatment for UTI.   Tressa Maldonado Jerilynn Mages DO [facility]  How to contact the [facility] Attending or Consulting provider [place] or covering provider during after hours [place], for this patient?  Check the care team in [facility] and look for a) attending/consulting [facility]  provider listed and b) the [facility] team listed Log into [email or web address]  Amion Physician Scheduling and messaging for groups and whole hospitals  On call and physician scheduling software for group practices, residents, hospitalists and other medical providers for call, clinic, rotation and shift schedules. OnCall [employer] is a hospital-wide system for scheduling doctors and paging doctors on call. EasyPlot is for scientific plotting and data analysis.  [email or web address]  and use [place]'s universal password to access. If you do not have the password, please contact the hospital operator.  Locate the [facility] provider you are looking for under [facility] and page to a number that you can be directly reached. If you still have difficulty reaching the provider, please page the [facility] (Director on Call) for the Hospitalists listed on amion for assistance.  04/06/2021, 3:55 AM

## 2021-04-06 NOTE — Progress Notes (Signed)
Oaklie Durrett is a 88 y.o. female patient admitted. Awake, alert - oriented  X 4 - no acute distress noted.  VSS - Blood pressure (!) 144/68, pulse 64, temperature 97.7 F (36.5 C), temperature source Oral, resp. rate 16, height 4\' 9"  (1.448 m), weight 51.8 kg, SpO2 97 %.    IV in place, occlusive dsg intact without redness.  Orientation to room, and floor completed.  Admission INP armband ID verified with patient/family, and in place.   SR up x 2, fall assessment complete, with patient and family able to verbalize understanding of risk associated with falls, and verbalized understanding to call nsg before up out of bed.  Call light within reach, patient able to voice, and demonstrate understanding. No evidence of skin break down noted on exam.   Will cont to eval and treat per MD orders.  Minna Antis, RN 04/06/2021 6:16 AM

## 2021-04-06 NOTE — ED Provider Notes (Signed)
[facility] EMERGENCY DEPARTMENT Provider Note   CSN: [id number] Arrival date & time: 04/05/21  2021     History Chief Complaint  Patient presents with   Hypertension    Ruth Kovich is a 87 y.o. female.  Level 5 caveat altered mental status.  Patient here with daughter with concern for possible UTI, confusion, aggressive behavior, wandering behavior over the past 1 day.  Daughter states patient was treated for UTI approximately 3 weeks ago with Keflex which she finished.  She went to her doctor 2 days ago and had a urinalysis and culture which are still pending.  Today the daughter noticed the patient was being aggressive, argumentative, wandering and trying to get out of the house and increasingly confused.  She is concerned about elevated blood pressure and possible new UTI.  They called the PCP who reported urine culture was not back yet and told patient to come to the ED. Patient is confused more than baseline.  She is oriented to person only.  She denies any pain.  No vomiting or diarrhea.  No chest pain or shortness of breath.  No known fever.  Has not had any fall. Has chronic tremors of her head and neck which are unchanged  The history is provided by the patient and a relative. The history is limited by the condition of the patient.  Hypertension      Past Medical History:  Diagnosis Date   Actinic keratosis    Acute confusion    Arthritis    Basal cell carcinoma 11/01/2015   L lat ankle   Basal cell carcinoma 12/13/2015   L dorsum foot   Basal cell carcinoma 09/12/2017   nasal root, R nasal bridge, L nasal bridge, R mandible infra auricular   Basal cell carcinoma 10/24/2017   R lateral foot   Basal cell carcinoma 12/05/2017   L dorsum foot   Basal cell carcinoma 11/27/2018   R of midline mid forehead   Basal cell carcinoma 06/25/2019   nasal bridge   Basal cell carcinoma 12/01/2019   Junction of right mandible and earlobe. Nodular  pattern.   Emphysema lung (HCC)    Glaucoma    Hypertension    Hyponatremia    Hypothyroidism    Memory loss    Poor peripheral circulation    Squamous cell carcinoma of skin 09/20/2015   R prox mandible/in situ   Squamous cell carcinoma of skin 02/01/2016   nose   Squamous cell carcinoma of skin 10/24/2017   R lateral knee/in situ, R lat ankle/in situ   Squamous cell carcinoma of skin 12/05/2017   L medial calf/in situ   Squamous cell carcinoma of skin 12/01/2019   Right neck sup. SCCis, hypertrophic EDC, 03/30/21 LN2 for recurrence   Squamous cell carcinoma of skin 12/01/2019   Right neck inf.  SCCis, hypertrophic EDC, 03/30/21 LN2 fo recurrence   Squamous cell carcinoma of skin 03/02/2020   Left upper arm, proximal. SCCis   Squamous cell carcinoma of skin 03/02/2020   Left upper arm, distal. SCCis   Tremors of nervous system     Patient Active Problem List   Diagnosis Date Noted   Skin tear of forearm without complication, left, initial encounter 02/01/2021   Pain due to onychomycosis of toenails of both feet 10/20/2019   Glaucoma 04/16/2019   Overgrown toenails 04/16/2019   Anemia 04/16/2019   Recurrent UTI 06/07/2018   Diverticulitis 05/02/2018   Moderate episode of recurrent major depressive  disorder ([place]) 04/10/2018   Pulmonary emphysema ([place]) 03/29/2017   Memory loss 08/09/2016   Essential tremor 04/10/2016   Vitamin D deficiency 10/08/2015   Lower extremity edema 10/08/2015   Essential hypertension 06/24/2015   Hypothyroidism 06/24/2015   Dementia ([place]) 06/24/2015   Poor peripheral circulation 06/24/2015    Past Surgical History:  Procedure Laterality Date   BREAST BIOPSY       OB History   No obstetric history on file.     Family History  Problem Relation Age of Onset   Heart attack Mother    Other Father        unsure - she was only 81 years old when he died    Social History   Tobacco Use   Smoking status: Never   Smokeless tobacco:  Never  Vaping Use   Vaping Use: Never used  Substance Use Topics   Alcohol use: No    Alcohol/week: 0.0 standard drinks   Drug use: No    Home Medications Prior to Admission medications   Medication Sig Start Date End Date Taking? Authorizing Provider  BETIMOL 0.5 % ophthalmic solution Apply 1 drop to eye 2 (two) times daily.  05/26/19   [provider]  Calcium Carbonate (CALCIUM 600 PO) Take 600 mg by mouth 2 (two) times daily.    [provider]  cephALEXin (KEFLEX) 500 MG capsule Take 1 capsule (500 mg total) by mouth 4 (four) times daily. 03/12/21   Hayden Rasmussen, MD  Cholecalciferol (VITAMIN D3) 10000 units TABS Take 1,000 mg by mouth 2 (two) times daily.    [provider]  donepezil (ARICEPT) 5 MG tablet TAKE 1 TABLET BY MOUTH EVERYDAY AT BEDTIME 06/21/20   Pleas Koch, NP  levobunolol (BETAGAN) 0.5 % ophthalmic solution Place 1 drop into both eyes 2 (two) times daily.    [provider]  levothyroxine (SYNTHROID) 25 MCG tablet Take 1 tablet by mouth every morning on an empty stomach with water only.  No food or other medications for 30 minutes. Office visit required for further refills. 03/29/21   Pleas Koch, NP  losartan (COZAAR) 25 MG tablet TAKE 1 TABLET BY MOUTH EVERY DAY FOR BLOOD PRESSURE 10/14/20   Pleas Koch, NP  Melatonin-Pyridoxine (MELATIN PO) Take 1 tablet by mouth at bedtime as needed (sleep).    [provider]  sertraline (ZOLOFT) 25 MG tablet TAKE 1 TABLET BY MOUTH AT BEDTIME FOR DEPRESSION. 11/17/20   Pleas Koch, NP    Allergies    Augmentin [amoxicillin-pot clavulanate] and Amlodipine  Review of Systems   Review of Systems  Unable to perform ROS: Dementia   Physical Exam Updated Vital Signs BP (!) 160/79   Pulse 71   Temp 98.6 F (37 C) (Oral)   Resp (!) 24   Ht 4\' 9"  (1.448 m)   Wt 52.2 kg   SpO2 98%   BMI 24.89 kg/m   Physical Exam Vitals and nursing note reviewed.   Constitutional:      General: She is not in acute distress.    Appearance: She is well-developed.     Comments: Tremors of head and neck  HENT:     Head: Normocephalic and atraumatic.     Mouth/Throat:     Pharynx: No oropharyngeal exudate.  Eyes:     Conjunctiva/sclera: Conjunctivae normal.     Pupils: Pupils are equal, round, and reactive to light.  Neck:     Comments: No  meningismus. Cardiovascular:     Rate and Rhythm: Normal rate and regular rhythm.     Heart sounds: Normal heart sounds. No murmur heard. Pulmonary:     Effort: Pulmonary effort is normal. No respiratory distress.     Breath sounds: Normal breath sounds.  Abdominal:     Palpations: Abdomen is soft.     Tenderness: There is no abdominal tenderness. There is no guarding or rebound.  Musculoskeletal:        General: No tenderness. Normal range of motion.     Cervical back: Normal range of motion and neck supple.  Skin:    General: Skin is warm.  Neurological:     Mental Status: She is alert.     Motor: No abnormal muscle tone.     Comments: Oriented to person only.  Follows commands moves extremities symmetrically  Psychiatric:        Behavior: Behavior normal.    ED Results / Procedures / Treatments   Labs (all labs ordered are listed, but only abnormal results are displayed) Labs Reviewed  URINALYSIS, ROUTINE W REFLEX MICROSCOPIC - Abnormal; Notable for the following components:      Result Value   APPearance HAZY (*)    Protein, ur 30 (*)    Nitrite POSITIVE (*)    Leukocytes,Ua TRACE (*)    Bacteria, UA MANY (*)    All other components within normal limits  BASIC METABOLIC PANEL - Abnormal; Notable for the following components:   Glucose, Bld 108 (*)    Creatinine, Ser 1.10 (*)    GFR, Estimated 46 (*)    All other components within normal limits  TSH - Abnormal; Notable for the following components:   TSH 4.969 (*)    All other components within normal limits  CBC - Abnormal; Notable for  the following components:   WBC 11.4 (*)    RBC 3.63 (*)    Hemoglobin 11.8 (*)    HCT 35.1 (*)    All other components within normal limits  BASIC METABOLIC PANEL - Abnormal; Notable for the following components:   Calcium 8.6 (*)    GFR, Estimated 52 (*)    All other components within normal limits  TROPONIN I (HIGH SENSITIVITY) - Abnormal; Notable for the following components:   Troponin I (High Sensitivity) 23 (*)    All other components within normal limits  TROPONIN I (HIGH SENSITIVITY) - Abnormal; Notable for the following components:   Troponin I (High Sensitivity) 20 (*)    All other components within normal limits  RESP PANEL BY RT-PCR (FLU A&B, COVID) ARPGX2  URINE CULTURE  CULTURE, BLOOD (ROUTINE X 2)  CULTURE, BLOOD (ROUTINE X 2)  CBC WITH DIFFERENTIAL/PLATELET  LACTIC ACID, PLASMA  LACTIC ACID, PLASMA  AMMONIA    EKG EKG Interpretation  Date/Time:  Tuesday April 05 2021 20:44:45 EDT Ventricular Rate:  86 PR Interval:  164 QRS Duration: 68 QT Interval:  346 QTC Calculation: 414 R Axis:   -48 Text Interpretation: Normal sinus rhythm Left anterior fascicular block Nonspecific ST abnormality Abnormal ECG No significant change was found Confirmed by Ezequiel Essex [phone]) on 04/06/2021 1:30:23 AM  Radiology CT Head Wo Contrast  Result Date: 04/06/2021 CLINICAL DATA:  Altered mental status EXAM: CT HEAD WITHOUT CONTRAST TECHNIQUE: Contiguous axial images were obtained from the base of the skull through the vertex without intravenous contrast. COMPARISON:  03/12/2021 FINDINGS: Brain: Examination is somewhat limited by patient motion artifact. No evidence of acute infarction,  hemorrhage, hydrocephalus, extra-axial collection or mass lesion/mass effect. Chronic atrophic and ischemic changes are noted. Vascular: No hyperdense vessel or unexpected calcification. Skull: Normal. Negative for fracture or focal lesion. Sinuses/Orbits: No acute finding. Other: None.  IMPRESSION: Somewhat limited exam due to patient motion artifact. Chronic atrophic and ischemic changes without acute abnormality. Electronically Signed   By: Inez Catalina M.D.   On: 04/06/2021 02:09   DG Chest Portable 1 View  Result Date: 04/06/2021 CLINICAL DATA:  Altered mental status EXAM: PORTABLE CHEST 1 VIEW COMPARISON:  09/27/2017 FINDINGS: Lungs are clear.  No pleural effusion or pneumothorax. The heart is top-normal in size. Large hiatal hernia. IMPRESSION: No evidence of acute cardiopulmonary disease. Large hiatal hernia. Electronically Signed   By: Julian Hy M.D.   On: 04/06/2021 01:21    Procedures Procedures   Medications Ordered in ED Medications  sodium chloride 0.9 % bolus 1,000 mL (has no administration in time range)  cefTRIAXone (ROCEPHIN) 1 g in sodium chloride 0.9 % 100 mL IVPB (has no administration in time range)    ED Course  I have reviewed the triage vital signs and the nursing notes.  Pertinent labs & imaging results that were available during my care of the patient were reviewed by me and considered in my medical decision making (see chart for details).    MDM Rules/Calculators/A&P                           Confusion with concern for UTI.  Vital stable.  No distress.  Neurological exam is nonfocal.  Urinalysis positive for infection again.  No culture sent during previous ED visit.  Will initiate IV Rocephin and send culture.  Labs are reassuring.  Lactate is normal.  No leukocytosis.  COVID is negative.  Chest x-ray is negative.  Concern for UTI causing her behavior change and agitation.  Daughter requesting overnight observation given her behavior change and confusion. Urine culture pending. No culture available from previous visit  UTI as well as ongoing confusion and behavior change we will plan for admission to the hospital for IV antibiotics and IV fluids.  Daughter in agreement.  Discussed with Dr. Alcario Drought Final Clinical  Impression(s) / ED Diagnoses Final diagnoses:  None    Rx / DC Orders ED Discharge Orders     None        Mozella Rexrode, Annie Main, MD 04/06/21 [phone]

## 2021-04-06 NOTE — Telephone Encounter (Signed)
Spoke with patient's daughter last night. Patient now admitted for UTI.

## 2021-04-07 ENCOUNTER — Other Ambulatory Visit (HOSPITAL_COMMUNITY): Payer: Self-pay

## 2021-04-07 DIAGNOSIS — N390 Urinary tract infection, site not specified: Secondary | ICD-10-CM | POA: Diagnosis not present

## 2021-04-07 DIAGNOSIS — N3000 Acute cystitis without hematuria: Secondary | ICD-10-CM | POA: Diagnosis not present

## 2021-04-07 LAB — CBC
HCT: 32.2 % — ABNORMAL LOW (ref 36.0–46.0)
Hemoglobin: 10.6 g/dL — ABNORMAL LOW (ref 12.0–15.0)
MCH: 31.8 pg (ref 26.0–34.0)
MCHC: 32.9 g/dL (ref 30.0–36.0)
MCV: 96.7 fL (ref 80.0–100.0)
Platelets: 184 10*3/uL (ref 150–400)
RBC: 3.33 MIL/uL — ABNORMAL LOW (ref 3.87–5.11)
RDW: 13.2 % (ref 11.5–15.5)
WBC: 8.4 10*3/uL (ref 4.0–10.5)
nRBC: 0 % (ref 0.0–0.2)

## 2021-04-07 LAB — BASIC METABOLIC PANEL
Anion gap: 10 (ref 5–15)
BUN: 14 mg/dL (ref 8–23)
CO2: 23 mmol/L (ref 22–32)
Calcium: 8.9 mg/dL (ref 8.9–10.3)
Chloride: 106 mmol/L (ref 98–111)
Creatinine, Ser: 1.15 mg/dL — ABNORMAL HIGH (ref 0.44–1.00)
GFR, Estimated: 44 mL/min — ABNORMAL LOW (ref >60)
Glucose, Bld: 93 mg/dL (ref 70–99)
Potassium: 3.5 mmol/L (ref 3.5–5.1)
Sodium: 139 mmol/L (ref 135–145)

## 2021-04-07 LAB — URINE CULTURE: Culture: >=100000 — AB

## 2021-04-07 MED ORDER — FOSFOMYCIN TROMETHAMINE 3 G PO PACK
3.0000 g | PACK | Freq: Once | ORAL | 0 refills | Status: AC
  Filled 2021-04-07: qty 3, 1d supply, fill #0

## 2021-04-07 MED ORDER — FOSFOMYCIN TROMETHAMINE 3 G PO PACK
3.0000 g | PACK | Freq: Once | ORAL | Status: AC
  Administered 2021-04-07: 3 g via ORAL
  Filled 2021-04-07: qty 3

## 2021-04-07 NOTE — Evaluation (Signed)
Occupational Therapy Evaluation Patient Details Name: Nicole Downs MRN: [id number] DOB: 1924/01/08 Today's Date: 04/07/2021   History of Present Illness Pt is a 87 y/o female admitted 10/18 secondary to AMS. Pt with recent UTI and found to have UTI upon admission. PMH includes dementia, tremors and HTN.   Clinical Impression   Pt admitted for above and presenting with problem list below, including impaired balance, decreased activity tolerance. Pt completing mobility and transfers using RW in room with min guard, up to min guard for ADLs. Cueing for safety throughout session and recommended daughter provide close supervision with mobility and Adls at dc- daughter agreeable. Will follow acutely, but anticipate no further needs after dc home.      Recommendations for follow up therapy are one component of a multi-disciplinary discharge planning process, led by the attending physician.  Recommendations may be updated based on patient status, additional functional criteria and insurance authorization.   Follow Up Recommendations  No OT follow up;Supervision/Assistance - 24 hour    Equipment Recommendations  None recommended by OT    Recommendations for Other Services       Precautions / Restrictions Precautions Precautions: Fall Restrictions Weight Bearing Restrictions: No      Mobility Bed Mobility Overal bed mobility: Modified Independent                  Transfers Overall transfer level: Needs assistance Equipment used: Rolling walker (2 wheeled) Transfers: Sit to/from Stand Sit to Stand: Min guard         General transfer comment: Min guard for safety. Cues for hand placement.    Balance Overall balance assessment: Needs assistance Sitting-balance support: No upper extremity supported;Feet supported Sitting balance-Leahy Scale: Good     Standing balance support: Bilateral upper extremity supported;During functional activity Standing balance-Leahy  Scale: Poor Standing balance comment: Reliant on BUE support                           ADL either performed or assessed with clinical judgement   ADL Overall ADL's : Needs assistance/impaired     Grooming: Min guard;Standing           Upper Body Dressing : Supervision/safety;Sitting   Lower Body Dressing: Min guard;Sit to/from stand Lower Body Dressing Details (indicate cue type and reason): able to don/doff socks, min guard in standing Toilet Transfer: Min guard;Ambulation;RW   Toileting- Water quality scientist and Hygiene: Min guard;Sit to/from stand       Functional mobility during ADLs: Min guard;Rolling walker;Cueing for safety       Vision         Perception     Praxis      Pertinent Vitals/Pain Pain Assessment: No/denies pain     Hand Dominance     Extremity/Trunk Assessment Upper Extremity Assessment Upper Extremity Assessment: Generalized weakness   Lower Extremity Assessment Lower Extremity Assessment: Defer to PT evaluation   Cervical / Trunk Assessment Cervical / Trunk Assessment: Kyphotic;Other exceptions Cervical / Trunk Exceptions: tremors at head and neck   Communication Communication Communication: HOH   Cognition Arousal/Alertness: Awake/alert Behavior During Therapy: WFL for tasks assessed/performed Overall Cognitive Status: History of cognitive impairments - at baseline                                 General Comments: Dementia at baseline. Pt's daughter reports she is slightly off from baseline  General Comments  daughter present and supportive, educated on recommendation for close supervision for all mobility and ADLs at this time    Exercises     Shoulder Instructions      Home Living Family/patient expects to be discharged to:: Private residence Living Arrangements: Children Available Help at Discharge: Family;Available 24 hours/day Type of Home: House Home Access: Stairs to enter [employer] of Steps: 8 Entrance Stairs-Rails: Left Home Layout: Multi-level;Able to live on main level with bedroom/bathroom     Bathroom Shower/Tub: Teacher, early years/pre: Standard     Home Equipment: Environmental consultant - 2 wheels;Shower seat;Bedside commode          Prior Functioning/Environment Level of Independence: Needs assistance  Gait / Transfers Assistance Needed: Pt able to ambulate with RW ADL's / Homemaking Assistance Needed: independently dressing and toilets herself, supervision for bathing in shower            OT Problem List: Decreased strength;Decreased activity tolerance;Impaired balance (sitting and/or standing);Decreased safety awareness      OT Treatment/Interventions: Self-care/ADL training;DME and/or AE instruction;Balance training;Patient/family education;Therapeutic activities    OT Goals(Current goals can be found in the care plan section) Acute Rehab OT Goals Patient Stated Goal: to go home OT Goal Formulation: With patient Time For Goal Achievement: 04/21/21 Potential to Achieve Goals: Good  OT Frequency: Min 2X/week   Barriers to D/C:            Co-evaluation              AM-PAC OT "6 Clicks" Daily Activity     Outcome Measure Help from another person eating meals?: None Help from another person taking care of personal grooming?: A Little Help from another person toileting, which includes using toliet, bedpan, or urinal?: A Little Help from another person bathing (including washing, rinsing, drying)?: A Little Help from another person to put on and taking off regular upper body clothing?: A Little Help from another person to put on and taking off regular lower body clothing?: A Little 6 Click Score: 19   End of Session Equipment Utilized During Treatment: Rolling walker Nurse Communication: Mobility status;Other (comment) (in chair with family at side)  Activity Tolerance: Patient tolerated treatment well Patient left: in  chair;with call bell/phone within reach;with family/visitor present  OT Visit Diagnosis: Other abnormalities of gait and mobility (R26.89);Muscle weakness (generalized) (M62.81)                Time: 1140-1200 OT Time Calculation (min): 20 min Charges:  OT General Charges $OT Visit: 1 Visit OT Evaluation $OT Eval Low Complexity: 1 Low  Jolaine Artist, OT Acute Rehabilitation Services Pager [phone] Office [phone]   Delight Stare 04/07/2021, 12:54 PM

## 2021-04-07 NOTE — TOC Benefit Eligibility Note (Signed)
Patient Advocate Encounter  Prior Authorization for Fosfomycin Tromethamine 3 gm packets has been approved.    PA# [id number] Effective dates: 04/07/2021 through 06/18/2021      Lyndel Safe, [place] Patient Advocate Specialist [place] Antimicrobial Stewardship Team Direct Number: [phone]  Fax: [phone]

## 2021-04-07 NOTE — Plan of Care (Signed)
Patient discharged to go home with [facility]

## 2021-04-07 NOTE — TOC Benefit Eligibility Note (Signed)
Patient Advocate Encounter   Received notification that prior authorization for Fosfomycin Tromethamine 3 gm packets is required.   PA submitted on 04/07/2021 Key [id number] Status is pending       Lyndel Safe, CPhT Pharmacy Patient Advocate Specialist [facility] Antimicrobial Stewardship Team Direct Number: [phone]  Fax: [phone]

## 2021-04-07 NOTE — Discharge Summary (Signed)
Physician Discharge Summary  Nicole Downs ZJQ:[id number] DOB: Jun 19, 1924 DOA: 04/05/2021  PCP: Pleas Koch, NP  Admit date: 04/05/2021 Discharge date: 04/07/2021  Admitted From: home Disposition:  home  Recommendations for Outpatient Follow-up:  Follow up with PCP in 1-2 weeks Please obtain BMP/CBC in one week  Home Health:yes  Equipment/Devices: none  Discharge Condition: Stable Code Status:   Code Status: DNR Diet recommendation:  Diet Order             Diet Heart Room service appropriate? Yes; Fluid consistency: Thin  Diet effective now                    Brief/Interim Summary: 87 year old female with history of dementia, chronic tremors was seen in the ED last month and placed on Keflex for UTI.  She was doing better but then on the day of admission she got more aggressive Argumentative and trying to get out of the house.  As per the daughter these are typical signs of UTI for her.  She saw her PCP earlier today blood urine culture and sent to the ED In the ED UA grossly abnormal with positive nitrites and pyuria COVID-19 negative ammonia normal CT head no acute finding, lactate normal.  Has history of ESBL UTI in 2019. Patient was placed on ceftriaxone and admitted for further management. Patient's urine culture came back ESBL, discussed with pharmacy given fosfomycin for cystitis.  At this time patient is alert awake oriented to self date of birth current place alert awake pleasant.  Daughter at the bedside seen by PT OT setting of home health PT.  Daughter agreed for discharge home today.   Patient has 24/7 care at home  Discharge Diagnoses:   ESBL cystitis- failed outpatient Keflex: Given fosfomycin times 1 repeat dose in 3 days.  At this time she is clinically stable and is being discharged home  Acute metabolic encephalopathy Dementia: Mental status improved to baseline.  Continue PT OT with home health, continue melatonin and Zoloft.    Hypertension blood pressure stable on losartan   Mildly elevated troponin 23> 20 suspect demand ischemia in the setting of encephalopathy metabolic derangement.  No complaint of chest pain.   Hypothyroidism TSH slightly on higher side, continue home Synthroid TSH recheck in 4 weeks.  Consults: THC  Subjective: Alert awake oriented to self current place, pleasant, communicative  Discharge Exam: Vitals:   04/07/21 0432 04/07/21 0800  BP: (!) 152/79 (!) 159/73  Pulse: 68 80  Resp: 17 16  Temp: 98.4 F (36.9 C) 98.3 F (36.8 C)  SpO2: 97% 95%   General: Pt is alert, awake, not in acute distress Cardiovascular: RRR, S1/S2 +, no rubs, no gallops Respiratory: CTA bilaterally, no wheezing, no rhonchi Abdominal: Soft, NT, ND, bowel sounds + Extremities: no edema, no cyanosis  Discharge Instructions  Discharge Instructions     Discharge instructions   Complete by: As directed    Please call call MD or return to ER for similar or worsening recurring problem that brought you to hospital or if any fever,nausea/vomiting,abdominal pain, uncontrolled pain, chest pain,  shortness of breath or any other alarming symptoms.  Please follow-up your doctor as instructed in a week time and call the office for appointment.  Please avoid alcohol, smoking, or any other illicit substance and maintain healthy habits including taking your regular medications as prescribed.  You were cared for by a hospitalist during your hospital stay. If you have any questions about your discharge  medications or the care you received while you were in the hospital after you are discharged, you can call the unit and ask to speak with the hospitalist on call if the hospitalist that took care of you is not available.  Once you are discharged, your primary care physician will handle any further medical issues. Please note that NO REFILLS for any discharge medications will be authorized once you are discharged, as it is  imperative that you return to your primary care physician (or establish a relationship with a primary care physician if you do not have one) for your aftercare needs so that they can reassess your need for medications and monitor your lab values   Increase activity slowly   Complete by: As directed       Allergies as of 04/07/2021       Reactions   Augmentin [amoxicillin-pot Clavulanate] Rash   Amlodipine Other (See Comments)   Ankle edema        Medication List     TAKE these medications    CALCIUM 600 PO Take 600 mg by mouth 2 (two) times daily.   fosfomycin 3 g Pack Commonly known as: MONUROL Take 3 g by mouth once for 1 dose. Start taking on: April 10, 2021   GLUCOSAMINE-CHONDROITIN PO Take 1 capsule by mouth daily.   ibuprofen 200 MG tablet Commonly known as: ADVIL Take 200 mg by mouth at bedtime.   levobunolol 0.5 % ophthalmic solution Commonly known as: BETAGAN Place 1 drop into both eyes 2 (two) times daily.   levothyroxine 25 MCG tablet Commonly known as: SYNTHROID Take 1 tablet by mouth every morning on an empty stomach with water only.  No food or other medications for 30 minutes. Office visit required for further refills. What changed:  how much to take how to take this when to take this additional instructions   losartan 25 MG tablet Commonly known as: COZAAR TAKE 1 TABLET BY MOUTH EVERY DAY FOR BLOOD PRESSURE What changed: See the new instructions.   melatonin 1 MG Tabs tablet Take 1 mg by mouth at bedtime.   REFRESH TEARS OP Place 1 drop into both eyes in the morning and at bedtime.   sertraline 25 MG tablet Commonly known as: ZOLOFT TAKE 1 TABLET BY MOUTH AT BEDTIME FOR DEPRESSION. What changed: See the new instructions.   Vitamin D3 250 MCG (10000 UT) Tabs Take 1,000 mg by mouth daily.        Follow-up Information     Pleas Koch, NP Follow up.   Specialty: Internal Medicine Contact information: [place] [address] [phone]                Allergies  Allergen Reactions   Augmentin [Amoxicillin-Pot Clavulanate] Rash   Amlodipine Other (See Comments)    Ankle edema    The results of significant diagnostics from this hospitalization (including imaging, microbiology, ancillary and laboratory) are listed below for reference.    Microbiology: Recent Results (from the past 240 hour(s))  Urine Culture     Status: Abnormal   Collection Time: 04/04/21 11:02 AM   Specimen: Urine  Result Value Ref Range Status   MICRO NUMBER: [id number]  Final   SPECIMEN QUALITY: Adequate  Final   Sample Source NOT GIVEN  Final   STATUS: FINAL  Final   ISOLATE 1: ESBL Escherichia coli (A)  Final    Comment: Greater than 100,000 CFU/mL of Escherichia coli (ESBL)  ESBL RESULT:        The organism has been confirmed as an ESBL producer.      Susceptibility   Esbl escherichia coli - URINE CULTURE, REFLEX    AMOX/CLAVULANIC 16 Intermediate     AMPICILLIN* >=32 Resistant      * Extended spectrum beta-lactamase (ESBL) producing organisms demonstrate decreased activity with penicillins, cephalosporins and aztreonam.     AMPICILLIN/SULBACTAM >=32 Resistant     CEFAZOLIN* >=64 Resistant      * Extended spectrum beta-lactamase (ESBL) producing organisms demonstrate decreased activity with penicillins, cephalosporins and aztreonam. For uncomplicated UTI caused by E. coli, K. pneumoniae or P. mirabilis: Cefazolin is susceptible if MIC <32 mcg/mL and predicts susceptible to the oral agents cefaclor, cefdinir, cefpodoxime, cefprozil, cefuroxime, cephalexin and loracarbef.     CEFTAZIDIME 4 Sensitive     CEFEPIME 8 Intermediate     CEFTRIAXONE >=64 Resistant     CIPROFLOXACIN >=4 Resistant     LEVOFLOXACIN >=8 Resistant     GENTAMICIN >=16 Resistant     IMIPENEM <=0.25 Sensitive     NITROFURANTOIN <=16 Sensitive     PIP/TAZO <=4 Sensitive     TOBRAMYCIN 8 Intermediate     TRIMETH/SULFA*  >=320 Resistant      * Extended spectrum beta-lactamase (ESBL) producing organisms demonstrate decreased activity with penicillins, cephalosporins and aztreonam. For uncomplicated UTI caused by E. coli, K. pneumoniae or P. mirabilis: Cefazolin is susceptible if MIC <32 mcg/mL and predicts susceptible to the oral agents cefaclor, cefdinir, cefpodoxime, cefprozil, cefuroxime, cephalexin and loracarbef. Legend: S = Susceptible  I = Intermediate R = Resistant  NS = Not susceptible * = Not tested  NR = Not reported **NN = See antimicrobic comments   Urine Culture     Status: Abnormal   Collection Time: 04/05/21  8:56 PM   Specimen: Urine, Clean Catch  Result Value Ref Range Status   Specimen Description URINE, CLEAN CATCH  Final   Special Requests   Final    NONE Performed at [facility], [address] [place], [place] [address]    Culture (A)  Final    >=100,000 COLONIES/mL ESCHERICHIA COLI Confirmed Extended Spectrum Beta-Lactamase Producer (ESBL).  In bloodstream infections from ESBL organisms, carbapenems are preferred over piperacillin/tazobactam. They are shown to have a lower risk of mortality.    Report Status 04/07/2021 FINAL  Final   Organism ID, Bacteria ESCHERICHIA COLI (A)  Final      Susceptibility   Escherichia coli - MIC*    AMPICILLIN >=32 RESISTANT Resistant     CEFAZOLIN >=64 RESISTANT Resistant     CEFEPIME 16 RESISTANT Resistant     CEFTRIAXONE >=64 RESISTANT Resistant     CIPROFLOXACIN >=4 RESISTANT Resistant     GENTAMICIN >=16 RESISTANT Resistant     IMIPENEM <=0.25 SENSITIVE Sensitive     NITROFURANTOIN <=16 SENSITIVE Sensitive     TRIMETH/SULFA >=320 RESISTANT Resistant     AMPICILLIN/SULBACTAM >=32 RESISTANT Resistant     PIP/TAZO <=4 SENSITIVE Sensitive     * >=100,000 COLONIES/mL ESCHERICHIA COLI  Resp Panel by RT-PCR (Flu A&B, Covid) Nasopharyngeal Swab     Status: None   Collection Time: 04/06/21  1:13 AM   Specimen: Nasopharyngeal  Swab; Nasopharyngeal(NP) swabs in vial transport medium  Result Value Ref Range Status   SARS Coronavirus 2 by RT PCR NEGATIVE NEGATIVE Final    Comment: (NOTE) SARS-CoV-2 target nucleic acids are NOT DETECTED.  The SARS-CoV-2 RNA is generally detectable  in upper respiratory specimens during the acute phase of infection. The lowest concentration of SARS-CoV-2 viral copies this assay can detect is 138 copies/mL. A negative result does not preclude SARS-Cov-2 infection and should not be used as the sole basis for treatment or other patient management decisions. A negative result may occur with  improper specimen collection/handling, submission of specimen other than nasopharyngeal swab, presence of viral mutation(s) within the areas targeted by this assay, and inadequate number of viral copies(<138 copies/mL). A negative result must be combined with clinical observations, patient history, and epidemiological information. The expected result is Negative.  Fact Sheet for Patients:  EntrepreneurPulse.com.au  Fact Sheet for Healthcare Providers:  IncredibleEmployment.be  This test is no t yet approved or cleared by the [place] FDA and  has been authorized for detection and/or diagnosis of SARS-CoV-2 by FDA under an Emergency Use Authorization (EUA). This EUA will remain  in effect (meaning this test can be used) for the duration of the COVID-19 declaration under Section 564(b)(1) of the Act, 21 U.S.C.section 360bbb-3(b)(1), unless the authorization is terminated  or revoked sooner.       Influenza A by PCR NEGATIVE NEGATIVE Final   Influenza B by PCR NEGATIVE NEGATIVE Final    Comment: (NOTE) The Xpert Xpress SARS-CoV-2/FLU/RSV plus assay is intended as an aid in the diagnosis of influenza from Nasopharyngeal swab specimens and should not be used as a sole basis for treatment. Nasal washings and aspirates are unacceptable for Xpert Xpress  SARS-CoV-2/FLU/RSV testing.  Fact Sheet for Patients: EntrepreneurPulse.com.au  Fact Sheet for Healthcare Providers: IncredibleEmployment.be  This test is not yet approved or cleared by the [place] FDA and has been authorized for detection and/or diagnosis of SARS-CoV-2 by FDA under an Emergency Use Authorization (EUA). This EUA will remain in effect (meaning this test can be used) for the duration of the COVID-19 declaration under Section 564(b)(1) of the Act, 21 U.S.C. section 360bbb-3(b)(1), unless the authorization is terminated or revoked.  Performed at [facility], [place] [address] [place], [place] [address]     Procedures/Studies: CT Head Wo Contrast  Result Date: 04/06/2021 CLINICAL DATA:  Altered mental status EXAM: CT HEAD WITHOUT CONTRAST TECHNIQUE: Contiguous axial images were obtained from the base of the skull through the vertex without intravenous contrast. COMPARISON:  03/12/2021 FINDINGS: Brain: Examination is somewhat limited by patient motion artifact. No evidence of acute infarction, hemorrhage, hydrocephalus, extra-axial collection or mass lesion/mass effect. Chronic atrophic and ischemic changes are noted. Vascular: No hyperdense vessel or unexpected calcification. Skull: Normal. Negative for fracture or focal lesion. Sinuses/Orbits: No acute finding. Other: None. IMPRESSION: Somewhat limited exam due to patient motion artifact. Chronic atrophic and ischemic changes without acute abnormality. Electronically Signed   By: Inez Catalina M.D.   On: 04/06/2021 02:09   CT HEAD WO CONTRAST  Result Date: 03/12/2021 CLINICAL DATA:  Mental status change, unknown cause EXAM: CT HEAD WITHOUT CONTRAST TECHNIQUE: Contiguous axial images were obtained from the base of the skull through the vertex without intravenous contrast. COMPARISON:  None. FINDINGS: Evaluation is limited secondary to motion and patient inability to cooperate with  exam Brain: No definitive evidence of acute infarction, large volume hemorrhage, hydrocephalus, extra-axial collection or mass lesion/mass effect. Global parenchymal volume loss. Periventricular white matter hypodensities consistent with sequela of chronic microvascular ischemic disease. Vascular: Scattered vascular calcifications. Skull: No definitive acute fracture. Sinuses/Orbits: No acute finding. Other: None. IMPRESSION: Evaluation is limited secondary to motion and patient inability to cooperate  with exam. Within these limitations, no acute intracranial abnormality. Evaluation for small volume hemorrhage is limited due to motion and corresponding artifact. Electronically Signed   By: Valentino Saxon M.D.   On: 03/12/2021 19:00   DG Chest Portable 1 View  Result Date: 04/06/2021 CLINICAL DATA:  Altered mental status EXAM: PORTABLE CHEST 1 VIEW COMPARISON:  09/27/2017 FINDINGS: Lungs are clear.  No pleural effusion or pneumothorax. The heart is top-normal in size. Large hiatal hernia. IMPRESSION: No evidence of acute cardiopulmonary disease. Large hiatal hernia. Electronically Signed   By: Julian Hy M.D.   On: 04/06/2021 01:21    Labs: BNP (last 3 results) No results for input(s): BNP in the last 8760 hours. Basic Metabolic Panel: Recent Labs  Lab 04/05/21 2102 04/06/21 0358 04/07/21 0232  NA 138 139 139  K 3.9 3.5 3.5  CL 104 106 106  CO2 26 26 23   GLUCOSE 108* 88 93  BUN 17 16 14   CREATININE 1.10* 1.00 1.15*  CALCIUM 9.7 8.6* 8.9   Liver Function Tests: No results for input(s): AST, ALT, ALKPHOS, BILITOT, PROT, ALBUMIN in the last 168 hours. No results for input(s): LIPASE, AMYLASE in the last 168 hours. Recent Labs  Lab 04/06/21 0115  AMMONIA <10   CBC: Recent Labs  Lab 04/05/21 2102 04/06/21 0358 04/07/21 0232  WBC 10.0 11.4* 8.4  NEUTROABS 6.8  --   --   HGB 12.9 11.8* 10.6*  HCT 39.6 35.1* 32.2*  MCV 96.8 96.7 96.7  PLT 253 204 184   Cardiac  Enzymes: No results for input(s): CKTOTAL, CKMB, CKMBINDEX, TROPONINI in the last 168 hours. BNP: Invalid input(s): POCBNP CBG: No results for input(s): GLUCAP in the last 168 hours. D-Dimer No results for input(s): DDIMER in the last 72 hours. Hgb A1c No results for input(s): HGBA1C in the last 72 hours. Lipid Profile No results for input(s): CHOL, HDL, LDLCALC, TRIG, CHOLHDL, LDLDIRECT in the last 72 hours. Thyroid function studies Recent Labs    04/06/21 0115  TSH 4.969*   Anemia work up No results for input(s): VITAMINB12, FOLATE, FERRITIN, TIBC, IRON, RETICCTPCT in the last 72 hours. Urinalysis    Component Value Date/Time   COLORURINE YELLOW 04/05/2021 2055   APPEARANCEUR HAZY (A) 04/05/2021 2055   LABSPEC 1.014 04/05/2021 2055   PHURINE 6.0 04/05/2021 2055   GLUCOSEU NEGATIVE 04/05/2021 2055   GLUCOSEU NEGATIVE 06/24/2015 0959   HGBUR NEGATIVE 04/05/2021 2055   BILIRUBINUR NEGATIVE 04/05/2021 2055   BILIRUBINUR negative 04/04/2021 [place] 04/05/2021 2055   PROTEINUR 30 (A) 04/05/2021 2055   UROBILINOGEN 0.2 04/04/2021 1124   UROBILINOGEN 0.2 06/24/2015 0959   NITRITE POSITIVE (A) 04/05/2021 2055   LEUKOCYTESUR TRACE (A) 04/05/2021 2055   Sepsis Labs Invalid input(s): PROCALCITONIN,  WBC,  LACTICIDVEN Microbiology Recent Results (from the past 240 hour(s))  Urine Culture     Status: Abnormal   Collection Time: 04/04/21 11:02 AM   Specimen: Urine  Result Value Ref Range Status   MICRO NUMBER: [id number]  Final   SPECIMEN QUALITY: Adequate  Final   Sample Source NOT GIVEN  Final   STATUS: FINAL  Final   ISOLATE 1: ESBL Escherichia coli (A)  Final    Comment: Greater than 100,000 CFU/mL of Escherichia coli (ESBL) ESBL RESULT:        The organism has been confirmed as an ESBL producer.      Susceptibility   Esbl escherichia coli - URINE CULTURE, REFLEX  AMOX/CLAVULANIC 16 Intermediate     AMPICILLIN* >=32 Resistant      * Extended spectrum  beta-lactamase (ESBL) producing organisms demonstrate decreased activity with penicillins, cephalosporins and aztreonam.     AMPICILLIN/SULBACTAM >=32 Resistant     CEFAZOLIN* >=64 Resistant      * Extended spectrum beta-lactamase (ESBL) producing organisms demonstrate decreased activity with penicillins, cephalosporins and aztreonam. For uncomplicated UTI caused by E. coli, K. pneumoniae or P. mirabilis: Cefazolin is susceptible if MIC <32 mcg/mL and predicts susceptible to the oral agents cefaclor, cefdinir, cefpodoxime, cefprozil, cefuroxime, cephalexin and loracarbef.     CEFTAZIDIME 4 Sensitive     CEFEPIME 8 Intermediate     CEFTRIAXONE >=64 Resistant     CIPROFLOXACIN >=4 Resistant     LEVOFLOXACIN >=8 Resistant     GENTAMICIN >=16 Resistant     IMIPENEM <=0.25 Sensitive     NITROFURANTOIN <=16 Sensitive     PIP/TAZO <=4 Sensitive     TOBRAMYCIN 8 Intermediate     TRIMETH/SULFA* >=320 Resistant      * Extended spectrum beta-lactamase (ESBL) producing organisms demonstrate decreased activity with penicillins, cephalosporins and aztreonam. For uncomplicated UTI caused by E. coli, K. pneumoniae or P. mirabilis: Cefazolin is susceptible if MIC <32 mcg/mL and predicts susceptible to the oral agents cefaclor, cefdinir, cefpodoxime, cefprozil, cefuroxime, cephalexin and loracarbef. Legend: S = Susceptible  I = Intermediate R = Resistant  NS = Not susceptible * = Not tested  NR = Not reported **NN = See antimicrobic comments   Urine Culture     Status: Abnormal   Collection Time: 04/05/21  8:56 PM   Specimen: Urine, Clean Catch  Result Value Ref Range Status   Specimen Description URINE, CLEAN CATCH  Final   Special Requests   Final    NONE Performed at [facility], [address] [place], [place] [address]    Culture (A)  Final    >=100,000 COLONIES/mL ESCHERICHIA COLI Confirmed Extended Spectrum Beta-Lactamase Producer (ESBL).  In bloodstream infections  from ESBL organisms, carbapenems are preferred over piperacillin/tazobactam. They are shown to have a lower risk of mortality.    Report Status 04/07/2021 FINAL  Final   Organism ID, Bacteria ESCHERICHIA COLI (A)  Final      Susceptibility   Escherichia coli - MIC*    AMPICILLIN >=32 RESISTANT Resistant     CEFAZOLIN >=64 RESISTANT Resistant     CEFEPIME 16 RESISTANT Resistant     CEFTRIAXONE >=64 RESISTANT Resistant     CIPROFLOXACIN >=4 RESISTANT Resistant     GENTAMICIN >=16 RESISTANT Resistant     IMIPENEM <=0.25 SENSITIVE Sensitive     NITROFURANTOIN <=16 SENSITIVE Sensitive     TRIMETH/SULFA >=320 RESISTANT Resistant     AMPICILLIN/SULBACTAM >=32 RESISTANT Resistant     PIP/TAZO <=4 SENSITIVE Sensitive     * >=100,000 COLONIES/mL ESCHERICHIA COLI  Resp Panel by RT-PCR (Flu A&B, Covid) Nasopharyngeal Swab     Status: None   Collection Time: 04/06/21  1:13 AM   Specimen: Nasopharyngeal Swab; Nasopharyngeal(NP) swabs in vial transport medium  Result Value Ref Range Status   SARS Coronavirus 2 by RT PCR NEGATIVE NEGATIVE Final    Comment: (NOTE) SARS-CoV-2 target nucleic acids are NOT DETECTED.  The SARS-CoV-2 RNA is generally detectable in upper respiratory specimens during the acute phase of infection. The lowest concentration of SARS-CoV-2 viral copies this assay can detect is 138 copies/mL. A negative result does not preclude SARS-Cov-2 infection and should not be  used as the sole basis for treatment or other patient management decisions. A negative result may occur with  improper specimen collection/handling, submission of specimen other than nasopharyngeal swab, presence of viral mutation(s) within the areas targeted by this assay, and inadequate number of viral copies(<138 copies/mL). A negative result must be combined with clinical observations, patient history, and epidemiological information. The expected result is Negative.  Fact Sheet for Patients:   EntrepreneurPulse.com.au  Fact Sheet for Healthcare Providers:  IncredibleEmployment.be  This test is no t yet approved or cleared by the [place] FDA and  has been authorized for detection and/or diagnosis of SARS-CoV-2 by FDA under an Emergency Use Authorization (EUA). This EUA will remain  in effect (meaning this test can be used) for the duration of the COVID-19 declaration under Section 564(b)(1) of the Act, 21 U.S.C.section 360bbb-3(b)(1), unless the authorization is terminated  or revoked sooner.       Influenza A by PCR NEGATIVE NEGATIVE Final   Influenza B by PCR NEGATIVE NEGATIVE Final    Comment: (NOTE) The Xpert Xpress SARS-CoV-2/FLU/RSV plus assay is intended as an aid in the diagnosis of influenza from Nasopharyngeal swab specimens and should not be used as a sole basis for treatment. Nasal washings and aspirates are unacceptable for Xpert Xpress SARS-CoV-2/FLU/RSV testing.  Fact Sheet for Patients: EntrepreneurPulse.com.au  Fact Sheet for Healthcare Providers: IncredibleEmployment.be  This test is not yet approved or cleared by the [place] FDA and has been authorized for detection and/or diagnosis of SARS-CoV-2 by FDA under an Emergency Use Authorization (EUA). This EUA will remain in effect (meaning this test can be used) for the duration of the COVID-19 declaration under Section 564(b)(1) of the Act, 21 U.S.C. section 360bbb-3(b)(1), unless the authorization is terminated or revoked.  Performed at [facility], [place] [address], [place],  [address]      Time coordinating discharge: 25 minutes  SIGNED: Antonieta Pert, MD  [facility] 04/07/2021, 12:08 PM  If 7PM-7AM, please contact night-coverage [email or web address]

## 2021-04-07 NOTE — TOC Initial Note (Signed)
Transition of Care [facility]) - Initial/Assessment Note    Patient Details  Name: Nicole Downs MRN: [id number] Date of Birth: 1924-04-06  Transition of Care [facility]) CM/SW Contact:    Marilu Favre, RN Phone Number: 04/07/2021, 12:25 PM  Clinical Narrative:                 Spoke to patient and daughter [name] at bedside. Patient from home with [name].   Orders for HHPT/OT and aide. Offered choice [name] would like [facility] ( [place]) NCM spoke to [place] with [facility] their PT is pushed out to far to accept referral. [name] aware no preference, Tommi Rumps with [facility] accepted referral  Expected Discharge Plan: [place] Barriers to Discharge: No Barriers Identified   Patient Goals and CMS Choice Patient states their goals for this hospitalization and ongoing recovery are:: to go home CMS Medicare.gov Compare Post Acute Care list provided to:: Patient Represenative (must comment) Choice offered to / list presented to : Adult Children  Expected Discharge Plan and Services Expected Discharge Plan: [place] Acute Care Choice: [place] arrangements for the past 2 months: Single Family Home Expected Discharge Date: 04/07/21                 DME Agency: NA       HH Arranged: PT, OT, Nurse's Aide [place] Agency: [place] Date [facility] Agency Contacted: 04/07/21 Time [place]: 1224 Representative spoke with at [place]: Tommi Rumps  Prior Living Arrangements/Services Living arrangements for the past 2 months: [place] with:: Adult Children Patient language and need for interpreter reviewed:: Yes Do you feel safe going back to the place where you live?: Yes      Need for Family Participation in Patient Care: Yes (Comment) Care giver support system in place?: Yes (comment) Current home services: DME Criminal Activity/Legal Involvement Pertinent to Current Situation/Hospitalization: No - Comment as  needed  Activities of Daily Living      Permission Sought/Granted   Permission granted to share information with : Yes, Verbal Permission Granted  Share Information with NAME: wanda daughter           Emotional Assessment Appearance:: Appears stated age     Orientation: : Oriented to Self, Oriented to Place, Oriented to  Time, Oriented to Situation Alcohol / Substance Use: Not Applicable Psych Involvement: No (comment)  Admission diagnosis:  UTI (urinary tract infection) [N39.0] Patient Active Problem List   Diagnosis Date Noted   Acute encephalopathy 04/06/2021   UTI (urinary tract infection) 04/06/2021   Skin tear of forearm without complication, left, initial encounter 02/01/2021   Pain due to onychomycosis of toenails of both feet 10/20/2019   Glaucoma 04/16/2019   Overgrown toenails 04/16/2019   Anemia 04/16/2019   Recurrent UTI 06/07/2018   Diverticulitis 05/02/2018   Moderate episode of recurrent major depressive disorder ([place]) 04/10/2018   Pulmonary emphysema ([place]) 03/29/2017   Memory loss 08/09/2016   Essential tremor 04/10/2016   Vitamin D deficiency 10/08/2015   Lower extremity edema 10/08/2015   Essential hypertension 06/24/2015   Hypothyroidism 06/24/2015   Dementia ([place]) 06/24/2015   Poor peripheral circulation 06/24/2015   PCP:  Pleas Koch, NP Pharmacy:   [id number] - [place], [place] - [address] [place] [address] Phone: [phone] Fax: [phone]  [facility] [address] [place] [address] Phone: [phone]  Fax: [phone]     Social Determinants of Health (SDOH) Interventions    Readmission Risk Interventions No flowsheet data found.

## 2021-04-07 NOTE — Progress Notes (Signed)
Nsg Discharge Note  Admit Date:  04/05/2021 Discharge date: 04/07/2021   [name] to be D/C'd Home per MD order.  AVS completed.  Patient/caregiver able to verbalize understanding.  Discharge Medication: Allergies as of 04/07/2021       Reactions   Augmentin [amoxicillin-pot Clavulanate] Rash   Amlodipine Other (See Comments)   Ankle edema        Medication List     TAKE these medications    CALCIUM 600 PO Take 600 mg by mouth 2 (two) times daily.   fosfomycin 3 g Pack Commonly known as: MONUROL mix 1 pack (3G) with water and take by mouth once for 1 dose. Start taking on: April 10, 2021   GLUCOSAMINE-CHONDROITIN PO Take 1 capsule by mouth daily.   ibuprofen 200 MG tablet Commonly known as: ADVIL Take 200 mg by mouth at bedtime.   levobunolol 0.5 % ophthalmic solution Commonly known as: BETAGAN Place 1 drop into both eyes 2 (two) times daily.   levothyroxine 25 MCG tablet Commonly known as: SYNTHROID Take 1 tablet by mouth every morning on an empty stomach with water only.  No food or other medications for 30 minutes. Office visit required for further refills. What changed:  how much to take how to take this when to take this additional instructions   losartan 25 MG tablet Commonly known as: COZAAR TAKE 1 TABLET BY MOUTH EVERY DAY FOR BLOOD PRESSURE What changed: See the new instructions.   melatonin 1 MG Tabs tablet Take 1 mg by mouth at bedtime.   REFRESH TEARS OP Place 1 drop into both eyes in the morning and at bedtime.   sertraline 25 MG tablet Commonly known as: ZOLOFT TAKE 1 TABLET BY MOUTH AT BEDTIME FOR DEPRESSION. What changed: See the new instructions.   Vitamin D3 250 MCG (10000 UT) Tabs Take 1,000 mg by mouth daily.        Discharge Assessment: Vitals:   04/07/21 0432 04/07/21 0800  BP: (!) 152/79 (!) 159/73  Pulse: 68 80  Resp: 17 16  Temp: 98.4 F (36.9 C) 98.3 F (36.8 C)  SpO2: 97% 95%   Skin clean, dry  and intact without evidence of skin break down, no evidence of skin tears noted. IV catheter discontinued intact. Site without signs and symptoms of complications - no redness or edema noted at insertion site, patient denies c/o pain - only slight tenderness at site.  Dressing with slight pressure applied.  D/c Instructions-Education: Discharge instructions given to patient/family with verbalized understanding. D/c education completed with patient/family including follow up instructions, medication list, d/c activities limitations if indicated, with other d/c instructions as indicated by MD - patient able to verbalize understanding, all questions fully answered. Patient instructed to return to ED, call 911, or call MD for any changes in condition.  Patient escorted via [place], and D/C home via private auto.  Cayleen Benjamin, Jolene Schimke, RN 04/07/2021 1:48 PM

## 2021-04-07 NOTE — Evaluation (Signed)
Physical Therapy Evaluation Patient Details Name: Nicole Downs MRN: [id number] DOB: 1923/10/13 Today's Date: 04/07/2021  History of Present Illness  Pt is a 87 y/o female admitted 10/18 secondary to AMS. Pt with recent UTI and found to have UTI upon admission. PMH includes dementia, tremors and HTN.  Clinical Impression  Pt admitted secondary to problem above with deficits below. Requiring min guard A for safety with mobility tasks. Cues for safe speed as pt likes to ambulate at a faster gait speed. Per daughter, she is available to assist at d/c as pt lives with her. Recommending HHPT to address current deficits. Will continue to follow acutely.        Recommendations for follow up therapy are one component of a multi-disciplinary discharge planning process, led by the attending physician.  Recommendations may be updated based on patient status, additional functional criteria and insurance authorization.  Follow Up Recommendations Home health PT;Supervision/Assistance - 24 hour    Equipment Recommendations  None recommended by PT    Recommendations for Other Services       Precautions / Restrictions Precautions Precautions: Fall Restrictions Weight Bearing Restrictions: No      Mobility  Bed Mobility Overal bed mobility: Modified Independent                  Transfers Overall transfer level: Needs assistance Equipment used: Rolling walker (2 wheeled) Transfers: Sit to/from Stand Sit to Stand: Min guard         General transfer comment: Min guard for safety. Cues for hand placement.  Ambulation/Gait Ambulation/Gait assistance: Min guard Gait Distance (Feet): 110 Feet Assistive device: Rolling walker (2 wheeled) Gait Pattern/deviations: Step-through pattern;Decreased stride length;Trunk flexed Gait velocity: Decreased   General Gait Details: Very flexed posture throughout, but daughter reports that is baseline. Faster gait speed and required cues for  safe speed. Min guard for safety.  Stairs            Wheelchair Mobility    Modified Rankin (Stroke Patients Only)       Balance Overall balance assessment: Needs assistance Sitting-balance support: No upper extremity supported;Feet supported Sitting balance-Leahy Scale: Fair     Standing balance support: Bilateral upper extremity supported;During functional activity Standing balance-Leahy Scale: Poor Standing balance comment: Reliant on BUE support                             Pertinent Vitals/Pain Pain Assessment: No/denies pain    Home Living Family/patient expects to be discharged to:: Private residence Living Arrangements: Children Available Help at Discharge: Family;Available 24 hours/day Type of Home: House Home Access: Stairs to enter Entrance Stairs-Rails: Left Entrance Stairs-Number of Steps: 8 Home Layout: Multi-level;Able to live on main level with bedroom/bathroom Home Equipment: Gilford Rile - 2 wheels;Shower seat;Bedside commode      Prior Function Level of Independence: Needs assistance   Gait / Transfers Assistance Needed: Pt able to ambulate with RW  ADL's / Homemaking Assistance Needed: Pt able to bathe herself, but daughter provides supervision throughout. Also supervision for dressing.        Hand Dominance        Extremity/Trunk Assessment   Upper Extremity Assessment Upper Extremity Assessment: Defer to OT evaluation    Lower Extremity Assessment Lower Extremity Assessment: Generalized weakness    Cervical / Trunk Assessment Cervical / Trunk Assessment: Kyphotic;Other exceptions Cervical / Trunk Exceptions: tremors at head and neck  Communication   Communication: [facility]  Cognition  Arousal/Alertness: Awake/alert Behavior During Therapy: WFL for tasks assessed/performed Overall Cognitive Status: History of cognitive impairments - at baseline                                 General Comments: Dementia at  baseline. Pt's daughter reports she is slightly off from baseline      General Comments General comments (skin integrity, edema, etc.): Pt's daughter present throughout    Exercises     Assessment/Plan    PT Assessment Patient needs continued PT services  PT Problem List Decreased strength;Decreased activity tolerance;Decreased mobility;Decreased balance;Decreased knowledge of use of DME;Decreased cognition;Decreased knowledge of precautions;Decreased safety awareness       PT Treatment Interventions DME instruction;Gait training;Functional mobility training;Therapeutic activities;Stair training;Therapeutic exercise;Balance training;Patient/family education    PT Goals (Current goals can be found in the Care Plan section)  Acute Rehab PT Goals Patient Stated Goal: to go home PT Goal Formulation: With patient/family Time For Goal Achievement: 04/21/21 Potential to Achieve Goals: Good    Frequency Min 3X/week   Barriers to discharge        Co-evaluation               AM-PAC PT "6 Clicks" Mobility  Outcome Measure Help needed turning from your back to your side while in a flat bed without using bedrails?: None Help needed moving from lying on your back to sitting on the side of a flat bed without using bedrails?: None Help needed moving to and from a bed to a chair (including a wheelchair)?: A Little Help needed standing up from a chair using your arms (e.g., wheelchair or bedside chair)?: A Little Help needed to walk in hospital room?: A Little Help needed climbing 3-5 steps with a railing? : A Lot 6 Click Score: 19    End of Session Equipment Utilized During Treatment: Gait belt Activity Tolerance: Patient tolerated treatment well Patient left: in bed;with call bell/phone within reach;with family/visitor present Nurse Communication: Mobility status PT Visit Diagnosis: Unsteadiness on feet (R26.81);Muscle weakness (generalized) (M62.81)    Time: 2585-2778 PT  Time Calculation (min) (ACUTE ONLY): 19 min   Charges:   PT Evaluation $PT Eval Low Complexity: 1 Low          Lou Miner, DPT  Acute Rehabilitation Services  Pager: [phone] Office: [phone]   Rudean Hitt 04/07/2021, 11:35 AM

## 2021-04-09 DIAGNOSIS — K449 Diaphragmatic hernia without obstruction or gangrene: Secondary | ICD-10-CM | POA: Diagnosis not present

## 2021-04-09 DIAGNOSIS — J439 Emphysema, unspecified: Secondary | ICD-10-CM | POA: Diagnosis not present

## 2021-04-09 DIAGNOSIS — F02811 Dementia in other diseases classified elsewhere, unspecified severity, with agitation: Secondary | ICD-10-CM | POA: Diagnosis not present

## 2021-04-09 DIAGNOSIS — N3000 Acute cystitis without hematuria: Secondary | ICD-10-CM | POA: Diagnosis not present

## 2021-04-09 DIAGNOSIS — F02818 Dementia in other diseases classified elsewhere, unspecified severity, with other behavioral disturbance: Secondary | ICD-10-CM | POA: Diagnosis not present

## 2021-04-09 DIAGNOSIS — Z1612 Extended spectrum beta lactamase (ESBL) resistance: Secondary | ICD-10-CM | POA: Diagnosis not present

## 2021-04-09 DIAGNOSIS — Z791 Long term (current) use of non-steroidal anti-inflammatories (NSAID): Secondary | ICD-10-CM | POA: Diagnosis not present

## 2021-04-09 DIAGNOSIS — Z85828 Personal history of other malignant neoplasm of skin: Secondary | ICD-10-CM | POA: Diagnosis not present

## 2021-04-09 DIAGNOSIS — Z9183 Wandering in diseases classified elsewhere: Secondary | ICD-10-CM | POA: Diagnosis not present

## 2021-04-09 DIAGNOSIS — E039 Hypothyroidism, unspecified: Secondary | ICD-10-CM | POA: Diagnosis not present

## 2021-04-09 DIAGNOSIS — I10 Essential (primary) hypertension: Secondary | ICD-10-CM | POA: Diagnosis not present

## 2021-04-09 DIAGNOSIS — M1990 Unspecified osteoarthritis, unspecified site: Secondary | ICD-10-CM | POA: Diagnosis not present

## 2021-04-09 DIAGNOSIS — G319 Degenerative disease of nervous system, unspecified: Secondary | ICD-10-CM | POA: Diagnosis not present

## 2021-04-09 DIAGNOSIS — H409 Unspecified glaucoma: Secondary | ICD-10-CM | POA: Diagnosis not present

## 2021-04-09 DIAGNOSIS — Z9181 History of falling: Secondary | ICD-10-CM | POA: Diagnosis not present

## 2021-04-10 ENCOUNTER — Other Ambulatory Visit: Payer: Self-pay | Admitting: Primary Care

## 2021-04-10 DIAGNOSIS — I10 Essential (primary) hypertension: Secondary | ICD-10-CM

## 2021-04-11 DIAGNOSIS — F02818 Dementia in other diseases classified elsewhere, unspecified severity, with other behavioral disturbance: Secondary | ICD-10-CM | POA: Diagnosis not present

## 2021-04-11 DIAGNOSIS — Z1612 Extended spectrum beta lactamase (ESBL) resistance: Secondary | ICD-10-CM | POA: Diagnosis not present

## 2021-04-11 DIAGNOSIS — Z9183 Wandering in diseases classified elsewhere: Secondary | ICD-10-CM | POA: Diagnosis not present

## 2021-04-11 DIAGNOSIS — N3000 Acute cystitis without hematuria: Secondary | ICD-10-CM | POA: Diagnosis not present

## 2021-04-11 DIAGNOSIS — G319 Degenerative disease of nervous system, unspecified: Secondary | ICD-10-CM | POA: Diagnosis not present

## 2021-04-11 DIAGNOSIS — F02811 Dementia in other diseases classified elsewhere, unspecified severity, with agitation: Secondary | ICD-10-CM | POA: Diagnosis not present

## 2021-04-11 LAB — CULTURE, BLOOD (ROUTINE X 2)
Culture: NO GROWTH
Culture: NO GROWTH
Special Requests: ADEQUATE
Special Requests: ADEQUATE

## 2021-04-12 ENCOUNTER — Telehealth: Payer: Self-pay | Admitting: Primary Care

## 2021-04-12 NOTE — Telephone Encounter (Signed)
Approved.  

## 2021-04-12 NOTE — Telephone Encounter (Signed)
Home Health verbal orders [place] Name: [facility] number: [phone]  Requesting OT/PT/Skilled nursing/Social Work/Speech:  Reason:PT  Frequency:1 wk for 1 wk 2 wk for 3 wk 1 wk for 1 wk  Please forward to [facility] pool or providers CMA

## 2021-04-13 DIAGNOSIS — F02818 Dementia in other diseases classified elsewhere, unspecified severity, with other behavioral disturbance: Secondary | ICD-10-CM | POA: Diagnosis not present

## 2021-04-13 DIAGNOSIS — Z9183 Wandering in diseases classified elsewhere: Secondary | ICD-10-CM | POA: Diagnosis not present

## 2021-04-13 DIAGNOSIS — G319 Degenerative disease of nervous system, unspecified: Secondary | ICD-10-CM | POA: Diagnosis not present

## 2021-04-13 DIAGNOSIS — N3000 Acute cystitis without hematuria: Secondary | ICD-10-CM | POA: Diagnosis not present

## 2021-04-13 DIAGNOSIS — F02811 Dementia in other diseases classified elsewhere, unspecified severity, with agitation: Secondary | ICD-10-CM | POA: Diagnosis not present

## 2021-04-13 DIAGNOSIS — Z1612 Extended spectrum beta lactamase (ESBL) resistance: Secondary | ICD-10-CM | POA: Diagnosis not present

## 2021-04-13 NOTE — Telephone Encounter (Signed)
Called and gave verbal ok to [place]. No further action needed at this time.

## 2021-04-14 ENCOUNTER — Telehealth: Payer: Self-pay | Admitting: Primary Care

## 2021-04-14 NOTE — Telephone Encounter (Signed)
Can she come in tomorrow for labs and UA with catheterization?  This would be the best approach.

## 2021-04-14 NOTE — Telephone Encounter (Signed)
Pt daughter called and would like a call back. Pt daughter states that she think pt has another UTI.

## 2021-04-14 NOTE — Telephone Encounter (Signed)
Called patient daughter app has been made. Given all information and answered questions.

## 2021-04-14 NOTE — Telephone Encounter (Signed)
Did you want her to have another Urine test or does she need to be seen.

## 2021-04-14 NOTE — Telephone Encounter (Signed)
Called patient see phone note for documentation.

## 2021-04-15 ENCOUNTER — Other Ambulatory Visit: Payer: Medicare Other

## 2021-04-15 ENCOUNTER — Encounter: Payer: Self-pay | Admitting: Primary Care

## 2021-04-15 ENCOUNTER — Other Ambulatory Visit: Payer: Self-pay

## 2021-04-15 ENCOUNTER — Ambulatory Visit (INDEPENDENT_AMBULATORY_CARE_PROVIDER_SITE_OTHER)
Admission: RE | Admit: 2021-04-15 | Discharge: 2021-04-15 | Disposition: A | Discharge status: Home / Self Care | Payer: Medicare Other | Source: Ambulatory Visit | Attending: Primary Care | Admitting: Primary Care

## 2021-04-15 ENCOUNTER — Ambulatory Visit (INDEPENDENT_AMBULATORY_CARE_PROVIDER_SITE_OTHER): Payer: Medicare Other | Admitting: Primary Care

## 2021-04-15 VITALS — BP 148/88 | HR 75 | Temp 97.6°F | Ht <= 58 in | Wt 112.0 lb

## 2021-04-15 DIAGNOSIS — N3000 Acute cystitis without hematuria: Secondary | ICD-10-CM | POA: Diagnosis not present

## 2021-04-15 DIAGNOSIS — R051 Acute cough: Secondary | ICD-10-CM | POA: Diagnosis not present

## 2021-04-15 DIAGNOSIS — K449 Diaphragmatic hernia without obstruction or gangrene: Secondary | ICD-10-CM | POA: Diagnosis not present

## 2021-04-15 DIAGNOSIS — N390 Urinary tract infection, site not specified: Secondary | ICD-10-CM | POA: Diagnosis not present

## 2021-04-15 DIAGNOSIS — D649 Anemia, unspecified: Secondary | ICD-10-CM

## 2021-04-15 DIAGNOSIS — R059 Cough, unspecified: Secondary | ICD-10-CM | POA: Diagnosis not present

## 2021-04-15 DIAGNOSIS — U071 COVID-19: Secondary | ICD-10-CM | POA: Diagnosis not present

## 2021-04-15 LAB — POC URINALSYSI DIPSTICK (AUTOMATED)
Bilirubin, UA: NEGATIVE
Blood, UA: NEGATIVE
Glucose, UA: NEGATIVE
Ketones, UA: NEGATIVE
Leukocytes, UA: NEGATIVE
Nitrite, UA: NEGATIVE
Protein, UA: POSITIVE — AB
Spec Grav, UA: 1.015 (ref 1.010–1.025)
Urobilinogen, UA: 0.2 E.U./dL
pH, UA: 6 (ref 5.0–8.0)

## 2021-04-15 MED ORDER — ESTRADIOL 0.1 MG/GM VA CREA: 1.0000 | TOPICAL_CREAM | VAGINAL | 2 refills | Status: DC

## 2021-04-15 NOTE — Assessment & Plan Note (Addendum)
Recent hospitalization, second UTI within five weeks. Prior history of UTI's.  UA today collected via catheterization, sterile procedure.  Patient and daughter consented.  UA today negative. Culture sent to lab.  Discussed UTI prevention with use of cranberry tablets, probiotics, increase hydration with water.  Prescription for Estrace cream sent to pharmacy to use 3 times weekly for UTI prevention.  Daughter verbalized understanding for instructions for use.  Labs pending.  Checking chest x-ray given recent cough  Hospital notes, labs, imaging reviewed.

## 2021-04-15 NOTE — Progress Notes (Signed)
Subjective:    Patient ID: Nicole Downs, female    DOB: 1924-03-16, 87 y.o.   MRN: [id number]  HPI  Nicole Downs is a very pleasant 87 y.o. female with a history of tremor, recurrent UTI, dementia, hypertension who presents today with her daughter for hospital follow up.  Her daughter brought her to [facility] ED on 04/06/21 for symptoms of aggressive behavior, increased confusion. Urinalysis consistent for cystitis. Urine culture returned fro [facility] with numerous resistance noted on sensitivity report. She was admitted and treated with fosfomycin and Rocephin. Her behavior began to normalize so she was discharged home on 04/07/21 with a prescription to repeat fosfomycin and PCP follow up.  She was treated at [facility] ED approximately one month prior for acute confusion, diagnosed and treated for acute cystitis with IV fluids and Rocephin. She was discharged home with oral Keflex.   Today her daughter believes the UTI has returned. She took her mother to the hair dresser yesterday and her mother became aggressive and confused. She slept during the night last night. She denies fevers,   She's also developed a mild cough two days ago with rhinorrhea, cough has progressed. She is alert and oriented at home for the most part per her daughter.   She's been visited by home health PT twice. Her last dose of fosfomycin was five days ago.   Review of Systems  Constitutional:  Negative for chills, fatigue and fever.  HENT:  Positive for congestion.   Respiratory:  Positive for cough. Negative for shortness of breath.   Genitourinary:  Negative for dysuria and frequency.  Psychiatric/Behavioral:  Positive for confusion.         Past Medical History:  Diagnosis Date   Actinic keratosis    Acute confusion    Arthritis    Basal cell carcinoma 11/01/2015   L lat ankle   Basal cell carcinoma 12/13/2015   L dorsum foot   Basal cell carcinoma 09/12/2017   nasal root, R nasal bridge, L nasal bridge,  R mandible infra auricular   Basal cell carcinoma 10/24/2017   R lateral foot   Basal cell carcinoma 12/05/2017   L dorsum foot   Basal cell carcinoma 11/27/2018   R of midline mid forehead   Basal cell carcinoma 06/25/2019   nasal bridge   Basal cell carcinoma 12/01/2019   Junction of right mandible and earlobe. Nodular pattern.   Emphysema lung (HCC)    Glaucoma    Hypertension    Hyponatremia    Hypothyroidism    Memory loss    Poor peripheral circulation    Squamous cell carcinoma of skin 09/20/2015   R prox mandible/in situ   Squamous cell carcinoma of skin 02/01/2016   nose   Squamous cell carcinoma of skin 10/24/2017   R lateral knee/in situ, R lat ankle/in situ   Squamous cell carcinoma of skin 12/05/2017   L medial calf/in situ   Squamous cell carcinoma of skin 12/01/2019   Right neck sup. SCCis, hypertrophic EDC, 03/30/21 LN2 for recurrence   Squamous cell carcinoma of skin 12/01/2019   Right neck inf.  SCCis, hypertrophic EDC, 03/30/21 LN2 fo recurrence   Squamous cell carcinoma of skin 03/02/2020   Left upper arm, proximal. SCCis   Squamous cell carcinoma of skin 03/02/2020   Left upper arm, distal. SCCis   Tremors of nervous system     Social History   Socioeconomic History   Marital status: Single    Spouse name: Not  on file   Number of children: 1   Years of education: 11th grade   Highest education level: Not on file  Occupational History   Occupation: Retired  Tobacco Use   Smoking status: Never   Smokeless tobacco: Never  Vaping Use   Vaping Use: Never used  Substance and Sexual Activity   Alcohol use: No    Alcohol/week: 0.0 standard drinks   Drug use: No   Sexual activity: Not Currently  Other Topics Concern   Not on file  Social History Narrative   Lives with daughter and son-in-law.   Right-handed.   Drinks one soda per day.   Social Determinants of Health   Financial Resource Strain: Low Risk    Difficulty of Paying Living  Expenses: Not hard at all  Food Insecurity: No Food Insecurity   Worried About Charity fundraiser in the Last Year: Never true   [place] in the Last Year: Never true  Transportation Needs: No Transportation Needs   Lack of Transportation (Medical): No   Lack of Transportation (Non-Medical): No  Physical Activity: Inactive   Days of Exercise per Week: 0 days   Minutes of Exercise per Session: 0 min  Stress: No Stress Concern Present   Feeling of Stress : Not at all  Social Connections: Not on file  Intimate Partner Violence: Not At Risk   Fear of Current or Ex-Partner: No   Emotionally Abused: No   Physically Abused: No   Sexually Abused: No    Past Surgical History:  Procedure Laterality Date   BREAST BIOPSY      Family History  Problem Relation Age of Onset   Heart attack Mother    Other Father        unsure - she was only 50 years old when he died    Allergies  Allergen Reactions   Augmentin [Amoxicillin-Pot Clavulanate] Rash   Amlodipine Other (See Comments)    Ankle edema    Current Outpatient Medications on File Prior to Visit  Medication Sig Dispense Refill   Calcium Carbonate (CALCIUM 600 PO) Take 600 mg by mouth 2 (two) times daily.     Carboxymethylcellulose Sodium (REFRESH TEARS OP) Place 1 drop into both eyes in the morning and at bedtime.     Cholecalciferol (VITAMIN D3) 10000 units TABS Take 1,000 mg by mouth daily.     GLUCOSAMINE-CHONDROITIN PO Take 1 capsule by mouth daily.     ibuprofen (ADVIL) 200 MG tablet Take 200 mg by mouth at bedtime.     levobunolol (BETAGAN) 0.5 % ophthalmic solution Place 1 drop into both eyes 2 (two) times daily.     levothyroxine (SYNTHROID) 25 MCG tablet Take 1 tablet by mouth every morning on an empty stomach with water only.  No food or other medications for 30 minutes. Office visit required for further refills. (Patient taking differently: Take 25 mcg by mouth daily before breakfast.) 90 tablet 0   losartan  (COZAAR) 25 MG tablet TAKE 1 TABLET BY MOUTH EVERY DAY FOR BLOOD PRESSURE 90 tablet 3   melatonin 1 MG TABS tablet Take 1 mg by mouth at bedtime.     sertraline (ZOLOFT) 25 MG tablet TAKE 1 TABLET BY MOUTH AT BEDTIME FOR DEPRESSION. (Patient taking differently: Take 25 mg by mouth at bedtime.) 90 tablet 1   No current facility-administered medications on file prior to visit.    BP (!) 148/88   Pulse 75   Temp 97.6  F (36.4 C) (Temporal)   Ht 4\' 9"  (1.448 m)   Wt 112 lb (50.8 kg)   SpO2 95%   BMI 24.24 kg/m  Objective:   Physical Exam Cardiovascular:     Rate and Rhythm: Normal rate and regular rhythm.  Pulmonary:     Effort: Pulmonary effort is normal.     Breath sounds: Examination of the right-middle field reveals rhonchi. Examination of the right-lower field reveals rhonchi. Rhonchi present.  Musculoskeletal:     Cervical back: Neck supple.  Skin:    General: Skin is warm and dry.  Neurological:     Mental Status: She is alert.     Comments: Follows commands  Psychiatric:        Mood and Affect: Mood normal.     Comments: No resistant or aggressive behavior           Assessment & Plan:      This visit occurred during the SARS-CoV-2 public health emergency.  Safety protocols were in place, including screening questions prior to the visit, additional usage of staff PPE, and extensive cleaning of exam room while observing appropriate contact time as indicated for disinfecting solutions.

## 2021-04-15 NOTE — Assessment & Plan Note (Signed)
For two days, sounds congested.  Checking chest xray given recent hospitalization. Checking labs today  Hold off on antibiotic treatment at this time.  Discussed use of Mucinex.

## 2021-04-15 NOTE — Patient Instructions (Addendum)
Stop by the lab and xray prior to leaving today. I will notify you of your results once received.   Start cranberry tablets once daily. Start a daily probiotic.   Start the estrogen cream. Apply vaginally three times weekly for UTI prevention.  Try to increase water intake.   I will be in touch in a few days.  It was a pleasure to see you today!

## 2021-04-16 LAB — URINE CULTURE
MICRO NUMBER:: 12565828
Result:: NO GROWTH
SPECIMEN QUALITY:: ADEQUATE

## 2021-04-16 LAB — COMPREHENSIVE METABOLIC PANEL
AG Ratio: 1.3 (calc) (ref 1.0–2.5)
ALT: 9 U/L (ref 6–29)
AST: 18 U/L (ref 10–35)
Albumin: 4.1 g/dL (ref 3.6–5.1)
Alkaline phosphatase (APISO): 61 U/L (ref 37–153)
BUN/Creatinine Ratio: 18 (calc) (ref 6–22)
BUN: 19 mg/dL (ref 7–25)
CO2: 27 mmol/L (ref 20–32)
Calcium: 10.4 mg/dL (ref 8.6–10.4)
Chloride: 102 mmol/L (ref 98–110)
Creat: 1.08 mg/dL — ABNORMAL HIGH (ref 0.60–0.95)
Globulin: 3.2 g/dL (calc) (ref 1.9–3.7)
Glucose, Bld: 93 mg/dL (ref 65–99)
Potassium: 4.8 mmol/L (ref 3.5–5.3)
Sodium: 141 mmol/L (ref 135–146)
Total Bilirubin: 0.6 mg/dL (ref 0.2–1.2)
Total Protein: 7.3 g/dL (ref 6.1–8.1)

## 2021-04-16 LAB — CBC WITH DIFFERENTIAL/PLATELET
Absolute Monocytes: 1038 cells/uL — ABNORMAL HIGH (ref 200–950)
Basophils Absolute: 53 cells/uL (ref 0–200)
Basophils Relative: 0.6 %
Eosinophils Absolute: 158 cells/uL (ref 15–500)
Eosinophils Relative: 1.8 %
HCT: 38.6 % (ref 35.0–45.0)
Hemoglobin: 13 g/dL (ref 11.7–15.5)
Lymphs Abs: 2798 cells/uL (ref 850–3900)
MCH: 31.9 pg (ref 27.0–33.0)
MCHC: 33.7 g/dL (ref 32.0–36.0)
MCV: 94.6 fL (ref 80.0–100.0)
MPV: 12.3 fL (ref 7.5–12.5)
Monocytes Relative: 11.8 %
Neutro Abs: 4752 cells/uL (ref 1500–7800)
Neutrophils Relative %: 54 %
Platelets: 304 10*3/uL (ref 140–400)
RBC: 4.08 10*6/uL (ref 3.80–5.10)
RDW: 12.6 % (ref 11.0–15.0)
Total Lymphocyte: 31.8 %
WBC: 8.8 10*3/uL (ref 3.8–10.8)

## 2021-04-18 ENCOUNTER — Ambulatory Visit (INDEPENDENT_AMBULATORY_CARE_PROVIDER_SITE_OTHER): Payer: Medicare Other

## 2021-04-18 VITALS — Ht 60.0 in | Wt 112.0 lb

## 2021-04-18 DIAGNOSIS — F02818 Dementia in other diseases classified elsewhere, unspecified severity, with other behavioral disturbance: Secondary | ICD-10-CM | POA: Diagnosis not present

## 2021-04-18 DIAGNOSIS — G319 Degenerative disease of nervous system, unspecified: Secondary | ICD-10-CM | POA: Diagnosis not present

## 2021-04-18 DIAGNOSIS — Z0000 Encounter for general adult medical examination without abnormal findings: Secondary | ICD-10-CM | POA: Diagnosis not present

## 2021-04-18 DIAGNOSIS — Z1612 Extended spectrum beta lactamase (ESBL) resistance: Secondary | ICD-10-CM | POA: Diagnosis not present

## 2021-04-18 DIAGNOSIS — N3000 Acute cystitis without hematuria: Secondary | ICD-10-CM | POA: Diagnosis not present

## 2021-04-18 DIAGNOSIS — F02811 Dementia in other diseases classified elsewhere, unspecified severity, with agitation: Secondary | ICD-10-CM | POA: Diagnosis not present

## 2021-04-18 DIAGNOSIS — Z9183 Wandering in diseases classified elsewhere: Secondary | ICD-10-CM | POA: Diagnosis not present

## 2021-04-18 NOTE — Patient Instructions (Signed)
Nicole Downs , Thank you for taking time to complete your Medicare Wellness Visit. I appreciate your ongoing commitment to your health goals. Please review the following plan we discussed and let me know if I can assist you in the future.   Screening recommendations/referrals: Colonoscopy: No longer required Mammogram: No longer required Bone Density: No longer required Recommended yearly ophthalmology/optometry visit for glaucoma screening and checkup Recommended yearly dental visit for hygiene and checkup  Vaccinations: Influenza vaccine: Due-May obtain vaccine at our office or your local pharmacy.  Pneumococcal vaccine: up to date Tdap vaccine: up to date, completed 05/17/19 due 04/15/29 Shingles vaccine: discuss with pharmacy   Covid-19:discuss with pharmacy and PCP  Advanced directives: yes, please bring copy to add to your chart  Conditions/risks identified: see problem list.  Next appointment: Follow up in one year for your annual wellness visit    Preventive Care 65 Years and Older, Female Preventive care refers to lifestyle choices and visits with your health care provider that can promote health and wellness. What does preventive care include? A yearly physical exam. This is also called an annual well check. Dental exams once or twice a year. Routine eye exams. Ask your health care provider how often you should have your eyes checked. Personal lifestyle choices, including: Daily care of your teeth and gums. Regular physical activity. Eating a healthy diet. Avoiding tobacco and drug use. Limiting alcohol use. Practicing safe sex. Taking low-dose aspirin every day. Taking vitamin and mineral supplements as recommended by your health care provider. What happens during an annual well check? The services and screenings done by your health care provider during your annual well check will depend on your age, overall health, lifestyle risk factors, and family history of  disease. Counseling  Your health care provider may ask you questions about your: Alcohol use. Tobacco use. Drug use. Emotional well-being. Home and relationship well-being. Sexual activity. Eating habits. History of falls. Memory and ability to understand (cognition). Work and work Statistician. Reproductive health. Screening  You may have the following tests or measurements: Height, weight, and BMI. Blood pressure. Lipid and cholesterol levels. These may be checked every 5 years, or more frequently if you are over 15 years old. Skin check. Lung cancer screening. You may have this screening every year starting at age 36 if you have a 30-pack-year history of smoking and currently smoke or have quit within the past 15 years. Fecal occult blood test (FOBT) of the stool. You may have this test every year starting at age 63. Flexible sigmoidoscopy or colonoscopy. You may have a sigmoidoscopy every 5 years or a colonoscopy every 10 years starting at age 40. Hepatitis C blood test. Hepatitis B blood test. Sexually transmitted disease (STD) testing. Diabetes screening. This is done by checking your blood sugar (glucose) after you have not eaten for a while (fasting). You may have this done every 1-3 years. Bone density scan. This is done to screen for osteoporosis. You may have this done starting at age 45. Mammogram. This may be done every 1-2 years. Talk to your health care provider about how often you should have regular mammograms. Talk with your health care provider about your test results, treatment options, and if necessary, the need for more tests. Vaccines  Your health care provider may recommend certain vaccines, such as: Influenza vaccine. This is recommended every year. Tetanus, diphtheria, and acellular pertussis (Tdap, Td) vaccine. You may need a Td booster every 10 years. Zoster vaccine. You may need  this after age 25. Pneumococcal 13-valent conjugate (PCV13) vaccine. One  dose is recommended after age 92. Pneumococcal polysaccharide (PPSV23) vaccine. One dose is recommended after age 64. Talk to your health care provider about which screenings and vaccines you need and how often you need them. This information is not intended to replace advice given to you by your health care provider. Make sure you discuss any questions you have with your health care provider. Document Released: 07/02/2015 Document Revised: 02/23/2016 Document Reviewed: 04/06/2015 Elsevier Interactive Patient Education  2017 [place] Prevention in the Home Falls can cause injuries. They can happen to people of all ages. There are many things you can do to make your home safe and to help prevent falls. What can I do on the outside of my home? Regularly fix the edges of walkways and driveways and fix any cracks. Remove anything that might make you trip as you walk through a door, such as a raised step or threshold. Trim any bushes or trees on the path to your home. Use bright outdoor lighting. Clear any walking paths of anything that might make someone trip, such as rocks or tools. Regularly check to see if handrails are loose or broken. Make sure that both sides of any steps have handrails. Any raised decks and porches should have guardrails on the edges. Have any leaves, snow, or ice cleared regularly. Use sand or salt on walking paths during winter. Clean up any spills in your garage right away. This includes oil or grease spills. What can I do in the bathroom? Use night lights. Install grab bars by the toilet and in the tub and shower. Do not use towel bars as grab bars. Use non-skid mats or decals in the tub or shower. If you need to sit down in the shower, use a plastic, non-slip stool. Keep the floor dry. Clean up any water that spills on the floor as soon as it happens. Remove soap buildup in the tub or shower regularly. Attach bath mats securely with double-sided  non-slip rug tape. Do not have throw rugs and other things on the floor that can make you trip. What can I do in the bedroom? Use night lights. Make sure that you have a light by your bed that is easy to reach. Do not use any sheets or blankets that are too big for your bed. They should not hang down onto the floor. Have a firm chair that has side arms. You can use this for support while you get dressed. Do not have throw rugs and other things on the floor that can make you trip. What can I do in the kitchen? Clean up any spills right away. Avoid walking on wet floors. Keep items that you use a lot in easy-to-reach places. If you need to reach something above you, use a strong step stool that has a grab bar. Keep electrical cords out of the way. Do not use floor polish or wax that makes floors slippery. If you must use wax, use non-skid floor wax. Do not have throw rugs and other things on the floor that can make you trip. What can I do with my stairs? Do not leave any items on the stairs. Make sure that there are handrails on both sides of the stairs and use them. Fix handrails that are broken or loose. Make sure that handrails are as long as the stairways. Check any carpeting to make sure that it is firmly attached to  the stairs. Fix any carpet that is loose or worn. Avoid having throw rugs at the top or bottom of the stairs. If you do have throw rugs, attach them to the floor with carpet tape. Make sure that you have a light switch at the top of the stairs and the bottom of the stairs. If you do not have them, ask someone to add them for you. What else can I do to help prevent falls? Wear shoes that: Do not have high heels. Have rubber bottoms. Are comfortable and fit you well. Are closed at the toe. Do not wear sandals. If you use a stepladder: Make sure that it is fully opened. Do not climb a closed stepladder. Make sure that both sides of the stepladder are locked into place. Ask  someone to hold it for you, if possible. Clearly mark and make sure that you can see: Any grab bars or handrails. First and last steps. Where the edge of each step is. Use tools that help you move around (mobility aids) if they are needed. These include: Canes. Walkers. Scooters. Crutches. Turn on the lights when you go into a dark area. Replace any light bulbs as soon as they burn out. Set up your furniture so you have a clear path. Avoid moving your furniture around. If any of your floors are uneven, fix them. If there are any pets around you, be aware of where they are. Review your medicines with your doctor. Some medicines can make you feel dizzy. This can increase your chance of falling. Ask your doctor what other things that you can do to help prevent falls. This information is not intended to replace advice given to you by your health care provider. Make sure you discuss any questions you have with your health care provider. Document Released: 04/01/2009 Document Revised: 11/11/2015 Document Reviewed: 07/10/2014 Elsevier Interactive Patient Education  2017 [employer].

## 2021-04-18 NOTE — Progress Notes (Signed)
Subjective:   Nicole Downs is a 87 y.o. female who presents for Medicare Annual (Subsequent) preventive examination.  I connected with [name] today by telephone and verified that I am speaking with the correct person using two identifiers. Location patient: home Location provider: work Persons participating in the virtual visit: patient, daughter - Talmadge Coventry, nurse.    I discussed the limitations, risks, security and privacy concerns of performing an evaluation and management service by telephone and the availability of in person appointments. I also discussed with the patient that there may be a patient responsible charge related to this service. The patient expressed understanding and verbally consented to this telephonic visit.    Interactive audio and video telecommunications were attempted between this provider and patient, however failed, due to patient having technical difficulties OR patient did not have access to video capability.  We continued and completed visit with audio only.  Some vital signs may be absent or patient reported.   Time Spent with patient on telephone encounter: 30 minutes   Review of Systems     Cardiac Risk Factors include: advanced age (>26men, >54 women);hypertension     Objective:    Today's Vitals   04/18/21 0901 04/18/21 0902  Weight: 112 lb (50.8 kg)   Height: 5' (1.524 m)   PainSc:  0-No pain   Body mass index is 21.87 kg/m.  Advanced Directives 04/18/2021 04/05/2021 04/16/2020 04/15/2019 05/01/2018 04/10/2018  Does Patient Have a Medical Advance Directive? Yes No No Yes Yes Yes  Type of Advance Directive [place];Living will Healthcare Power of [place]  Does patient want to make changes to medical advance directive? Yes (MAU/Ambulatory/Procedural Areas - Information given) - - - - -  Copy of [place] in Chart? No -  copy requested - - No - copy requested No - copy requested No - copy requested  Would patient like information on creating a medical advance directive? Yes (MAU/Ambulatory/Procedural Areas - Information given) No - Patient declined;No - Guardian declined No - Patient declined - - -    Current Medications (verified) Outpatient Encounter Medications as of 04/18/2021  Medication Sig   Calcium Carbonate (CALCIUM 600 PO) Take 600 mg by mouth 2 (two) times daily.   Carboxymethylcellulose Sodium (REFRESH TEARS OP) Place 1 drop into both eyes in the morning and at bedtime.   Cholecalciferol (VITAMIN D3) 10000 units TABS Take 1,000 mg by mouth daily.   estradiol (ESTRACE VAGINAL) 0.1 MG/GM vaginal cream Place 1 Applicatorful vaginally 3 (three) times a week. For UTI prevention.   GLUCOSAMINE-CHONDROITIN PO Take 1 capsule by mouth daily.   ibuprofen (ADVIL) 200 MG tablet Take 200 mg by mouth at bedtime.   levobunolol (BETAGAN) 0.5 % ophthalmic solution Place 1 drop into both eyes 2 (two) times daily.   levothyroxine (SYNTHROID) 25 MCG tablet Take 1 tablet by mouth every morning on an empty stomach with water only.  No food or other medications for 30 minutes. Office visit required for further refills. (Patient taking differently: Take 25 mcg by mouth daily before breakfast.)   losartan (COZAAR) 25 MG tablet TAKE 1 TABLET BY MOUTH EVERY DAY FOR BLOOD PRESSURE   melatonin 1 MG TABS tablet Take 1 mg by mouth at bedtime.   sertraline (ZOLOFT) 25 MG tablet TAKE 1 TABLET BY MOUTH AT BEDTIME FOR DEPRESSION. (Patient taking differently: Take 25 mg by mouth at bedtime.)   No  facility-administered encounter medications on file as of 04/18/2021.    Allergies (verified) Augmentin [amoxicillin-pot clavulanate] and Amlodipine   History: Past Medical History:  Diagnosis Date   Actinic keratosis    Acute confusion    Arthritis    Basal cell carcinoma 11/01/2015   L lat ankle   Basal cell carcinoma 12/13/2015    L dorsum foot   Basal cell carcinoma 09/12/2017   nasal root, R nasal bridge, L nasal bridge, R mandible infra auricular   Basal cell carcinoma 10/24/2017   R lateral foot   Basal cell carcinoma 12/05/2017   L dorsum foot   Basal cell carcinoma 11/27/2018   R of midline mid forehead   Basal cell carcinoma 06/25/2019   nasal bridge   Basal cell carcinoma 12/01/2019   Junction of right mandible and earlobe. Nodular pattern.   Emphysema lung (HCC)    Glaucoma    Hypertension    Hyponatremia    Hypothyroidism    Memory loss    Poor peripheral circulation    Squamous cell carcinoma of skin 09/20/2015   R prox mandible/in situ   Squamous cell carcinoma of skin 02/01/2016   nose   Squamous cell carcinoma of skin 10/24/2017   R lateral knee/in situ, R lat ankle/in situ   Squamous cell carcinoma of skin 12/05/2017   L medial calf/in situ   Squamous cell carcinoma of skin 12/01/2019   Right neck sup. SCCis, hypertrophic EDC, 03/30/21 LN2 for recurrence   Squamous cell carcinoma of skin 12/01/2019   Right neck inf.  SCCis, hypertrophic EDC, 03/30/21 LN2 fo recurrence   Squamous cell carcinoma of skin 03/02/2020   Left upper arm, proximal. SCCis   Squamous cell carcinoma of skin 03/02/2020   Left upper arm, distal. SCCis   Tremors of nervous system    Past Surgical History:  Procedure Laterality Date   BREAST BIOPSY     Family History  Problem Relation Age of Onset   Heart attack Mother    Other Father        unsure - she was only 84 years old when he died   Social History   Socioeconomic History   Marital status: Widowed    Spouse name: Not on file   Number of children: 1   Years of education: 11th grade   Highest education level: Not on file  Occupational History   Occupation: Retired  Tobacco Use   Smoking status: Never   Smokeless tobacco: Never  Vaping Use   Vaping Use: Never used  Substance and Sexual Activity   Alcohol use: No    Alcohol/week: 0.0  standard drinks   Drug use: No   Sexual activity: Not Currently  Other Topics Concern   Not on file  Social History Narrative   Lives with daughter and son-in-law.   Right-handed.   Drinks one soda per day.   Social Determinants of Health   Financial Resource Strain: Low Risk    Difficulty of Paying Living Expenses: Not hard at all  Food Insecurity: No Food Insecurity   Worried About Charity fundraiser in the Last Year: Never true   [place] in the Last Year: Never true  Transportation Needs: No Transportation Needs   Lack of Transportation (Medical): No   Lack of Transportation (Non-Medical): No  Physical Activity: Inactive   Days of Exercise per Week: 0 days   Minutes of Exercise per Session: 0 min  Stress: No Stress Concern  Present   Feeling of Stress : Not at all  Social Connections: Moderately Isolated   Frequency of Communication with Friends and Family: Never   Frequency of Social Gatherings with Friends and Family: More than three times a week   Attends Religious Services: 1 to 4 times per year   Active Member of [employer] or Organizations: No   Attends Archivist Meetings: Never   Marital Status: Widowed    Tobacco Counseling Counseling given: Not Answered   Clinical Intake:  Pre-visit preparation completed: Yes  Pain : No/denies pain Pain Score: 0-No pain     BMI - recorded: 21.87 Nutritional Status: BMI of 19-24  Normal Nutritional Risks: None Diabetes: No  How often do you need to have someone help you when you read instructions, pamphlets, or other written materials from your doctor or pharmacy?: 1 - Never  Diabetic?No  Interpreter Needed?: No  Information entered by :: Orrin Brigham LPN   Activities of Daily Living In your present state of health, do you have any difficulty performing the following activities: 04/18/2021 04/07/2021  Hearing? [place]? Y -  Difficulty concentrating or making decisions? Y -  Comment has  demntia, managed by NP -  Walking or climbing stairs? Y -  Dressing or bathing? N -  Doing errands, shopping? Y N  Comment daughter does shopping and takes to doctors appointments -  Preparing Food and eating ? Y -  Comment daughter assists -  Using the Toilet? N -  In the past six months, have you accidently leaked urine? N -  Do you have problems with loss of bowel control? N -  Managing your Medications? Y -  Comment daughter assists -  Managing your Finances? Y -  Comment daughter assists -  Housekeeping or managing your Housekeeping? Y -  Comment daughter assists -  Some recent data might be hidden    Patient Care Team: Pleas Koch, NP as PCP - General (Internal Medicine) Debbora Dus, [facility] as Pharmacist (Pharmacist)  Indicate any recent Medical Services you may have received from other than [facility] providers in the past year (date may be approximate).     Assessment:   This is a routine wellness examination for [place].  Hearing/Vision screen Hearing Screening - Comments:: Issues with hearing , wears hearing aid  Vision Screening - Comments:: Last eye exam 2021, next appointment scheduled 11/22 @ [facility]  Dietary issues and exercise activities discussed: Current Exercise Habits: The patient does not participate in regular exercise at present, Exercise limited by: cardiac condition(s)   Goals Addressed             This Visit's Progress    Patient Stated       Maintain current health, drinking more water to avoid UTI.       Depression Screen PHQ 2/9 Scores 04/18/2021 04/16/2020 04/15/2019 04/10/2018 04/10/2018  PHQ - 2 Score 0 0 0 4 0  PHQ- 9 Score - 0 0 10 0    Fall Risk Fall Risk  04/18/2021 04/16/2020 04/15/2019 04/10/2018 10/17/2017  Falls in the past year? 0 1 0 No No  Comment - fell when getting out of chair - - -  Number falls in past yr: 0 0 0 - -  Injury with Fall? 0 0 0 - -  Risk for fall due to : Impaired balance/gait  Medication side effect;Impaired balance/gait;Impaired mobility Medication side effect;Impaired balance/gait - -  Risk for fall due to:  Comment uses walker - - - -  Follow up Falls prevention discussed Falls evaluation completed;Falls prevention discussed Falls evaluation completed;Falls prevention discussed - -    FALL RISK PREVENTION PERTAINING TO THE HOME:  Any stairs in or around the home? Yes  If so, are there any without handrails? Yes  Home free of loose throw rugs in walkways, pet beds, electrical cords, etc? Yes  Adequate lighting in your home to reduce risk of falls? Yes   ASSISTIVE DEVICES UTILIZED TO PREVENT FALLS:  Life alert? No  Use of a cane, walker or w/c? Yes , walker Grab bars in the bathroom? No  Shower chair or bench in shower? Yes  Elevated toilet seat or a handicapped toilet? Yes   TIMED UP AND GO:  Was the test performed? No , visit completed over the phone.   Cognitive Function: Cognitive status assessed/managed  by PCP per daughter. Patient has current diagnosis of cognitive impairment of dementia .Patient is followed by PCP  for ongoing assessment. Patient is unable to complete screening 6CIT or MMSE.   MMSE - Mini Mental State Exam 04/16/2020 04/15/2019 04/10/2018  Not completed: Unable to complete Unable to complete (No Data)        Immunizations Immunization History  Administered Date(s) Administered   Fluad Quad(high Dose 65+) 04/16/2019, 04/20/2020   Influenza,inj,Quad PF,6+ Mos 04/10/2016, 03/29/2017, 04/10/2018   PFIZER(Purple Top)SARS-COV-2 Vaccination 07/09/2019, 07/30/2019, 03/18/2020   Pneumococcal Conjugate-13 03/19/2014   Pneumococcal Polysaccharide-23 03/20/2015   Tdap 06/19/2008, 04/16/2019    TDAP status: Up to date  Flu Vaccine status: Due, Education has been provided regarding the importance of this vaccine. Advised may receive this vaccine at local pharmacy or Health Dept. Aware to provide a copy of the vaccination record if  obtained from local pharmacy or Health Dept. Verbalized acceptance and understanding.  Pneumococcal vaccine status: Up to date  Covid-19 vaccine status: Information provided on how to obtain vaccines.   Qualifies for Shingles Vaccine? Yes   Zostavax completed No   Shingrix completed No  Screening Tests Health Maintenance  Topic Date Due   Zoster Vaccines- Shingrix (1 of 2) Never done   COVID-19 Vaccine (4 - Booster for Pfizer series) 05/13/2020   INFLUENZA VACCINE  01/17/2021   TETANUS/TDAP  04/15/2029   Pneumonia Vaccine 33+ Years old  Completed   DEXA SCAN  Completed   HPV VACCINES  Aged Out    Health Maintenance  Health Maintenance Due  Topic Date Due   Zoster Vaccines- Shingrix (1 of 2) Never done   COVID-19 Vaccine (4 - Booster for Pfizer series) 05/13/2020   INFLUENZA VACCINE  01/17/2021    Colorectal cancer screening: No longer required.   Mammogram status: No longer required due to age.  Bone density status: No longer required  Lung Cancer Screening: (Low Dose CT Chest recommended if Age 82-80 years, 30 pack-year currently smoking OR have quit w/in 15years.) does not qualify.     Additional Screening:  Hepatitis C Screening: does not qualify;   Vision Screening: Recommended annual ophthalmology exams for early detection of glaucoma and other disorders of the eye. Is the patient up to date with their annual eye exam?  No , patient has an appointment schedule next month 11/22 Who is the provider or what is the name of the office in which the patient attends annual eye exams? [facility]   Dental Screening: Recommended annual dental exams for proper oral hygiene  Community Resource Referral / Chronic Care Management:  CRR required this visit?  No   CCM required this visit?  No      Plan:     I have personally reviewed and noted the following in the patient's chart:   Medical and social history Use of alcohol, tobacco or illicit drugs   Current medications and supplements including opioid prescriptions.  Functional ability and status Nutritional status Physical activity Advanced directives List of other physicians Hospitalizations, surgeries, and ER visits in previous 12 months Vitals Screenings to include cognitive, depression, and falls Referrals and appointments  In addition, I have reviewed and discussed with patient certain preventive protocols, quality metrics, and best practice recommendations. A written personalized care plan for preventive services as well as general preventive health recommendations were provided to patient.  Due to this being a telephonic visit, the after visit summary with patients personalized plan was offered to patient via mail or my-chart. Patient would like to access on my-chart.    Loma Messing, LPN   [id number]   Nurse Health Advisor  Nurse Notes: None

## 2021-04-19 ENCOUNTER — Other Ambulatory Visit: Payer: Self-pay

## 2021-04-19 ENCOUNTER — Ambulatory Visit (INDEPENDENT_AMBULATORY_CARE_PROVIDER_SITE_OTHER): Payer: Medicare Other | Admitting: Dermatology

## 2021-04-19 ENCOUNTER — Encounter: Payer: Self-pay | Admitting: Dermatology

## 2021-04-19 ENCOUNTER — Ambulatory Visit: Payer: Medicare Other | Admitting: Primary Care

## 2021-04-19 DIAGNOSIS — L820 Inflamed seborrheic keratosis: Secondary | ICD-10-CM

## 2021-04-19 DIAGNOSIS — L578 Other skin changes due to chronic exposure to nonionizing radiation: Secondary | ICD-10-CM

## 2021-04-19 DIAGNOSIS — D492 Neoplasm of unspecified behavior of bone, soft tissue, and skin: Secondary | ICD-10-CM

## 2021-04-19 DIAGNOSIS — C44212 Basal cell carcinoma of skin of right ear and external auricular canal: Secondary | ICD-10-CM | POA: Diagnosis not present

## 2021-04-19 NOTE — Progress Notes (Signed)
   Follow-Up Visit   Subjective  Nicole Downs is a 87 y.o. female who presents for the following: R/O BCC (R inf ear on the lobe) and check spot (R arm, 2-3 weeks,itchy).  There are several other areas to be evaluated today.  Patient accompanied by daughter who contributes to history.  The following portions of the chart were reviewed this encounter and updated as appropriate:   Tobacco  Allergies  Meds  Problems  Med Hx  Surg Hx  Fam Hx     Review of Systems:  No other skin or systemic complaints except as noted in HPI or Assessment and Plan.  Objective  Well appearing patient in no apparent distress; mood and affect are within normal limits.  A focused examination was performed including neck, right ear, right arm. Relevant physical exam findings are noted in the Assessment and Plan.  R inf ear on the lobe 2.0 x 1.0cm crusted ulcer     R forearm Erythematous keratotic or waxy stuck-on papule or plaque.    Assessment & Plan  Neoplasm of skin -suspicious for basal cell carcinoma. R inf ear on the lobe  Skin excision  Lesion length (cm):  2 Lesion width (cm):  1 Margin per side (cm):  0.2 Total excision diameter (cm):  2.4 Informed consent: discussed and consent obtained   Timeout: patient name, date of birth, surgical site, and procedure verified   Procedure prep:  Patient was prepped and draped in usual sterile fashion Prep type:  Isopropyl alcohol and povidone-iodine Anesthesia: the lesion was anesthetized in a standard fashion   Local anesthetic: 3cc lido w/epi, 1cc bupivicaine, Total = 4cc. Instrument used: #15 blade   Hemostasis achieved with: pressure and aluminum chloride   Hemostasis achieved with comment:  Electrocautery Outcome: patient tolerated procedure well with no complications   Post-procedure details: sterile dressing applied and wound care instructions given   Dressing type: bandage, bacitracin and pressure dressing (Mupirocin)     Specimen 1 - Surgical pathology Differential Diagnosis: D48.5 R/O BCc  Check Margins: No 2.0 x 1.0cm crusted ulcer Simple excision  Inflamed seborrheic keratosis versus squamous cell carcinoma. R forearm Hyperkeratotic plaque Plan bx on f/u -shave removal and EDC  Actinic Damage - chronic, secondary to cumulative UV radiation exposure/sun exposure over time - diffuse scaly erythematous macules with underlying dyspigmentation - Recommend daily broad spectrum sunscreen SPF 30+ to sun-exposed areas, reapply every 2 hours as needed.  - Recommend staying in the shade or wearing long sleeves, sun glasses (UVA+UVB protection) and wide brim hats (4-inch brim around the entire circumference of the hat). - Call for new or changing lesions.  Return in about 1 week (around 04/26/2021) for f/u simple exc and bx sk on R arm.  I, Sonya Hupman, RMA, am acting as scribe for Sarina Ser, MD . Documentation: I have reviewed the above documentation for accuracy and completeness, and I agree with the above.  Sarina Ser, MD

## 2021-04-19 NOTE — Patient Instructions (Signed)
If you have any questions or concerns for your doctor, please call our main line at [phone] and press option 4 to reach your doctor's medical assistant. If no one answers, please leave a voicemail as directed and we will return your call as soon as possible. Messages left after 4 pm will be answered the following business day.   You may also send us a message via MyChart. We typically respond to MyChart messages within 1-2 business days.  For prescription refills, please ask your pharmacy to contact our office. Our fax number is [phone].  If you have an urgent issue when the clinic is closed that cannot wait until the next business day, you can page your doctor at the number below.    Please note that while we do our best to be available for urgent issues outside of office hours, we are not available 24/7.   If you have an urgent issue and are unable to reach us, you may choose to seek medical care at your doctor's office, retail clinic, urgent care center, or emergency room.  If you have a medical emergency, please immediately call 911 or go to the emergency department.  Pager Numbers  - Dr. Kowalski: [phone]  - Dr. Moye: [phone]  - Dr. Stewart: [phone]  In the event of inclement weather, please call our main line at [phone] for an update on the status of any delays or closures.  Dermatology Medication Tips: Please keep the boxes that topical medications come in in order to help keep track of the instructions about where and how to use these. Pharmacies typically print the medication instructions only on the boxes and not directly on the medication tubes.   If your medication is too expensive, please contact our office at [phone] option 4 or send us a message through MyChart.   We are unable to tell what your co-pay for medications will be in advance as this is different depending on your insurance coverage. However, we may be able to find a substitute  medication at lower cost or fill out paperwork to get insurance to cover a needed medication.   If a prior authorization is required to get your medication covered by your insurance company, please allow us 1-2 business days to complete this process.  Drug prices often vary depending on where the prescription is filled and some pharmacies may offer cheaper prices.  The website www.goodrx.com contains coupons for medications through different pharmacies. The prices here do not account for what the cost may be with help from insurance (it may be cheaper with your insurance), but the website can give you the price if you did not use any insurance.  - You can print the associated coupon and take it with your prescription to the pharmacy.  - You may also stop by our office during regular business hours and pick up a GoodRx coupon card.  - If you need your prescription sent electronically to a different pharmacy, notify our office through [facility] MyChart or by phone at [phone] option 4.   Wound Care Instructions  Cleanse wound gently with soap and water once a day then pat dry with clean gauze. Apply a thing coat of Petrolatum (petroleum jelly, "Vaseline") over the wound (unless you have an allergy to this). We recommend that you use a new, sterile tube of Vaseline. Do not pick or remove scabs. Do not remove the yellow or white "healing tissue" from the base of the wound.  Cover the   wound with fresh, clean, nonstick gauze and secure with paper tape. You may use Band-Aids in place of gauze and tape if the would is small enough, but would recommend trimming much of the tape off as there is often too much. Sometimes Band-Aids can irritate the skin.  You should call the office for your biopsy report after 1 week if you have not already been contacted.  If you experience any problems, such as abnormal amounts of bleeding, swelling, significant bruising, significant pain, or evidence of infection,  please call the office immediately.  FOR ADULT SURGERY PATIENTS: If you need something for pain relief you may take 1 extra strength Tylenol (acetaminophen) AND 2 Ibuprofen (200mg each) together every 4 hours as needed for pain. (do not take these if you are allergic to them or if you have a reason you should not take them.) Typically, you may only need pain medication for 1 to 3 days.    

## 2021-04-21 DIAGNOSIS — Z9183 Wandering in diseases classified elsewhere: Secondary | ICD-10-CM | POA: Diagnosis not present

## 2021-04-21 DIAGNOSIS — N3000 Acute cystitis without hematuria: Secondary | ICD-10-CM | POA: Diagnosis not present

## 2021-04-21 DIAGNOSIS — F02811 Dementia in other diseases classified elsewhere, unspecified severity, with agitation: Secondary | ICD-10-CM | POA: Diagnosis not present

## 2021-04-21 DIAGNOSIS — G319 Degenerative disease of nervous system, unspecified: Secondary | ICD-10-CM | POA: Diagnosis not present

## 2021-04-21 DIAGNOSIS — F02818 Dementia in other diseases classified elsewhere, unspecified severity, with other behavioral disturbance: Secondary | ICD-10-CM | POA: Diagnosis not present

## 2021-04-21 DIAGNOSIS — Z1612 Extended spectrum beta lactamase (ESBL) resistance: Secondary | ICD-10-CM | POA: Diagnosis not present

## 2021-04-26 DIAGNOSIS — Z9183 Wandering in diseases classified elsewhere: Secondary | ICD-10-CM | POA: Diagnosis not present

## 2021-04-26 DIAGNOSIS — G319 Degenerative disease of nervous system, unspecified: Secondary | ICD-10-CM | POA: Diagnosis not present

## 2021-04-26 DIAGNOSIS — F02818 Dementia in other diseases classified elsewhere, unspecified severity, with other behavioral disturbance: Secondary | ICD-10-CM | POA: Diagnosis not present

## 2021-04-26 DIAGNOSIS — F02811 Dementia in other diseases classified elsewhere, unspecified severity, with agitation: Secondary | ICD-10-CM | POA: Diagnosis not present

## 2021-04-26 DIAGNOSIS — N3000 Acute cystitis without hematuria: Secondary | ICD-10-CM | POA: Diagnosis not present

## 2021-04-26 DIAGNOSIS — Z1612 Extended spectrum beta lactamase (ESBL) resistance: Secondary | ICD-10-CM | POA: Diagnosis not present

## 2021-04-28 DIAGNOSIS — F02811 Dementia in other diseases classified elsewhere, unspecified severity, with agitation: Secondary | ICD-10-CM | POA: Diagnosis not present

## 2021-04-28 DIAGNOSIS — Z1612 Extended spectrum beta lactamase (ESBL) resistance: Secondary | ICD-10-CM | POA: Diagnosis not present

## 2021-04-28 DIAGNOSIS — F02818 Dementia in other diseases classified elsewhere, unspecified severity, with other behavioral disturbance: Secondary | ICD-10-CM | POA: Diagnosis not present

## 2021-04-28 DIAGNOSIS — N3000 Acute cystitis without hematuria: Secondary | ICD-10-CM | POA: Diagnosis not present

## 2021-04-28 DIAGNOSIS — Z9183 Wandering in diseases classified elsewhere: Secondary | ICD-10-CM | POA: Diagnosis not present

## 2021-04-28 DIAGNOSIS — G319 Degenerative disease of nervous system, unspecified: Secondary | ICD-10-CM | POA: Diagnosis not present

## 2021-04-29 ENCOUNTER — Other Ambulatory Visit: Payer: Self-pay

## 2021-04-29 ENCOUNTER — Ambulatory Visit (INDEPENDENT_AMBULATORY_CARE_PROVIDER_SITE_OTHER): Payer: Medicare Other | Admitting: Primary Care

## 2021-04-29 ENCOUNTER — Encounter: Payer: Self-pay | Admitting: Primary Care

## 2021-04-29 VITALS — BP 136/82 | HR 81 | Temp 98.0°F | Ht 60.0 in | Wt 113.0 lb

## 2021-04-29 DIAGNOSIS — R600 Localized edema: Secondary | ICD-10-CM | POA: Diagnosis not present

## 2021-04-29 DIAGNOSIS — H409 Unspecified glaucoma: Secondary | ICD-10-CM | POA: Diagnosis not present

## 2021-04-29 DIAGNOSIS — F331 Major depressive disorder, recurrent, moderate: Secondary | ICD-10-CM | POA: Diagnosis not present

## 2021-04-29 DIAGNOSIS — Z23 Encounter for immunization: Secondary | ICD-10-CM | POA: Diagnosis not present

## 2021-04-29 DIAGNOSIS — I10 Essential (primary) hypertension: Secondary | ICD-10-CM | POA: Diagnosis not present

## 2021-04-29 DIAGNOSIS — E039 Hypothyroidism, unspecified: Secondary | ICD-10-CM

## 2021-04-29 DIAGNOSIS — G250 Essential tremor: Secondary | ICD-10-CM

## 2021-04-29 DIAGNOSIS — F03918 Unspecified dementia, unspecified severity, with other behavioral disturbance: Secondary | ICD-10-CM | POA: Diagnosis not present

## 2021-04-29 LAB — TSH: TSH: 1.88 u[IU]/mL (ref 0.35–5.50)

## 2021-04-29 NOTE — Assessment & Plan Note (Signed)
Stable. No concerns today. Continue Zoloft 25 mg.

## 2021-04-29 NOTE — Progress Notes (Signed)
Subjective:    Patient ID: Nicole Downs, female    DOB: 02-15-1924, 87 y.o.   MRN: [id number]  HPI  Nicole Downs is a very pleasant 87 y.o. female with a history of cystitis, pulmonary emphysema, hypothyroidism, hypertension, tremor, dementia who presents today for follow up of chronic conditions. Her daughter is with her today.  1) Recurrent Cystitis: History of, with recent hospitalization and multiple ED visits. She is finishing up home PT, daughter hasn't seen much of a difference as she doesn't do her exercises if the PT isn't there.   Her daughter is applying Estrace cream vaginally twice weekly. Her daughter denies acute confusion, foul smelling urine. The patient wears a pad for intermittent leaking. She denies incontinence.   2) Hypothyroidism: Currently managed on levothyroxine 25 mcg. She is taking this daily in the morning, empty stomach, water only.   3) Dementia: Chronic, no signs of progression per daughter, no acute confusion since her last episode of cystitis in October 2022. She is eating three meals daily, drinks at least some water throughout the day. No falls.    She is managed on Zoloft 25 mg for anxiety. The patient denies anxiety and depression concerns. Daughter believes Zoloft helps some.   4) Tremor: Chronic for years, no improvement with multiple treatments previously. This year she was weaned from propranolol without increased symptoms. Daughter doesn't believe that the propranolol really helped.   Immunizations: -Tetanus: 2020 -Influenza: Due today  -Covid-19: 3 vaccines  -Pneumonia: Prevnar 13 in 2015, Pneumovax in 2016  Diet: [place].  Exercise: No regular exercise. Mostly sedentary, has been working with PT.   Eye exam: Completes annually    BP Readings from Last 3 Encounters:  04/29/21 136/82  04/15/21 (!) 148/88  04/07/21 (!) 159/73       Review of Systems  Constitutional:  Negative for fever.  Respiratory:  Negative for  shortness of breath.   Cardiovascular:  Negative for chest pain.  Genitourinary:  Negative for frequency.  Psychiatric/Behavioral:  The patient is not nervous/anxious.         Past Medical History:  Diagnosis Date   Actinic keratosis    Acute confusion    Arthritis    Basal cell carcinoma 11/01/2015   L lat ankle   Basal cell carcinoma 12/13/2015   L dorsum foot   Basal cell carcinoma 09/12/2017   nasal root, R nasal bridge, L nasal bridge, R mandible infra auricular   Basal cell carcinoma 10/24/2017   R lateral foot   Basal cell carcinoma 12/05/2017   L dorsum foot   Basal cell carcinoma 11/27/2018   R of midline mid forehead   Basal cell carcinoma 06/25/2019   nasal bridge   Basal cell carcinoma 12/01/2019   Junction of right mandible and earlobe. Nodular pattern.   Emphysema lung (HCC)    Glaucoma    Hypertension    Hyponatremia    Hypothyroidism    Memory loss    Poor peripheral circulation    Squamous cell carcinoma of skin 09/20/2015   R prox mandible/in situ   Squamous cell carcinoma of skin 02/01/2016   nose   Squamous cell carcinoma of skin 10/24/2017   R lateral knee/in situ, R lat ankle/in situ   Squamous cell carcinoma of skin 12/05/2017   L medial calf/in situ   Squamous cell carcinoma of skin 12/01/2019   Right neck sup. SCCis, hypertrophic EDC, 03/30/21 LN2 for recurrence   Squamous cell carcinoma of skin 12/01/2019  Right neck inf.  SCCis, hypertrophic EDC, 03/30/21 LN2 fo recurrence   Squamous cell carcinoma of skin 03/02/2020   Left upper arm, proximal. SCCis   Squamous cell carcinoma of skin 03/02/2020   Left upper arm, distal. SCCis   Tremors of nervous system     Social History   Socioeconomic History   Marital status: Widowed    Spouse name: Not on file   Number of children: 1   Years of education: 11th grade   Highest education level: Not on file  Occupational History   Occupation: Retired  Tobacco Use   Smoking status: Never    Smokeless tobacco: Never  Vaping Use   Vaping Use: Never used  Substance and Sexual Activity   Alcohol use: No    Alcohol/week: 0.0 standard drinks   Drug use: No   Sexual activity: Not Currently  Other Topics Concern   Not on file  Social History Narrative   Lives with daughter and son-in-law.   Right-handed.   Drinks one soda per day.   Social Determinants of Health   Financial Resource Strain: Low Risk    Difficulty of Paying Living Expenses: Not hard at all  Food Insecurity: No Food Insecurity   Worried About Charity fundraiser in the Last Year: Never true   [place] in the Last Year: Never true  Transportation Needs: No Transportation Needs   Lack of Transportation (Medical): No   Lack of Transportation (Non-Medical): No  Physical Activity: Inactive   Days of Exercise per Week: 0 days   Minutes of Exercise per Session: 0 min  Stress: No Stress Concern Present   Feeling of Stress : Not at all  Social Connections: Moderately Isolated   Frequency of Communication with Friends and Family: Never   Frequency of Social Gatherings with Friends and Family: More than three times a week   Attends Religious Services: 1 to 4 times per year   Active Member of [employer] or Organizations: No   Attends Archivist Meetings: Never   Marital Status: Widowed  Human resources officer Violence: Not At Risk   Fear of Current or Ex-Partner: No   Emotionally Abused: No   Physically Abused: No   Sexually Abused: No    Past Surgical History:  Procedure Laterality Date   BREAST BIOPSY      Family History  Problem Relation Age of Onset   Heart attack Mother    Other Father        unsure - she was only 44 years old when he died    Allergies  Allergen Reactions   Augmentin [Amoxicillin-Pot Clavulanate] Rash   Amlodipine Other (See Comments)    Ankle edema    Current Outpatient Medications on File Prior to Visit  Medication Sig Dispense Refill   Calcium Carbonate  (CALCIUM 600 PO) Take 600 mg by mouth 2 (two) times daily.     Carboxymethylcellulose Sodium (REFRESH TEARS OP) Place 1 drop into both eyes in the morning and at bedtime.     Cholecalciferol (VITAMIN D3) 10000 units TABS Take 1,000 mg by mouth daily.     estradiol (ESTRACE VAGINAL) 0.1 MG/GM vaginal cream Place 1 Applicatorful vaginally 3 (three) times a week. For UTI prevention. 42.5 g 2   GLUCOSAMINE-CHONDROITIN PO Take 1 capsule by mouth daily.     ibuprofen (ADVIL) 200 MG tablet Take 200 mg by mouth at bedtime.     levobunolol (BETAGAN) 0.5 % ophthalmic solution Place 1  drop into both eyes 2 (two) times daily.     levothyroxine (SYNTHROID) 25 MCG tablet Take 1 tablet by mouth every morning on an empty stomach with water only.  No food or other medications for 30 minutes. Office visit required for further refills. (Patient taking differently: Take 25 mcg by mouth daily before breakfast.) 90 tablet 0   losartan (COZAAR) 25 MG tablet TAKE 1 TABLET BY MOUTH EVERY DAY FOR BLOOD PRESSURE 90 tablet 3   melatonin 1 MG TABS tablet Take 1 mg by mouth at bedtime.     sertraline (ZOLOFT) 25 MG tablet TAKE 1 TABLET BY MOUTH AT BEDTIME FOR DEPRESSION. (Patient taking differently: Take 25 mg by mouth at bedtime.) 90 tablet 1   No current facility-administered medications on file prior to visit.    BP 136/82   Pulse 81   Temp 98 F (36.7 C) (Temporal)   Ht 5' (1.524 m)   Wt 113 lb (51.3 kg)   SpO2 97%   BMI 22.07 kg/m  Objective:   Physical Exam Eyes:     Extraocular Movements: Extraocular movements intact.  Cardiovascular:     Rate and Rhythm: Normal rate and regular rhythm.  Pulmonary:     Effort: Pulmonary effort is normal.     Breath sounds: Normal breath sounds.  Musculoskeletal:     Cervical back: Neck supple.  Skin:    General: Skin is warm.     Comments: Large open wound behind right ear, recent skin cancer removed by dermatology. No surrounding erythema or puss-like drainage.    Neurological:     Mental Status: She is alert and oriented to person, place, and time.  Psychiatric:        Mood and Affect: Mood normal.          Assessment & Plan:      This visit occurred during the SARS-CoV-2 public health emergency.  Safety protocols were in place, including screening questions prior to the visit, additional usage of staff PPE, and extensive cleaning of exam room while observing appropriate contact time as indicated for disinfecting solutions.

## 2021-04-29 NOTE — Assessment & Plan Note (Signed)
Stable in the office today, continue losartan 25 mg daily. CMP from October reviewed.

## 2021-04-29 NOTE — Assessment & Plan Note (Signed)
Appears stable, she does follow all commands today, answers most questions appropriately.   Continue Zoloft 25 mg. She lives with daughter and son-in-law.

## 2021-04-29 NOTE — Patient Instructions (Addendum)
Stop by the lab prior to leaving today. I will notify you of your results once received.   It was a pleasure to see you today!  

## 2021-04-29 NOTE — Assessment & Plan Note (Addendum)
Minimal on exam today. No pitting. Encouraged elevation and walking some during the day. Continue to monitor.

## 2021-04-29 NOTE — Assessment & Plan Note (Signed)
Chronic, stable. Doesn't appear that propranolol was making a difference. Continue off all treatment. Family declines further evaluation.

## 2021-04-29 NOTE — Assessment & Plan Note (Signed)
Follows with ophthalmology. Continue Betagan drops.

## 2021-04-29 NOTE — Assessment & Plan Note (Signed)
TSH from early October reviewed and above goal. Repeat TSH pending.  Continue levothyroxine 25 mcg.

## 2021-05-03 ENCOUNTER — Ambulatory Visit (INDEPENDENT_AMBULATORY_CARE_PROVIDER_SITE_OTHER): Payer: Medicare Other | Admitting: Dermatology

## 2021-05-03 ENCOUNTER — Other Ambulatory Visit: Payer: Self-pay

## 2021-05-03 DIAGNOSIS — Z4801 Encounter for change or removal of surgical wound dressing: Secondary | ICD-10-CM | POA: Diagnosis not present

## 2021-05-03 DIAGNOSIS — Z9183 Wandering in diseases classified elsewhere: Secondary | ICD-10-CM | POA: Diagnosis not present

## 2021-05-03 DIAGNOSIS — F02811 Dementia in other diseases classified elsewhere, unspecified severity, with agitation: Secondary | ICD-10-CM | POA: Diagnosis not present

## 2021-05-03 DIAGNOSIS — L820 Inflamed seborrheic keratosis: Secondary | ICD-10-CM | POA: Diagnosis not present

## 2021-05-03 DIAGNOSIS — Z4800 Encounter for change or removal of nonsurgical wound dressing: Secondary | ICD-10-CM

## 2021-05-03 DIAGNOSIS — N3000 Acute cystitis without hematuria: Secondary | ICD-10-CM | POA: Diagnosis not present

## 2021-05-03 DIAGNOSIS — F02818 Dementia in other diseases classified elsewhere, unspecified severity, with other behavioral disturbance: Secondary | ICD-10-CM | POA: Diagnosis not present

## 2021-05-03 DIAGNOSIS — Z1612 Extended spectrum beta lactamase (ESBL) resistance: Secondary | ICD-10-CM | POA: Diagnosis not present

## 2021-05-03 DIAGNOSIS — G319 Degenerative disease of nervous system, unspecified: Secondary | ICD-10-CM | POA: Diagnosis not present

## 2021-05-03 NOTE — Patient Instructions (Addendum)

## 2021-05-03 NOTE — Progress Notes (Signed)
   Follow-Up Visit   Subjective  Nicole Downs is a 87 y.o. female who presents for the following: Wound Check (2 weeks wound check at right inferior ear at lobe, biopsy proven BCC ) and Skin Problem (Check growth on the right arm, pt recently scratched the top of this area ).  The following portions of the chart were reviewed this encounter and updated as appropriate:   Tobacco  Allergies  Meds  Problems  Med Hx  Surg Hx  Fam Hx     Review of Systems:  No other skin or systemic complaints except as noted in HPI or Assessment and Plan.  Objective  Well appearing patient in no apparent distress; mood and affect are within normal limits.  A focused examination was performed including right inferior ear, right forearm. Relevant physical exam findings are noted in the Assessment and Plan.  Right Forearm - Anterior x 1 Erythematous keratotic or waxy stuck-on papule or plaque.   right inferior ear at lobe Wound healing well, no sign of infection    Assessment & Plan  Inflamed seborrheic keratosis Right Forearm - Anterior x 1  Reassured benign age-related growth.  Recommend observation.  Discussed cryotherapy if spot(s) become irritated or inflamed.   Prior to procedure, discussed risks of blister formation, small wound, skin dyspigmentation, or rare scar following cryotherapy. Recommend Vaseline ointment to treated areas while healing.   Destruction of lesion - Right Forearm - Anterior x 1 Complexity: simple   Destruction method: cryotherapy   Informed consent: discussed and consent obtained   Timeout:  patient name, date of birth, surgical site, and procedure verified Lesion destroyed using liquid nitrogen: Yes   Region frozen until ice ball extended beyond lesion: Yes   Outcome: patient tolerated procedure well with no complications   Post-procedure details: wound care instructions given    Dressing change or removal, surgical wound right inferior ear at lobe Biopsy  proven BCC  Cleansed skin with Puracyn, applied Mupirocin ointment and non stick dressing.   Return in about 3 months (around 08/03/2021) for Hx of BCC .  IMarye Round, CMA, am acting as scribe for Sarina Ser, MD .  Documentation: I have reviewed the above documentation for accuracy and completeness, and I agree with the above.  Sarina Ser, MD

## 2021-05-08 ENCOUNTER — Encounter: Payer: Self-pay | Admitting: Dermatology

## 2021-05-09 DIAGNOSIS — F03918 Unspecified dementia, unspecified severity, with other behavioral disturbance: Secondary | ICD-10-CM

## 2021-05-10 MED ORDER — BUSPIRONE HCL 5 MG PO TABS: 5.0000 mg | ORAL_TABLET | Freq: Two times a day (BID) | ORAL | 0 refills | Status: DC

## 2021-05-10 MED ORDER — MEMANTINE HCL 5 MG PO TABS
5.0000 mg | ORAL_TABLET | Freq: Two times a day (BID) | ORAL | 0 refills | Status: DC
Start: 2021-05-10 — End: 2021-06-09

## 2021-05-11 ENCOUNTER — Other Ambulatory Visit: Payer: Self-pay | Admitting: Primary Care

## 2021-05-11 DIAGNOSIS — F331 Major depressive disorder, recurrent, moderate: Secondary | ICD-10-CM

## 2021-05-18 DIAGNOSIS — H2513 Age-related nuclear cataract, bilateral: Secondary | ICD-10-CM | POA: Diagnosis not present

## 2021-05-23 ENCOUNTER — Other Ambulatory Visit: Payer: Self-pay | Admitting: Primary Care

## 2021-05-23 DIAGNOSIS — F03918 Unspecified dementia, unspecified severity, with other behavioral disturbance: Secondary | ICD-10-CM

## 2021-05-23 DIAGNOSIS — E039 Hypothyroidism, unspecified: Secondary | ICD-10-CM

## 2021-06-02 ENCOUNTER — Emergency Department (HOSPITAL_COMMUNITY)
Admission: EM | Admit: 2021-06-02 | Discharge: 2021-06-02 | Disposition: A | Discharge status: Home / Self Care | Payer: Medicare Other | Attending: Emergency Medicine | Admitting: Emergency Medicine

## 2021-06-02 ENCOUNTER — Ambulatory Visit (INDEPENDENT_AMBULATORY_CARE_PROVIDER_SITE_OTHER): Payer: Medicare Other | Admitting: Podiatry

## 2021-06-02 ENCOUNTER — Other Ambulatory Visit: Payer: Self-pay

## 2021-06-02 ENCOUNTER — Telehealth: Payer: Self-pay | Admitting: Primary Care

## 2021-06-02 ENCOUNTER — Encounter (HOSPITAL_COMMUNITY): Payer: Self-pay | Admitting: Emergency Medicine

## 2021-06-02 DIAGNOSIS — Z79899 Other long term (current) drug therapy: Secondary | ICD-10-CM | POA: Diagnosis not present

## 2021-06-02 DIAGNOSIS — I10 Essential (primary) hypertension: Secondary | ICD-10-CM | POA: Diagnosis not present

## 2021-06-02 DIAGNOSIS — E039 Hypothyroidism, unspecified: Secondary | ICD-10-CM | POA: Diagnosis not present

## 2021-06-02 DIAGNOSIS — N950 Postmenopausal bleeding: Secondary | ICD-10-CM

## 2021-06-02 DIAGNOSIS — M79674 Pain in right toe(s): Secondary | ICD-10-CM

## 2021-06-02 DIAGNOSIS — N390 Urinary tract infection, site not specified: Secondary | ICD-10-CM | POA: Diagnosis not present

## 2021-06-02 DIAGNOSIS — R0989 Other specified symptoms and signs involving the circulatory and respiratory systems: Secondary | ICD-10-CM | POA: Diagnosis not present

## 2021-06-02 DIAGNOSIS — Z85828 Personal history of other malignant neoplasm of skin: Secondary | ICD-10-CM | POA: Diagnosis not present

## 2021-06-02 DIAGNOSIS — R319 Hematuria, unspecified: Secondary | ICD-10-CM | POA: Insufficient documentation

## 2021-06-02 DIAGNOSIS — B351 Tinea unguium: Secondary | ICD-10-CM

## 2021-06-02 DIAGNOSIS — M79675 Pain in left toe(s): Secondary | ICD-10-CM | POA: Diagnosis not present

## 2021-06-02 DIAGNOSIS — Z20822 Contact with and (suspected) exposure to covid-19: Secondary | ICD-10-CM | POA: Insufficient documentation

## 2021-06-02 LAB — COMPREHENSIVE METABOLIC PANEL
ALT: 14 U/L (ref 0–44)
AST: 19 U/L (ref 15–41)
Albumin: 3.6 g/dL (ref 3.5–5.0)
Alkaline Phosphatase: 69 U/L (ref 38–126)
Anion gap: 5 (ref 5–15)
BUN: 28 mg/dL — ABNORMAL HIGH (ref 8–23)
CO2: 29 mmol/L (ref 22–32)
Calcium: 9.2 mg/dL (ref 8.9–10.3)
Chloride: 103 mmol/L (ref 98–111)
Creatinine, Ser: 1.01 mg/dL — ABNORMAL HIGH (ref 0.44–1.00)
GFR, Estimated: 51 mL/min — ABNORMAL LOW (ref >60)
Glucose, Bld: 94 mg/dL (ref 70–99)
Potassium: 4 mmol/L (ref 3.5–5.1)
Sodium: 137 mmol/L (ref 135–145)
Total Bilirubin: 0.7 mg/dL (ref 0.3–1.2)
Total Protein: 7.1 g/dL (ref 6.5–8.1)

## 2021-06-02 LAB — CBC WITH DIFFERENTIAL/PLATELET
Abs Immature Granulocytes: 0.04 10*3/uL (ref 0.00–0.07)
Basophils Absolute: 0 10*3/uL (ref 0.0–0.1)
Basophils Relative: 0 %
Eosinophils Absolute: 0.1 10*3/uL (ref 0.0–0.5)
Eosinophils Relative: 1 %
HCT: 37.9 % (ref 36.0–46.0)
Hemoglobin: 12.3 g/dL (ref 12.0–15.0)
Immature Granulocytes: 0 %
Lymphocytes Relative: 18 %
Lymphs Abs: 1.7 10*3/uL (ref 0.7–4.0)
MCH: 31.9 pg (ref 26.0–34.0)
MCHC: 32.5 g/dL (ref 30.0–36.0)
MCV: 98.4 fL (ref 80.0–100.0)
Monocytes Absolute: 0.7 10*3/uL (ref 0.1–1.0)
Monocytes Relative: 8 %
Neutro Abs: 6.8 10*3/uL (ref 1.7–7.7)
Neutrophils Relative %: 73 %
Platelets: 269 10*3/uL (ref 150–400)
RBC: 3.85 MIL/uL — ABNORMAL LOW (ref 3.87–5.11)
RDW: 13.4 % (ref 11.5–15.5)
WBC: 9.3 10*3/uL (ref 4.0–10.5)
nRBC: 0 % (ref 0.0–0.2)

## 2021-06-02 LAB — URINALYSIS, ROUTINE W REFLEX MICROSCOPIC
Bilirubin Urine: NEGATIVE
Glucose, UA: NEGATIVE mg/dL
Ketones, ur: NEGATIVE mg/dL
Leukocytes,Ua: NEGATIVE
Nitrite: POSITIVE — AB
Protein, ur: NEGATIVE mg/dL
Specific Gravity, Urine: 1.025 (ref 1.005–1.030)
pH: 6 (ref 5.0–8.0)

## 2021-06-02 LAB — RESP PANEL BY RT-PCR (FLU A&B, COVID) ARPGX2
Influenza A by PCR: NEGATIVE
Influenza B by PCR: NEGATIVE
SARS Coronavirus 2 by RT PCR: NEGATIVE

## 2021-06-02 LAB — LIPASE, BLOOD: Lipase: 38 U/L (ref 11–51)

## 2021-06-02 MED ORDER — SODIUM CHLORIDE 0.9 % IV SOLN
1.0000 g | Freq: Once | INTRAVENOUS | Status: AC
  Administered 2021-06-02: 1 g via INTRAVENOUS
  Filled 2021-06-02: qty 10

## 2021-06-02 MED ORDER — CEPHALEXIN 500 MG PO CAPS: 500.0000 mg | ORAL_CAPSULE | Freq: Three times a day (TID) | ORAL | 0 refills | Status: DC

## 2021-06-02 NOTE — Telephone Encounter (Signed)
Patient's daughter on the phone states her mom has blood in her urine Transferring call to access nurse for further triage

## 2021-06-02 NOTE — Discharge Instructions (Addendum)
It was our pleasure to provide your ER care today - we hope that you feel better.  Your lab tests show a possible urine infection - take antibiotic as prescribed.  Your blood count/hemoglobin looks good.   On your exam, there was a small amount of blood in vagina  (and there was no blood seen on your rectal/stool exam) - follow up with your primary care doctor in the next 1-2 weeks - discuss possible further evaluation then. Also follow up for your blood pressure, which is high today.   Return to ER if worse, new symptoms, fevers, new/severe pain, heavy bleeding, fainting, trouble breathing, or other concern.

## 2021-06-02 NOTE — Telephone Encounter (Signed)
[facility] - Client TELEPHONE ADVICE RECORD [facility] Patient Name: Nicole Downs Gender: Female DOB: Jul 06, 1923 Age: 87 Y 9 M 2 D Return Phone Number: [phone] (Primary) Address: City/ State/ Zip: [place] [address] Client [facility] - Client Client Site [facility] - Day Provider Alma Friendly - NP Contact Type Call Who Is Calling Patient / Member / Family / Caregiver Call Type Triage / Clinical Caller Name [name] Relationship To Patient Daughter Return Phone Number [phone] (Primary) Chief Complaint Urine, Blood In Reason for Call Symptomatic / Request for [place] states that she is transferring a patient's daughter over for triage. The patient is urinating blood. Translation No Nurse Assessment Nurse: Vallery Sa, RN, Cathy Date/Time (Eastern Time): 06/02/2021 1:22:28 PM Confirm and document reason for call. If symptomatic, describe symptoms. ---Caller states Nicole Downs developed blood in her urine this morning. No fever. No pain. She is not cold, clammy or pale. Does the patient have any new or worsening symptoms? ---Yes Will a triage be completed? ---Yes Related visit to physician within the last 2 weeks? ---No Does the PT have any chronic conditions? (i.e. diabetes, asthma, this includes High risk factors for pregnancy, etc.) ---Yes List chronic conditions. ---Dementia, high Blood Pressure, Thyroid problems, Frequent UTIs (last UTI was in Oct 2022) Is this a behavioral health or substance abuse call? ---No Guidelines Guideline Title Affirmed Question Affirmed Notes Nurse Date/Time (Eastern Time) Urine - Blood In Passing pure blood or large blood clots (i.e., size > a dime) (Exception: fleck or small strands) [place], RN, Cathy 06/02/2021 1:25:37 PM Disp. Time Eilene Ghazi Time) Disposition Final User PLEASE NOTE: All  timestamps contained within this report are represented as [place] Standard Time. CONFIDENTIALTY NOTICE: This fax transmission is intended only for the addressee. It contains information that is legally privileged, confidential or otherwise protected from use or disclosure. If you are not the intended recipient, you are strictly prohibited from reviewing, disclosing, copying using or disseminating any of this information or taking any action in reliance on or regarding this information. If you have received this fax in error, please notify us immediately by telephone so that we can arrange for its return to [place]. Phone: [phone], Toll-Free: [phone], Fax: [phone] Page: 2 of 2 Call Id: [id number] 06/02/2021 1:28:38 PM Go to ED Now Yes Vallery Sa, RN, Rosey Bath Disagree/Comply Comply Caller Understands Yes PreDisposition Go to ED Care Advice Given Per Guideline GO TO ED NOW: * You need to be seen in the Emergency Department. * Go to the ED at ___________ [place] now. Drive carefully. ANOTHER ADULT SHOULD DRIVE: * It is better and safer if another adult drives instead of you. BRING MEDICINES: * Bring a list of your current medicines when you go to the Emergency Department (ER). * Bring the pill bottles too. This will help the doctor (or NP/PA) to make certain you are taking the right medicines and the right dose. CARE ADVICE given per Urine, Blood In (Adult) guideline. Referrals [facility] - E

## 2021-06-02 NOTE — ED Provider Notes (Signed)
Emergency Medicine Provider Triage Evaluation Note  Nicole Downs , a 87 y.o. female  was evaluated in triage.  Pt complains of blood in urine or stool.  Patient has dementia, level 5 caveat applies.  Patient's caregiver states she noticed bright red blood on the toilet paper in the bathroom trash can earlier, unable to identify if is coming from movements or from the vagina.  The patient is not able to elaborate.  There is been no fevers at home, patient has no history of GI bleed or abnormal colonoscopies.  She is not on any blood thinners..  Review of Systems  Positive: above Negative: above  Physical Exam  BP (!) 164/94 (BP Location: Right Arm)    Pulse 63    Temp 98.1 F (36.7 C) (Oral)    Resp 16    Ht 4\' 11"  (1.499 m)    Wt 52.2 kg    SpO2 97%    BMI 23.23 kg/m  Gen:   Awake, no distress   Resp:  Normal effort  MSK:   Moves extremities without difficulty  Other:  Abdomen is soft and nontender.  Medical Decision Making  Medically screening exam initiated at 2:42 PM.  Appropriate orders placed.  [name] was informed that the remainder of the evaluation will be completed by another provider, this initial triage assessment does not replace that evaluation, and the importance of remaining in the ED until their evaluation is complete.  Will need room, abdominal labs and urine ordered   Sherrill Raring, PA-C 06/02/21 [place], Alvin Critchley, DO 06/02/21 1724

## 2021-06-02 NOTE — Progress Notes (Signed)
This patient returns to my office for at risk foot care.  This patient requires this care by a professional since this patient will be at risk due to having poor circulation.  This patient is unable to cut nails herself since the patient cannot reach her nails.These nails are painful walking and wearing shoes. She presents to the office with her daughter. This patient presents for at risk foot care today.  General Appearance  Alert, conversant and in no acute stress.  Vascular  Dorsalis pedis and posterior tibial  pulses are  Weakly palpable  bilaterally.  Capillary return is within normal limits  bilaterally. Cold feet  bilaterally.  Neurologic  Senn-Weinstein monofilament wire test within normal limits  bilaterally. Muscle power within normal limits bilaterally.  Nails Thick disfigured discolored nails with subungual debris  from hallux to fifth toes bilaterally. No evidence of bacterial infection or drainage bilaterally.  Orthopedic  No limitations of motion  feet .  No crepitus or effusions noted.  No bony pathology or digital deformities noted.  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  HAV with overlapping second toe right foot.  Skin  normotropic skin with no porokeratosis noted bilaterally.  No signs of infections or ulcers noted.     Onychomycosis  Pain in right toes  Pain in left toes  Consent was obtained for treatment procedures.   Mechanical debridement of nails 1-5  bilaterally performed with a nail nipper.  Filed with dremel without incident.    Return office visit     4  months                 Told patient to return for periodic foot care and evaluation due to potential at risk complications.   Boneta Lucks DPM

## 2021-06-02 NOTE — ED Provider Notes (Signed)
[facility] DEPT Provider Note   CSN: [id number] Arrival date & time: 06/02/21  1416     History Chief Complaint  Patient presents with   Hematuria    Nicole Downs is a 87 y.o. female.  Patient noted with blood on toilet paper after wiping today. Pt w hx dementia, limited historian - level 5 caveat. Family member states unsure if from urine or rectum. No hx gi bleeding. Remote hx diverticula. Does have hx recurrent utis. At baseline, pt does not voice specific complaints, pain, etc. No anticoagulant use. Normal appetite. No vomiting. No fainting.   The history is provided by the patient, a relative and medical records. The history is limited by the condition of the patient.      Past Medical History:  Diagnosis Date   Actinic keratosis    Acute confusion    Acute encephalopathy 04/06/2021   Arthritis    Basal cell carcinoma 11/01/2015   L lat ankle   Basal cell carcinoma 12/13/2015   L dorsum foot   Basal cell carcinoma 09/12/2017   nasal root, R nasal bridge, L nasal bridge, R mandible infra auricular   Basal cell carcinoma 10/24/2017   R lateral foot   Basal cell carcinoma 12/05/2017   L dorsum foot   Basal cell carcinoma 11/27/2018   R of midline mid forehead   Basal cell carcinoma 06/25/2019   nasal bridge   Basal cell carcinoma 12/01/2019   Junction of right mandible and earlobe. Nodular pattern.   Basal cell carcinoma 04/19/2021   right inferior ear at ear lobe, EDC   Emphysema lung ([place])    Glaucoma    Hypertension    Hyponatremia    Hypothyroidism    Memory loss    Poor peripheral circulation    Squamous cell carcinoma of skin 09/20/2015   R prox mandible/in situ   Squamous cell carcinoma of skin 02/01/2016   nose   Squamous cell carcinoma of skin 10/24/2017   R lateral knee/in situ, R lat ankle/in situ   Squamous cell carcinoma of skin 12/05/2017   L medial calf/in situ   Squamous cell carcinoma of skin  12/01/2019   Right neck sup. SCCis, hypertrophic EDC, 03/30/21 LN2 for recurrence   Squamous cell carcinoma of skin 12/01/2019   Right neck inf.  SCCis, hypertrophic EDC, 03/30/21 LN2 fo recurrence   Squamous cell carcinoma of skin 03/02/2020   Left upper arm, proximal. SCCis   Squamous cell carcinoma of skin 03/02/2020   Left upper arm, distal. SCCis   Tremors of nervous system     Patient Active Problem List   Diagnosis Date Noted   Acute cough 04/15/2021   UTI (urinary tract infection) 04/06/2021   Skin tear of forearm without complication, left, initial encounter 02/01/2021   Pain due to onychomycosis of toenails of both feet 10/20/2019   Glaucoma 04/16/2019   Overgrown toenails 04/16/2019   Anemia 04/16/2019   Recurrent UTI 06/07/2018   Diverticulitis 05/02/2018   Moderate episode of recurrent major depressive disorder ([place]) 04/10/2018   Pulmonary emphysema ([place]) 03/29/2017   Memory loss 08/09/2016   Essential tremor 04/10/2016   Vitamin D deficiency 10/08/2015   Lower extremity edema 10/08/2015   Essential hypertension 06/24/2015   Hypothyroidism 06/24/2015   Dementia ([place]) 06/24/2015   Poor peripheral circulation 06/24/2015    Past Surgical History:  Procedure Laterality Date   BREAST BIOPSY       OB History   No obstetric history  on file.     Family History  Problem Relation Age of Onset   Heart attack Mother    Other Father        unsure - she was only 69 years old when he died    Social History   Tobacco Use   Smoking status: Never   Smokeless tobacco: Never  Vaping Use   Vaping Use: Never used  Substance Use Topics   Alcohol use: No    Alcohol/week: 0.0 standard drinks   Drug use: No    Home Medications Prior to Admission medications   Medication Sig Start Date End Date Taking? Authorizing Provider  busPIRone (BUSPAR) 5 MG tablet Take 1 tablet (5 mg total) by mouth 2 (two) times daily. As needed for anxiety. 05/10/21   Pleas Koch,  NP  Calcium Carbonate (CALCIUM 600 PO) Take 600 mg by mouth 2 (two) times daily.    [provider]  Carboxymethylcellulose Sodium (REFRESH TEARS OP) Place 1 drop into both eyes in the morning and at bedtime.    [provider]  Cholecalciferol (VITAMIN D3) 10000 units TABS Take 1,000 mg by mouth daily.    [provider]  estradiol (ESTRACE VAGINAL) 0.1 MG/GM vaginal cream Place 1 Applicatorful vaginally 3 (three) times a week. For UTI prevention. 04/15/21   Pleas Koch, NP  GLUCOSAMINE-CHONDROITIN PO Take 1 capsule by mouth daily.    [provider]  levobunolol (BETAGAN) 0.5 % ophthalmic solution Place 1 drop into both eyes 2 (two) times daily.    [provider]  levothyroxine (SYNTHROID) 25 MCG tablet TAKE 1 TABLET BY MOUTH EVERY MORNING ON AN EMPTY STOMACH WITH WATER ONLY. NO FOOD OR OTHER MEDICATIONS FOR 30 MINUTES. OFFICE VISIT REQUIRED FOR FURTHER REFILLS. 05/24/21   Pleas Koch, NP  losartan (COZAAR) 25 MG tablet TAKE 1 TABLET BY MOUTH EVERY DAY FOR BLOOD PRESSURE 04/10/21   Pleas Koch, NP  melatonin 1 MG TABS tablet Take 1 mg by mouth at bedtime.    [provider]  memantine (NAMENDA) 5 MG tablet Take 1 tablet (5 mg total) by mouth 2 (two) times daily. For memory and behavior 05/10/21   Pleas Koch, NP  sertraline (ZOLOFT) 25 MG tablet TAKE 1 TABLET BY MOUTH AT BEDTIME FOR DEPRESSION 05/11/21   Pleas Koch, NP    Allergies    Augmentin [amoxicillin-pot clavulanate] and Amlodipine  Review of Systems   Review of Systems  Unable to perform ROS: Dementia  Level 5 caveat, dementia, not compliant w ros   Physical Exam Updated Vital Signs BP (!) 164/94 (BP Location: Right Arm)    Pulse 63    Temp 98.1 F (36.7 C) (Oral)    Resp 16    Ht 1.499 m (4\' 11" )    Wt 52.2 kg    SpO2 97%    BMI 23.23 kg/m   Physical Exam Vitals and nursing note reviewed.  Constitutional:      Appearance: Normal  appearance. She is well-developed.  HENT:     Head: Atraumatic.     Nose: Nose normal.     Mouth/Throat:     Mouth: Mucous membranes are moist.  Eyes:     General: No scleral icterus.    Conjunctiva/sclera: Conjunctivae normal.  Neck:     Trachea: No tracheal deviation.  Cardiovascular:     Rate and Rhythm: Normal rate and regular rhythm.     Pulses: Normal pulses.  Heart sounds: Normal heart sounds. No murmur heard.   No friction rub. No gallop.  Pulmonary:     Effort: Pulmonary effort is normal. No respiratory distress.     Breath sounds: Normal breath sounds.  Abdominal:     General: Bowel sounds are normal. There is no distension.     Palpations: Abdomen is soft. There is no mass.     Tenderness: There is no abdominal tenderness. There is no guarding.  Genitourinary:    Comments: No cva tenderness. Chaperoned exam w RN - no rectal bleeding, stool is light brown, heme negative. Scant amount of dark blood on vaginal exam and in peri-urethral area, no active bleeding noted.  Musculoskeletal:        General: No swelling.     Cervical back: Normal range of motion and neck supple. No rigidity. No muscular tenderness.  Skin:    General: Skin is warm and dry.     Findings: No rash.  Neurological:     Mental Status: She is alert.     Comments: Alert, speech normal.   Psychiatric:        Mood and Affect: Mood normal.    ED Results / Procedures / Treatments   Labs (all labs ordered are listed, but only abnormal results are displayed) Results for orders placed or performed during the hospital encounter of 06/02/21  Resp Panel by RT-PCR (Flu A&B, Covid) Urine, Clean Catch   Specimen: Urine, Clean Catch; Nasopharyngeal(NP) swabs in vial transport medium  Result Value Ref Range   SARS Coronavirus 2 by RT PCR NEGATIVE NEGATIVE   Influenza A by PCR NEGATIVE NEGATIVE   Influenza B by PCR NEGATIVE NEGATIVE  CBC with Differential  Result Value Ref Range   WBC 9.3 4.0 - 10.5 K/uL    RBC 3.85 (L) 3.87 - 5.11 MIL/uL   Hemoglobin 12.3 12.0 - 15.0 g/dL   HCT 37.9 36.0 - 46.0 %   MCV 98.4 80.0 - 100.0 fL   MCH 31.9 26.0 - 34.0 pg   MCHC 32.5 30.0 - 36.0 g/dL   RDW 13.4 11.5 - 15.5 %   Platelets 269 150 - 400 K/uL   nRBC 0.0 0.0 - 0.2 %   Neutrophils Relative % 73 %   Neutro Abs 6.8 1.7 - 7.7 K/uL   Lymphocytes Relative 18 %   Lymphs Abs 1.7 0.7 - 4.0 K/uL   Monocytes Relative 8 %   Monocytes Absolute 0.7 0.1 - 1.0 K/uL   Eosinophils Relative 1 %   Eosinophils Absolute 0.1 0.0 - 0.5 K/uL   Basophils Relative 0 %   Basophils Absolute 0.0 0.0 - 0.1 K/uL   Immature Granulocytes 0 %   Abs Immature Granulocytes 0.04 0.00 - 0.07 K/uL  Comprehensive metabolic panel  Result Value Ref Range   Sodium 137 135 - 145 mmol/L   Potassium 4.0 3.5 - 5.1 mmol/L   Chloride 103 98 - 111 mmol/L   CO2 29 22 - 32 mmol/L   Glucose, Bld 94 70 - 99 mg/dL   BUN 28 (H) 8 - 23 mg/dL   Creatinine, Ser 1.01 (H) 0.44 - 1.00 mg/dL   Calcium 9.2 8.9 - 10.3 mg/dL   Total Protein 7.1 6.5 - 8.1 g/dL   Albumin 3.6 3.5 - 5.0 g/dL   AST 19 15 - 41 U/L   ALT 14 0 - 44 U/L   Alkaline Phosphatase 69 38 - 126 U/L   Total Bilirubin 0.7 0.3 - 1.2 mg/dL  GFR, Estimated 51 (L) >60 mL/min   Anion gap 5 5 - 15  Lipase, blood  Result Value Ref Range   Lipase 38 11 - 51 U/L  Urinalysis, Routine w reflex microscopic Urine, Clean Catch  Result Value Ref Range   Color, Urine YELLOW YELLOW   APPearance HAZY (A) CLEAR   Specific Gravity, Urine 1.025 1.005 - 1.030   pH 6.0 5.0 - 8.0   Glucose, UA NEGATIVE NEGATIVE mg/dL   Hgb urine dipstick TRACE (A) NEGATIVE   Bilirubin Urine NEGATIVE NEGATIVE   Ketones, ur NEGATIVE NEGATIVE mg/dL   Protein, ur NEGATIVE NEGATIVE mg/dL   Nitrite POSITIVE (A) NEGATIVE   Leukocytes,Ua NEGATIVE NEGATIVE   RBC / HPF 0-5 0 - 5 RBC/hpf   WBC, UA 11-20 0 - 5 WBC/hpf   Bacteria, UA MANY (A) NONE SEEN   Squamous Epithelial / LPF 0-5 0 - 5   Hyaline Casts, UA PRESENT       EKG None  Radiology No results found.  Procedures Procedures   Medications Ordered in ED Medications - No data to display  ED Course  I have reviewed the triage vital signs and the nursing notes.  Pertinent labs & imaging results that were available during my care of the patient were reviewed by me and considered in my medical decision making (see chart for details).    MDM Rules/Calculators/A&P                         Iv ns. Stat labs. Pt changed into gown/room.   Reviewed nursing notes and prior charts for additional history.   Labs reviewed/interpreted by me - wbc normal, hgb normal.  Ua pending.   On exam, no rectal bleeding or mass. Blood appears to have come from urethra/bladder or potentially vaginally - there is no active bleeding.  Await UA.   UA reviewed/interpreted by me - possible uti, rocephin iv, cx sent.   Given scant vaginal blood on exam, and no other definite source (stool neg and urine not grossly bloody), rec pcp f/u re abn vaginal bleeding.   Po fluids/food.   Abd soft nt, no distress. Normal vitals.   Pt currently appears stable for d/c.       Final Clinical Impression(s) / ED Diagnoses Final diagnoses:  None    Rx / DC Orders ED Discharge Orders     None        Lajean Saver, MD 06/02/21 1840

## 2021-06-02 NOTE — Telephone Encounter (Signed)
Noted, agree with ED evaluation

## 2021-06-02 NOTE — Telephone Encounter (Signed)
I spoke with [name] (DPR signed); [name] is presently enroute taking pt to [facility] ED. [name] said she saw several bloody tissues in bathroom wastebasket this morning and [name] asked pt where she was bleeding and pt said when she urinates "is pure blood". Pt still having very bloody urine. [name] said bloody urine started during the night last night or early morning today. [name] does not know if pt is having a fever or not; pt said she is not hurting in abd but [name] said she is not sure that is correct. [name] is having problems with GPS and I hung up so she could get instructions on how to get ot [facility]. Sending note to Gentry Fitz NP and [facility] CMA. Will teams Joellen also.

## 2021-06-02 NOTE — ED Triage Notes (Signed)
Patient's daughter reports she noticed blood in the toilet after patient used the bathroom. She is unsure if the blood was from urinating or a bowel movement. Also reports looser stools than normal. Denies any n/v/d. Hx HTN, hypothyroidism, tremor, dementia. Pt Aox3 at baseline.

## 2021-06-04 LAB — URINE CULTURE: Culture: >=100000 — AB

## 2021-06-05 ENCOUNTER — Telehealth: Payer: Self-pay | Admitting: Emergency Medicine

## 2021-06-05 NOTE — Progress Notes (Signed)
ED Antimicrobial Stewardship Positive Culture Follow Up   Nicole Downs is an 87 y.o. female who presented to [facility] on 06/02/2021 with a chief complaint of  Chief Complaint  Patient presents with   Hematuria    Recent Results (from the past 720 hour(s))  Urine Culture     Status: Abnormal   Collection Time: 06/02/21  5:08 PM   Specimen: Urine, Clean Catch  Result Value Ref Range Status   Specimen Description   Final    URINE, CLEAN CATCH Performed at [facility], [place] [address], [place], [place] [address]    Special Requests   Final    NONE Performed at [facility], [place] [address] [place], [place] [address]    Culture (A)  Final    >=100,000 COLONIES/mL ESCHERICHIA COLI Confirmed Extended Spectrum Beta-Lactamase Producer (ESBL).  In bloodstream infections from ESBL organisms, carbapenems are preferred over piperacillin/tazobactam. They are shown to have a lower risk of mortality.    Report Status 06/04/2021 FINAL  Final   Organism ID, Bacteria ESCHERICHIA COLI (A)  Final      Susceptibility   Escherichia coli - MIC*    AMPICILLIN >=32 RESISTANT Resistant     CEFAZOLIN >=64 RESISTANT Resistant     CEFEPIME 16 RESISTANT Resistant     CEFTRIAXONE >=64 RESISTANT Resistant     CIPROFLOXACIN >=4 RESISTANT Resistant     GENTAMICIN >=16 RESISTANT Resistant     IMIPENEM <=0.25 SENSITIVE Sensitive     NITROFURANTOIN <=16 SENSITIVE Sensitive     TRIMETH/SULFA >=320 RESISTANT Resistant     AMPICILLIN/SULBACTAM >=32 RESISTANT Resistant     PIP/TAZO <=4 SENSITIVE Sensitive     * >=100,000 COLONIES/mL ESCHERICHIA COLI  Resp Panel by RT-PCR (Flu A&B, Covid) Urine, Clean Catch     Status: None   Collection Time: 06/02/21  5:08 PM   Specimen: Urine, Clean Catch; Nasopharyngeal(NP) swabs in vial transport medium  Result Value Ref Range Status   SARS Coronavirus 2 by RT PCR NEGATIVE NEGATIVE Final    Comment: (NOTE) SARS-CoV-2 target  nucleic acids are NOT DETECTED.  The SARS-CoV-2 RNA is generally detectable in upper respiratory specimens during the acute phase of infection. The lowest concentration of SARS-CoV-2 viral copies this assay can detect is 138 copies/mL. A negative result does not preclude SARS-Cov-2 infection and should not be used as the sole basis for treatment or other patient management decisions. A negative result may occur with  improper specimen collection/handling, submission of specimen other than nasopharyngeal swab, presence of viral mutation(s) within the areas targeted by this assay, and inadequate number of viral copies(<138 copies/mL). A negative result must be combined with clinical observations, patient history, and epidemiological information. The expected result is Negative.  Fact Sheet for Patients:  EntrepreneurPulse.com.au  Fact Sheet for Healthcare Providers:  IncredibleEmployment.be  This test is no t yet approved or cleared by the [place] FDA and  has been authorized for detection and/or diagnosis of SARS-CoV-2 by FDA under an Emergency Use Authorization (EUA). This EUA will remain  in effect (meaning this test can be used) for the duration of the COVID-19 declaration under Section 564(b)(1) of the Act, 21 U.S.C.section 360bbb-3(b)(1), unless the authorization is terminated  or revoked sooner.       Influenza A by PCR NEGATIVE NEGATIVE Final   Influenza B by PCR NEGATIVE NEGATIVE Final    Comment: (NOTE) The Xpert Xpress SARS-CoV-2/FLU/RSV plus assay is intended as an aid in the  diagnosis of influenza from Nasopharyngeal swab specimens and should not be used as a sole basis for treatment. Nasal washings and aspirates are unacceptable for Xpert Xpress SARS-CoV-2/FLU/RSV testing.  Fact Sheet for Patients: EntrepreneurPulse.com.au  Fact Sheet for Healthcare  Providers: IncredibleEmployment.be  This test is not yet approved or cleared by the [place] FDA and has been authorized for detection and/or diagnosis of SARS-CoV-2 by FDA under an Emergency Use Authorization (EUA). This EUA will remain in effect (meaning this test can be used) for the duration of the COVID-19 declaration under Section 564(b)(1) of the Act, 21 U.S.C. section 360bbb-3(b)(1), unless the authorization is terminated or revoked.  Performed at [facility], [place] [address], [place], [place] [address]     [x]  Treated with cephalexin, organism resistant to prescribed antimicrobial  New antibiotic prescription: Fosfomycin 3 g PO x1 dose. No refills. Instruct patient to stop taking cephalexin  ED Provider: Margarita Mail, PA-C  Lenis Noon, PharmD 06/05/2021, 1:18 PM Clinical Pharmacist [phone]

## 2021-06-05 NOTE — Telephone Encounter (Signed)
Post ED Visit - Positive Culture Follow-up: Successful Patient Follow-Up  Culture assessed and recommendations reviewed by:  []  Elenor Quinones, Pharm.D. []  Heide Guile, Pharm.D., BCPS AQ-ID []  Parks Neptune, Pharm.D., BCPS []  Alycia Rossetti, Pharm.D., BCPS []  [place], [place].D., BCPS, AAHIVP []  Legrand Como, Pharm.D., BCPS, AAHIVP []  Salome Arnt, PharmD, BCPS []  Johnnette Gourd, PharmD, BCPS []  Hughes Better, PharmD, BCPS [x]  Samul Dada, PharmD  Positive urine culture  []  Patient discharged without antimicrobial prescription and treatment is now indicated [x]  Organism is resistant to prescribed ED discharge antimicrobial []  Patient with positive blood cultures  Changes discussed with ED provider: Margarita Mail PA New antibiotic prescription Fosfomycin three gram PO for one dose Called to [facility] [phone]  Contacted patient, date 06/05/2021, time [name] 06/05/2021, 3:00 PM

## 2021-06-06 DIAGNOSIS — N390 Urinary tract infection, site not specified: Secondary | ICD-10-CM

## 2021-06-08 ENCOUNTER — Other Ambulatory Visit: Payer: Self-pay | Admitting: Primary Care

## 2021-06-08 DIAGNOSIS — F03918 Unspecified dementia, unspecified severity, with other behavioral disturbance: Secondary | ICD-10-CM

## 2021-06-08 MED ORDER — FOSFOMYCIN TROMETHAMINE 3 G PO PACK: 3.0000 g | PACK | Freq: Once | ORAL | 0 refills | Status: AC

## 2021-06-28 ENCOUNTER — Telehealth: Payer: Self-pay

## 2021-06-28 NOTE — Chronic Care Management (AMB) (Addendum)
° ° °  Chronic Care Management Pharmacy Assistant   Name: Nicole Downs  MRN: [id number] DOB: Nov 20, 1923   Reason for Encounter: CCM Reminder Call   Conditions to be addressed/monitored: HTN   Medications: Outpatient Encounter Medications as of 06/28/2021  Medication Sig   busPIRone (BUSPAR) 5 MG tablet Take 1 tablet (5 mg total) by mouth 2 (two) times daily. As needed for anxiety.   Calcium Carbonate (CALCIUM 600 PO) Take 600 mg by mouth 2 (two) times daily.   Carboxymethylcellulose Sodium (REFRESH TEARS OP) Place 1 drop into both eyes in the morning and at bedtime.   cephALEXin (KEFLEX) 500 MG capsule Take 1 capsule (500 mg total) by mouth 3 (three) times daily.   Cholecalciferol (VITAMIN D3) 10000 units TABS Take 1,000 mg by mouth daily.   estradiol (ESTRACE VAGINAL) 0.1 MG/GM vaginal cream Place 1 Applicatorful vaginally 3 (three) times a week. For UTI prevention.   GLUCOSAMINE-CHONDROITIN PO Take 1 capsule by mouth daily.   levobunolol (BETAGAN) 0.5 % ophthalmic solution Place 1 drop into both eyes 2 (two) times daily.   levothyroxine (SYNTHROID) 25 MCG tablet TAKE 1 TABLET BY MOUTH EVERY MORNING ON AN EMPTY STOMACH WITH WATER ONLY. NO FOOD OR OTHER MEDICATIONS FOR 30 MINUTES. OFFICE VISIT REQUIRED FOR FURTHER REFILLS.   losartan (COZAAR) 25 MG tablet TAKE 1 TABLET BY MOUTH EVERY DAY FOR BLOOD PRESSURE   melatonin 1 MG TABS tablet Take 1 mg by mouth at bedtime.   memantine (NAMENDA) 5 MG tablet TAKE 1 TABLET (5 MG TOTAL) BY MOUTH 2 (TWO) TIMES DAILY. FOR MEMORY AND BEHAVIOR   sertraline (ZOLOFT) 25 MG tablet TAKE 1 TABLET BY MOUTH AT BEDTIME FOR DEPRESSION   No facility-administered encounter medications on file as of 06/28/2021.    [name] did not answer the telephone to remind of upcoming telephone visit with Debbora Dus on 07/05/21 at 8:30am. Patient was reminded to have all medications, supplements and any blood glucose and blood pressure readings available for  review at appointment. If unable to reach, a voicemail was left for patient.   Star Rating Drugs: Medication:  Last Fill: Day Supply Losartan 25mg  04/10/21 [place], CPP notified  Avel Sensor, [place] Assistant [phone]  Total time spent for month CPA: 10 min

## 2021-07-05 ENCOUNTER — Telehealth: Payer: Medicare Other

## 2021-07-05 ENCOUNTER — Telehealth: Payer: Self-pay

## 2021-07-05 NOTE — Telephone Encounter (Signed)
°  Chronic Care Management   Outreach Note  07/05/2021 Name: Nicole Downs MRN: [id number] DOB: [birth year]  Patient was scheduled for CCM televisit today 07/05/2021. Spoke to patient's daughter, [name]. She denies any medication concerns or questions. Denies cost concerns. Did not complete medication review at this time due to patient health status and goals. Patient family may contact CCM team at any time with concerns.  Debbora Dus, PharmD Clinical Pharmacist Practitioner [facility] [phone]

## 2021-07-05 NOTE — Progress Notes (Deleted)
Chronic Care Management Pharmacy Note  07/05/2021 Name:  Nicole Downs MRN:  [id number] DOB:  1923-09-15  Summary: ***  Recommendations/Changes made from today's visit: ***  Plan: ***   Subjective: Nicole Downs is an 88 y.o. year old female who is a primary patient of Pleas Koch, NP.  The CCM team was consulted for assistance with disease management and care coordination needs.    Engaged with patient by telephone for follow up visit in response to provider referral for pharmacy case management and/or care coordination services.   Consent to Services:  The patient was given information about Chronic Care Management services, agreed to services, and gave verbal consent prior to initiation of services.  Please see initial visit note for detailed documentation.   Patient Care Team: Pleas Koch, NP as PCP - General (Internal Medicine) Debbora Dus, [facility] as Pharmacist (Pharmacist)  Recent office visits: 04/29/2021 -   Recent consult visits: [facility] visits: 06/02/2021 - ED visit for acute UTI   Objective:  Lab Results  Component Value Date   CREATININE 1.01 (H) 06/02/2021   BUN 28 (H) 06/02/2021   GFR 35.83 (L) 04/21/2020   GFRNONAA 51 (L) 06/02/2021   GFRAA 33 (L) 05/04/2018   NA 137 06/02/2021   K 4.0 06/02/2021   CALCIUM 9.2 06/02/2021   CO2 29 06/02/2021   GLUCOSE 94 06/02/2021    Lab Results  Component Value Date/Time   GFR 35.83 (L) 04/21/2020 08:03 AM   GFR 41.08 (L) 04/16/2019 10:40 AM     Lab Results  Component Value Date   CHOL 167 04/21/2020   HDL 39.50 04/21/2020   LDLCALC 88 04/21/2020   TRIG 196.0 (H) 04/21/2020   CHOLHDL 4 04/21/2020    Hepatic Function Latest Ref Rng & Units 06/02/2021 04/15/2021 03/12/2021  Total Protein 6.5 - 8.1 g/dL 7.1 7.3 7.4  Albumin 3.5 - 5.0 g/dL 3.6 - 3.7  AST 15 - 41 U/L $Remo'19 18 20  'hNIJc$ ALT 0 - 44 U/L $Remo'14 9 11  'CsTgY$ Alk Phosphatase 38 - 126 U/L 69 - 55  Total Bilirubin 0.3 - 1.2 mg/dL  0.7 0.6 0.8    Lab Results  Component Value Date/Time   TSH 1.88 04/29/2021 10:19 AM   TSH 4.969 (H) 04/06/2021 01:15 AM   TSH 3.14 04/21/2020 08:03 AM    CBC Latest Ref Rng & Units 06/02/2021 04/15/2021 04/07/2021  WBC 4.0 - 10.5 K/uL 9.3 8.8 8.4  Hemoglobin 12.0 - 15.0 g/dL 12.3 13.0 10.6(L)  Hematocrit 36.0 - 46.0 % 37.9 38.6 32.2(L)  Platelets 150 - 400 K/uL 269 304 184    Lab Results  Component Value Date/Time   VD25OH 104.24 (HH) 04/10/2018 09:14 AM   VD25OH 82.23 10/08/2015 12:03 PM    Clinical ASCVD: No  The ASCVD Risk score (Arnett DK, et al., 2019) failed to calculate for the following reasons:   The 2019 ASCVD risk score is only valid for ages 90 to 29    Depression screen PHQ 2/9 04/18/2021 04/16/2020 04/15/2019  Decreased Interest 0 0 0  Down, Depressed, Hopeless 0 0 0  PHQ - 2 Score 0 0 0  Altered sleeping - 0 0  Tired, decreased energy - 0 0  Change in appetite - 0 0  Feeling bad or failure about yourself  - 0 0  Trouble concentrating - 0 0  Moving slowly or fidgety/restless - 0 0  Suicidal thoughts - 0 0  PHQ-9 Score - 0 0  Difficult doing work/chores - Not difficult at all Not difficult at all    Social History   Tobacco Use  Smoking Status Never  Smokeless Tobacco Never   BP Readings from Last 3 Encounters:  06/02/21 (!) 157/101  04/29/21 136/82  04/15/21 (!) 148/88   Pulse Readings from Last 3 Encounters:  06/02/21 93  04/29/21 81  04/15/21 75   Wt Readings from Last 3 Encounters:  06/02/21 115 lb (52.2 kg)  04/29/21 113 lb (51.3 kg)  04/18/21 112 lb (50.8 kg)   BMI Readings from Last 3 Encounters:  06/02/21 23.23 kg/m  04/29/21 22.07 kg/m  04/18/21 21.87 kg/m    Assessment/Interventions: Review of patient past medical history, allergies, medications, health status, including review of consultants reports, laboratory and other test data, was performed as part of comprehensive evaluation and provision of chronic care management  services.   SDOH:  (Social Determinants of Health) assessments and interventions performed: Yes  SDOH Screenings   Alcohol Screen: Low Risk    Last Alcohol Screening Score (AUDIT): 0  Depression (PHQ2-9): Low Risk    PHQ-2 Score: 0  Financial Resource Strain: Low Risk    Difficulty of Paying Living Expenses: Not hard at all  Food Insecurity: No Food Insecurity   Worried About Charity fundraiser in the Last Year: Never true   Ran Out of Food in the Last Year: Never true  Housing: Low Risk    Last Housing Risk Score: 0  Physical Activity: Inactive   Days of Exercise per Week: 0 days   Minutes of Exercise per Session: 0 min  Social Connections: Moderately Isolated   Frequency of Communication with Friends and Family: Never   Frequency of Social Gatherings with Friends and Family: More than three times a week   Attends Religious Services: 1 to 4 times per year   Active Member of [employer] or Organizations: No   Attends Archivist Meetings: Never   Marital Status: Widowed  Stress: No Stress Concern Present   Feeling of Stress : Not at all  Tobacco Use: Low Risk    Smoking Tobacco Use: Never   Smokeless Tobacco Use: Never   Passive Exposure: Not on file  Transportation Needs: No Transportation Needs   Lack of Transportation (Medical): No   Lack of Transportation (Non-Medical): No    CCM Care Plan  Allergies  Allergen Reactions   Augmentin [Amoxicillin-Pot Clavulanate] Rash   Amlodipine Other (See Comments)    Ankle edema    Medications Reviewed Today     Reviewed by Lajean Saver, MD (Physician) on 06/02/21 at 1515  Med List Status: <None>   Medication Order Taking? Sig Documenting Provider Last Dose Status Informant  busPIRone (BUSPAR) 5 MG tablet [id number]  Take 1 tablet (5 mg total) by mouth 2 (two) times daily. As needed for anxiety. Pleas Koch, NP  Active   Calcium Carbonate (CALCIUM 600 PO) [id number]  Take 600 mg by mouth 2 (two) times daily.  [provider]  Active Child  Carboxymethylcellulose Sodium (REFRESH TEARS OP) [id number]  Place 1 drop into both eyes in the morning and at bedtime. [provider]  Active Child  Cholecalciferol (VITAMIN D3) 10000 units TABS [id number]  Take 1,000 mg by mouth daily. [provider]  Active Child  estradiol (ESTRACE VAGINAL) 0.1 MG/GM vaginal cream [id number]  Place 1 Applicatorful vaginally 3 (three) times a week. For UTI prevention. Pleas Koch, NP  Active   GLUCOSAMINE-CHONDROITIN PO [id number]  Take 1 capsule by mouth daily. [provider]  Active Child  levobunolol (BETAGAN) 0.5 % ophthalmic solution [id number]  Place 1 drop into both eyes 2 (two) times daily. [provider]  Active Child  levothyroxine (SYNTHROID) 25 MCG tablet [id number]  TAKE 1 TABLET BY MOUTH EVERY MORNING ON AN EMPTY STOMACH WITH WATER ONLY. NO FOOD OR OTHER MEDICATIONS FOR 30 MINUTES. OFFICE VISIT REQUIRED FOR FURTHER REFILLS. Pleas Koch, NP  Active   losartan (COZAAR) 25 MG tablet [id number]  TAKE 1 TABLET BY MOUTH EVERY DAY FOR BLOOD PRESSURE Pleas Koch, NP  Active   melatonin 1 MG TABS tablet [id number]  Take 1 mg by mouth at bedtime. [provider]  Active Child  memantine (NAMENDA) 5 MG tablet [id number]  Take 1 tablet (5 mg total) by mouth 2 (two) times daily. For memory and behavior Pleas Koch, NP  Active   sertraline (ZOLOFT) 25 MG tablet [id number]  TAKE 1 TABLET BY MOUTH AT BEDTIME FOR DEPRESSION Pleas Koch, NP  Active             Patient Active Problem List   Diagnosis Date Noted   Acute cough 04/15/2021   UTI (urinary tract infection) 04/06/2021   Skin tear of forearm without complication, left, initial encounter 02/01/2021   Pain due to onychomycosis of toenails of both feet 10/20/2019   Glaucoma 04/16/2019   Overgrown toenails 04/16/2019   Anemia 04/16/2019   Recurrent UTI 06/07/2018   Diverticulitis 05/02/2018    Moderate episode of recurrent major depressive disorder ([place]) 04/10/2018   Pulmonary emphysema ([place]) 03/29/2017   Memory loss 08/09/2016   Essential tremor 04/10/2016   Vitamin D deficiency 10/08/2015   Lower extremity edema 10/08/2015   Essential hypertension 06/24/2015   Hypothyroidism 06/24/2015   Dementia ([place]) 06/24/2015   Poor peripheral circulation 06/24/2015    Immunization History  Administered Date(s) Administered   Fluad Quad(high Dose 65+) 04/16/2019, 04/20/2020, 04/29/2021   Influenza,inj,Quad PF,6+ Mos 04/10/2016, 03/29/2017, 04/10/2018   PFIZER(Purple Top)SARS-COV-2 Vaccination 07/09/2019, 07/30/2019, 03/18/2020   Pneumococcal Conjugate-13 03/19/2014   Pneumococcal Polysaccharide-23 03/20/2015   Tdap 06/19/2008, 04/16/2019   Zoster Recombinat (Shingrix) 05/06/2021    Conditions to be addressed/monitored:  {USCCMDZASSESSMENTOPTIONS:23563}  There are no care plans that you recently modified to display for this patient.   Current Barriers:  {pharmacybarriers:24917}  Pharmacist Clinical Goal(s):  Patient will {PHARMACYGOALCHOICES:24921} through collaboration with PharmD and provider.   Interventions: 1:1 collaboration with Pleas Koch, NP regarding development and update of comprehensive plan of care as evidenced by provider attestation and co-signature Inter-disciplinary care team collaboration (see longitudinal plan of care) Comprehensive medication review performed; medication list updated in electronic medical record  {CCM PHARMD DISEASE STATES:25130}  Patient Goals/Self-Care Activities Patient will:  - {pharmacypatientgoals:24919}  Follow Up Plan: {CM FOLLOW UP GPQD:82641}  Medication Assistance: None required.  Patient affirms current coverage meets needs.  Compliance/Adherence/Medication fill history: Care Gaps: None  Star-Rating Drugs: Medication:                Last Fill:         Day Supply Losartan $RemoveBeforeD'25mg'sCRFvuUlErCIIE$             04/10/21           89  Patient's preferred pharmacy is:  [id number] - [place], [place] - [address] [place] [address] Phone: [phone] Fax: [phone]  [facility] [address] [place] [address]  Phone: [phone] Fax: [phone]  Uses pill box? {Yes or If no, why not?:20788} Pt endorses ***% compliance  We discussed: {Pharmacy options:24294} Patient decided to: {US Pharmacy AFBX:03833}  Care Plan and Follow Up Patient Decision:  {FOLLOWUP:24991}  Plan: {CM FOLLOW UP XOVA:91916}  Debbora Dus, PharmD Clinical Pharmacist Practitioner [facility] [phone]  Phyically ok, mentally ok

## 2021-07-28 NOTE — Telephone Encounter (Signed)
Placed up front

## 2021-08-09 ENCOUNTER — Ambulatory Visit: Payer: Medicare Other | Admitting: Dermatology

## 2021-08-14 NOTE — Telephone Encounter (Signed)
Agree with evaluation in the office with someone if her bleeding returns.

## 2021-08-14 NOTE — Telephone Encounter (Signed)
I called and spoke to pt's daughter. Nicole Downs. Advised her we usually are not looking at [place] over the weekend. She said that the pt only had the one episode of bleeding yesterday morning and nothing else since. Said she was not having signs and symptoms of a UTI as she has in the past. I asked if she had taken the pt to be seen and she said no since the blood only happened once. I suggested that if she has any more episodes to call the office so we can get her in with someone. She did say that Anda Kraft had mentioned a referral to GYN if this happened again. She is aware Anda Kraft is out of the office for a few days. She will call the office if symptoms change. Will wait to hear back about a referral.

## 2021-08-23 ENCOUNTER — Other Ambulatory Visit: Payer: Self-pay | Admitting: Primary Care

## 2021-08-23 DIAGNOSIS — F03918 Unspecified dementia, unspecified severity, with other behavioral disturbance: Secondary | ICD-10-CM

## 2021-09-15 ENCOUNTER — Ambulatory Visit (INDEPENDENT_AMBULATORY_CARE_PROVIDER_SITE_OTHER): Payer: Medicare Other | Admitting: Dermatology

## 2021-09-15 DIAGNOSIS — D044 Carcinoma in situ of skin of scalp and neck: Secondary | ICD-10-CM

## 2021-09-15 DIAGNOSIS — D045 Carcinoma in situ of skin of trunk: Secondary | ICD-10-CM

## 2021-09-15 DIAGNOSIS — L570 Actinic keratosis: Secondary | ICD-10-CM

## 2021-09-15 DIAGNOSIS — L820 Inflamed seborrheic keratosis: Secondary | ICD-10-CM

## 2021-09-15 DIAGNOSIS — L578 Other skin changes due to chronic exposure to nonionizing radiation: Secondary | ICD-10-CM

## 2021-09-15 DIAGNOSIS — D099 Carcinoma in situ, unspecified: Secondary | ICD-10-CM

## 2021-09-15 DIAGNOSIS — C4442 Squamous cell carcinoma of skin of scalp and neck: Secondary | ICD-10-CM

## 2021-09-15 DIAGNOSIS — D492 Neoplasm of unspecified behavior of bone, soft tissue, and skin: Secondary | ICD-10-CM

## 2021-09-15 DIAGNOSIS — L821 Other seborrheic keratosis: Secondary | ICD-10-CM

## 2021-09-15 DIAGNOSIS — Z85828 Personal history of other malignant neoplasm of skin: Secondary | ICD-10-CM | POA: Diagnosis not present

## 2021-09-15 HISTORY — DX: Carcinoma in situ, unspecified: D09.9

## 2021-09-15 NOTE — Patient Instructions (Addendum)
Wound Care Instructions ? ?Cleanse wound gently with soap and water once a day then pat dry with clean gauze. Apply a thing coat of Petrolatum (petroleum jelly, "Vaseline") over the wound (unless you have an allergy to this). We recommend that you use a new, sterile tube of Vaseline. Do not pick or remove scabs. Do not remove the yellow or white "healing tissue" from the base of the wound. ? ?Cover the wound with fresh, clean, nonstick gauze and secure with paper tape. You may use Band-Aids in place of gauze and tape if the would is small enough, but would recommend trimming much of the tape off as there is often too much. Sometimes Band-Aids can irritate the skin. ? ?You should call the office for your biopsy report after 1 week if you have not already been contacted. ? ?If you experience any problems, such as abnormal amounts of bleeding, swelling, significant bruising, significant pain, or evidence of infection, please call the office immediately. ? ?FOR ADULT SURGERY PATIENTS: If you need something for pain relief you may take 1 extra strength Tylenol (acetaminophen) AND 2 Ibuprofen ('200mg'$  each) together every 4 hours as needed for pain. (do not take these if you are allergic to them or if you have a reason you should not take them.) Typically, you may only need pain medication for 1 to 3 days.  ? ? ? ? ? ? ?Cryotherapy Aftercare ? ?Wash gently with soap and water everyday.   ?Apply Vaseline and Band-Aid daily until healed.  ? ? ?If You Need Anything After Your Visit ? ?If you have any questions or concerns for your doctor, please call our main line at [phone] and press option 4 to reach your doctor's medical assistant. If no one answers, please leave a voicemail as directed and we will return your call as soon as possible. Messages left after 4 pm will be answered the following business day.  ? ?You may also send [place] a message via MyChart. We typically respond to MyChart messages within 1-2 business  days. ? ?For prescription refills, please ask your pharmacy to contact our office. Our fax number is [phone]. ? ?If you have an urgent issue when the clinic is closed that cannot wait until the next business day, you can page your doctor at the number below.   ? ?Please note that while we do our best to be available for urgent issues outside of office hours, we are not available 24/7.  ? ?If you have an urgent issue and are unable to reach [place], you may choose to seek medical care at your doctor's office, retail clinic, urgent care center, or emergency room. ? ?If you have a medical emergency, please immediately call 911 or go to the emergency department. ? ?Pager Numbers ? ?- Dr. Nehemiah Massed: [phone] ? ?- Dr. Laurence Ferrari: [phone] ? ?- Dr. Nicole Kindred: [phone] ? ?In the event of inclement weather, please call our main line at [phone] for an update on the status of any delays or closures. ? ?Dermatology Medication Tips: ?Please keep the boxes that topical medications come in in order to help keep track of the instructions about where and how to use these. Pharmacies typically print the medication instructions only on the boxes and not directly on the medication tubes.  ? ?If your medication is too expensive, please contact our office at [phone] option 4 or send [place] a message through [place].  ? ?We are unable to tell what your co-pay for medications will be  in advance as this is different depending on your insurance coverage. However, we may be able to find a substitute medication at lower cost or fill out paperwork to get insurance to cover a needed medication.  ? ?If a prior authorization is required to get your medication covered by your insurance company, please allow [place] 1-2 business days to complete this process. ? ?Drug prices often vary depending on where the prescription is filled and some pharmacies may offer cheaper prices. ? ?The website www.goodrx.com contains coupons for medications through  different pharmacies. The prices here do not account for what the cost may be with help from insurance (it may be cheaper with your insurance), but the website can give you the price if you did not use any insurance.  ?- You can print the associated coupon and take it with your prescription to the pharmacy.  ?- You may also stop by our office during regular business hours and pick up a GoodRx coupon card.  ?- If you need your prescription sent electronically to a different pharmacy, notify our office through [facility] or by phone at [phone] option 4. ? ? ? ? ?Si Usted Necesita Algo Despu?s de Su Visita ? ?Tambi?n puede enviarnos un mensaje a trav?s de MyChart. Por lo general respondemos a los mensajes de MyChart en el transcurso de 1 a 2 d?as h?biles. ? ?Para renovar recetas, por favor pida a su farmacia que se ponga en contacto con nuestra oficina. Nuestro n?mero de fax es el [phone]. ? ?Si tiene un asunto urgente cuando la cl?nica est? cerrada y que no puede esperar hasta el siguiente d?a h?bil, puede llamar/localizar a su doctor(a) al n?mero que aparece a continuaci?n.  ? ?Por favor, tenga en cuenta que aunque hacemos todo lo posible para estar disponibles para asuntos urgentes fuera del horario de oficina, no estamos disponibles las 24 horas del d?a, los 7 d?as de la semana.  ? ?Si tiene un problema urgente y no puede comunicarse con nosotros, puede optar por buscar atenci?n m?dica  en el consultorio de su doctor(a), en una cl?nica privada, en un centro de atenci?n urgente o en una sala de emergencias. ? ?Si tiene Engineer, maintenance (IT) m?dica, por favor llame inmediatamente al 911 o vaya a la sala de emergencias. ? ?N?meros de b?per ? ?- Dr. Nehemiah Massed: [phone] ? ?- Dra. Moye: [phone] ? ?- Dra. Nicole Kindred: [phone] ? ?En caso de inclemencias del tiempo, por favor llame a nuestra l?nea principal al [phone] para una actualizaci?n sobre el estado de cualquier retraso o cierre. ? ?Consejos  para la medicaci?n en dermatolog?a: ?Por favor, guarde las cajas en las que vienen los medicamentos de uso t?pico para ayudarle a seguir las instrucciones sobre d?nde y c?mo usarlos. Las farmacias generalmente imprimen las instrucciones del medicamento s?lo en las cajas y no directamente en los tubos del [place].  ? ?Si su medicamento es muy caro, por favor, p?ngase en contacto con Zigmund Daniel llamando al [phone] y presione la opci?n 4 o env?enos un mensaje a trav?s de MyChart.  ? ?No podemos decirle cu?l ser? su copago por los medicamentos por adelantado ya que esto es diferente dependiendo de la cobertura de su seguro. Sin embargo, es posible que podamos encontrar un medicamento sustituto a Electrical engineer un formulario para que el seguro cubra el medicamento que se considera necesario.  ? ?Si se requiere Ardelia Mems autorizaci?n previa para que su compa??a de seguros [place] su medicamento, por favor perm?tanos de 1 a  2 d?as h?biles para completar este proceso. ? ?Los precios de los medicamentos var?an con frecuencia dependiendo del Environmental consultant de d?nde se surte la receta y alguna farmacias pueden ofrecer precios m?s baratos. ? ?El sitio web www.goodrx.com tiene cupones para medicamentos de Airline pilot. Los precios aqu? no tienen en cuenta lo que podr?a costar con la ayuda del seguro (puede ser m?s barato con su seguro), pero el sitio web puede darle el precio si no utiliz? ning?n seguro.  ?- Puede imprimir el cup?n correspondiente y llevarlo con su receta a la farmacia.  ?- Tambi?n puede pasar por nuestra oficina durante el horario de atenci?n regular y recoger una tarjeta de cupones de GoodRx.  ?- Si necesita que su receta se env?e electr?nicamente a Chiropodist, informe a nuestra oficina a trav?s de MyChart de [place] o por tel?fono llamando al [phone] y presione la opci?n 4.  ?

## 2021-09-15 NOTE — Progress Notes (Signed)
? ?Follow-Up Visit ?  ?Subjective  ?Nicole Downs is a 88 y.o. female who presents for the following: check spots (Face, R clavicle, L lower leg, R lower leg) and Actinic Keratosis (Face, chest, f/u). ?The patient has spots, moles and lesions to be evaluated, some may be new or changing and the patient has concerns that these could be cancer. ? ?Patient accompanied by daughter who contributes to history. ? ?The patient has spots, moles and lesions to be evaluated, some may be new or changing and the patient has concerns that these could be cancer. ? ?The following portions of the chart were reviewed this encounter and updated as appropriate:  ? Tobacco  Allergies  Meds  Problems  Med Hx  Surg Hx  Fam Hx   ?  ?Review of Systems:  No other skin or systemic complaints except as noted in HPI or Assessment and Plan. ? ?Objective  ?Well appearing patient in no apparent distress; mood and affect are within normal limits. ? ?A focused examination was performed including face, neck, legs; chest; arms. Relevant physical exam findings are noted in the Assessment and Plan. ? ?Neck, lower face, x 16, R ankle x 3 (19) ?Stuck on waxy paps with erythema ? ?R medial clavicle ?1.2cm hyperkeratotic pap ? ?L lower leg x 1 ?Hyperkeratotic pap L lower leg ? ? ?Assessment & Plan  ? ?History of Basal Cell Carcinoma of the Skin ?- No evidence of recurrence today ?- Recommend regular full body skin exams ?- Recommend daily broad spectrum sunscreen SPF 30+ to sun-exposed areas, reapply every 2 hours as needed.  ?- Call if any new or changing lesions are noted between office visits  ?- multiple ? ?History of Squamous Cell Carcinoma of the Skin ?- No evidence of recurrence today ?- No lymphadenopathy ?- Recommend regular full body skin exams ?- Recommend daily broad spectrum sunscreen SPF 30+ to sun-exposed areas, reapply every 2 hours as needed.  ?- Call if any new or changing lesions are noted between office visits ?-  multiple ? ?Actinic Damage ?- chronic, secondary to cumulative UV radiation exposure/sun exposure over time ?- diffuse scaly erythematous macules with underlying dyspigmentation ?- Recommend daily broad spectrum sunscreen SPF 30+ to sun-exposed areas, reapply every 2 hours as needed.  ?- Recommend staying in the shade or wearing long sleeves, sun glasses (UVA+UVB protection) and wide brim hats (4-inch brim around the entire circumference of the hat). ?- Call for new or changing lesions.  ? ?Seborrheic Keratoses ?- Stuck-on, waxy, tan-brown papules and/or plaques  ?- Benign-appearing ?- Discussed benign etiology and prognosis. ?- Observe ?- Call for any changes ? ?Inflamed seborrheic keratosis (19) ?Neck, lower face, x 16, R ankle x 3 ?Destruction of lesion - Neck, lower face, x 16, R ankle x 3 ?Complexity: simple   ?Destruction method: cryotherapy   ?Informed consent: discussed and consent obtained   ?Timeout:  patient name, date of birth, surgical site, and procedure verified ?Lesion destroyed using liquid nitrogen: Yes   ?Region frozen until ice ball extended beyond lesion: Yes   ?Outcome: patient tolerated procedure well with no complications   ?Post-procedure details: wound care instructions given   ? ?Neoplasm of skin ?R medial clavicle - Neck ?Epidermal / dermal shaving ? ?Lesion diameter (cm):  1.2 ?Informed consent: discussed and consent obtained   ?Timeout: patient name, date of birth, surgical site, and procedure verified   ?Procedure prep:  Patient was prepped and draped in usual sterile fashion ?Prep type:  Isopropyl  alcohol ?Anesthesia: the lesion was anesthetized in a standard fashion   ?Anesthetic:  1% lidocaine w/ epinephrine 1-100,000 buffered w/ 8.4% NaHCO3 ?Instrument used: flexible razor blade   ?Hemostasis achieved with: pressure, aluminum chloride and electrodesiccation   ?Outcome: patient tolerated procedure well   ?Post-procedure details: sterile dressing applied and wound care instructions  given   ?Dressing type: bandage and bacitracin   ? ?Destruction of lesion ?Complexity: extensive   ?Destruction method: electrodesiccation and curettage   ?Informed consent: discussed and consent obtained   ?Timeout:  patient name, date of birth, surgical site, and procedure verified ?Procedure prep:  Patient was prepped and draped in usual sterile fashion ?Prep type:  Isopropyl alcohol ?Anesthesia: the lesion was anesthetized in a standard fashion   ?Anesthetic:  1% lidocaine w/ epinephrine 1-100,000 buffered w/ 8.4% NaHCO3 ?Curettage performed in three different directions: Yes   ?Electrodesiccation performed over the curetted area: Yes   ?Lesion length (cm):  1.2 ?Lesion width (cm):  1.2 ?Margin per side (cm):  0.2 ?Final wound size (cm):  1.6 ?Hemostasis achieved with:  pressure, aluminum chloride and electrodesiccation ?Outcome: patient tolerated procedure well with no complications   ?Post-procedure details: sterile dressing applied and wound care instructions given   ?Dressing type: bandage, bacitracin and pressure dressing   ? ?Specimen 1 - Surgical pathology ?Differential Diagnosis: D48.5 R/O SCC ?Check Margins: yes ?1.2cm hyperkeratotic pap ?EDC ? ?Hypertrophic actinic keratosis ?L lower leg x 1 ?Destruction of lesion - L lower leg x 1 ?Complexity: simple   ?Destruction method: cryotherapy   ?Informed consent: discussed and consent obtained   ?Timeout:  patient name, date of birth, surgical site, and procedure verified ?Lesion destroyed using liquid nitrogen: Yes   ?Region frozen until ice ball extended beyond lesion: Yes   ?Outcome: patient tolerated procedure well with no complications   ?Post-procedure details: wound care instructions given   ? ?Return in about 4 months (around 01/15/2022) for sun exposed areas, Hx of AKs, Hx of BCC, Hx of SCC. ? ?I, Othelia Pulling, RMA, am acting as scribe for Sarina Ser, MD . ?Documentation: I have reviewed the above documentation for accuracy and completeness, and I  agree with the above. ? ?Sarina Ser, MD ? ? ?

## 2021-09-16 ENCOUNTER — Other Ambulatory Visit: Payer: Self-pay | Admitting: Primary Care

## 2021-09-16 ENCOUNTER — Encounter: Payer: Self-pay | Admitting: Dermatology

## 2021-09-16 DIAGNOSIS — F03918 Unspecified dementia, unspecified severity, with other behavioral disturbance: Secondary | ICD-10-CM

## 2021-09-19 ENCOUNTER — Telehealth: Payer: Self-pay

## 2021-09-19 ENCOUNTER — Encounter: Payer: Self-pay | Admitting: Podiatry

## 2021-09-19 NOTE — Telephone Encounter (Signed)
-----   Message from Ralene Bathe, MD sent at 09/16/2021  6:25 PM EDT ----- ?Diagnosis ?Skin , right medial clavicle ?SQUAMOUS CELL CARCINOMA IN SITU, HYPERTROPHIC, BASE INVOLVED ? ?Cancer - SCCis ?Already treated ?Recheck next visit ?

## 2021-09-19 NOTE — Telephone Encounter (Signed)
Called pt daughter discussed biopsy results.  ?

## 2021-09-30 ENCOUNTER — Ambulatory Visit
Admission: EM | Admit: 2021-09-30 | Discharge: 2021-09-30 | Disposition: A | Discharge status: Home / Self Care | Payer: Medicare Other

## 2021-09-30 ENCOUNTER — Ambulatory Visit: Payer: Self-pay

## 2021-09-30 DIAGNOSIS — W19XXXA Unspecified fall, initial encounter: Secondary | ICD-10-CM | POA: Diagnosis not present

## 2021-09-30 DIAGNOSIS — S0101XA Laceration without foreign body of scalp, initial encounter: Secondary | ICD-10-CM

## 2021-09-30 NOTE — Discharge Instructions (Signed)
Wound is superficial and does not require any repair.  ?I do recommend applying Neosporin to 3 times per day to the wound.  Also for the next 2 days I would recommend applications of ice as I can feel some soft tissue swelling surrounding the wound likely resulting from the fall.  ?Continue to monitor site.  I would avoid washing her hair tomorrow as separation of the scalp  will ultimately cause the wound to reopen and bleed. ?

## 2021-09-30 NOTE — Telephone Encounter (Addendum)
I spoke with [name] (DPR signed) [name] is not sure what pt hit her head on. Guess on edge of nightstand or dresser. Cut 1/2" long on top of head; bleed when fell and blood ran down her face. Pt fell about 8 AM. Pt does not complain of any pain; pt had her regular morning. When washed pts hair this afternoon and cut began to bleed again and took few mins to stop bleeding.pt  BP 1 hr ago was BP 152/86 P75.No H/A, no dizziness and no CP or SOB per pt. [name] said not sure why pt fell and pt does not know. Pt has been having neck pain on and off for 1 month. Pt does not complain. [name] heard a loud noise this morning and [name] found pt on floor. [name] does not know for sure if pt lost consciousness or not. [name] said just few mins in between hearing the noise and cking on pt and pt was semi face down on floor but when [name] called her name pt responded. Laceration is not bleeding now. Pt is not on blood thinner.  Pt is acting normal and no speech issues. [name] wants to know if Anda Kraft thinks pt should be seen or what to do. [name] sent picture of cut on [place] cell. Sending note to Anda Kraft and [place] CMA. I spoke with Anda Kraft and she looked at picture [name] sent and Anda Kraft said to be safe take pt to UC and let them decide if any stapling or dermabond would be needed. [name] voiced understanding and scheduled appt at [facility] for 09/30/21 at 7:15 pm. Guidelines are pt to be seen within 6 - 8 hrs if laceration needs closure to help prevent infection. [name] said to leave appt and she is going to take pt to [facility] now and let UC cancel appt. Sending note to Anda Kraft and thank you.  [name] called back and she and pt were on their way to [facility] and got a call from [facility] to see if pt could come in now to be seen and [name] told them that she had talked with me and they were on their way to their UC in [place]. [name] asked if [facility] could cancel the 7:15 appt today at [facility] in [place] and  [name] was told they cannot cancel the appt. I advised [name] I had cancelled the appt while [name] was on the phone. Nothing further needed from [facility] at this time. ?

## 2021-09-30 NOTE — ED Provider Notes (Signed)
?[facility] ? ? ? ?CSN: [id number] ?Arrival date & time: 09/30/21  1501 ? ? ?  ? ?History   ?Chief Complaint ?Chief Complaint  ?Patient presents with  ? Laceration  ? Head Injury  ? ? ?HPI ?Nicole Downs is a 88 y.o. female.  ? ?HPI ?Patient suffers from dementia and found laying face down in her bedroom today.Patient sustained a laceration mid scalp.  Family member reports that she fell near her nightstand which has a sharp edge.  She has no other injuries or lacerations.  Patient has been in her normal state of health throughout the day.  Family member got concerned because after she washed her hair patient began to bleed from the site.  She was concerned that she may need sutures. ?Past Medical History:  ?Diagnosis Date  ? Actinic keratosis   ? Acute confusion   ? Acute encephalopathy 04/06/2021  ? Arthritis   ? Basal cell carcinoma 11/01/2015  ? L lat ankle  ? Basal cell carcinoma 12/13/2015  ? L dorsum foot  ? Basal cell carcinoma 09/12/2017  ? nasal root, R nasal bridge, L nasal bridge, R mandible infra auricular  ? Basal cell carcinoma 10/24/2017  ? R lateral foot  ? Basal cell carcinoma 12/05/2017  ? L dorsum foot  ? Basal cell carcinoma 11/27/2018  ? R of midline mid forehead  ? Basal cell carcinoma 06/25/2019  ? nasal bridge  ? Basal cell carcinoma 12/01/2019  ? Junction of right mandible and earlobe. Nodular pattern.  ? Basal cell carcinoma 04/19/2021  ? right inferior ear at ear lobe, EDC  ? Emphysema lung ([place])   ? Glaucoma   ? Hypertension   ? Hyponatremia   ? Hypothyroidism   ? Memory loss   ? Poor peripheral circulation   ? Squamous cell carcinoma in situ (SCCIS) 09/15/2021  ? right medial clavicle, EDC  ? Squamous cell carcinoma of skin 09/20/2015  ? R prox mandible/in situ  ? Squamous cell carcinoma of skin 02/01/2016  ? nose  ? Squamous cell carcinoma of skin 10/24/2017  ? R lateral knee/in situ, R lat ankle/in situ  ? Squamous cell carcinoma of skin 12/05/2017  ? L medial calf/in  situ  ? Squamous cell carcinoma of skin 12/01/2019  ? Right neck sup. SCCis, hypertrophic EDC, 03/30/21 LN2 for recurrence  ? Squamous cell carcinoma of skin 12/01/2019  ? Right neck inf.  SCCis, hypertrophic EDC, 03/30/21 LN2 fo recurrence  ? Squamous cell carcinoma of skin 03/02/2020  ? Left upper arm, proximal. SCCis  ? Squamous cell carcinoma of skin 03/02/2020  ? Left upper arm, distal. SCCis  ? Tremors of nervous system   ? ? ?Patient Active Problem List  ? Diagnosis Date Noted  ? Acute cough 04/15/2021  ? UTI (urinary tract infection) 04/06/2021  ? Skin tear of forearm without complication, left, initial encounter 02/01/2021  ? Pain due to onychomycosis of toenails of both feet 10/20/2019  ? Glaucoma 04/16/2019  ? Overgrown toenails 04/16/2019  ? Anemia 04/16/2019  ? Recurrent UTI 06/07/2018  ? Diverticulitis 05/02/2018  ? Moderate episode of recurrent major depressive disorder ([place]) 04/10/2018  ? Pulmonary emphysema ([place]) 03/29/2017  ? Memory loss 08/09/2016  ? Essential tremor 04/10/2016  ? Vitamin D deficiency 10/08/2015  ? Lower extremity edema 10/08/2015  ? Essential hypertension 06/24/2015  ? Hypothyroidism 06/24/2015  ? Dementia ([place]) 06/24/2015  ? Poor peripheral circulation 06/24/2015  ? ? ?Past Surgical History:  ?Procedure Laterality Date  ?  BREAST BIOPSY    ? ? ?OB History   ?No obstetric history on file. ?  ? ? ? ?Home Medications   ? ?Prior to Admission medications   ?Medication Sig Start Date End Date Taking? Authorizing Provider  ?busPIRone (BUSPAR) 5 MG tablet TAKE 1 TABLET (5 MG TOTAL) BY MOUTH 2 (TWO) TIMES DAILY. AS NEEDED FOR ANXIETY. 09/16/21   Pleas Koch, NP  ?Calcium Carbonate (CALCIUM 600 PO) Take 600 mg by mouth 2 (two) times daily.    [provider]  ?Carboxymethylcellulose Sodium (REFRESH TEARS OP) Place 1 drop into both eyes in the morning and at bedtime.    [provider]  ?cephALEXin (KEFLEX) 500 MG capsule Take 1 capsule (500 mg total) by mouth 3  (three) times daily. 06/03/21   Lajean Saver, MD  ?Cholecalciferol (VITAMIN D3) 10000 units TABS Take 1,000 mg by mouth daily.    [provider]  ?estradiol (ESTRACE VAGINAL) 0.1 MG/GM vaginal cream Place 1 Applicatorful vaginally 3 (three) times a week. For UTI prevention. 04/15/21   Pleas Koch, NP  ?GLUCOSAMINE-CHONDROITIN PO Take 1 capsule by mouth daily.    [provider]  ?levobunolol (BETAGAN) 0.5 % ophthalmic solution Place 1 drop into both eyes 2 (two) times daily.    [provider]  ?levothyroxine (SYNTHROID) 25 MCG tablet TAKE 1 TABLET BY MOUTH EVERY MORNING ON AN EMPTY STOMACH WITH WATER ONLY. NO FOOD OR OTHER MEDICATIONS FOR 30 MINUTES. OFFICE VISIT REQUIRED FOR FURTHER REFILLS. 05/24/21   Pleas Koch, NP  ?losartan (COZAAR) 25 MG tablet TAKE 1 TABLET BY MOUTH EVERY DAY FOR BLOOD PRESSURE 04/10/21   Pleas Koch, NP  ?melatonin 1 MG TABS tablet Take 1 mg by mouth at bedtime.    [provider]  ?memantine (NAMENDA) 5 MG tablet TAKE 1 TABLET (5 MG TOTAL) BY MOUTH 2 (TWO) TIMES DAILY. FOR MEMORY AND BEHAVIOR 06/09/21   Pleas Koch, NP  ?sertraline (ZOLOFT) 25 MG tablet TAKE 1 TABLET BY MOUTH AT BEDTIME FOR DEPRESSION 05/11/21   Pleas Koch, NP  ? ? ?Family History ?Family History  ?Problem Relation Age of Onset  ? Heart attack Mother   ? Other Father   ?     unsure - she was only 8 years old when he died  ? ? ?Social History ?Social History  ? ?Tobacco Use  ? Smoking status: Never  ? Smokeless tobacco: Never  ?Vaping Use  ? Vaping Use: Never used  ?Substance Use Topics  ? Alcohol use: No  ?  Alcohol/week: 0.0 standard drinks  ? Drug use: No  ? ? ? ?Allergies   ?Augmentin [amoxicillin-pot clavulanate] and Amlodipine ? ? ?Review of Systems ?Review of Systems ?Pertinent negatives listed in HPI  ?Physical Exam ?Triage Vital Signs ?ED Triage Vitals  ?Enc Vitals Group  ?   BP 09/30/21 1520 (S) (!) 171/98  ?   Pulse Rate 09/30/21 1520 90  ?    Resp 09/30/21 1530 18  ?   Temp 09/30/21 1520 (!) 97.4 ?F (36.3 ?C)  ?   Temp Source 09/30/21 1520 Oral  ?   SpO2 09/30/21 1520 (S) (!) 89 %  ?   Weight --   ?   Height --   ?   Head Circumference --   ?   Peak Flow --   ?   Pain Score 09/30/21 1525 0  ?   Pain Loc --   ?   Pain  Edu? --   ?   Excl. in [place]? --   ? ?No data found. ? ?Updated Vital Signs ?BP (S) (!) 171/98 (BP Location: Left Arm)   Pulse 95   Temp (!) 97.4 ?F (36.3 ?C) (Oral)   Resp 18   SpO2 (S) (!) 89% Comment: cold fingers. ? ?Visual Acuity ?Right Eye Distance:   ?Left Eye Distance:   ?Bilateral Distance:   ? ?Right Eye Near:   ?Left Eye Near:    ?Bilateral Near:    ? ?Physical Exam ?Constitutional:   ?   General: She is not in acute distress. ?   Appearance: She is not toxic-appearing.  ?HENT:  ?   Head: Normocephalic. Laceration present.  ? ?   Comments: Laceration approximately 3.5 cm, depth non measurable ?Soft tissue swelling surrounding the wound  ?Eyes:  ?   Extraocular Movements: Extraocular movements intact.  ?   Pupils: Pupils are equal, round, and reactive to light.  ?Cardiovascular:  ?   Rate and Rhythm: Normal rate and regular rhythm.  ?Pulmonary:  ?   Effort: Pulmonary effort is normal.  ?   Breath sounds: Normal breath sounds.  ?Musculoskeletal:  ?   Comments: At baseline   ?Skin: ?   General: Skin is warm and dry.  ?Neurological:  ?   Mental Status: She is alert. Mental status is at baseline.  ? ? ? ?UC Treatments / Results  ?Labs ?(all labs ordered are listed, but only abnormal results are displayed) ?Labs Reviewed - No data to display ? ?EKG ? ? ?Radiology ?No results found. ? ?Procedures ?Procedures (including critical care time) ? ?Medications Ordered in UC ?Medications - No data to display ? ?Initial Impression / Assessment and Plan / UC Course  ?I have reviewed the triage vital signs and the nursing notes. ? ?Pertinent labs & imaging results that were available during my care of the patient were reviewed by me and  considered in my medical decision making (see chart for details). ? ?  ?Scalp laceration, superficial wound, healing, closed,stable, requiring no intervention at present. Patient evaluated for any other injuries, foun

## 2021-09-30 NOTE — ED Triage Notes (Signed)
Patient presents to Urgent Care with complaints of a head laceration to scalp area at [phone]. Daughter states she believes she hit her head on the dresser or night stand. Daughter unsure if she hit head when she hit the floor. Not on any blood thinners.  ?

## 2021-10-06 ENCOUNTER — Ambulatory Visit: Payer: Medicare Other | Admitting: Podiatry

## 2021-10-27 ENCOUNTER — Encounter: Payer: Self-pay | Admitting: Primary Care

## 2021-10-27 ENCOUNTER — Ambulatory Visit (INDEPENDENT_AMBULATORY_CARE_PROVIDER_SITE_OTHER): Payer: Medicare Other | Admitting: Primary Care

## 2021-10-27 VITALS — BP 130/74 | Temp 98.6°F | Ht 59.0 in | Wt 113.0 lb

## 2021-10-27 DIAGNOSIS — I10 Essential (primary) hypertension: Secondary | ICD-10-CM | POA: Diagnosis not present

## 2021-10-27 DIAGNOSIS — R310 Gross hematuria: Secondary | ICD-10-CM | POA: Insufficient documentation

## 2021-10-27 DIAGNOSIS — Z66 Do not resuscitate: Secondary | ICD-10-CM

## 2021-10-27 DIAGNOSIS — F03918 Unspecified dementia, unspecified severity, with other behavioral disturbance: Secondary | ICD-10-CM

## 2021-10-27 LAB — POC URINALSYSI DIPSTICK (AUTOMATED)
Bilirubin, UA: NEGATIVE
Blood, UA: NEGATIVE
Glucose, UA: NEGATIVE
Ketones, UA: NEGATIVE
Nitrite, UA: POSITIVE
Protein, UA: POSITIVE — AB
Spec Grav, UA: 1.02 (ref 1.010–1.025)
Urobilinogen, UA: 1 E.U./dL
pH, UA: 5.5 (ref 5.0–8.0)

## 2021-10-27 MED ORDER — NITROFURANTOIN MONOHYD MACRO 100 MG PO CAPS
100.0000 mg | ORAL_CAPSULE | Freq: Two times a day (BID) | ORAL | 0 refills | Status: AC
Start: 2021-10-27 — End: 2021-11-01

## 2021-10-27 NOTE — Assessment & Plan Note (Signed)
Consented with patient and daughter regarding DNR status, they both request to proceed. ? ?DNR form completed today. ?

## 2021-10-27 NOTE — Patient Instructions (Signed)
Start Macrobid (nitrofurantoin) tablets for urinary tract infection. Take 1 tablet by mouth twice daily for five days. ? ?Stop checking blood pressure daily. Continue losartan 25 mg daily for blood pressure.  ? ?It was a pleasure to see you today! ? ? ?

## 2021-10-27 NOTE — Assessment & Plan Note (Signed)
Appears to be progressing. ?Overall the behavioral disturbance is mild and her family is satisfied with results from [facility]. ? ?Continue Namenda 5 mg twice daily. ?

## 2021-10-27 NOTE — Progress Notes (Signed)
? ?Subjective:  ? ? Patient ID: Nicole Downs, female    DOB: 1924-01-23, 88 y.o.   MRN: [id number] ? ?HPI ? ?Nicole Downs is a very pleasant 88 y.o. female with a history of hypertension, hypothyroidism, dementia, vaginal bleeding, essential tremor, recurrent UTI who presents today to discuss hematuria and hypertension.  Her daughter joins [place] today. She is also needing a DNR.  ? ?1) Hematuria: History of recurrent UTI with symptoms of hematuria.  Her daughter notified us last week that she noticed some bright red blood on the toilet paper after her mother finished urinating.  ? ?Today her daughter endorses no additional hematuria since last week. Her daughter has noticed a bad smell in the bathroom but the patient has been picking up stool out of the toilet and throwing it away in the trash.  ? ?2) Essential Hypertension: Currently managed on losartan 25 mg daily.  Her daughter monitors her blood pressure at home which has been ranging in the 130s to 160s over 80s to 90s, mostly in the 160s over 90s in April 2023. ? ?Over the last few weeks her BP at home has been running in the 120's-130's/70's-90's.  ? ?Today her daughter endorses compliance of losartan 25 mg daily. The patient has not reported chest pain, dizziness, headaches.  ? ?Both the patient and her daughter would like to complete a DNR as the patient does not want life saving measures if she were to go into cardiac arrest.  ? ? ?BP Readings from Last 3 Encounters:  ?10/27/21 130/74  ?09/30/21 (S) (!) 171/98  ?06/02/21 (!) 157/101  ? ? ? ? ?Review of Systems  ?Respiratory:  Negative for shortness of breath.   ?Cardiovascular:  Negative for chest pain.  ?Genitourinary:  Negative for hematuria.  ?Neurological:  Negative for dizziness and headaches.  ? ?   ? ? ?Past Medical History:  ?Diagnosis Date  ? Actinic keratosis   ? Acute confusion   ? Acute encephalopathy 04/06/2021  ? Arthritis   ? Basal cell carcinoma 11/01/2015  ? L lat ankle  ? Basal  cell carcinoma 12/13/2015  ? L dorsum foot  ? Basal cell carcinoma 09/12/2017  ? nasal root, R nasal bridge, L nasal bridge, R mandible infra auricular  ? Basal cell carcinoma 10/24/2017  ? R lateral foot  ? Basal cell carcinoma 12/05/2017  ? L dorsum foot  ? Basal cell carcinoma 11/27/2018  ? R of midline mid forehead  ? Basal cell carcinoma 06/25/2019  ? nasal bridge  ? Basal cell carcinoma 12/01/2019  ? Junction of right mandible and earlobe. Nodular pattern.  ? Basal cell carcinoma 04/19/2021  ? right inferior ear at ear lobe, EDC  ? Emphysema lung ([place])   ? Glaucoma   ? Hypertension   ? Hyponatremia   ? Hypothyroidism   ? Memory loss   ? Poor peripheral circulation   ? Squamous cell carcinoma in situ (SCCIS) 09/15/2021  ? right medial clavicle, EDC  ? Squamous cell carcinoma of skin 09/20/2015  ? R prox mandible/in situ  ? Squamous cell carcinoma of skin 02/01/2016  ? nose  ? Squamous cell carcinoma of skin 10/24/2017  ? R lateral knee/in situ, R lat ankle/in situ  ? Squamous cell carcinoma of skin 12/05/2017  ? L medial calf/in situ  ? Squamous cell carcinoma of skin 12/01/2019  ? Right neck sup. SCCis, hypertrophic EDC, 03/30/21 LN2 for recurrence  ? Squamous cell carcinoma of skin 12/01/2019  ? Right neck  inf.  SCCis, hypertrophic EDC, 03/30/21 LN2 fo recurrence  ? Squamous cell carcinoma of skin 03/02/2020  ? Left upper arm, proximal. SCCis  ? Squamous cell carcinoma of skin 03/02/2020  ? Left upper arm, distal. SCCis  ? Tremors of nervous system   ? ? ?Social History  ? ?Socioeconomic History  ? Marital status: Widowed  ?  Spouse name: Not on file  ? Number of children: 1  ? Years of education: 11th grade  ? Highest education level: Not on file  ?Occupational History  ? Occupation: Retired  ?Tobacco Use  ? Smoking status: Never  ? Smokeless tobacco: Never  ?Vaping Use  ? Vaping Use: Never used  ?Substance and Sexual Activity  ? Alcohol use: No  ?  Alcohol/week: 0.0 standard drinks  ? Drug use: No  ? Sexual  activity: Not Currently  ?Other Topics Concern  ? Not on file  ?Social History Narrative  ? Lives with daughter and son-in-law.  ? Right-handed.  ? Drinks one soda per day.  ? ?Social Determinants of Health  ? ?Financial Resource Strain: Low Risk   ? Difficulty of Paying Living Expenses: Not hard at all  ?Food Insecurity: No Food Insecurity  ? Worried About Charity fundraiser in the Last Year: Never true  ? Ran Out of Food in the Last Year: Never true  ?Transportation Needs: No Transportation Needs  ? Lack of Transportation (Medical): No  ? Lack of Transportation (Non-Medical): No  ?Physical Activity: Inactive  ? Days of Exercise per Week: 0 days  ? Minutes of Exercise per Session: 0 min  ?Stress: No Stress Concern Present  ? Feeling of Stress : Not at all  ?Social Connections: Moderately Isolated  ? Frequency of Communication with Friends and Family: Never  ? Frequency of Social Gatherings with Friends and Family: More than three times a week  ? Attends Religious Services: 1 to 4 times per year  ? Active Member of Clubs or Organizations: No  ? Attends Archivist Meetings: Never  ? Marital Status: Widowed  ?Intimate Partner Violence: Not At Risk  ? Fear of Current or Ex-Partner: No  ? Emotionally Abused: No  ? Physically Abused: No  ? Sexually Abused: No  ? ? ?Past Surgical History:  ?Procedure Laterality Date  ? BREAST BIOPSY    ? ? ?Family History  ?Problem Relation Age of Onset  ? Heart attack Mother   ? Other Father   ?     unsure - she was only 37 years old when he died  ? ? ?Allergies  ?Allergen Reactions  ? Augmentin [Amoxicillin-Pot Clavulanate] Rash  ? Amlodipine Other (See Comments)  ?  Ankle edema  ? ? ?Current Outpatient Medications on File Prior to Visit  ?Medication Sig Dispense Refill  ? busPIRone (BUSPAR) 5 MG tablet TAKE 1 TABLET (5 MG TOTAL) BY MOUTH 2 (TWO) TIMES DAILY. AS NEEDED FOR ANXIETY. 180 tablet 1  ? Calcium Carbonate (CALCIUM 600 PO) Take 600 mg by mouth 2 (two) times daily.     ? Carboxymethylcellulose Sodium (REFRESH TEARS OP) Place 1 drop into both eyes in the morning and at bedtime.    ? Cholecalciferol (VITAMIN D3) 10000 units TABS Take 1,000 mg by mouth daily.    ? GLUCOSAMINE-CHONDROITIN PO Take 1 capsule by mouth daily.    ? levobunolol (BETAGAN) 0.5 % ophthalmic solution Place 1 drop into both eyes 2 (two) times daily.    ? levothyroxine (SYNTHROID) 25 MCG  tablet TAKE 1 TABLET BY MOUTH EVERY MORNING ON AN EMPTY STOMACH WITH WATER ONLY. NO FOOD OR OTHER MEDICATIONS FOR 30 MINUTES. OFFICE VISIT REQUIRED FOR FURTHER REFILLS. 90 tablet 3  ? losartan (COZAAR) 25 MG tablet TAKE 1 TABLET BY MOUTH EVERY DAY FOR BLOOD PRESSURE 90 tablet 3  ? melatonin 1 MG TABS tablet Take 1 mg by mouth at bedtime.    ? memantine (NAMENDA) 5 MG tablet TAKE 1 TABLET (5 MG TOTAL) BY MOUTH 2 (TWO) TIMES DAILY. FOR MEMORY AND BEHAVIOR 180 tablet 1  ? sertraline (ZOLOFT) 25 MG tablet TAKE 1 TABLET BY MOUTH AT BEDTIME FOR DEPRESSION 90 tablet 3  ? ?No current facility-administered medications on file prior to visit.  ? ? ?BP 130/74   Temp 98.6 ?F (37 ?C) (Oral)   Ht '4\' 11"'$  (1.499 m)   Wt 113 lb (51.3 kg)   BMI 22.82 kg/m?  ?Objective:  ? Physical Exam ?Cardiovascular:  ?   Rate and Rhythm: Normal rate and regular rhythm.  ?Pulmonary:  ?   Effort: Pulmonary effort is normal.  ?   Breath sounds: Normal breath sounds.  ?Musculoskeletal:  ?   Cervical back: Neck supple.  ?Skin: ?   General: Skin is warm and dry.  ?Neurological:  ?   Mental Status: She is alert and oriented to person, place, and time.  ? ? ? ? ? ?   ?Assessment & Plan:  ? ? ? ? ?This visit occurred during the SARS-CoV-2 public health emergency.  Safety protocols were in place, including screening questions prior to the visit, additional usage of staff PPE, and extensive cleaning of exam room while observing appropriate contact time as indicated for disinfecting solutions.  ?

## 2021-10-27 NOTE — Assessment & Plan Note (Signed)
1 episode last week. ? ?UA today with 3+ leuks, positive nitrites. No blood.  ?Culture obtained. ? ?Treat with Macrobid 100 mg BID x 5 days. ?

## 2021-10-27 NOTE — Assessment & Plan Note (Signed)
Controlled. ? ?Continue losartan 25 mg daily. ? ?Her daughter and I had a discussion about goals of care and that she could refrain from taking blood pressure so frequently. ?

## 2021-10-29 ENCOUNTER — Other Ambulatory Visit: Payer: Self-pay | Admitting: Primary Care

## 2021-10-29 DIAGNOSIS — N390 Urinary tract infection, site not specified: Secondary | ICD-10-CM

## 2021-10-29 DIAGNOSIS — F03918 Unspecified dementia, unspecified severity, with other behavioral disturbance: Secondary | ICD-10-CM

## 2021-10-29 LAB — URINE CULTURE
MICRO NUMBER:: 13383274
SPECIMEN QUALITY:: ADEQUATE

## 2021-11-05 ENCOUNTER — Encounter: Payer: Self-pay | Admitting: Dermatology

## 2021-11-08 ENCOUNTER — Telehealth: Payer: Self-pay | Admitting: Student

## 2021-11-08 NOTE — Telephone Encounter (Signed)
Returned call to patient's daughter Nicole Downs, and we discussed the Palliative referral/services and all questions were answered and verbal consent was obtained to begin services.  I have scheduled a MyChart Palliative Consult for 11/15/21 @ 10 AM.

## 2021-11-15 ENCOUNTER — Telehealth: Payer: Medicare Other | Admitting: Student

## 2021-11-15 ENCOUNTER — Other Ambulatory Visit (INDEPENDENT_AMBULATORY_CARE_PROVIDER_SITE_OTHER): Payer: Medicare Other

## 2021-11-15 DIAGNOSIS — Z515 Encounter for palliative care: Secondary | ICD-10-CM | POA: Diagnosis not present

## 2021-11-15 DIAGNOSIS — F0394 Unspecified dementia, unspecified severity, with anxiety: Secondary | ICD-10-CM | POA: Diagnosis not present

## 2021-11-15 DIAGNOSIS — N390 Urinary tract infection, site not specified: Secondary | ICD-10-CM | POA: Diagnosis not present

## 2021-11-15 DIAGNOSIS — I10 Essential (primary) hypertension: Secondary | ICD-10-CM | POA: Diagnosis not present

## 2021-11-15 LAB — POCT URINALYSIS DIP (CLINITEK)
Bilirubin, UA: NEGATIVE
Blood, UA: NEGATIVE
Glucose, UA: NEGATIVE mg/dL
Ketones, POC UA: NEGATIVE mg/dL
Nitrite, UA: NEGATIVE
Spec Grav, UA: 1.02 (ref 1.010–1.025)
Urobilinogen, UA: 1 E.U./dL
pH, UA: 6 (ref 5.0–8.0)

## 2021-11-15 NOTE — Progress Notes (Signed)
[facility] Consult Note Telephone: [phone]  Fax: [phone]   Date of encounter: 11/15/21 10:13 AM PATIENT NAME: Nicole Downs [address] [place] [address]   [phone] (home)  DOB: [birth year] MRN: [id number] PRIMARY CARE PROVIDER:    Pleas Koch, NP,  [place] [address] [phone]  REFERRING PROVIDER:   Pleas Koch, NP [address] [place],  [place] [address] [phone]  RESPONSIBLE PARTY:    Contact Information     Name Relation Home Work Mobile   Dawson,Wanda Daughter [phone]           Due to the COVID-19 crisis, this visit was done via telemedicine from my office and it was initiated and consent by this patient and or family.  I connected with  [name] OR PROX[facility] on 11/15/21 by a video enabled telemedicine application and verified that I am speaking with the correct person using two identifiers.   I discussed the limitations of evaluation and management by telemedicine. The patient expressed understanding and agreed to proceed.                                     ASSESSMENT AND PLAN / RECOMMENDATIONS:   Advance Care Planning/Goals of Care: Goals include to maximize quality of life and symptom management. Patient/health care surrogate gave his/her permission to discuss.Our advance care planning conversation included a discussion about:    The value and importance of advance care planning  Experiences with loved ones who have been seriously ill or have died  Exploration of personal, cultural or spiritual beliefs that might influence medical decisions  Exploration of goals of care in the event of a sudden injury or illness  Introduced MOST form Reviewed code status; patient is a DNR.  CODE STATUS: DNR  Education provided on Palliative Medicine vs. Hospice. Introduced MOST form. Daughter expresses comfort measures, does not want aggressive  treatments although she has a hx of multiple skin cancers and plans to follow up on area that is changing to RLE in June. Daughter expresses caregiver fatigue; daughter interested in PACE program or adult day program. Will refer to palliative SW for long term planning.  Symptom Management/Plan:  Dementia with anxiety- reorient and redirect as needed. Attempt to assist patient with adl's, toileting as she has frequent UTI's. Monitor for worsening dysphagia, functional and cognitive declines. Monitor for falls/safety. Monitor for worsening behaviors, anxiety, agitation. Continue memantine BID, sertraline,  Buspar PRN as directed for anxiety/depression.   Recurrent UTI's- continue to offer fluids throughout the day, cranberry tablets and probiotics. Patient requires assistance with hygiene, toileting. Follow up with PCP regarding current UTI symptoms.   Hypertension-continue losartan 25 mg daily as directed.   Follow up Palliative Care Visit: Palliative care will continue to follow for complex medical decision making, advance care planning, and clarification of goals. Return in 4 weeks or prn.  This visit was coded based on medical decision making (MDM).  PPS: 50%  HOSPICE ELIGIBILITY/DIAGNOSIS: TBD  Chief Complaint: Palliative Medicine initial consult.   HISTORY OF PRESENT ILLNESS:  Nicole Downs is a 88 y.o. year old female  with dementia, recurrent UTI's, hypertension, pulmonary emphysema, hypothyroidism, essential tremors, Vitamin D deficiency, depression.  Patient currently resides at home with her daughter; she has lived with her for 6.5 years. She is requiring additional caregiver support. She has increased  difficulty ambulating, running into things. Last fall about 6 weeks ago; did hit her head and seen in UC. She has frequent UTI's; currently awaiting response from PCP due to malodorous, cloudy urine reported yesterday per daughter. She has had 4-5 UTI's in the past 6 months. Patient  will not change incontinence pads, poor hygiene. Daughter reports increased forgetfulness. She is sleeping more throughout the day; awake for meals. Daughter reports more withdrawn. Increased hearing difficulty, will not wear hearing aids. She is eating 25-30% of meals; although her weight has been steady. It is taking her longer to eat, around 45 minutes. Daughter does report some coughing with eating and drinking and she has increased difficulty chewing. HPI and ROS primarily obtained from family.   History obtained from review of EMR, discussion with primary team, and interview with family, facility staff/caregiver and/or Ms. Schoff.  I reviewed available labs, medications, imaging, studies and related documents from the EMR.  Records reviewed and summarized above.   ROS  Patient unable to substantially contribute due to her dementia and impaired hearing.  Physical Exam: Weight: 113 pounds Constitutional: NAD General: frail appearing, thin  EYES: anicteric sclera, lids intact, no discharge  ENMT: hard of hearing, dentition intact CV: no LE edema Pulmonary: no increased work of breathing, no cough, room air Abdomen: no ascites GU: deferred MSK: moves all extremities, ambulatory Skin: no rashes or wounds on visible skin Neuro:  + generalized weakness, A & O to person, familiars; tremor Psych: non-anxious affect Hem/lymph/immuno: no widespread bruising CURRENT PROBLEM LIST:  Patient Active Problem List   Diagnosis Date Noted   DNR (do not resuscitate) 10/27/2021   Gross hematuria 10/27/2021   Acute cough 04/15/2021   Skin tear of forearm without complication, left, initial encounter 02/01/2021   Pain due to onychomycosis of toenails of both feet 10/20/2019   Glaucoma 04/16/2019   Overgrown toenails 04/16/2019   Anemia 04/16/2019   Recurrent UTI 06/07/2018   Diverticulitis 05/02/2018   Moderate episode of recurrent major depressive disorder ([facility]) 04/10/2018   Pulmonary  emphysema ([facility]) 03/29/2017   Memory loss 08/09/2016   Essential tremor 04/10/2016   Vitamin D deficiency 10/08/2015   Lower extremity edema 10/08/2015   Essential hypertension 06/24/2015   Hypothyroidism 06/24/2015   Dementia ([facility]) 06/24/2015   Poor peripheral circulation 06/24/2015   PAST MEDICAL HISTORY:  Active Ambulatory Problems    Diagnosis Date Noted   Essential hypertension 06/24/2015   Hypothyroidism 06/24/2015   Dementia ([facility]) 06/24/2015   Poor peripheral circulation 06/24/2015   Vitamin D deficiency 10/08/2015   Lower extremity edema 10/08/2015   Essential tremor 04/10/2016   Memory loss 08/09/2016   Pulmonary emphysema ([facility]) 03/29/2017   Moderate episode of recurrent major depressive disorder ([facility]) 04/10/2018   Diverticulitis 05/02/2018   Recurrent UTI 06/07/2018   Glaucoma 04/16/2019   Overgrown toenails 04/16/2019   Anemia 04/16/2019   Pain due to onychomycosis of toenails of both feet 10/20/2019   Skin tear of forearm without complication, left, initial encounter 02/01/2021   Acute cough 04/15/2021   DNR (do not resuscitate) 10/27/2021   Gross hematuria 10/27/2021   Resolved Ambulatory Problems    Diagnosis Date Noted   Acute encephalopathy 04/06/2021   UTI (urinary tract infection) 04/06/2021   Past Medical History:  Diagnosis Date   Actinic keratosis    Acute confusion    Arthritis    Basal cell carcinoma 11/01/2015   Basal cell carcinoma 12/13/2015   Basal cell carcinoma 09/12/2017   Basal  cell carcinoma 10/24/2017   Basal cell carcinoma 12/05/2017   Basal cell carcinoma 11/27/2018   Basal cell carcinoma 06/25/2019   Basal cell carcinoma 12/01/2019   Basal cell carcinoma 04/19/2021   Emphysema lung ([facility])    Hypertension    Hyponatremia    Squamous cell carcinoma in situ (SCCIS) 09/15/2021   Squamous cell carcinoma of skin 09/20/2015   Squamous cell carcinoma of skin 02/01/2016   Squamous cell carcinoma of skin 10/24/2017   Squamous cell  carcinoma of skin 12/05/2017   Squamous cell carcinoma of skin 12/01/2019   Squamous cell carcinoma of skin 12/01/2019   Squamous cell carcinoma of skin 03/02/2020   Squamous cell carcinoma of skin 03/02/2020   Tremors of nervous system    SOCIAL HX:  Social History   Tobacco Use   Smoking status: Never   Smokeless tobacco: Never  Substance Use Topics   Alcohol use: No    Alcohol/week: 0.0 standard drinks   FAMILY HX:  Family History  Problem Relation Age of Onset   Heart attack Mother    Other Father        unsure - she was only 25 years old when he died      ALLERGIES:  Allergies  Allergen Reactions   Augmentin [Amoxicillin-Pot Clavulanate] Rash   Amlodipine Other (See Comments)    Ankle edema     PERTINENT MEDICATIONS:  Outpatient Encounter Medications as of 11/15/2021  Medication Sig   busPIRone (BUSPAR) 5 MG tablet TAKE 1 TABLET (5 MG TOTAL) BY MOUTH 2 (TWO) TIMES DAILY. AS NEEDED FOR ANXIETY.   Calcium Carbonate (CALCIUM 600 PO) Take 600 mg by mouth 2 (two) times daily.   Carboxymethylcellulose Sodium (REFRESH TEARS OP) Place 1 drop into both eyes in the morning and at bedtime.   Cholecalciferol (VITAMIN D3) 10000 units TABS Take 1,000 mg by mouth daily.   GLUCOSAMINE-CHONDROITIN PO Take 1 capsule by mouth daily.   levobunolol (BETAGAN) 0.5 % ophthalmic solution Place 1 drop into both eyes 2 (two) times daily.   levothyroxine (SYNTHROID) 25 MCG tablet TAKE 1 TABLET BY MOUTH EVERY MORNING ON AN EMPTY STOMACH WITH WATER ONLY. NO FOOD OR OTHER MEDICATIONS FOR 30 MINUTES. OFFICE VISIT REQUIRED FOR FURTHER REFILLS.   losartan (COZAAR) 25 MG tablet TAKE 1 TABLET BY MOUTH EVERY DAY FOR BLOOD PRESSURE   melatonin 1 MG TABS tablet Take 1 mg by mouth at bedtime.   memantine (NAMENDA) 5 MG tablet TAKE 1 TABLET (5 MG TOTAL) BY MOUTH 2 (TWO) TIMES DAILY. FOR MEMORY AND BEHAVIOR   sertraline (ZOLOFT) 25 MG tablet TAKE 1 TABLET BY MOUTH AT BEDTIME FOR DEPRESSION   No  facility-administered encounter medications on file as of 11/15/2021.   Thank you for the opportunity to participate in the care of Ms. Degenhart.  The palliative care team will continue to follow. Please call our office at [phone] if we can be of additional assistance.   Ezekiel Slocumb, NP   COVID-19 PATIENT SCREENING TOOL Asked and negative response unless otherwise noted:  Have you had symptoms of covid, tested positive or been in contact with someone with symptoms/positive test in the past 5-10 days? No

## 2021-11-15 NOTE — Telephone Encounter (Signed)
Joellen, FYI 

## 2021-11-16 ENCOUNTER — Other Ambulatory Visit: Payer: Medicare Other

## 2021-11-16 DIAGNOSIS — Z789 Other specified health status: Secondary | ICD-10-CM

## 2021-11-16 NOTE — Progress Notes (Signed)
Palliative care SW outreached patient to complete telephonic visit.    Palliative care SW outreached patient to complete telephonic follow up call per [facility] NP - L. Rivers request to assess and address patient needs.  Patients daughter, [name], endorses some caregiver fatigue and expressed interest in discussing in home support and respite stay options.   SW and daughter discussed different in home care options to include PCS, as patient has Medicaid.   Day program options were discussed as well, to include the PACE program. SW advised daughter that this program is covered by [facility] and Medicaid.  Respite: Daughter shared that patient has been residing with her and her husband for the past 6 years and they have not have any down time or support and wishes to possibly plan a trip. SW educated daughter on respite stays at different facilities and the possibly of paying private for these short term stays.  Daughter expressed discouragement, as she nor patient have funds to privately. SW advised daughter that she can always outreach the facility and inquire if Medicaid will cover short term respite, as most times Medicaid requires a patient to be in a facility for no shorter than 30 days for reimbursement purposes.  Long term planning: Daughter shares that had considered placement at [facility] some time for LTC placement but became discouraged when she was told that they require full month of payment up from while waiting on MCD approval, as well as being told that patient had to have a medical need from the hospital in order to be admitted. SW and daughter discussed ALF placement options and the cost difference between SNF and ALF. Patients Medicaid is currently active through [facility], SW advised daughter to have it switched to [employer].   Resources: SW emailed daughter the following resources to consider:  Assisted Living facilities to consider for short term respite stay (possibly private  pay) or long term placement (Medicaid covers):  [address]   [address]; [place], [place] [address]  [phone]     [facility]   [address] [place], [place] [address]  [phone]  [address] [place], [place] [address]  [phone]   [facility] [address] [place], [place] [address] [phone]  **In order to move forward with permanent long term placement at any facility in [facility], [place] will need to be transferred from [facility] to [facility] first. This will be the same requirement for the PACE program as well, since PACE is also covered by Medicaid**  Medicaid [place]  These services benefit individuals who require assistance with activities of daily living (ADLs), including: Eating, Dressing, Bathing, Toileting, Mobility. To qualify for PCS, an individual must have a medical condition, disability or cognitive impairment, and demonstrates unmet needs for:  -Three of the five ADLs with limited hands-on assistance  -Two ADLs, one of which requires extensive assistance  -Two ADLs, one of which requires assistance at the full dependence level  PCS program eligibility is determined by an independent assessment conducted by the Division of Medical Assistance (DMA) or its designee, and is provided according to an individualized care plan. [email or web address]     Palliative care will continue to monitor and assist with long term care planning as needed.

## 2021-11-18 ENCOUNTER — Other Ambulatory Visit: Payer: Self-pay | Admitting: Primary Care

## 2021-11-18 DIAGNOSIS — N3000 Acute cystitis without hematuria: Secondary | ICD-10-CM

## 2021-11-18 DIAGNOSIS — N390 Urinary tract infection, site not specified: Secondary | ICD-10-CM

## 2021-11-18 LAB — URINE CULTURE
MICRO NUMBER:: 13458360
SPECIMEN QUALITY:: ADEQUATE

## 2021-11-18 MED ORDER — FOSFOMYCIN TROMETHAMINE 3 G PO PACK: PACK | ORAL | 0 refills | Status: DC

## 2021-11-18 MED ORDER — NITROFURANTOIN MONOHYD MACRO 100 MG PO CAPS
100.0000 mg | ORAL_CAPSULE | Freq: Two times a day (BID) | ORAL | 0 refills | Status: AC
Start: 2021-11-18 — End: 2021-11-25

## 2021-11-20 NOTE — Telephone Encounter (Signed)
Spoke with patient via phone.

## 2021-11-22 ENCOUNTER — Ambulatory Visit (INDEPENDENT_AMBULATORY_CARE_PROVIDER_SITE_OTHER): Payer: Medicare Other | Admitting: Primary Care

## 2021-11-22 ENCOUNTER — Encounter: Payer: Self-pay | Admitting: Primary Care

## 2021-11-22 VITALS — BP 128/76 | HR 58 | Temp 97.6°F | Ht 59.0 in | Wt 107.0 lb

## 2021-11-22 DIAGNOSIS — F0394 Unspecified dementia, unspecified severity, with anxiety: Secondary | ICD-10-CM | POA: Diagnosis not present

## 2021-11-22 DIAGNOSIS — N390 Urinary tract infection, site not specified: Secondary | ICD-10-CM | POA: Diagnosis not present

## 2021-11-22 LAB — CBC WITH DIFFERENTIAL/PLATELET
Basophils Absolute: 0.1 10*3/uL (ref 0.0–0.1)
Basophils Relative: 0.6 % (ref 0.0–3.0)
Eosinophils Absolute: 0.5 10*3/uL (ref 0.0–0.7)
Eosinophils Relative: 4.2 % (ref 0.0–5.0)
HCT: 36.5 % (ref 36.0–46.0)
Hemoglobin: 12.2 g/dL (ref 12.0–15.0)
Lymphocytes Relative: 10.7 % — ABNORMAL LOW (ref 12.0–46.0)
Lymphs Abs: 1.3 10*3/uL (ref 0.7–4.0)
MCHC: 33.4 g/dL (ref 30.0–36.0)
MCV: 93.7 fl (ref 78.0–100.0)
Monocytes Absolute: 1.3 10*3/uL — ABNORMAL HIGH (ref 0.1–1.0)
Monocytes Relative: 10.6 % (ref 3.0–12.0)
Neutro Abs: 9.2 10*3/uL — ABNORMAL HIGH (ref 1.4–7.7)
Neutrophils Relative %: 73.9 % (ref 43.0–77.0)
Platelets: 241 10*3/uL (ref 150.0–400.0)
RBC: 3.89 Mil/uL (ref 3.87–5.11)
RDW: 13.1 % (ref 11.5–15.5)
WBC: 12.5 10*3/uL — ABNORMAL HIGH (ref 4.0–10.5)

## 2021-11-22 LAB — BASIC METABOLIC PANEL
BUN: 33 mg/dL — ABNORMAL HIGH (ref 6–23)
CO2: 29 mEq/L (ref 19–32)
Calcium: 12 mg/dL — ABNORMAL HIGH (ref 8.4–10.5)
Chloride: 100 mEq/L (ref 96–112)
Creatinine, Ser: 1.33 mg/dL — ABNORMAL HIGH (ref 0.40–1.20)
GFR: 33.84 mL/min — ABNORMAL LOW (ref >60.00)
Glucose, Bld: 81 mg/dL (ref 70–99)
Potassium: 4 mEq/L (ref 3.5–5.1)
Sodium: 138 mEq/L (ref 135–145)

## 2021-11-22 NOTE — Progress Notes (Signed)
Subjective:    Patient ID: Nicole Downs, female    DOB: 1924/06/19, 88 y.o.   MRN: [id number]  HPI  Nicole Downs is a very pleasant 88 y.o. female with a history of dementia, MDD, recurrent UTI, glaucoma, hypertension, hypothyroidism who presents today with her daughter to discuss behavioral changes. Her daughter provides information for HPI.   Currently managed on a seven day course of Macrobid 100 mg BID for acute cystitis, E. Coli positive. Previously treated for acute cystitis, E. Coli positive, a few weeks prior with five day Macrobid 100 mg BID course for which she completed.   Her daughter contacted [place] last week reporting progressing odd behavior such as wearing one shoe, not wearing her glasses, decreased appetite, increased sleeping, unsanitary toileting with soiling her pants. Given these behavorial changes she was asked to come today.   She is compliant to her Macrobid 100 mg BID. She continues to not eat much, stares at her food, plays with her feces, she sleeps most of the day. She is compliant to her buspirone 5 mg BID PRN, namenda 5 mg in AM and 10 mg in PM.   Her daughter has met with palliative care to discuss future care plans, she was told to start making some decisions. Her daughter does not have health care power of attorney but does have general power of attorney. The palliative care nurse is scheduled to visit her home later this month.    The patient has no concerns today. She denies abdominal pain.  BP Readings from Last 3 Encounters:  11/22/21 128/76  10/27/21 130/74  09/30/21 (S) (!) 171/98     Review of Systems  Constitutional:  Negative for fever.  Respiratory:  Negative for shortness of breath.   Gastrointestinal:  Negative for abdominal pain.  Genitourinary:  Negative for hematuria.        Past Medical History:  Diagnosis Date   Actinic keratosis    Acute confusion    Acute encephalopathy 04/06/2021   Arthritis    Basal cell  carcinoma 11/01/2015   L lat ankle   Basal cell carcinoma 12/13/2015   L dorsum foot   Basal cell carcinoma 09/12/2017   nasal root, R nasal bridge, L nasal bridge, R mandible infra auricular   Basal cell carcinoma 10/24/2017   R lateral foot   Basal cell carcinoma 12/05/2017   L dorsum foot   Basal cell carcinoma 11/27/2018   R of midline mid forehead   Basal cell carcinoma 06/25/2019   nasal bridge   Basal cell carcinoma 12/01/2019   Junction of right mandible and earlobe. Nodular pattern.   Basal cell carcinoma 04/19/2021   right inferior ear at ear lobe, EDC   Emphysema lung ([place])    Glaucoma    Hypertension    Hyponatremia    Hypothyroidism    Memory loss    Poor peripheral circulation    Squamous cell carcinoma in situ (SCCIS) 09/15/2021   right medial clavicle, EDC   Squamous cell carcinoma of skin 09/20/2015   R prox mandible/in situ   Squamous cell carcinoma of skin 02/01/2016   nose   Squamous cell carcinoma of skin 10/24/2017   R lateral knee/in situ, R lat ankle/in situ   Squamous cell carcinoma of skin 12/05/2017   L medial calf/in situ   Squamous cell carcinoma of skin 12/01/2019   Right neck sup. SCCis, hypertrophic EDC, 03/30/21 LN2 for recurrence   Squamous cell carcinoma of skin 12/01/2019  Right neck inf.  SCCis, hypertrophic EDC, 03/30/21 LN2 fo recurrence   Squamous cell carcinoma of skin 03/02/2020   Left upper arm, proximal. SCCis   Squamous cell carcinoma of skin 03/02/2020   Left upper arm, distal. SCCis   Tremors of nervous system     Social History   Socioeconomic History   Marital status: Widowed    Spouse name: Not on file   Number of children: 1   Years of education: 11th grade   Highest education level: Not on file  Occupational History   Occupation: Retired  Tobacco Use   Smoking status: Never   Smokeless tobacco: Never  Vaping Use   Vaping Use: Never used  Substance and Sexual Activity   Alcohol use: No    Alcohol/week:  0.0 standard drinks   Drug use: No   Sexual activity: Not Currently  Other Topics Concern   Not on file  Social History Narrative   Lives with daughter and son-in-law.   Right-handed.   Drinks one soda per day.   Social Determinants of Health   Financial Resource Strain: Low Risk    Difficulty of Paying Living Expenses: Not hard at all  Food Insecurity: No Food Insecurity   Worried About Charity fundraiser in the Last Year: Never true   [place] in the Last Year: Never true  Transportation Needs: No Transportation Needs   Lack of Transportation (Medical): No   Lack of Transportation (Non-Medical): No  Physical Activity: Inactive   Days of Exercise per Week: 0 days   Minutes of Exercise per Session: 0 min  Stress: No Stress Concern Present   Feeling of Stress : Not at all  Social Connections: Moderately Isolated   Frequency of Communication with Friends and Family: Never   Frequency of Social Gatherings with Friends and Family: More than three times a week   Attends Religious Services: 1 to 4 times per year   Active Member of [employer] or Organizations: No   Attends Archivist Meetings: Never   Marital Status: Widowed  Human resources officer Violence: Not At Risk   Fear of Current or Ex-Partner: No   Emotionally Abused: No   Physically Abused: No   Sexually Abused: No    Past Surgical History:  Procedure Laterality Date   BREAST BIOPSY      Family History  Problem Relation Age of Onset   Heart attack Mother    Other Father        unsure - she was only 80 years old when he died    Allergies  Allergen Reactions   Augmentin [Amoxicillin-Pot Clavulanate] Rash   Amlodipine Other (See Comments)    Ankle edema    Current Outpatient Medications on File Prior to Visit  Medication Sig Dispense Refill   busPIRone (BUSPAR) 5 MG tablet TAKE 1 TABLET (5 MG TOTAL) BY MOUTH 2 (TWO) TIMES DAILY. AS NEEDED FOR ANXIETY. 180 tablet 1   Calcium Carbonate (CALCIUM 600 PO)  Take 600 mg by mouth 2 (two) times daily.     Carboxymethylcellulose Sodium (REFRESH TEARS OP) Place 1 drop into both eyes in the morning and at bedtime.     Cholecalciferol (VITAMIN D3) 10000 units TABS Take 1,000 mg by mouth daily.     GLUCOSAMINE-CHONDROITIN PO Take 1 capsule by mouth daily.     levobunolol (BETAGAN) 0.5 % ophthalmic solution Place 1 drop into both eyes 2 (two) times daily.     levothyroxine (SYNTHROID)  25 MCG tablet TAKE 1 TABLET BY MOUTH EVERY MORNING ON AN EMPTY STOMACH WITH WATER ONLY. NO FOOD OR OTHER MEDICATIONS FOR 30 MINUTES. OFFICE VISIT REQUIRED FOR FURTHER REFILLS. 90 tablet 3   losartan (COZAAR) 25 MG tablet TAKE 1 TABLET BY MOUTH EVERY DAY FOR BLOOD PRESSURE 90 tablet 3   melatonin 1 MG TABS tablet Take 1 mg by mouth at bedtime.     memantine (NAMENDA) 5 MG tablet TAKE 1 TABLET (5 MG TOTAL) BY MOUTH 2 (TWO) TIMES DAILY. FOR MEMORY AND BEHAVIOR 180 tablet 1   nitrofurantoin, macrocrystal-monohydrate, (MACROBID) 100 MG capsule Take 1 capsule (100 mg total) by mouth 2 (two) times daily for 7 days. 14 capsule 0   sertraline (ZOLOFT) 25 MG tablet TAKE 1 TABLET BY MOUTH AT BEDTIME FOR DEPRESSION 90 tablet 3   No current facility-administered medications on file prior to visit.    BP 128/76   Pulse (!) 58   Temp 97.6 F (36.4 C) (Oral)   Ht $R'4\' 11"'Ma$  (1.499 m)   Wt 107 lb (48.5 kg)   SpO2 99%   BMI 21.61 kg/m  Objective:   Physical Exam Cardiovascular:     Rate and Rhythm: Normal rate and regular rhythm.  Pulmonary:     Effort: Pulmonary effort is normal.     Breath sounds: Normal breath sounds.  Musculoskeletal:     Cervical back: Neck supple.  Skin:    General: Skin is warm and dry.  Neurological:     Mental Status: She is alert.     Comments: Patient responds to her name, able to answer simple questions.   Psychiatric:        Mood and Affect: Mood normal.          Assessment & Plan:

## 2021-11-22 NOTE — Assessment & Plan Note (Addendum)
Suspect last UTI was not resolved.  Checking CBC and BMP today given behavorial changes.   Continue Macrobid 100 mg BID until complete.  She is not a candidate for daily prophylaxis for UTI, this is per Urology.   Will repeat Urine culture 2 weeks after she completes Macrobid course.   Her daughter will meet with the palliative care team later this month to discuss future care plan.

## 2021-11-22 NOTE — Assessment & Plan Note (Signed)
Progressing.  Long discussion today with daughter regarding future care plan, especially given her overall decline and recurrent UTI.  Her daughter will meet with palliative care later this week to discuss the plan moving forward.   Continue Namenda 5 mg in AM and 10 mg in PM. Continue Buspar 5 mg BID PRN.  DNR was signed during last visit

## 2021-11-22 NOTE — Patient Instructions (Signed)
Stop by the lab prior to leaving today. I will notify you of your results once received.   Schedule a lab appointment for 2 weeks after she finishes the antibiotics to repeat the urine test.   It was a pleasure to see you today!

## 2021-11-23 ENCOUNTER — Other Ambulatory Visit: Payer: Self-pay | Admitting: Primary Care

## 2021-11-23 DIAGNOSIS — N390 Urinary tract infection, site not specified: Secondary | ICD-10-CM

## 2021-11-27 ENCOUNTER — Other Ambulatory Visit: Payer: Self-pay | Admitting: Primary Care

## 2021-11-27 DIAGNOSIS — F03918 Unspecified dementia, unspecified severity, with other behavioral disturbance: Secondary | ICD-10-CM

## 2021-11-28 ENCOUNTER — Encounter (HOSPITAL_COMMUNITY): Payer: Self-pay

## 2021-11-28 ENCOUNTER — Other Ambulatory Visit: Payer: Self-pay

## 2021-11-28 ENCOUNTER — Emergency Department (HOSPITAL_COMMUNITY): Payer: Medicare Other

## 2021-11-28 ENCOUNTER — Inpatient Hospital Stay (HOSPITAL_COMMUNITY)
Admission: EM | Admit: 2021-11-28 | Discharge: 2021-12-01 | DRG: 689 | Disposition: A | Discharge status: Hospice | Payer: Medicare Other | Attending: Internal Medicine | Admitting: Internal Medicine

## 2021-11-28 DIAGNOSIS — F0393 Unspecified dementia, unspecified severity, with mood disturbance: Secondary | ICD-10-CM | POA: Diagnosis present

## 2021-11-28 DIAGNOSIS — R5381 Other malaise: Secondary | ICD-10-CM | POA: Diagnosis not present

## 2021-11-28 DIAGNOSIS — Z881 Allergy status to other antibiotic agents status: Secondary | ICD-10-CM

## 2021-11-28 DIAGNOSIS — G9341 Metabolic encephalopathy: Secondary | ICD-10-CM | POA: Diagnosis not present

## 2021-11-28 DIAGNOSIS — K449 Diaphragmatic hernia without obstruction or gangrene: Secondary | ICD-10-CM

## 2021-11-28 DIAGNOSIS — Z515 Encounter for palliative care: Secondary | ICD-10-CM | POA: Diagnosis not present

## 2021-11-28 DIAGNOSIS — R627 Adult failure to thrive: Secondary | ICD-10-CM | POA: Diagnosis present

## 2021-11-28 DIAGNOSIS — Z79899 Other long term (current) drug therapy: Secondary | ICD-10-CM

## 2021-11-28 DIAGNOSIS — S199XXA Unspecified injury of neck, initial encounter: Secondary | ICD-10-CM | POA: Diagnosis not present

## 2021-11-28 DIAGNOSIS — Z66 Do not resuscitate: Secondary | ICD-10-CM | POA: Diagnosis not present

## 2021-11-28 DIAGNOSIS — Z85828 Personal history of other malignant neoplasm of skin: Secondary | ICD-10-CM | POA: Diagnosis not present

## 2021-11-28 DIAGNOSIS — L89151 Pressure ulcer of sacral region, stage 1: Secondary | ICD-10-CM | POA: Diagnosis present

## 2021-11-28 DIAGNOSIS — I10 Essential (primary) hypertension: Secondary | ICD-10-CM | POA: Diagnosis not present

## 2021-11-28 DIAGNOSIS — E8809 Other disorders of plasma-protein metabolism, not elsewhere classified: Secondary | ICD-10-CM | POA: Diagnosis present

## 2021-11-28 DIAGNOSIS — K5900 Constipation, unspecified: Secondary | ICD-10-CM | POA: Diagnosis present

## 2021-11-28 DIAGNOSIS — E440 Moderate protein-calorie malnutrition: Secondary | ICD-10-CM | POA: Diagnosis present

## 2021-11-28 DIAGNOSIS — G309 Alzheimer's disease, unspecified: Secondary | ICD-10-CM | POA: Diagnosis not present

## 2021-11-28 DIAGNOSIS — N390 Urinary tract infection, site not specified: Principal | ICD-10-CM | POA: Diagnosis present

## 2021-11-28 DIAGNOSIS — Z87440 Personal history of urinary (tract) infections: Secondary | ICD-10-CM

## 2021-11-28 DIAGNOSIS — Z6821 Body mass index (BMI) 21.0-21.9, adult: Secondary | ICD-10-CM | POA: Diagnosis not present

## 2021-11-28 DIAGNOSIS — R918 Other nonspecific abnormal finding of lung field: Secondary | ICD-10-CM | POA: Diagnosis not present

## 2021-11-28 DIAGNOSIS — J439 Emphysema, unspecified: Secondary | ICD-10-CM | POA: Diagnosis present

## 2021-11-28 DIAGNOSIS — Z8701 Personal history of pneumonia (recurrent): Secondary | ICD-10-CM | POA: Diagnosis not present

## 2021-11-28 DIAGNOSIS — E039 Hypothyroidism, unspecified: Secondary | ICD-10-CM | POA: Diagnosis present

## 2021-11-28 DIAGNOSIS — L8990 Pressure ulcer of unspecified site, unspecified stage: Secondary | ICD-10-CM | POA: Diagnosis present

## 2021-11-28 DIAGNOSIS — N179 Acute kidney failure, unspecified: Secondary | ICD-10-CM | POA: Diagnosis present

## 2021-11-28 DIAGNOSIS — M50322 Other cervical disc degeneration at C5-C6 level: Secondary | ICD-10-CM | POA: Diagnosis not present

## 2021-11-28 DIAGNOSIS — I129 Hypertensive chronic kidney disease with stage 1 through stage 4 chronic kidney disease, or unspecified chronic kidney disease: Secondary | ICD-10-CM | POA: Diagnosis present

## 2021-11-28 DIAGNOSIS — Z8249 Family history of ischemic heart disease and other diseases of the circulatory system: Secondary | ICD-10-CM

## 2021-11-28 DIAGNOSIS — H409 Unspecified glaucoma: Secondary | ICD-10-CM | POA: Diagnosis present

## 2021-11-28 DIAGNOSIS — F03C0 Unspecified dementia, severe, without behavioral disturbance, psychotic disturbance, mood disturbance, and anxiety: Secondary | ICD-10-CM | POA: Diagnosis not present

## 2021-11-28 DIAGNOSIS — Z1624 Resistance to multiple antibiotics: Secondary | ICD-10-CM | POA: Diagnosis present

## 2021-11-28 DIAGNOSIS — M50323 Other cervical disc degeneration at C6-C7 level: Secondary | ICD-10-CM | POA: Diagnosis not present

## 2021-11-28 DIAGNOSIS — R531 Weakness: Secondary | ICD-10-CM | POA: Diagnosis not present

## 2021-11-28 DIAGNOSIS — Z79890 Hormone replacement therapy (postmenopausal): Secondary | ICD-10-CM

## 2021-11-28 DIAGNOSIS — M4319 Spondylolisthesis, multiple sites in spine: Secondary | ICD-10-CM | POA: Diagnosis not present

## 2021-11-28 DIAGNOSIS — Z7401 Bed confinement status: Secondary | ICD-10-CM | POA: Diagnosis not present

## 2021-11-28 DIAGNOSIS — Z1612 Extended spectrum beta lactamase (ESBL) resistance: Secondary | ICD-10-CM | POA: Diagnosis present

## 2021-11-28 DIAGNOSIS — N1831 Chronic kidney disease, stage 3a: Secondary | ICD-10-CM | POA: Diagnosis present

## 2021-11-28 DIAGNOSIS — J8410 Pulmonary fibrosis, unspecified: Secondary | ICD-10-CM | POA: Diagnosis present

## 2021-11-28 DIAGNOSIS — K5730 Diverticulosis of large intestine without perforation or abscess without bleeding: Secondary | ICD-10-CM | POA: Diagnosis not present

## 2021-11-28 DIAGNOSIS — S0990XA Unspecified injury of head, initial encounter: Secondary | ICD-10-CM | POA: Diagnosis not present

## 2021-11-28 DIAGNOSIS — I358 Other nonrheumatic aortic valve disorders: Secondary | ICD-10-CM | POA: Diagnosis not present

## 2021-11-28 DIAGNOSIS — I2510 Atherosclerotic heart disease of native coronary artery without angina pectoris: Secondary | ICD-10-CM | POA: Diagnosis present

## 2021-11-28 DIAGNOSIS — J84112 Idiopathic pulmonary fibrosis: Secondary | ICD-10-CM | POA: Diagnosis present

## 2021-11-28 DIAGNOSIS — W19XXXA Unspecified fall, initial encounter: Secondary | ICD-10-CM | POA: Diagnosis not present

## 2021-11-28 DIAGNOSIS — R9082 White matter disease, unspecified: Secondary | ICD-10-CM | POA: Diagnosis not present

## 2021-11-28 DIAGNOSIS — F02C11 Dementia in other diseases classified elsewhere, severe, with agitation: Secondary | ICD-10-CM | POA: Diagnosis not present

## 2021-11-28 DIAGNOSIS — Z888 Allergy status to other drugs, medicaments and biological substances status: Secondary | ICD-10-CM

## 2021-11-28 DIAGNOSIS — E860 Dehydration: Secondary | ICD-10-CM | POA: Diagnosis present

## 2021-11-28 DIAGNOSIS — K828 Other specified diseases of gallbladder: Secondary | ICD-10-CM | POA: Diagnosis not present

## 2021-11-28 DIAGNOSIS — Z043 Encounter for examination and observation following other accident: Secondary | ICD-10-CM | POA: Diagnosis not present

## 2021-11-28 DIAGNOSIS — R6251 Failure to thrive (child): Secondary | ICD-10-CM

## 2021-11-28 LAB — CBC WITH DIFFERENTIAL/PLATELET
Abs Immature Granulocytes: 0.05 10*3/uL (ref 0.00–0.07)
Basophils Absolute: 0.1 10*3/uL (ref 0.0–0.1)
Basophils Relative: 1 %
Eosinophils Absolute: 0.1 10*3/uL (ref 0.0–0.5)
Eosinophils Relative: 1 %
HCT: 37.2 % (ref 36.0–46.0)
Hemoglobin: 12.2 g/dL (ref 12.0–15.0)
Immature Granulocytes: 1 %
Lymphocytes Relative: 20 %
Lymphs Abs: 2.2 10*3/uL (ref 0.7–4.0)
MCH: 31.3 pg (ref 26.0–34.0)
MCHC: 32.8 g/dL (ref 30.0–36.0)
MCV: 95.4 fL (ref 80.0–100.0)
Monocytes Absolute: 0.9 10*3/uL (ref 0.1–1.0)
Monocytes Relative: 9 %
Neutro Abs: 7.4 10*3/uL (ref 1.7–7.7)
Neutrophils Relative %: 68 %
Platelets: 259 10*3/uL (ref 150–400)
RBC: 3.9 MIL/uL (ref 3.87–5.11)
RDW: 13 % (ref 11.5–15.5)
WBC: 10.7 10*3/uL — ABNORMAL HIGH (ref 4.0–10.5)
nRBC: 0 % (ref 0.0–0.2)

## 2021-11-28 LAB — URINALYSIS, ROUTINE W REFLEX MICROSCOPIC
Bilirubin Urine: NEGATIVE
Glucose, UA: NEGATIVE mg/dL
Hgb urine dipstick: NEGATIVE
Ketones, ur: NEGATIVE mg/dL
Nitrite: NEGATIVE
Protein, ur: NEGATIVE mg/dL
Specific Gravity, Urine: 1.013 (ref 1.005–1.030)
pH: 5 (ref 5.0–8.0)

## 2021-11-28 LAB — COMPREHENSIVE METABOLIC PANEL
ALT: 10 U/L (ref 0–44)
AST: 19 U/L (ref 15–41)
Albumin: 3.1 g/dL — ABNORMAL LOW (ref 3.5–5.0)
Alkaline Phosphatase: 69 U/L (ref 38–126)
Anion gap: 10 (ref 5–15)
BUN: 28 mg/dL — ABNORMAL HIGH (ref 8–23)
CO2: 25 mmol/L (ref 22–32)
Calcium: 12.3 mg/dL — ABNORMAL HIGH (ref 8.9–10.3)
Chloride: 104 mmol/L (ref 98–111)
Creatinine, Ser: 1.54 mg/dL — ABNORMAL HIGH (ref 0.44–1.00)
GFR, Estimated: 31 mL/min — ABNORMAL LOW (ref >60)
Glucose, Bld: 99 mg/dL (ref 70–99)
Potassium: 4.2 mmol/L (ref 3.5–5.1)
Sodium: 139 mmol/L (ref 135–145)
Total Bilirubin: 1 mg/dL (ref 0.3–1.2)
Total Protein: 6.8 g/dL (ref 6.5–8.1)

## 2021-11-28 LAB — CBG MONITORING, ED: Glucose-Capillary: 93 mg/dL (ref 70–99)

## 2021-11-28 LAB — LACTIC ACID, PLASMA: Lactic Acid, Venous: 0.8 mmol/L (ref 0.5–1.9)

## 2021-11-28 LAB — TSH: TSH: 1.47 u[IU]/mL (ref 0.350–4.500)

## 2021-11-28 MED ORDER — SODIUM CHLORIDE 0.9 % IV SOLN
2.0000 g | Freq: Once | INTRAVENOUS | Status: AC
  Administered 2021-11-28: 2 g via INTRAVENOUS
  Filled 2021-11-28: qty 12.5

## 2021-11-28 MED ORDER — SODIUM CHLORIDE 0.9 % IV BOLUS
500.0000 mL | Freq: Once | INTRAVENOUS | Status: AC
  Administered 2021-11-28: 500 mL via INTRAVENOUS

## 2021-11-28 MED ORDER — FLEET ENEMA 7-19 GM/118ML RE ENEM: 1.0000 | ENEMA | Freq: Once | RECTAL | Status: DC

## 2021-11-28 MED ORDER — SODIUM CHLORIDE 0.9 % IV SOLN: INTRAVENOUS | Status: DC

## 2021-11-28 NOTE — ED Provider Triage Note (Signed)
Emergency Medicine Provider Triage Evaluation Note  Ericha Whittingham , a 88 y.o. female  was evaluated in triage.  Pt complains of recent falls.  Not on anticoagulation.  Hx of dementia.  Fell twice, once Saturday once yesterday.  Complaining of neck pain.  Unsure if hit head.  No other bodily complaints.  Review of Systems  Positive:  Negative: As above  Physical Exam  BP (!) 132/105   Pulse 71   Temp 98.1 F (36.7 C) (Oral)   Resp 12   SpO2 98%  Gen:   Awake, alert to person and place Resp:  Normal effort  MSK:   Moves extremities without difficulty  Other:  Cervical spine midline tenderness, possible hematoma of the posterior head  Medical Decision Making  Medically screening exam initiated at 1:45 PM.  Appropriate orders placed.  [name] was informed that the remainder of the evaluation will be completed by another provider, this initial triage assessment does not replace that evaluation, and the importance of remaining in the ED until their evaluation is complete.  Unable to establish baseline, known Hx of dementia.  Discussed with ED triage nurse, patient needs to be brought back to next available room.   Prince Rome, PA-C 65/78/46 [id number]

## 2021-11-28 NOTE — H&P (Signed)
History and Physical    Nicole Downs GLO:[id number] DOB: [birth year] DOA: 11/28/2021  PCP: Pleas Koch, NP  Patient coming from: Home  Chief Complaint: Falls  HPI: Nicole Downs is a 88 y.o. female with medical history significant of dementia, chronic tremors, MDD, recurrent UTIs/history of ESBL UTI, glaucoma, hypertension, hypothyroidism, CKD stage IIIa, history of skin cancer presented to the ED for evaluation after 2 recent falls yesterday and today.  Daughter reported poor appetite and increased somnolence.  Recently finished a 7-day course of Macrobid for UTI on 6/9.  Daughter/ HCPOA told ED physician that patient is a DNR and they are working on starting palliative care.  In the ED, vital signs stable.  Labs showing WBC 10.7, hemoglobin 12.2, platelet count 259k.  Sodium 139, potassium 4.2, chloride 104, bicarb 25, BUN 28, creatinine 1.5 (baseline 1.0-1.2), glucose 99, calcium 12.3, albumin 3.1.  LFTs normal.  Lactic acid normal.  TSH normal.  UA with negative nitrite, small amount of leukocytes, and microscopy showing 21-50 WBCs and many bacteria.  Urine culture pending.  CT head/C-spine negative for acute finding.    CT chest/abdomen/pelvis negative for acute finding.  Showing distended gallbladder similar in appearance to prior examination dated 2019 without calculi, gallbladder wall thickening, pericholecystic fluid, or biliary ductal dilatation.  Showing findings most consistent with a probable UIP pattern of pulmonary fibrosis.  Large hiatal hernia with intrathoracic position of the gastric body and fundus.  Moderate burden of stool throughout the colon and rectum with small balls in the rectum.  Coronary artery disease.  Aortic valve calcifications.  Patient was given cefepime and 500 cc normal saline bolus.  Patient is not able to give any history.  She knows her name and thinks she is at [facility].  Review of Systems:  Review of Systems  Reason unable  to perform ROS: AMS.    Past Medical History:  Diagnosis Date   Actinic keratosis    Acute confusion    Acute encephalopathy 04/06/2021   Arthritis    Basal cell carcinoma 11/01/2015   L lat ankle   Basal cell carcinoma 12/13/2015   L dorsum foot   Basal cell carcinoma 09/12/2017   nasal root, R nasal bridge, L nasal bridge, R mandible infra auricular   Basal cell carcinoma 10/24/2017   R lateral foot   Basal cell carcinoma 12/05/2017   L dorsum foot   Basal cell carcinoma 11/27/2018   R of midline mid forehead   Basal cell carcinoma 06/25/2019   nasal bridge   Basal cell carcinoma 12/01/2019   Junction of right mandible and earlobe. Nodular pattern.   Basal cell carcinoma 04/19/2021   right inferior ear at ear lobe, EDC   Emphysema lung ([place])    Glaucoma    Hypertension    Hyponatremia    Hypothyroidism    Memory loss    Poor peripheral circulation    Squamous cell carcinoma in situ (SCCIS) 09/15/2021   right medial clavicle, EDC   Squamous cell carcinoma of skin 09/20/2015   R prox mandible/in situ   Squamous cell carcinoma of skin 02/01/2016   nose   Squamous cell carcinoma of skin 10/24/2017   R lateral knee/in situ, R lat ankle/in situ   Squamous cell carcinoma of skin 12/05/2017   L medial calf/in situ   Squamous cell carcinoma of skin 12/01/2019   Right neck sup. SCCis, hypertrophic EDC, 03/30/21 LN2 for recurrence   Squamous cell carcinoma of skin 12/01/2019  Right neck inf.  SCCis, hypertrophic EDC, 03/30/21 LN2 fo recurrence   Squamous cell carcinoma of skin 03/02/2020   Left upper arm, proximal. SCCis   Squamous cell carcinoma of skin 03/02/2020   Left upper arm, distal. SCCis   Tremors of nervous system     Past Surgical History:  Procedure Laterality Date   BREAST BIOPSY       reports that she has never smoked. She has never used smokeless tobacco. She reports that she does not drink alcohol and does not use drugs.  Allergies  Allergen  Reactions   Augmentin [Amoxicillin-Pot Clavulanate] Rash   Amlodipine Other (See Comments)    Ankle edema    Family History  Problem Relation Age of Onset   Heart attack Mother    Other Father        unsure - she was only 67 years old when he died    Prior to Admission medications   Medication Sig Start Date End Date Taking? Authorizing Provider  Acetaminophen (TYLENOL PO) Take 1 tablet by mouth at bedtime.   Yes [provider]  busPIRone (BUSPAR) 5 MG tablet TAKE 1 TABLET (5 MG TOTAL) BY MOUTH 2 (TWO) TIMES DAILY. AS NEEDED FOR ANXIETY. Patient taking differently: Take 5 mg by mouth 2 (two) times daily as needed (anxiety). 09/16/21  Yes Pleas Koch, NP  Carboxymethylcellulose Sodium (REFRESH TEARS OP) Place 1 drop into both eyes in the morning and at bedtime.   Yes [provider]  Cholecalciferol (VITAMIN D3) 10000 units TABS Take 1,000 mg by mouth every morning.   Yes [provider]  GLUCOSAMINE-CHONDROITIN PO Take 1 capsule by mouth every morning.   Yes [provider]  levobunolol (BETAGAN) 0.5 % ophthalmic solution Place 1 drop into both eyes 2 (two) times daily.   Yes [provider]  levothyroxine (SYNTHROID) 25 MCG tablet TAKE 1 TABLET BY MOUTH EVERY MORNING ON AN EMPTY STOMACH WITH WATER ONLY. NO FOOD OR OTHER MEDICATIONS FOR 30 MINUTES. OFFICE VISIT REQUIRED FOR FURTHER REFILLS. Patient taking differently: Take 25 mcg by mouth daily before breakfast.  on an empty stomach with water only.  No food or other medications for 30 minutes. Office visit required for further refills. 05/24/21  Yes Pleas Koch, NP  losartan (COZAAR) 25 MG tablet TAKE 1 TABLET BY MOUTH EVERY DAY FOR BLOOD PRESSURE Patient taking differently: Take 25 mg by mouth every morning. For blood pressure 04/10/21  Yes Pleas Koch, NP  melatonin 1 MG TABS tablet Take 1 mg by mouth at bedtime.   Yes [provider]  memantine (NAMENDA) 5 MG  tablet Take 1 tablet by mouth every morning and 2 tablets by mouth every evening for memory and behavior Patient taking differently: Take 5 mg by mouth 2 (two) times daily.  for memory and behavior 11/28/21  Yes Pleas Koch, NP  sertraline (ZOLOFT) 25 MG tablet TAKE 1 TABLET BY MOUTH AT BEDTIME FOR DEPRESSION Patient taking differently: Take 25 mg by mouth at bedtime.  for depression. 05/11/21  Yes Pleas Koch, NP  nitrofurantoin, macrocrystal-monohydrate, (MACROBID) 100 MG capsule Take 100 mg by mouth 2 (two) times daily. Patient not taking: Reported on 11/28/2021    [provider]    Physical Exam: Vitals:   11/28/21 1845 11/28/21 2045 11/28/21 2145 11/28/21 2230  BP: (!) 130/118 135/70 136/62 (!) 120/59  Pulse: 75 67 68 72  Resp:  '15 17 17  '$ Temp:  TempSrc:      SpO2: 94% 97% 96% 95%    Physical Exam Vitals reviewed.  Constitutional:      General: She is not in acute distress. HENT:     Head: Normocephalic and atraumatic.     Mouth/Throat:     Mouth: Mucous membranes are moist.  Eyes:     Extraocular Movements: Extraocular movements intact.  Cardiovascular:     Rate and Rhythm: Normal rate and regular rhythm.     Pulses: Normal pulses.  Pulmonary:     Effort: Pulmonary effort is normal. No respiratory distress.     Breath sounds: No wheezing or rales.  Abdominal:     General: Bowel sounds are normal. There is no distension.     Palpations: Abdomen is soft.     Tenderness: There is no abdominal tenderness.  Musculoskeletal:        General: No swelling or tenderness.     Cervical back: Normal range of motion.  Skin:    General: Skin is warm and dry.     Comments: Decreased skin turgor  Neurological:     General: No focal deficit present.     Mental Status: She is alert and oriented to person, place, and time.      Labs on Admission: I have personally reviewed following labs and imaging studies  CBC: Recent Labs  Lab 11/22/21 1020  11/28/21 1400  WBC 12.5* 10.7*  NEUTROABS 9.2* 7.4  HGB 12.2 12.2  HCT 36.5 37.2  MCV 93.7 95.4  PLT 241.0 162   Basic Metabolic Panel: Recent Labs  Lab 11/22/21 1020 11/28/21 1400  NA 138 139  K 4.0 4.2  CL 100 104  CO2 29 25  GLUCOSE 81 99  BUN 33* 28*  CREATININE 1.33* 1.54*  CALCIUM 12.0* 12.3*   GFR: Estimated Creatinine Clearance: 14.6 mL/min (A) (by C-G formula based on SCr of 1.54 mg/dL (H)). Liver Function Tests: Recent Labs  Lab 11/28/21 1400  AST 19  ALT 10  ALKPHOS 69  BILITOT 1.0  PROT 6.8  ALBUMIN 3.1*   No results for input(s): "LIPASE", "AMYLASE" in the last 168 hours. No results for input(s): "AMMONIA" in the last 168 hours. Coagulation Profile: No results for input(s): "INR", "PROTIME" in the last 168 hours. Cardiac Enzymes: No results for input(s): "CKTOTAL", "CKMB", "CKMBINDEX", "TROPONINI" in the last 168 hours. BNP (last 3 results) No results for input(s): "PROBNP" in the last 8760 hours. HbA1C: No results for input(s): "HGBA1C" in the last 72 hours. CBG: Recent Labs  Lab 11/28/21 1752  GLUCAP 93   Lipid Profile: No results for input(s): "CHOL", "HDL", "LDLCALC", "TRIG", "CHOLHDL", "LDLDIRECT" in the last 72 hours. Thyroid Function Tests: Recent Labs    11/28/21 1753  TSH 1.470   Anemia Panel: No results for input(s): "VITAMINB12", "FOLATE", "FERRITIN", "TIBC", "IRON", "RETICCTPCT" in the last 72 hours. Urine analysis:    Component Value Date/Time   COLORURINE YELLOW 11/28/2021 2029   APPEARANCEUR HAZY (A) 11/28/2021 2029   LABSPEC 1.013 11/28/2021 2029   PHURINE 5.0 11/28/2021 2029   GLUCOSEU NEGATIVE 11/28/2021 2029   GLUCOSEU NEGATIVE 06/24/2015 0959   HGBUR NEGATIVE 11/28/2021 2029   BILIRUBINUR NEGATIVE 11/28/2021 2029   BILIRUBINUR negative 11/15/2021 1454   BILIRUBINUR Negative 10/27/2021 0831   KETONESUR NEGATIVE 11/28/2021 2029   PROTEINUR NEGATIVE 11/28/2021 2029   UROBILINOGEN 1.0 11/15/2021 1454    UROBILINOGEN 0.2 06/24/2015 0959   NITRITE NEGATIVE 11/28/2021 2029   LEUKOCYTESUR SMALL (A) 11/28/2021  2029    Radiological Exams on Admission: I have personally reviewed images CT CHEST ABDOMEN PELVIS WO CONTRAST  Result Date: 11/28/2021 CLINICAL DATA:  Pneumonia, poor intake, somnolence EXAM: CT CHEST, ABDOMEN AND PELVIS WITHOUT CONTRAST TECHNIQUE: Multidetector CT imaging of the chest, abdomen and pelvis was performed following the standard protocol without IV contrast. RADIATION DOSE REDUCTION: This exam was performed according to the departmental dose-optimization program which includes automated exposure control, adjustment of the mA and/or kV according to patient size and/or use of iterative reconstruction technique. COMPARISON:  05/01/2018 FINDINGS: CT CHEST FINDINGS Cardiovascular: Aortic atherosclerosis. Aortic valve calcifications. Mild cardiomegaly. Three-vessel coronary artery calcifications. No pericardial effusion. Mediastinum/Nodes: No enlarged mediastinal, hilar, or axillary lymph nodes. Large hiatal hernia with intrathoracic position of the gastric body and fundus. Thyroid gland, trachea, and esophagus demonstrate no significant findings. Lungs/Pleura: Examination of the lungs is limited by extensive breath motion artifact. Within this limitation, there is moderate pulmonary fibrosis in a pattern with apical to basal gradient, featuring irregular peripheral interstitial opacity and septal thickening, likely with areas of subpleural bronchiolectasis at the lung bases although poorly evaluated. No pleural effusion or pneumothorax. Musculoskeletal: No chest wall mass or suspicious osseous lesions identified. CT ABDOMEN PELVIS FINDINGS Hepatobiliary: No solid liver abnormality is seen. Distended gallbladder, similar in appearance to prior examination (series 3, image 63). No gallstones, gallbladder wall thickening, or biliary dilatation. Pancreas: Unremarkable. No pancreatic ductal dilatation  or surrounding inflammatory changes. Spleen: Normal in size without significant abnormality. Adrenals/Urinary Tract: Adrenal glands are unremarkable. Kidneys are normal, without renal calculi, solid lesion, or hydronephrosis. Bladder is unremarkable. Stomach/Bowel: Stomach is within normal limits. Appendix is not clearly visualized. No evidence of bowel wall thickening, distention, or inflammatory changes. Descending and sigmoid diverticulosis. Moderate burden of stool throughout the colon and rectum, with stool balls in the rectum measuring up to 6.3 cm. Vascular/Lymphatic: Aortic atherosclerosis. No enlarged abdominal or pelvic lymph nodes. Reproductive: No mass or other abnormality. Other: No abdominal wall hernia or abnormality. No ascites. Musculoskeletal: No acute osseous findings. Gentle levoscoliosis of the thoracolumbar spine. IMPRESSION: 1. No definite acute noncontrast CT findings of the chest, abdomen, or pelvis. 2. Distended gallbladder, similar in appearance to prior examination dated 2019, without calculi, gallbladder wall thickening, pericholecystic fluid, or biliary ductal dilatation. 3. Examination of the lungs is limited by extensive breath motion artifact. Within this limitation, there is moderate pulmonary fibrosis in a pattern with apical to basal gradient, featuring irregular peripheral interstitial opacity and septal thickening, likely with areas of subpleural bronchiolectasis at the lung bases although poorly evaluated. Findings are most consistent with a probable UIP pattern of fibrosis by ATS criteria. 4. Large hiatal hernia with intrathoracic position of the gastric body and fundus. 5. Moderate burden of stool throughout the colon and rectum, with stool balls in the rectum. Correlate for fecal impaction. 6. Coronary artery disease. 7. Aortic valve calcifications. Correlate for echocardiographic evidence of aortic valve dysfunction. Aortic Atherosclerosis (ICD10-I70.0). Electronically  Signed   By: Delanna Ahmadi M.D.   On: 11/28/2021 17:03   CT Cervical Spine Wo Contrast  Result Date: 11/28/2021 CLINICAL DATA:  Neck injury. EXAM: CT HEAD WITHOUT CONTRAST CT CERVICAL SPINE WITHOUT CONTRAST TECHNIQUE: Multidetector CT imaging of the head and cervical spine was performed following the standard protocol without intravenous contrast. Multiplanar CT image reconstructions of the cervical spine were also generated. RADIATION DOSE REDUCTION: This exam was performed according to the departmental dose-optimization program which includes automated exposure control, adjustment of the mA  and/or kV according to patient size and/or use of iterative reconstruction technique. COMPARISON:  April 06, 2021. FINDINGS: CT HEAD FINDINGS Brain: Mild chronic ischemic white matter disease is noted. No mass effect or midline shift is noted. Ventricular size is within normal limits. There is no evidence of mass lesion, hemorrhage or acute infarction. Vascular: No hyperdense vessel or unexpected calcification. Skull: Normal. Negative for fracture or focal lesion. Sinuses/Orbits: No acute finding. Other: None. CT CERVICAL SPINE FINDINGS Alignment: Grade 1 anterolisthesis of C3-4 and C4-5 and C7-T1 is noted secondary to posterior facet joint hypertrophy. Mild grade 1 retrolisthesis of C5-6 is noted secondary to severe degenerative disc disease at this level. Skull base and vertebrae: No acute fracture. No primary bone lesion or focal pathologic process. Soft tissues and spinal canal: No prevertebral fluid or swelling. No visible canal hematoma. Disc levels: Severe degenerative disc disease is noted at C5-6 and C6-7. Upper chest: No acute abnormality is noted. Other: None. IMPRESSION: No acute intracranial abnormality seen. Severe multilevel degenerative disc disease is noted in the cervical spine. No acute abnormality is noted. Electronically Signed   By: Marijo Conception M.D.   On: 11/28/2021 15:44   CT Head Wo  Contrast  Result Date: 11/28/2021 CLINICAL DATA:  Neck injury. EXAM: CT HEAD WITHOUT CONTRAST CT CERVICAL SPINE WITHOUT CONTRAST TECHNIQUE: Multidetector CT imaging of the head and cervical spine was performed following the standard protocol without intravenous contrast. Multiplanar CT image reconstructions of the cervical spine were also generated. RADIATION DOSE REDUCTION: This exam was performed according to the departmental dose-optimization program which includes automated exposure control, adjustment of the mA and/or kV according to patient size and/or use of iterative reconstruction technique. COMPARISON:  April 06, 2021. FINDINGS: CT HEAD FINDINGS Brain: Mild chronic ischemic white matter disease is noted. No mass effect or midline shift is noted. Ventricular size is within normal limits. There is no evidence of mass lesion, hemorrhage or acute infarction. Vascular: No hyperdense vessel or unexpected calcification. Skull: Normal. Negative for fracture or focal lesion. Sinuses/Orbits: No acute finding. Other: None. CT CERVICAL SPINE FINDINGS Alignment: Grade 1 anterolisthesis of C3-4 and C4-5 and C7-T1 is noted secondary to posterior facet joint hypertrophy. Mild grade 1 retrolisthesis of C5-6 is noted secondary to severe degenerative disc disease at this level. Skull base and vertebrae: No acute fracture. No primary bone lesion or focal pathologic process. Soft tissues and spinal canal: No prevertebral fluid or swelling. No visible canal hematoma. Disc levels: Severe degenerative disc disease is noted at C5-6 and C6-7. Upper chest: No acute abnormality is noted. Other: None. IMPRESSION: No acute intracranial abnormality seen. Severe multilevel degenerative disc disease is noted in the cervical spine. No acute abnormality is noted. Electronically Signed   By: Marijo Conception M.D.   On: 11/28/2021 15:44   DG Chest 2 View  Result Date: 11/28/2021 CLINICAL DATA:  Fall EXAM: CHEST - 2 VIEW COMPARISON:   04/15/2021 FINDINGS: Chronic interstitial changes. Suspect some superimposed interstitial opacities. No pleural effusion or pneumothorax. Normal heart size. Hiatal hernia. Osseous demineralization with multiple compression fractures as seen previously. IMPRESSION: Few interstitial opacities superimposed on chronic changes may reflect edema or viral/atypical pneumonia in the appropriate setting. Electronically Signed   By: Macy Mis M.D.   On: 11/28/2021 14:47    EKG: Independently reviewed.  Sinus rhythm, artifact.  Assessment and Plan  Recurrent UTI/history of ESBL E. coli UTI Recently treated for UTI with a 7-day course of Macrobid ending 6/9.  UA done in the ED with signs of infection.  No fever, tachycardia, significant leukocytosis, or lactic acidosis to suggest sepsis.  Recent urine culture growing ESBL E. coli (sensitive to cefepime). -Continue cefepime -Urine culture pending -Monitor WBC count  AKI on CKD stage IIIa Likely prerenal from dehydration given poor oral intake. BUN 28, creatinine 1.5 (baseline 1.0-1.2). -Continue IV fluid hydration -Avoid nephrotoxic agents/contrast -Hold home losartan -Monitor renal function closely  Acute metabolic encephalopathy Patient has dementia and family reported increased somnolence.  At present, she is awake and alert but not able to give any history.  Oriented to self and partially oriented to place.  No focal neurodeficit on exam and CT head negative for acute finding.  TSH normal.  No fever or meningeal signs.  Suspect encephalopathy is due to UTI and dehydration. -Continue antibiotic for UTI -Continue IV fluid hydration  Moderate hypercalcemia Calcium 13.0 when corrected for hypoalbuminemia.  CT chest/abdomen/pelvis without findings concerning for malignancy.  TSH normal.  Etiology could possibly be volume depletion. ?Vitamin D toxicity, takes supplement at home. -Continue IV fluid hydration -Check PTH -Hold vitamin D supplement and  check vitamin D level -Monitor calcium level closely  Physical deconditioning/poor appetite/failure to thrive -PT/OT eval, fall precautions -Dietitian consulted -Palliative care consulted  UIP pattern of pulmonary fibrosis Seen on CT.  No hypoxia or respiratory distress at this time.  Large hiatal hernia CT showing large hiatal hernia with intrathoracic position of the gastric body and fundus. Unknown whether patient is symptomatic with GERD, likely not a surgical candidate given age and comorbidities.  Constipation CT showing moderate burden of stool throughout the colon and rectum with small balls in the rectum.  Hypercalcemia likely contributing. -Continue IV fluid hydration -Dulcolax suppository x1 -MiraLAX as needed -May need enema if still constipated  Depression -Continue Zoloft  Glaucoma -Continue levobunolol eyedrops  Hypertension Blood pressure stable. -Hold losartan given AKI.  Hypothyroidism TSH normal. -Continue Synthroid  Dementia -Continue Namenda -Delirium precautions  Coronary artery disease Aortic valve calcifications Seen on CT.  Please discuss with family in the morning to determine if they wish to pursue further work-up with an echocardiogram.  DVT prophylaxis: SQ Heparin Code Status: DNR Family Communication: No family available at this time. Level of care: Med-Surg Admission status: It is my clinical opinion that admission to INPATIENT is reasonable and necessary because of the expectation that this patient will require hospital care that crosses at least 2 midnights to treat this condition based on the medical complexity of the problems presented.  Given the aforementioned information, the predictability of an adverse outcome is felt to be significant.   Shela Leff MD [facility]  If 7PM-7AM, please contact night-coverage [email or web address]  11/28/2021, 11:29 PM

## 2021-11-28 NOTE — Progress Notes (Signed)
[facility] ED ATR02 [facility]) Hospital Liaison note:  This patient is currently enrolled in [facility] outpatient-based Palliative Care. Will continue to follow for disposition.  Please call with any outpatient palliative questions or concerns.  Thank you, Lorelee Market, LPN [facility] Liaison [phone]

## 2021-11-28 NOTE — ED Provider Notes (Signed)
  Physical Exam  BP 135/70   Pulse 67   Temp 98.1 F (36.7 C) (Oral)   Resp 15   SpO2 97%   Physical Exam  Procedures  Procedures  ED Course / MDM    Medical Decision Making Care assumed at 4 PM.  Patient is here with weakness and possible recurrent UTIs.  Initial chest x-ray showed possible pneumonia so CT chest is ordered.  Signed out pending urinalysis and CT chest and likely admission  9:17 PM Patient is a difficult IV stick.  I was able to place an IV.  In and out cath performed by the nurse and urinalysis showed UTI.  Patient's recent urine culture showed E. coli that is resistant to Rocephin but sensitive to cefepime.  I ordered IV cefepime.  I also reviewed her CT scan independently.  Patient has no obvious pneumonia but is constipated.  I ordered enema.  Hospitalist to admit.  Patient is DNR.  Angiocath insertion Performed by: Wandra Arthurs  Consent: Verbal consent obtained. Risks and benefits: risks, benefits and alternatives were discussed Time out: Immediately prior to procedure a "time out" was called to verify the correct patient, procedure, equipment, support staff and site/side marked as required.  Preparation: Patient was prepped and draped in the usual sterile fashion.  Vein Location: R brachial  Ultrasound Guided  Gauge: 20 long   Normal blood return and flush without difficulty Patient tolerance: Patient tolerated the procedure well with no immediate complications.     .   Problems Addressed: AKI (acute kidney injury) ([place]): acute illness or injury Failure to thrive (child): acute illness or injury Urinary tract infection without hematuria, site unspecified: acute illness or injury  Amount and/or Complexity of Data Reviewed Labs: ordered. Decision-making details documented in ED Course. Radiology: ordered and independent interpretation performed. Decision-making details documented in ED Course.  Risk OTC drugs. Prescription drug  management.          Drenda Freeze, MD 11/28/21 2119

## 2021-11-28 NOTE — ED Provider Notes (Signed)
[facility] DEPARTMENT Provider Note   CSN: [id number] Arrival date & time: 11/28/21  1316     History  No chief complaint on file.   Nicole Downs is a 88 y.o. female.  Patient brought in by her daughter.  Patient lives with her daughter.  Patient walks with a walker.  Patient had a fall Saturday and today she tripping over the rug family wanted her evaluated to make sure thing was okay she has a history of dementia.  She not been eating or drinking very well sleeping a lot more.  She had trouble with chronic recurrent urinary tract infections.  Patient is alert and oriented to person and place.  No fever.  No nausea no vomiting.  Patient had just completed a course of Macrobid 7-day course on June 9.  Patient is a DNR they are working on starting palliative care the patient's daughter is her power of attorney.  Patient denies any complaints from the fall.  Patient is able to ambulate with a walker.  Past medical history also significant for hypothyroidism hypertension basal cell carcinoma squamous cell carcinoma past surgical history significant for breast biopsy patient never smoked.       Home Medications Prior to Admission medications   Medication Sig Start Date End Date Taking? Authorizing Provider  Acetaminophen (TYLENOL PO) Take 1 tablet by mouth at bedtime.   Yes [provider]  busPIRone (BUSPAR) 5 MG tablet TAKE 1 TABLET (5 MG TOTAL) BY MOUTH 2 (TWO) TIMES DAILY. AS NEEDED FOR ANXIETY. Patient taking differently: Take 5 mg by mouth 2 (two) times daily as needed (anxiety). 09/16/21  Yes Pleas Koch, NP  Carboxymethylcellulose Sodium (REFRESH TEARS OP) Place 1 drop into both eyes in the morning and at bedtime.   Yes [provider]  Cholecalciferol (VITAMIN D3) 10000 units TABS Take 1,000 mg by mouth every morning.   Yes [provider]  GLUCOSAMINE-CHONDROITIN PO Take 1 capsule by mouth every morning.   Yes  [provider]  levobunolol (BETAGAN) 0.5 % ophthalmic solution Place 1 drop into both eyes 2 (two) times daily.   Yes [provider]  levothyroxine (SYNTHROID) 25 MCG tablet TAKE 1 TABLET BY MOUTH EVERY MORNING ON AN EMPTY STOMACH WITH WATER ONLY. NO FOOD OR OTHER MEDICATIONS FOR 30 MINUTES. OFFICE VISIT REQUIRED FOR FURTHER REFILLS. Patient taking differently: Take 25 mcg by mouth daily before breakfast.  on an empty stomach with water only.  No food or other medications for 30 minutes. Office visit required for further refills. 05/24/21  Yes Pleas Koch, NP  losartan (COZAAR) 25 MG tablet TAKE 1 TABLET BY MOUTH EVERY DAY FOR BLOOD PRESSURE Patient taking differently: Take 25 mg by mouth every morning. For blood pressure 04/10/21  Yes Pleas Koch, NP  melatonin 1 MG TABS tablet Take 1 mg by mouth at bedtime.   Yes [provider]  memantine (NAMENDA) 5 MG tablet Take 1 tablet by mouth every morning and 2 tablets by mouth every evening for memory and behavior Patient taking differently: Take 5 mg by mouth 2 (two) times daily.  for memory and behavior 11/28/21  Yes Pleas Koch, NP  sertraline (ZOLOFT) 25 MG tablet TAKE 1 TABLET BY MOUTH AT BEDTIME FOR DEPRESSION Patient taking differently: Take 25 mg by mouth at bedtime.  for depression. 05/11/21  Yes Pleas Koch, NP  nitrofurantoin, macrocrystal-monohydrate, (MACROBID) 100 MG capsule Take 100 mg by mouth 2 (two) times  daily. Patient not taking: Reported on 11/28/2021    [provider]      Allergies    Augmentin [amoxicillin-pot clavulanate] and Amlodipine    Review of Systems   Review of Systems  Constitutional:  Positive for activity change, appetite change and fatigue. Negative for chills and fever.  HENT:  Negative for ear pain and sore throat.   Eyes:  Negative for pain and visual disturbance.  Respiratory:  Negative for cough and shortness of breath.   Cardiovascular:   Negative for chest pain and palpitations.  Gastrointestinal:  Negative for abdominal pain and vomiting.  Genitourinary:  Negative for dysuria and hematuria.  Musculoskeletal:  Negative for arthralgias and back pain.  Skin:  Negative for color change and rash.  Neurological:  Negative for seizures and syncope.  All other systems reviewed and are negative.   Physical Exam Updated Vital Signs BP 135/66   Pulse 68   Temp 98.1 F (36.7 C) (Oral)   Resp 16   SpO2 99%  Physical Exam Vitals and nursing note reviewed.  Constitutional:      General: She is not in acute distress.    Appearance: Normal appearance. She is well-developed.     Comments: Patient very frail.  HENT:     Head: Normocephalic and atraumatic.     Mouth/Throat:     Mouth: Mucous membranes are dry.  Eyes:     Extraocular Movements: Extraocular movements intact.     Conjunctiva/sclera: Conjunctivae normal.     Pupils: Pupils are equal, round, and reactive to light.  Cardiovascular:     Rate and Rhythm: Normal rate and regular rhythm.     Heart sounds: No murmur heard. Pulmonary:     Effort: Pulmonary effort is normal. No respiratory distress.     Breath sounds: Normal breath sounds.  Abdominal:     Palpations: Abdomen is soft.     Tenderness: There is no abdominal tenderness.  Musculoskeletal:        General: No swelling, tenderness or deformity.     Cervical back: Neck supple. No tenderness.     Comments: No pain with movement of the upper extremities no pain with movement of the lower extremities prickly no pain at the hip no deformity.  Skin:    General: Skin is warm and dry.     Capillary Refill: Capillary refill takes less than 2 seconds.  Neurological:     General: No focal deficit present.     Mental Status: She is alert.     Cranial Nerves: No cranial nerve deficit.     Sensory: No sensory deficit.     Motor: No weakness.     Comments: Oriented to person and place  Psychiatric:        Mood and  Affect: Mood normal.     ED Results / Procedures / Treatments   Labs (all labs ordered are listed, but only abnormal results are displayed) Labs Reviewed  COMPREHENSIVE METABOLIC PANEL - Abnormal; Notable for the following components:      Result Value   BUN 28 (*)    Creatinine, Ser 1.54 (*)    Calcium 12.3 (*)    Albumin 3.1 (*)    GFR, Estimated 31 (*)    All other components within normal limits  CBC WITH DIFFERENTIAL/PLATELET - Abnormal; Notable for the following components:   WBC 10.7 (*)    All other components within normal limits  LACTIC ACID, PLASMA  URINALYSIS, ROUTINE W REFLEX  MICROSCOPIC  TSH  CBG MONITORING, ED  CBG MONITORING, ED    EKG EKG Interpretation  Date/Time:  Monday November 28 2021 14:02:48 EDT Ventricular Rate:  69 PR Interval:  162 QRS Duration: 76 QT Interval:  364 QTC Calculation: 390 R Axis:   -18 Text Interpretation: Normal sinus rhythm Normal ECG When compared with ECG of 05-Apr-2021 20:44, PREVIOUS ECG IS PRESENT Artifact Confirmed by Fredia Sorrow [phone]) on 11/28/2021 3:55:19 PM  Radiology CT Cervical Spine Wo Contrast  Result Date: 11/28/2021 CLINICAL DATA:  Neck injury. EXAM: CT HEAD WITHOUT CONTRAST CT CERVICAL SPINE WITHOUT CONTRAST TECHNIQUE: Multidetector CT imaging of the head and cervical spine was performed following the standard protocol without intravenous contrast. Multiplanar CT image reconstructions of the cervical spine were also generated. RADIATION DOSE REDUCTION: This exam was performed according to the departmental dose-optimization program which includes automated exposure control, adjustment of the mA and/or kV according to patient size and/or use of iterative reconstruction technique. COMPARISON:  April 06, 2021. FINDINGS: CT HEAD FINDINGS Brain: Mild chronic ischemic white matter disease is noted. No mass effect or midline shift is noted. Ventricular size is within normal limits. There is no evidence of mass lesion,  hemorrhage or acute infarction. Vascular: No hyperdense vessel or unexpected calcification. Skull: Normal. Negative for fracture or focal lesion. Sinuses/Orbits: No acute finding. Other: None. CT CERVICAL SPINE FINDINGS Alignment: Grade 1 anterolisthesis of C3-4 and C4-5 and C7-T1 is noted secondary to posterior facet joint hypertrophy. Mild grade 1 retrolisthesis of C5-6 is noted secondary to severe degenerative disc disease at this level. Skull base and vertebrae: No acute fracture. No primary bone lesion or focal pathologic process. Soft tissues and spinal canal: No prevertebral fluid or swelling. No visible canal hematoma. Disc levels: Severe degenerative disc disease is noted at C5-6 and C6-7. Upper chest: No acute abnormality is noted. Other: None. IMPRESSION: No acute intracranial abnormality seen. Severe multilevel degenerative disc disease is noted in the cervical spine. No acute abnormality is noted. Electronically Signed   By: Marijo Conception M.D.   On: 11/28/2021 15:44   CT Head Wo Contrast  Result Date: 11/28/2021 CLINICAL DATA:  Neck injury. EXAM: CT HEAD WITHOUT CONTRAST CT CERVICAL SPINE WITHOUT CONTRAST TECHNIQUE: Multidetector CT imaging of the head and cervical spine was performed following the standard protocol without intravenous contrast. Multiplanar CT image reconstructions of the cervical spine were also generated. RADIATION DOSE REDUCTION: This exam was performed according to the departmental dose-optimization program which includes automated exposure control, adjustment of the mA and/or kV according to patient size and/or use of iterative reconstruction technique. COMPARISON:  April 06, 2021. FINDINGS: CT HEAD FINDINGS Brain: Mild chronic ischemic white matter disease is noted. No mass effect or midline shift is noted. Ventricular size is within normal limits. There is no evidence of mass lesion, hemorrhage or acute infarction. Vascular: No hyperdense vessel or unexpected  calcification. Skull: Normal. Negative for fracture or focal lesion. Sinuses/Orbits: No acute finding. Other: None. CT CERVICAL SPINE FINDINGS Alignment: Grade 1 anterolisthesis of C3-4 and C4-5 and C7-T1 is noted secondary to posterior facet joint hypertrophy. Mild grade 1 retrolisthesis of C5-6 is noted secondary to severe degenerative disc disease at this level. Skull base and vertebrae: No acute fracture. No primary bone lesion or focal pathologic process. Soft tissues and spinal canal: No prevertebral fluid or swelling. No visible canal hematoma. Disc levels: Severe degenerative disc disease is noted at C5-6 and C6-7. Upper chest: No acute abnormality is noted. Other:  None. IMPRESSION: No acute intracranial abnormality seen. Severe multilevel degenerative disc disease is noted in the cervical spine. No acute abnormality is noted. Electronically Signed   By: Marijo Conception M.D.   On: 11/28/2021 15:44   DG Chest 2 View  Result Date: 11/28/2021 CLINICAL DATA:  Fall EXAM: CHEST - 2 VIEW COMPARISON:  04/15/2021 FINDINGS: Chronic interstitial changes. Suspect some superimposed interstitial opacities. No pleural effusion or pneumothorax. Normal heart size. Hiatal hernia. Osseous demineralization with multiple compression fractures as seen previously. IMPRESSION: Few interstitial opacities superimposed on chronic changes may reflect edema or viral/atypical pneumonia in the appropriate setting. Electronically Signed   By: Macy Mis M.D.   On: 11/28/2021 14:47    Procedures Procedures    Medications Ordered in ED Medications  0.9 %  sodium chloride infusion (has no administration in time range)  sodium chloride 0.9 % bolus 500 mL (has no administration in time range)    ED Course/ Medical Decision Making/ A&P                           Medical Decision Making Amount and/or Complexity of Data Reviewed Labs: ordered. Radiology: ordered.  Risk Prescription drug management.   Patient complete  metabolic panel sodium is normal potassium is normal however GFR is 31 fairly significant change from December.  And does change even from June 6.  CBC mild leukocytosis white count of 10.7 hemoglobin 12.2 platelets are fine lactic acid not elevated.  CT cervical spine without any acute injuries.  CT head without any acute injuries.  Chest x-ray raises some concerns either for viral atypical pneumonia or perhaps edema.  Based on this patient will get CT chest without to further evaluate this.  Since she does have a mild leukocytosis.  Also we need to get urine on her since she has had frequent urinary tract infections.  IV fluids have been ordered.  With a 500 cc bolus and then maintenance.  Most likely based on her creatinine changes and her GFR changes showing signs of acute kidney injury patient needs admission for some IV hydration.  Also family does want her treated for pneumonia if she does show signs of pneumonia.  And I think they also want the urine treated if she shows signs of urinary tract infection.  Overall patient probably will require admission for short stay.  Patient is a DNR. Final Clinical Impression(s) / ED Diagnoses Final diagnoses:  AKI (acute kidney injury) ([place])  Failure to thrive (child)    Rx / DC Orders ED Discharge Orders     None         Fredia Sorrow, MD 11/28/21 1627

## 2021-11-28 NOTE — ED Triage Notes (Signed)
Pt lives at home with family. Pt had a fall Saturday and today, tripping over rug. Family wanted her evaluated to make sure everything was okay. Hx dementia. Family reports failure to thrive not eating as much and sleeping more. Pt alert/oriented to person/place.

## 2021-11-28 NOTE — ED Notes (Signed)
[facility] to collect urine at

## 2021-11-29 DIAGNOSIS — F03C0 Unspecified dementia, severe, without behavioral disturbance, psychotic disturbance, mood disturbance, and anxiety: Secondary | ICD-10-CM | POA: Diagnosis not present

## 2021-11-29 DIAGNOSIS — K449 Diaphragmatic hernia without obstruction or gangrene: Secondary | ICD-10-CM

## 2021-11-29 DIAGNOSIS — Z1624 Resistance to multiple antibiotics: Secondary | ICD-10-CM | POA: Diagnosis present

## 2021-11-29 DIAGNOSIS — R5381 Other malaise: Secondary | ICD-10-CM | POA: Diagnosis not present

## 2021-11-29 DIAGNOSIS — N179 Acute kidney failure, unspecified: Secondary | ICD-10-CM | POA: Diagnosis present

## 2021-11-29 DIAGNOSIS — Z7401 Bed confinement status: Secondary | ICD-10-CM | POA: Diagnosis not present

## 2021-11-29 DIAGNOSIS — L89151 Pressure ulcer of sacral region, stage 1: Secondary | ICD-10-CM | POA: Diagnosis present

## 2021-11-29 DIAGNOSIS — H409 Unspecified glaucoma: Secondary | ICD-10-CM | POA: Diagnosis present

## 2021-11-29 DIAGNOSIS — Z66 Do not resuscitate: Secondary | ICD-10-CM | POA: Diagnosis present

## 2021-11-29 DIAGNOSIS — I2510 Atherosclerotic heart disease of native coronary artery without angina pectoris: Secondary | ICD-10-CM | POA: Diagnosis present

## 2021-11-29 DIAGNOSIS — R627 Adult failure to thrive: Secondary | ICD-10-CM

## 2021-11-29 DIAGNOSIS — L8990 Pressure ulcer of unspecified site, unspecified stage: Secondary | ICD-10-CM | POA: Diagnosis present

## 2021-11-29 DIAGNOSIS — G9341 Metabolic encephalopathy: Secondary | ICD-10-CM

## 2021-11-29 DIAGNOSIS — N390 Urinary tract infection, site not specified: Secondary | ICD-10-CM | POA: Diagnosis present

## 2021-11-29 DIAGNOSIS — F02C11 Dementia in other diseases classified elsewhere, severe, with agitation: Secondary | ICD-10-CM | POA: Diagnosis not present

## 2021-11-29 DIAGNOSIS — Z6821 Body mass index (BMI) 21.0-21.9, adult: Secondary | ICD-10-CM | POA: Diagnosis not present

## 2021-11-29 DIAGNOSIS — E8809 Other disorders of plasma-protein metabolism, not elsewhere classified: Secondary | ICD-10-CM | POA: Diagnosis present

## 2021-11-29 DIAGNOSIS — Z87440 Personal history of urinary (tract) infections: Secondary | ICD-10-CM | POA: Diagnosis not present

## 2021-11-29 DIAGNOSIS — E440 Moderate protein-calorie malnutrition: Secondary | ICD-10-CM | POA: Diagnosis present

## 2021-11-29 DIAGNOSIS — J439 Emphysema, unspecified: Secondary | ICD-10-CM | POA: Diagnosis present

## 2021-11-29 DIAGNOSIS — E039 Hypothyroidism, unspecified: Secondary | ICD-10-CM | POA: Diagnosis present

## 2021-11-29 DIAGNOSIS — G309 Alzheimer's disease, unspecified: Secondary | ICD-10-CM | POA: Diagnosis not present

## 2021-11-29 DIAGNOSIS — Z1612 Extended spectrum beta lactamase (ESBL) resistance: Secondary | ICD-10-CM | POA: Diagnosis present

## 2021-11-29 DIAGNOSIS — Z85828 Personal history of other malignant neoplasm of skin: Secondary | ICD-10-CM | POA: Diagnosis not present

## 2021-11-29 DIAGNOSIS — I129 Hypertensive chronic kidney disease with stage 1 through stage 4 chronic kidney disease, or unspecified chronic kidney disease: Secondary | ICD-10-CM | POA: Diagnosis present

## 2021-11-29 DIAGNOSIS — R531 Weakness: Secondary | ICD-10-CM | POA: Diagnosis not present

## 2021-11-29 DIAGNOSIS — F0393 Unspecified dementia, unspecified severity, with mood disturbance: Secondary | ICD-10-CM | POA: Diagnosis present

## 2021-11-29 DIAGNOSIS — J8410 Pulmonary fibrosis, unspecified: Secondary | ICD-10-CM | POA: Diagnosis present

## 2021-11-29 DIAGNOSIS — Z515 Encounter for palliative care: Secondary | ICD-10-CM | POA: Diagnosis not present

## 2021-11-29 DIAGNOSIS — N1831 Chronic kidney disease, stage 3a: Secondary | ICD-10-CM | POA: Diagnosis present

## 2021-11-29 DIAGNOSIS — J84112 Idiopathic pulmonary fibrosis: Secondary | ICD-10-CM | POA: Diagnosis present

## 2021-11-29 LAB — COMPREHENSIVE METABOLIC PANEL
ALT: 11 U/L (ref 0–44)
AST: 17 U/L (ref 15–41)
Albumin: 3.1 g/dL — ABNORMAL LOW (ref 3.5–5.0)
Alkaline Phosphatase: 62 U/L (ref 38–126)
Anion gap: 10 (ref 5–15)
BUN: 25 mg/dL — ABNORMAL HIGH (ref 8–23)
CO2: 27 mmol/L (ref 22–32)
Calcium: 11.5 mg/dL — ABNORMAL HIGH (ref 8.9–10.3)
Chloride: 101 mmol/L (ref 98–111)
Creatinine, Ser: 1.52 mg/dL — ABNORMAL HIGH (ref 0.44–1.00)
GFR, Estimated: 31 mL/min — ABNORMAL LOW (ref >60)
Glucose, Bld: 97 mg/dL (ref 70–99)
Potassium: 3.3 mmol/L — ABNORMAL LOW (ref 3.5–5.1)
Sodium: 138 mmol/L (ref 135–145)
Total Bilirubin: 1.3 mg/dL — ABNORMAL HIGH (ref 0.3–1.2)
Total Protein: 6.9 g/dL (ref 6.5–8.1)

## 2021-11-29 LAB — CBC
HCT: 35.9 % — ABNORMAL LOW (ref 36.0–46.0)
Hemoglobin: 12.2 g/dL (ref 12.0–15.0)
MCH: 32.1 pg (ref 26.0–34.0)
MCHC: 34 g/dL (ref 30.0–36.0)
MCV: 94.5 fL (ref 80.0–100.0)
Platelets: 268 10*3/uL (ref 150–400)
RBC: 3.8 MIL/uL — ABNORMAL LOW (ref 3.87–5.11)
RDW: 12.8 % (ref 11.5–15.5)
WBC: 13.7 10*3/uL — ABNORMAL HIGH (ref 4.0–10.5)
nRBC: 0 % (ref 0.0–0.2)

## 2021-11-29 LAB — VITAMIN D 25 HYDROXY (VIT D DEFICIENCY, FRACTURES): Vit D, 25-Hydroxy: 110.26 ng/mL — ABNORMAL HIGH (ref 30–100)

## 2021-11-29 MED ORDER — ADULT MULTIVITAMIN W/MINERALS CH
1.0000 | ORAL_TABLET | Freq: Every day | ORAL | Status: DC
  Administered 2021-11-29 – 2021-11-30 (×2): 1 via ORAL
  Filled 2021-11-29 (×2): qty 1

## 2021-11-29 MED ORDER — POTASSIUM CHLORIDE CRYS ER 20 MEQ PO TBCR
20.0000 meq | EXTENDED_RELEASE_TABLET | Freq: Two times a day (BID) | ORAL | Status: AC
  Administered 2021-11-29 (×2): 20 meq via ORAL
  Filled 2021-11-29 (×2): qty 1

## 2021-11-29 MED ORDER — SODIUM CHLORIDE 0.9 % IV SOLN
1.0000 g | INTRAVENOUS | Status: DC
Start: 2021-11-29 — End: 2021-11-29
  Filled 2021-11-29: qty 10

## 2021-11-29 MED ORDER — HEPARIN SODIUM (PORCINE) 5000 UNIT/ML IJ SOLN
5000.0000 [IU] | Freq: Three times a day (TID) | INTRAMUSCULAR | Status: DC
  Administered 2021-11-29 – 2021-11-30 (×4): 5000 [IU] via SUBCUTANEOUS
  Filled 2021-11-29 (×4): qty 1

## 2021-11-29 MED ORDER — SERTRALINE HCL 50 MG PO TABS
25.0000 mg | ORAL_TABLET | Freq: Every day | ORAL | Status: DC
  Administered 2021-11-29 – 2021-11-30 (×3): 25 mg via ORAL
  Filled 2021-11-29 (×3): qty 1

## 2021-11-29 MED ORDER — MEMANTINE HCL 10 MG PO TABS
5.0000 mg | ORAL_TABLET | Freq: Two times a day (BID) | ORAL | Status: DC
  Administered 2021-11-29 – 2021-11-30 (×4): 5 mg via ORAL
  Filled 2021-11-29 (×4): qty 1

## 2021-11-29 MED ORDER — LEVOBUNOLOL HCL 0.5 % OP SOLN
1.0000 [drp] | Freq: Two times a day (BID) | OPHTHALMIC | Status: DC
Start: 2021-11-29 — End: 2021-12-01
  Administered 2021-11-29 – 2021-11-30 (×4): 1 [drp] via OPHTHALMIC
  Filled 2021-11-29: qty 5

## 2021-11-29 MED ORDER — LEVOTHYROXINE SODIUM 25 MCG PO TABS
25.0000 ug | ORAL_TABLET | Freq: Every day | ORAL | Status: DC
  Administered 2021-11-29 – 2021-11-30 (×2): 25 ug via ORAL
  Filled 2021-11-29 (×3): qty 1

## 2021-11-29 MED ORDER — MEROPENEM 500 MG IV SOLR
500.0000 mg | Freq: Two times a day (BID) | INTRAVENOUS | Status: DC
  Administered 2021-11-29: 500 mg via INTRAVENOUS
  Filled 2021-11-29 (×4): qty 10

## 2021-11-29 MED ORDER — POLYETHYLENE GLYCOL 3350 17 G PO PACK
17.0000 g | PACK | Freq: Every day | ORAL | Status: DC | PRN
Start: 2021-11-29 — End: 2021-12-01

## 2021-11-29 MED ORDER — ACETAMINOPHEN 650 MG RE SUPP: 650.0000 mg | Freq: Four times a day (QID) | RECTAL | Status: DC | PRN

## 2021-11-29 MED ORDER — ACETAMINOPHEN 325 MG PO TABS: 650.0000 mg | ORAL_TABLET | Freq: Four times a day (QID) | ORAL | Status: DC | PRN

## 2021-11-29 MED ORDER — BISACODYL 10 MG RE SUPP
10.0000 mg | Freq: Once | RECTAL | Status: AC
  Administered 2021-11-29: 10 mg via RECTAL
  Filled 2021-11-29: qty 1

## 2021-11-29 MED ORDER — HEPARIN SODIUM (PORCINE) 5000 UNIT/ML IJ SOLN: 5000.0000 [IU] | Freq: Three times a day (TID) | INTRAMUSCULAR | Status: DC

## 2021-11-29 NOTE — Progress Notes (Signed)
PROGRESS NOTE    Nicole Downs  TKW:[id number] DOB: 11-Oct-1923 DOA: 11/28/2021 PCP: Pleas Koch, NP    Brief Narrative:  88 year old with history of dementia, chronic tremors, recurrent UTI, history of ESBL UTI, glaucoma, hypertension, hypothyroidism, CKD stage III AAA and history of skin cancer presented from home where she lives with her daughter with 2 recent falls and unusual somnolence.  Recently diagnosed with UTI and completed Macrobid on 6/9.  Her urine cultures are ESBL.  In the emergency room hemodynamically stable, urine with negative nitrite, small leukocytes, 21-50 WBCs and many bacteria.  Trauma series negative.  CT scan chest abdomen pelvis essentially normal with chronic findings.  Admitted with altered mental status secondary to UTI from ESBL.   Assessment & Plan:   Recurrent UTI/history of ESBL E. coli: Currently nontoxic.  Probably colonized, however with acute metabolic encephalopathy will treat with meropenem, recheck urine cultures today.  No evidence of urinary retention.  AKI on CKD stage IIIa: Likely prerenal.  Receiving isotonic fluid and already normalizing.  Hold home losartan.  Monitor renal functions.  Acute metabolic encephalopathy: Increased somnolence.  No neurodeficits.  Continue antibiotics for UTI.  Continue IV hydration.  Hypercalcemia as below.  Moderate hypercalcemia: Calcium 13 when corrected for hypoalbuminemia.  CT scans with no evidence of malignancies.  TSH normal. Vitamin D is more than 100, probably related to vitaminosis. Discontinue all vitamin D supplements.  Continue hydration today.  Recheck electrolytes tomorrow morning. I called and discussed, updated patient's daughter about not taking any vitamin D at home.  Physical deconditioning/failure to thrive: PT OT and palliative care.  Lives at home with daughter who is present 24/7.  Called and updated patient's daughter.  Currently remains on meropenem.  Patient is not obviously  toxic.  Will continue antibiotics until final cultures.  If blood cultures are negative and medically stable, she can be treated with fosfomycin for 1 dose tomorrow.   DVT prophylaxis: heparin injection 5,000 Units Start: 11/29/21 0600   Code Status: DNR Family Communication: Daughter on the phone Disposition Plan: Status is: Inpatient Remains inpatient appropriate because: Multidrug-resistant UTI needing IV antibiotics, hypercalcemia needing IV fluids     Consultants:  None  Procedures:  None  Antimicrobials:  Cefepime 6/12 Meropenem 6/13-----   Subjective: Patient seen and examined.  Remains comfortable.  Pleasant and confused.  Remains afebrile.  Objective: Vitals:   11/29/21 0600 11/29/21 0619 11/29/21 0620 11/29/21 0935  BP:  (!) 169/73 (!) 169/73 (!) 158/71  Pulse: 67 72  74  Resp:    18  Temp: 97.7 F (36.5 C) 97.7 F (36.5 C)  98 F (36.7 C)  TempSrc: Oral Oral  Oral  SpO2: 96% 97%  96%  Weight:        Intake/Output Summary (Last 24 hours) at 11/29/2021 1528 Last data filed at 11/29/2021 1200 Gross per 24 hour  Intake 1141.07 ml  Output 0 ml  Net 1141.07 ml   Filed Weights   11/29/21 0122  Weight: 49 kg    Examination:  General: Looks fairly comfortable.  Frail and debilitated.  Appropriate for 41 year old with cognitive delay. Cardiovascular: S1-S2 normal.  Regular rate rhythm.  No added sounds. Respiratory: Bilateral clear. Gastrointestinal: Soft.  Nontender.  Bowel sound present. Ext: No swelling or edema.  No redness or cyanosis.     Data Reviewed: I have personally reviewed following labs and imaging studies  CBC: Recent Labs  Lab 11/28/21 1400 11/29/21 0427  WBC 10.7* 13.7*  NEUTROABS  7.4  --   HGB 12.2 12.2  HCT 37.2 35.9*  MCV 95.4 94.5  PLT 259 735   Basic Metabolic Panel: Recent Labs  Lab 11/28/21 1400 11/29/21 0427  NA 139 138  K 4.2 3.3*  CL 104 101  CO2 25 27  GLUCOSE 99 97  BUN 28* 25*  CREATININE 1.54*  1.52*  CALCIUM 12.3* 11.5*   GFR: Estimated Creatinine Clearance: 14.8 mL/min (A) (by C-G formula based on SCr of 1.52 mg/dL (H)). Liver Function Tests: Recent Labs  Lab 11/28/21 1400 11/29/21 0427  AST 19 17  ALT 10 11  ALKPHOS 69 62  BILITOT 1.0 1.3*  PROT 6.8 6.9  ALBUMIN 3.1* 3.1*   No results for input(s): "LIPASE", "AMYLASE" in the last 168 hours. No results for input(s): "AMMONIA" in the last 168 hours. Coagulation Profile: No results for input(s): "INR", "PROTIME" in the last 168 hours. Cardiac Enzymes: No results for input(s): "CKTOTAL", "CKMB", "CKMBINDEX", "TROPONINI" in the last 168 hours. BNP (last 3 results) No results for input(s): "PROBNP" in the last 8760 hours. HbA1C: No results for input(s): "HGBA1C" in the last 72 hours. CBG: Recent Labs  Lab 11/28/21 1752  GLUCAP 93   Lipid Profile: No results for input(s): "CHOL", "HDL", "LDLCALC", "TRIG", "CHOLHDL", "LDLDIRECT" in the last 72 hours. Thyroid Function Tests: Recent Labs    11/28/21 1753  TSH 1.470   Anemia Panel: No results for input(s): "VITAMINB12", "FOLATE", "FERRITIN", "TIBC", "IRON", "RETICCTPCT" in the last 72 hours. Sepsis Labs: Recent Labs  Lab 11/28/21 1352  LATICACIDVEN 0.8    No results found for this or any previous visit (from the past 240 hour(s)).       Radiology Studies: CT CHEST ABDOMEN PELVIS WO CONTRAST  Result Date: 11/28/2021 CLINICAL DATA:  Pneumonia, poor intake, somnolence EXAM: CT CHEST, ABDOMEN AND PELVIS WITHOUT CONTRAST TECHNIQUE: Multidetector CT imaging of the chest, abdomen and pelvis was performed following the standard protocol without IV contrast. RADIATION DOSE REDUCTION: This exam was performed according to the departmental dose-optimization program which includes automated exposure control, adjustment of the mA and/or kV according to patient size and/or use of iterative reconstruction technique. COMPARISON:  05/01/2018 FINDINGS: CT CHEST FINDINGS  Cardiovascular: Aortic atherosclerosis. Aortic valve calcifications. Mild cardiomegaly. Three-vessel coronary artery calcifications. No pericardial effusion. Mediastinum/Nodes: No enlarged mediastinal, hilar, or axillary lymph nodes. Large hiatal hernia with intrathoracic position of the gastric body and fundus. Thyroid gland, trachea, and esophagus demonstrate no significant findings. Lungs/Pleura: Examination of the lungs is limited by extensive breath motion artifact. Within this limitation, there is moderate pulmonary fibrosis in a pattern with apical to basal gradient, featuring irregular peripheral interstitial opacity and septal thickening, likely with areas of subpleural bronchiolectasis at the lung bases although poorly evaluated. No pleural effusion or pneumothorax. Musculoskeletal: No chest wall mass or suspicious osseous lesions identified. CT ABDOMEN PELVIS FINDINGS Hepatobiliary: No solid liver abnormality is seen. Distended gallbladder, similar in appearance to prior examination (series 3, image 63). No gallstones, gallbladder wall thickening, or biliary dilatation. Pancreas: Unremarkable. No pancreatic ductal dilatation or surrounding inflammatory changes. Spleen: Normal in size without significant abnormality. Adrenals/Urinary Tract: Adrenal glands are unremarkable. Kidneys are normal, without renal calculi, solid lesion, or hydronephrosis. Bladder is unremarkable. Stomach/Bowel: Stomach is within normal limits. Appendix is not clearly visualized. No evidence of bowel wall thickening, distention, or inflammatory changes. Descending and sigmoid diverticulosis. Moderate burden of stool throughout the colon and rectum, with stool balls in the rectum measuring up to 6.3 cm.  Vascular/Lymphatic: Aortic atherosclerosis. No enlarged abdominal or pelvic lymph nodes. Reproductive: No mass or other abnormality. Other: No abdominal wall hernia or abnormality. No ascites. Musculoskeletal: No acute osseous  findings. Gentle levoscoliosis of the thoracolumbar spine. IMPRESSION: 1. No definite acute noncontrast CT findings of the chest, abdomen, or pelvis. 2. Distended gallbladder, similar in appearance to prior examination dated 2019, without calculi, gallbladder wall thickening, pericholecystic fluid, or biliary ductal dilatation. 3. Examination of the lungs is limited by extensive breath motion artifact. Within this limitation, there is moderate pulmonary fibrosis in a pattern with apical to basal gradient, featuring irregular peripheral interstitial opacity and septal thickening, likely with areas of subpleural bronchiolectasis at the lung bases although poorly evaluated. Findings are most consistent with a probable UIP pattern of fibrosis by ATS criteria. 4. Large hiatal hernia with intrathoracic position of the gastric body and fundus. 5. Moderate burden of stool throughout the colon and rectum, with stool balls in the rectum. Correlate for fecal impaction. 6. Coronary artery disease. 7. Aortic valve calcifications. Correlate for echocardiographic evidence of aortic valve dysfunction. Aortic Atherosclerosis (ICD10-I70.0). Electronically Signed   By: Delanna Ahmadi M.D.   On: 11/28/2021 17:03   CT Cervical Spine Wo Contrast  Result Date: 11/28/2021 CLINICAL DATA:  Neck injury. EXAM: CT HEAD WITHOUT CONTRAST CT CERVICAL SPINE WITHOUT CONTRAST TECHNIQUE: Multidetector CT imaging of the head and cervical spine was performed following the standard protocol without intravenous contrast. Multiplanar CT image reconstructions of the cervical spine were also generated. RADIATION DOSE REDUCTION: This exam was performed according to the departmental dose-optimization program which includes automated exposure control, adjustment of the mA and/or kV according to patient size and/or use of iterative reconstruction technique. COMPARISON:  April 06, 2021. FINDINGS: CT HEAD FINDINGS Brain: Mild chronic ischemic white matter  disease is noted. No mass effect or midline shift is noted. Ventricular size is within normal limits. There is no evidence of mass lesion, hemorrhage or acute infarction. Vascular: No hyperdense vessel or unexpected calcification. Skull: Normal. Negative for fracture or focal lesion. Sinuses/Orbits: No acute finding. Other: None. CT CERVICAL SPINE FINDINGS Alignment: Grade 1 anterolisthesis of C3-4 and C4-5 and C7-T1 is noted secondary to posterior facet joint hypertrophy. Mild grade 1 retrolisthesis of C5-6 is noted secondary to severe degenerative disc disease at this level. Skull base and vertebrae: No acute fracture. No primary bone lesion or focal pathologic process. Soft tissues and spinal canal: No prevertebral fluid or swelling. No visible canal hematoma. Disc levels: Severe degenerative disc disease is noted at C5-6 and C6-7. Upper chest: No acute abnormality is noted. Other: None. IMPRESSION: No acute intracranial abnormality seen. Severe multilevel degenerative disc disease is noted in the cervical spine. No acute abnormality is noted. Electronically Signed   By: Marijo Conception M.D.   On: 11/28/2021 15:44   CT Head Wo Contrast  Result Date: 11/28/2021 CLINICAL DATA:  Neck injury. EXAM: CT HEAD WITHOUT CONTRAST CT CERVICAL SPINE WITHOUT CONTRAST TECHNIQUE: Multidetector CT imaging of the head and cervical spine was performed following the standard protocol without intravenous contrast. Multiplanar CT image reconstructions of the cervical spine were also generated. RADIATION DOSE REDUCTION: This exam was performed according to the departmental dose-optimization program which includes automated exposure control, adjustment of the mA and/or kV according to patient size and/or use of iterative reconstruction technique. COMPARISON:  April 06, 2021. FINDINGS: CT HEAD FINDINGS Brain: Mild chronic ischemic white matter disease is noted. No mass effect or midline shift is noted. Ventricular size  is within  normal limits. There is no evidence of mass lesion, hemorrhage or acute infarction. Vascular: No hyperdense vessel or unexpected calcification. Skull: Normal. Negative for fracture or focal lesion. Sinuses/Orbits: No acute finding. Other: None. CT CERVICAL SPINE FINDINGS Alignment: Grade 1 anterolisthesis of C3-4 and C4-5 and C7-T1 is noted secondary to posterior facet joint hypertrophy. Mild grade 1 retrolisthesis of C5-6 is noted secondary to severe degenerative disc disease at this level. Skull base and vertebrae: No acute fracture. No primary bone lesion or focal pathologic process. Soft tissues and spinal canal: No prevertebral fluid or swelling. No visible canal hematoma. Disc levels: Severe degenerative disc disease is noted at C5-6 and C6-7. Upper chest: No acute abnormality is noted. Other: None. IMPRESSION: No acute intracranial abnormality seen. Severe multilevel degenerative disc disease is noted in the cervical spine. No acute abnormality is noted. Electronically Signed   By: Marijo Conception M.D.   On: 11/28/2021 15:44   DG Chest 2 View  Result Date: 11/28/2021 CLINICAL DATA:  Fall EXAM: CHEST - 2 VIEW COMPARISON:  04/15/2021 FINDINGS: Chronic interstitial changes. Suspect some superimposed interstitial opacities. No pleural effusion or pneumothorax. Normal heart size. Hiatal hernia. Osseous demineralization with multiple compression fractures as seen previously. IMPRESSION: Few interstitial opacities superimposed on chronic changes may reflect edema or viral/atypical pneumonia in the appropriate setting. Electronically Signed   By: Macy Mis M.D.   On: 11/28/2021 14:47        Scheduled Meds:  heparin injection (subcutaneous)  5,000 Units Subcutaneous Q8H   levobunolol  1 drop Both Eyes BID   levothyroxine  25 mcg Oral QAC breakfast   memantine  5 mg Oral BID   multivitamin with minerals  1 tablet Oral Daily   potassium chloride  20 mEq Oral BID   sertraline  25 mg Oral QHS    Continuous Infusions:  sodium chloride 125 mL/hr at 11/29/21 1200   meropenem (MERREM) IV Stopped (11/29/21 1159)     LOS: 0 days    Time spent: 35 minutes    Barb Merino, MD [facility] Pager [phone]

## 2021-11-29 NOTE — Progress Notes (Signed)
Initial Nutrition Assessment  DOCUMENTATION CODES:   Non-severe (moderate) malnutrition in context of chronic illness  INTERVENTION:  Liberalize diet from a heart healthy to a regular diet to provide widest variety of menu options to enhance nutritional adequacy Chopped meats with meals for ease of feedings Ensure Enlive po BID, each supplement provides 350 kcal and 20 grams of protein. Magic cup TID with meals, each supplement provides 290 kcal and 9 grams of protein MVI with minerals daily  NUTRITION DIAGNOSIS:   Moderate Malnutrition related to chronic illness (recurrent UTI, dementia) as evidenced by moderate fat depletion, moderate muscle depletion, severe muscle depletion.  GOAL:   Patient will meet greater than or equal to 90% of their needs  MONITOR:   PO intake, Supplement acceptance, Diet advancement, Labs, Weight trends, Skin  REASON FOR ASSESSMENT:   Consult Assessment of nutrition requirement/status  ASSESSMENT:   Pt admitted after 2 recent falls with poor appetite and increased somnolence. Found to have recurrent UTI. PMH significant for dementia, chronic tremors, MDD, recurrent UTIs/history of ESBL UTI, glaucoma, HTN, hypothyroidism, CKD stage IIIa and h/o skin cancer.  Pt pleasantly sitting up in bed. Noted h/o dementia, unable to obtain detailed nutrition hx from pt. Called and spoke with Nicole Downs via phone call. She states that she has been eating very little PTA. She recalls within the last couple weeks the only food she has eaten well is fruit cup including peaches and applesauce. She has had difficulty cutting foods during admission as she uses Nicole R hand but this arm has the IV which has gotten in the way. Discussed chopped meats on tray which she is agreeable and appreciates the recommendation. She states that Nicole Downs usually loves sweets and had previously been requesting them but once she receives them, she turns it down and does not eat it.    Meal completions: 06/13: 45%-breakfast  Reviewed wt hx. Pt appears to have had a 5.5% wt loss between 05/11-06/06 which is clinically significant for time frame.   Medications: synthroid, NaCl @ 176m/hr, IV merrem  Labs: potassium 3.3, Cr 1.52, Ca 11.5, Vit D 110.26 (H)  NUTRITION - FOCUSED PHYSICAL EXAM:  Flowsheet Row Most Recent Value  Orbital Region Moderate depletion  Upper Arm Region Moderate depletion  Thoracic and Lumbar Region Moderate depletion  Buccal Region Mild depletion  Temple Region Mild depletion  Clavicle Bone Region Severe depletion  Clavicle and Acromion Bone Region Severe depletion  Scapular Bone Region Moderate depletion  Dorsal Hand Moderate depletion  Patellar Region Severe depletion  Anterior Thigh Region Severe depletion  Posterior Calf Region Moderate depletion  Edema (RD Assessment) None  Hair Reviewed  Eyes Reviewed  Mouth Reviewed  Skin Reviewed  Nails Reviewed      Diet Order:   Diet Order             Diet regular Room service appropriate? Yes; Fluid consistency: Thin  Diet effective now                   EDUCATION NEEDS:   Education needs have been addressed  Skin:  Skin Assessment: Skin Integrity Issues: Skin Integrity Issues:: Stage I Stage I: sacrum  Last BM:  6/13 (type 5)  Height:   Ht Readings from Last 1 Encounters:  11/22/21 '4\' 11"'$  (1.499 m)   Weight:   Wt Readings from Last 1 Encounters:  11/29/21 49 kg   BMI:  Body mass index is 21.82 kg/m.  Estimated Nutritional Needs:  Kcal:  1400-1600  Protein:  70-85g  Fluid:  >/=1.5L  Nicole Downs, RDN, LDN Clinical Nutrition

## 2021-11-29 NOTE — TOC Initial Note (Signed)
Transition of Care [facility]) - Initial/Assessment Note    Patient Details  Name: Nicole Downs MRN: [id number] Date of Birth: Jan 31, 1924  [facility]) CM/SW Contact:    Milinda Antis, [place] Phone Number: 11/29/2021, 4:20 PM  Clinical Narrative:                 CSW received consult for possible SNF placement at time of discharge. CSW spoke with patient's daughter due to the patient having dementia.  Patient's daughter expressed understanding of PT recommendation and is agreeable to SNF placement at time of discharge. She reports preference for [facility]. CSW discussed insurance authorization process and will provide Medicare SNF ratings list. Patient has received 3 COVID vaccines. CSW will send out referrals for review.  No further questions reported at this time.   Skilled Nursing Rehab Facilities-   [email or web address]   Ratings out of 5 possible   Name Address  Phone # [place] [facility] Overall  [facility] [address] [place] '4 5 2 3  '$ [facility]  [address] [place], [place] [phone] '3 2 5 5  '$ [facility] [place], [place] '3 1 1 1  '$ [place], [place] '3 2 4 4  '$ [facility] [address], [place] '1 1 2 1  '$ [facility] '2 1 4 3  '$ [facility] [address], [place] '5 2 3 4  '$ [facility] [address], [place] '5 2 2 3  '$ [address] ([place]) [place], [address] [phone] '5 1 2 2  '$ [facility] [phone], [address] [phone] '4 1 2 1  '$ [facility] [address], [facility] [phone] '4 1 2 1  '$ [facility] [place]. [address], [place] [phone] '4 1 1 1  '$ [place] [address] [place] [address] '3 2 4 4           '$ [place] [address] [place] '4 2 3 3  '$ [facility] [place] [address] [place] '4 1 5 4  '$ [place], [place] [phone] '2 1 1 1  '$ [facility] [address], [address] [phone] '2 1 3 2          '$ [address] (no [facility]) [place], [place] [phone] '4 5 5 5  '$ [facility] (No Hum[employer]) [address] [phone] [address], [place] '3 1 4 3  '$ [facility] (No UHC) [place], [place] '5 5 5 5  '$ [facility] [address] [phone] '3 2 4 4  '$ [place] [address] [place] '1 1 2 1  '$ [place] [address], [address] [place] '2 1 1 1  '$ [facility] [place], [place] '5 2 4 5  '$ [facility] [address], [place] '3 1 1 1  '$ [facility] [place], [place] '2 1 2 1          '$ [facility] [address] [place] [phone] '1 1 1 1  '$ [place] [address] [place]  [phone] '2 4 2 2  '$ [facility] [place] [address], [place] [phone] '5 2 3 4  '$ [place] [address] [place]  $'2 1 1 1  'a$ [place] (No Humana) [address] [place] [address] [place] [phone] '2 1 3 2  '$ [facility] [address], [place] [phone] '3 1 1 1          '$ [facility] [place], [place] '5 4 5 5  '$ [facility])  [address], [place] '2 2 3 3  '$ [facility]) [place] [address] [place] '3 2 4 4  '$ [facility] [place] [address], [place] '4 3 4 4  '$ [address] [place], [place] '3 3 1 1  '$ [place] [facility]) [address] [place] [phone] '2 2 4 4     '$ Expected Discharge Plan:  [place] Barriers to Discharge: Continued Medical Work up, SNF Pending bed offer   Patient Goals and CMS Choice   CMS Medicare.gov Compare Post Acute Care list provided to:: Patient Represenative (must comment) Choice offered to / list presented to : Adult Children  Expected Discharge Plan and Services Expected Discharge Plan: [place]                                              Prior Living Arrangements/Services   Lives with:: Adult Children Patient language and need for interpreter reviewed:: Yes Do you feel safe going back to the place where you live?: Yes      Need for Family Participation in Patient Care: Yes (Comment) Care giver support system in place?: Yes (comment)   Criminal Activity/Legal Involvement Pertinent to Current Situation/Hospitalization: No - Comment as needed  Activities of Daily Living      Permission Sought/Granted                  Emotional Assessment       Orientation: : Oriented to Self Alcohol / Substance Use: Not Applicable Psych Involvement: No (comment)  Admission diagnosis:  UTI (urinary tract infection) [N39.0] Failure to thrive (child) [R62.51] AKI (acute kidney injury) ([place]) [N17.9] Urinary tract infection without hematuria, site unspecified [N39.0] Patient Active Problem List   Diagnosis Date Noted   AKI (acute kidney injury) ([place]) 46/65/9935   Acute metabolic encephalopathy 70/17/7939   Hypercalcemia 11/29/2021   Physical deconditioning 11/29/2021   Failure to thrive in adult 11/29/2021   Pulmonary fibrosis ([place]) 11/29/2021   Hiatal hernia 11/29/2021   Pressure injury of skin 11/29/2021   UTI (urinary tract infection) 11/28/2021   DNR (do not resuscitate) 10/27/2021   Gross hematuria 10/27/2021   Acute cough 04/15/2021   Skin tear of forearm without complication, left, initial encounter 02/01/2021   Pain due to onychomycosis of toenails of both feet 10/20/2019   Glaucoma  04/16/2019   Overgrown toenails 04/16/2019   Anemia 04/16/2019   Recurrent UTI 06/07/2018   Diverticulitis 05/02/2018   Moderate episode of recurrent major depressive disorder ([place]) 04/10/2018   Pulmonary emphysema ([place]) 03/29/2017   Memory loss 08/09/2016   Essential tremor 04/10/2016   Vitamin D deficiency 10/08/2015   Lower extremity edema 10/08/2015   Essential hypertension 06/24/2015   Hypothyroidism 06/24/2015   Dementia ([place]) 06/24/2015   Poor peripheral circulation 06/24/2015   PCP:  Pleas Koch, NP Pharmacy[facility]   [id number] [id number]: [phone]:  [phone]     Social Determinants of Health (SDOH) Interventions    Readmission Risk Interventions     No data to display

## 2021-11-29 NOTE — NC FL2 (Signed)
[place] LEVEL OF CARE SCREENING TOOL     IDENTIFICATION  Patient Name: Nicole Downs Birthdate: 1924-02-11 Sex: female Admission Date (Current Location): 11/28/2021  [place] and [place] Number:  Kathleen Argue [phone] [place] and Address:  [facility] [place], [place] [address], [place], [place] [address]      Provider Number: [id number]  Attending Physician Name and Address:  Barb Merino, MD  Relative Name and Phone Number:  Talmadge Coventry (Daughter)   [phone]    Current Level of Care: Hospital Recommended Level of Care: [place] Prior Approval Number:    Date Approved/Denied:   PASRR Number: [id number]  Discharge Plan: SNF    Current Diagnoses: Patient Active Problem List   Diagnosis Date Noted   AKI (acute kidney injury) ([place]) 27/74/1287   Acute metabolic encephalopathy 86/76/7209   Hypercalcemia 11/29/2021   Physical deconditioning 11/29/2021   Failure to thrive in adult 11/29/2021   Pulmonary fibrosis ([place]) 11/29/2021   Hiatal hernia 11/29/2021   Pressure injury of skin 11/29/2021   UTI (urinary tract infection) 11/28/2021   DNR (do not resuscitate) 10/27/2021   Gross hematuria 10/27/2021   Acute cough 04/15/2021   Skin tear of forearm without complication, left, initial encounter 02/01/2021   Pain due to onychomycosis of toenails of both feet 10/20/2019   Glaucoma 04/16/2019   Overgrown toenails 04/16/2019   Anemia 04/16/2019   Recurrent UTI 06/07/2018   Diverticulitis 05/02/2018   Moderate episode of recurrent major depressive disorder ([place]) 04/10/2018   Pulmonary emphysema ([place]) 03/29/2017   Memory loss 08/09/2016   Essential tremor 04/10/2016   Vitamin D deficiency 10/08/2015   Lower extremity edema 10/08/2015   Essential hypertension 06/24/2015   Hypothyroidism 06/24/2015   Dementia ([place]) 06/24/2015   Poor peripheral circulation 06/24/2015    Orientation RESPIRATION BLADDER Height & Weight      Self, Place  Normal Continent Weight: 108 lb 0.4 oz (49 kg) Height:     BEHAVIORAL SYMPTOMS/MOOD NEUROLOGICAL BOWEL NUTRITION STATUS      Continent Diet (see d/c summary)  AMBULATORY STATUS COMMUNICATION OF NEEDS Skin   Limited Assist Verbally PU Stage and Appropriate Care (stage 1 sacrum)                       Personal Care Assistance Level of Assistance  Bathing, Feeding, Dressing Bathing Assistance: Maximum assistance Feeding assistance: Limited assistance Dressing Assistance: Limited assistance     Functional Limitations Info  Sight, Speech, Hearing Sight Info: Impaired Hearing Info: Impaired Speech Info: Adequate    SPECIAL CARE FACTORS FREQUENCY  PT (By licensed PT), OT (By licensed OT)     PT Frequency: 5x/ week OT Frequency: 5x/ week            Contractures Contractures Info: Not present    Additional Factors Info  Code Status, Allergies Code Status Info: DNR Allergies Info: Augmentin (Amoxicillin-pot Clavulanate)   Amlodipine           Current Medications (11/29/2021):  This is the current hospital active medication list Current Facility-Administered Medications  Medication Dose Route Frequency Provider Last Rate Last Admin   0.9 %  sodium chloride infusion   Intravenous Continuous Shela Leff, MD 125 mL/hr at 11/29/21 1200 Infusion Verify at 11/29/21 1200   acetaminophen (TYLENOL) tablet 650 mg  650 mg Oral Q6H PRN Shela Leff, MD       Or   acetaminophen (TYLENOL) suppository 650 mg  650 mg Rectal Q6H PRN  Shela Leff, MD       heparin injection 5,000 Units  5,000 Units Subcutaneous Q8H Shela Leff, MD   5,000 Units at 11/29/21 1302   levobunolol (BETAGAN) 0.5 % ophthalmic solution 1 drop  1 drop Both Eyes BID Shela Leff, MD   1 drop at 11/29/21 0914   levothyroxine (SYNTHROID) tablet 25 mcg  25 mcg Oral QAC breakfast Shela Leff, MD   25 mcg at 11/29/21 0605   memantine (NAMENDA) tablet 5 mg  5 mg  Oral BID Shela Leff, MD   5 mg at 11/29/21 0914   meropenem (MERREM) 500 mg in sodium chloride 0.9 % 100 mL IVPB  500 mg Intravenous Q12H Barb Merino, MD   Stopped at 11/29/21 1159   multivitamin with minerals tablet 1 tablet  1 tablet Oral Daily Barb Merino, MD   1 tablet at 11/29/21 1617   polyethylene glycol (MIRALAX / GLYCOLAX) packet 17 g  17 g Oral Daily PRN Shela Leff, MD       potassium chloride SA (KLOR-CON M) CR tablet 20 mEq  20 mEq Oral BID Barb Merino, MD   20 mEq at 11/29/21 1617   sertraline (ZOLOFT) tablet 25 mg  25 mg Oral QHS Shela Leff, MD   25 mg at 11/29/21 0247     Discharge Medications: Please see discharge summary for a list of discharge medications.  Relevant Imaging Results:  Relevant Lab Results:   Additional Information SSN:  [id number]; Pfizer COVID-19 Vaccine 03/18/2020 , 07/30/2019 , 07/09/2019  Milinda Antis, LCSWA

## 2021-11-29 NOTE — Consult Note (Signed)
Consultation Note Date: 11/29/2021   Patient Name: Nicole Downs  DOB: June 13, 1924  MRN: [id number]  Age / Sex: 88 y.o., female  PCP: Pleas Koch, NP Referring Physician: Barb Merino, MD  Reason for Consultation: Establishing goals of care and Psychosocial/spiritual support  HPI/Patient Profile: 88 y.o. female   admitted on 11/28/2021 with medical history significant of dementia, chronic tremors, MDD, recurrent UTIs/history of ESBL UTI, glaucoma, hypertension, hypothyroidism, CKD stage IIIa, history of skin cancer presented to the ED for evaluation after 2 recent falls yesterday and today.    Daughter reported poor appetite and increased somnolence.  Recently finished a 7-day course of Macrobid for UTI on 6/9.     In the ED, vital signs stable.  Labs showing WBC 10.7, hemoglobin 12.2, platelet count 259k.  Sodium 139, potassium 4.2, chloride 104, bicarb 25, BUN 28, creatinine 1.5 (baseline 1.0-1.2), glucose 99, calcium 12.3, albumin 3.1.  LFTs normal.  Lactic acid normal.  TSH normal.  UA with negative nitrite, small amount of leukocytes, and microscopy showing 21-50 WBCs and many bacteria.  Urine culture pending.   CT head/C-spine negative for acute finding.     CT chest/abdomen/pelvis negative for acute finding.    Family face treatment option decision, advanced directive decisions and anticipatory care needs.   Clinical Assessment and Goals of Care:  This NP Wadie Lessen reviewed medical records, received report from team, assessed the patient and then meet at the patient's bedside  and spoke by phone with her daughter/Nicole Downs  to discuss diagnosis, prognosis, GOC, EOL wishes disposition and options.   Concept of Palliative Care was introduced as specialized medical care for people and their families living with serious illness.  If focuses on providing relief from the symptoms and stress  of a serious illness.    The goal is to improve quality of life for both the patient and the family.  Created space and opportunity for family to explore thoughts and feelings regarding current medical situation.  Daughter verbalizes and describes a situation of continued slow physical, functional and cognitive decline over the past many months.  Patient lives at home with her daughter/only child.  Daughter and her husband are the main caregivers.  Patient Regis Bill have been in touch with outpatient palliative medicine services in the community    A  discussion was had today regarding advanced directives.  Concepts specific to code status, artifical feeding and hydration, continued IV antibiotics and rehospitalization was had.     The difference between a aggressive medical intervention path  and a palliative comfort care path for this patient at this time was had.     Values and goals of care important to patient and family were attempted to be elicited.      Education offered on the natural trajectory and expectations of the terminal disease of dementia.     Questions and concerns addressed.    Family  encouraged to call with questions or concerns.     PMT will continue to support holistically.  Plan is to meet tomorrow morning at 10 AM with daughter at bedside for continued conversation regarding next steps in treatment plan.     Patient has documented H POA    SUMMARY OF RECOMMENDATIONS    Code Status/Advance Care Planning: DNR    Palliative Prophylaxis:  Aspiration, Bowel Regimen, Delirium Protocol, Frequent Pain Assessment, and Oral Care  Additional Recommendations (Limitations, Scope, Preferences): Full Scope Treatment  Psycho-social/Spiritual:  Desire for further Chaplaincy support:no Additional Recommendations: Education on Hospice  Prognosis:  Unable to determine  Discharge Planning: To Be Determined      Primary Diagnoses: Present on  Admission:  UTI (urinary tract infection)   I have reviewed the medical record, interviewed the patient and family, and examined the patient. The following aspects are pertinent.  Past Medical History:  Diagnosis Date   Actinic keratosis    Acute confusion    Acute encephalopathy 04/06/2021   Arthritis    Basal cell carcinoma 11/01/2015   L lat ankle   Basal cell carcinoma 12/13/2015   L dorsum foot   Basal cell carcinoma 09/12/2017   nasal root, R nasal bridge, L nasal bridge, R mandible infra auricular   Basal cell carcinoma 10/24/2017   R lateral foot   Basal cell carcinoma 12/05/2017   L dorsum foot   Basal cell carcinoma 11/27/2018   R of midline mid forehead   Basal cell carcinoma 06/25/2019   nasal bridge   Basal cell carcinoma 12/01/2019   Junction of right mandible and earlobe. Nodular pattern.   Basal cell carcinoma 04/19/2021   right inferior ear at ear lobe, EDC   Emphysema lung ([place])    Glaucoma    Hypertension    Hyponatremia    Hypothyroidism    Memory loss    Poor peripheral circulation    Squamous cell carcinoma in situ (SCCIS) 09/15/2021   right medial clavicle, EDC   Squamous cell carcinoma of skin 09/20/2015   R prox mandible/in situ   Squamous cell carcinoma of skin 02/01/2016   nose   Squamous cell carcinoma of skin 10/24/2017   R lateral knee/in situ, R lat ankle/in situ   Squamous cell carcinoma of skin 12/05/2017   L medial calf/in situ   Squamous cell carcinoma of skin 12/01/2019   Right neck sup. SCCis, hypertrophic EDC, 03/30/21 LN2 for recurrence   Squamous cell carcinoma of skin 12/01/2019   Right neck inf.  SCCis, hypertrophic EDC, 03/30/21 LN2 fo recurrence   Squamous cell carcinoma of skin 03/02/2020   Left upper arm, proximal. SCCis   Squamous cell carcinoma of skin 03/02/2020   Left upper arm, distal. SCCis   Tremors of nervous system    Social History   Socioeconomic History   Marital status: Widowed    Spouse name: Not  on file   Number of children: 1   Years of education: 11th grade   Highest education level: Not on file  Occupational History   Occupation: Retired  Tobacco Use   Smoking status: Never   Smokeless tobacco: Never  Vaping Use   Vaping Use: Never used  Substance and Sexual Activity   Alcohol use: No    Alcohol/week: 0.0 standard drinks of alcohol   Drug use: No   Sexual activity: Not Currently  Other Topics Concern   Not on file  Social History Narrative   Lives with daughter and son-in-law.   Right-handed.   Drinks one soda per day.   Social Determinants of Health  Financial Resource Strain: Low Risk  (04/18/2021)   Overall Financial Resource Strain (CARDIA)    Difficulty of Paying Living Expenses: Not hard at all  Food Insecurity: No Food Insecurity (04/18/2021)   Hunger Vital Sign    Worried About Running Out of Food in the Last Year: Never true    Ran Out of Food in the Last Year: Never true  Transportation Needs: No Transportation Needs (04/18/2021)   PRAPARE - Hydrologist (Medical): No    Lack of Transportation (Non-Medical): No  Physical Activity: Inactive (04/18/2021)   Exercise Vital Sign    Days of Exercise per Week: 0 days    Minutes of Exercise per Session: 0 min  Stress: No Stress Concern Present (04/18/2021)   [place]    Feeling of Stress : Not at all  Social Connections: Moderately Isolated (04/18/2021)   Social Connection and Isolation Panel [NHANES]    Frequency of Communication with Friends and Family: Never    Frequency of Social Gatherings with Friends and Family: More than three times a week    Attends Religious Services: 1 to 4 times per year    Active Member of [employer] or Organizations: No    Attends Archivist Meetings: Never    Marital Status: Widowed   Family History  Problem Relation Age of Onset   Heart attack Mother    Other Father         unsure - she was only 60 years old when he died   Scheduled Meds:  heparin injection (subcutaneous)  5,000 Units Subcutaneous Q8H   levobunolol  1 drop Both Eyes BID   levothyroxine  25 mcg Oral QAC breakfast   memantine  5 mg Oral BID   sertraline  25 mg Oral QHS   Continuous Infusions:  sodium chloride 125 mL/hr at 11/29/21 1200   meropenem (MERREM) IV Stopped (11/29/21 1159)   PRN Meds:.acetaminophen **OR** acetaminophen, polyethylene glycol Medications Prior to Admission:  Prior to Admission medications   Medication Sig Start Date End Date Taking? Authorizing Provider  Acetaminophen (TYLENOL PO) Take 1 tablet by mouth at bedtime.   Yes [provider]  busPIRone (BUSPAR) 5 MG tablet TAKE 1 TABLET (5 MG TOTAL) BY MOUTH 2 (TWO) TIMES DAILY. AS NEEDED FOR ANXIETY. Patient taking differently: Take 5 mg by mouth 2 (two) times daily as needed (anxiety). 09/16/21  Yes Pleas Koch, NP  Carboxymethylcellulose Sodium (REFRESH TEARS OP) Place 1 drop into both eyes in the morning and at bedtime.   Yes [provider]  Cholecalciferol (VITAMIN D3) 10000 units TABS Take 1,000 mg by mouth every morning.   Yes [provider]  GLUCOSAMINE-CHONDROITIN PO Take 1 capsule by mouth every morning.   Yes [provider]  levobunolol (BETAGAN) 0.5 % ophthalmic solution Place 1 drop into both eyes 2 (two) times daily.   Yes [provider]  levothyroxine (SYNTHROID) 25 MCG tablet TAKE 1 TABLET BY MOUTH EVERY MORNING ON AN EMPTY STOMACH WITH WATER ONLY. NO FOOD OR OTHER MEDICATIONS FOR 30 MINUTES. OFFICE VISIT REQUIRED FOR FURTHER REFILLS. Patient taking differently: Take 25 mcg by mouth daily before breakfast.  on an empty stomach with water only.  No food or other medications for 30 minutes. Office visit required for further refills. 05/24/21  Yes Pleas Koch, NP  losartan (COZAAR) 25 MG tablet TAKE 1 TABLET BY MOUTH EVERY DAY FOR BLOOD  PRESSURE Patient taking differently: Take 25 mg by mouth every morning. For blood pressure 04/10/21  Yes Pleas Koch, NP  melatonin 1 MG TABS tablet Take 1 mg by mouth at bedtime.   Yes [provider]  memantine (NAMENDA) 5 MG tablet Take 1 tablet by mouth every morning and 2 tablets by mouth every evening for memory and behavior Patient taking differently: Take 5 mg by mouth 2 (two) times daily.  for memory and behavior 11/28/21  Yes Pleas Koch, NP  sertraline (ZOLOFT) 25 MG tablet TAKE 1 TABLET BY MOUTH AT BEDTIME FOR DEPRESSION Patient taking differently: Take 25 mg by mouth at bedtime.  for depression. 05/11/21  Yes Pleas Koch, NP  nitrofurantoin, macrocrystal-monohydrate, (MACROBID) 100 MG capsule Take 100 mg by mouth 2 (two) times daily. Patient not taking: Reported on 11/28/2021    [provider]   Allergies  Allergen Reactions   Augmentin [Amoxicillin-Pot Clavulanate] Rash   Amlodipine Other (See Comments)    Ankle edema   Review of Systems  Unable to perform ROS: Dementia    Physical Exam Constitutional:      Appearance: She is underweight.  Cardiovascular:     Rate and Rhythm: Normal rate.  Musculoskeletal:     Comments: Generalized weakness and muscle atrophy  Skin:    General: Skin is warm and dry.  Neurological:     Mental Status: She is alert.     Vital Signs: BP (!) 158/71 (BP Location: Left Arm)   Pulse 74   Temp 98 F (36.7 C) (Oral)   Resp 18   Wt 49 kg   SpO2 96%   BMI 21.82 kg/m  Pain Scale: 0-10   Pain Score: 0-No pain   SpO2: SpO2: 96 % O2 Device:SpO2: 96 % O2 Flow Rate: .   IO: Intake/output summary:  Intake/Output Summary (Last 24 hours) at 11/29/2021 1345 Last data filed at 11/29/2021 1200 Gross per 24 hour  Intake 1141.07 ml  Output 0 ml  Net 1141.07 ml    LBM: Last BM Date : 11/29/21 Baseline Weight: Weight: 49 kg Most recent weight: Weight: 49 kg     Palliative Assessment/Data: 40% at  best    Discussed with attending team and bedside RN  Signed by: Wadie Lessen, NP   Please contact Palliative Medicine Team phone at [phone] for questions and concerns.  For individual provider: See Shea Evans

## 2021-11-29 NOTE — Evaluation (Signed)
Physical Therapy Evaluation Patient Details Name: Nicole Downs MRN: [id number] DOB: 07/15/1923 Today's Date: 11/29/2021  History of Present Illness  Nicole Downs is a 88 y.o. female presented to the ED for evaluation after 2 recent falls yesterday and today.  Daughter reported poor appetite and increased somnolence.  Recently finished a 7-day course of Macrobid for UTI on 6/9.  Daughter/ HCPOA told ED physician that patient is a DNR and they are working on starting palliative care; with medical history significant of dementia, chronic tremors, MDD, recurrent UTIs/history of ESBL UTI, glaucoma, hypertension, hypothyroidism, CKD stage IIIa, history of skin cancer  Clinical Impression   Pt admitted with above diagnosis. Lives at home with her daughter, in a multi-level home (I believe she can stay on main floor) with steps to enter; Prior to admission, pt was able to walk household distances and some in the community with RW, using a transport chair when needed; daughter assist with bathing; Daughter reports a decline over the past few weeks, decr eating, 2 falls; Presents to PT with generalized weakness, increased risk of falls, impulsivity; decr awareness of safety;  Pt is pleasant, and following all cues/commands (that she can understand; HOH); Needs close supervision, though, as she will impulsively get up without assist (especially with respect to toileting needs); Currently needing Light moderate assist to get up to EOB, perform transfer to [facility]; Needs mod physical assist to walk a short distance with RW;   Pt currently with functional limitations due to the deficits listed below (see PT Problem List). Pt will benefit from skilled PT to increase their independence and safety with mobility to allow discharge to the venue listed below.       In considering options for discharge, I value going back to familiar environment, caregivers, and routines for patients with dementia; At this point,  though, [name] shows rehab potential, provided her po intake is adequate; It is worth considering a short-term SNF stay for rehab to work towards decreasing fall risk and decreasing caregiver burden; Perhaps a short-term SNF stay will also give her daughter Nicole Downs some needed caregiver respite;     Recommendations for follow up therapy are one component of a multi-disciplinary discharge planning process, led by the attending physician.  Recommendations may be updated based on patient status, additional functional criteria and insurance authorization.  Follow Up Recommendations Skilled nursing-short term rehab (<3 hours/day)    Assistance Recommended at Discharge Frequent or constant Supervision/Assistance  Patient can return home with the following  A little help with walking and/or transfers;A lot of help with bathing/dressing/bathroom;Assist for transportation;Help with stairs or ramp for entrance;Other (comment) (Patient and daughter will benenfit from Armed forces logistics/support/administrative officer)    Presenter, broadcasting (measurements PT);Other (comment) (Can consider hospital bed)  Recommendations for Other Services  OT consult (and Palliative Care Team)    Functional Status Assessment Patient has had a recent decline in their functional status and demonstrates the ability to make significant improvements in function in a reasonable and predictable amount of time.     Precautions / Restrictions Precautions Precautions: Fall      Mobility  Bed Mobility Overal bed mobility: Needs Assistance Bed Mobility: Supine to Sit     Supine to sit: Min assist     General bed mobility comments: Min assist to rise to sit and scoot to eOB    Transfers Overall transfer level: Needs assistance Equipment used: None, Rolling walker (2 wheels) Transfers: Sit to/from Stand, Bed to chair/wheelchair/BSC Sit  to Stand: Mod assist   Step pivot transfers: Mod assist       General  transfer comment: Light mod assist to steady during transfers    Ambulation/Gait Ambulation/Gait assistance: Min assist Gait Distance (Feet): 5 Feet (including turning steps) Assistive device: Rolling walker (2 wheels) Gait Pattern/deviations: Decreased step length - right, Decreased step length - left, Trunk flexed       General Gait Details: Light mod assist to steady, a few losses of balance posteriorly  Stairs            Wheelchair Mobility    Modified Rankin (Stroke Patients Only)       Balance Overall balance assessment: Needs assistance   Sitting balance-Leahy Scale: Fair       Standing balance-Leahy Scale: Poor                               Pertinent Vitals/Pain Pain Assessment Pain Assessment: Faces Faces Pain Scale: No hurt Pain Intervention(s): Monitored during session (Placed air cushion in seat of recliner)    Home Living Family/patient expects to be discharged to:: Private residence Living Arrangements: Children Available Help at Discharge: Family;Available 24 hours/day Type of Home: House Home Access: Stairs to enter Entrance Stairs-Rails: Left Entrance Stairs-Number of Steps: 8   Home Layout: Multi-level;Able to live on main level with bedroom/bathroom Home Equipment: Rolling Walker (2 wheels);Shower seat;BSC/3in1      Prior Function Prior Level of Function : Needs assist  Cognitive Assist : Mobility (cognitive);ADLs (cognitive)           Mobility Comments: walks with RW ADLs Comments: Daughter assist with bathing; shower set in tub/shower combo     Hand Dominance   Dominant Hand: Right    Extremity/Trunk Assessment   Upper Extremity Assessment Upper Extremity Assessment: Defer to OT evaluation    Lower Extremity Assessment Lower Extremity Assessment: Generalized weakness    Cervical / Trunk Assessment Cervical / Trunk Assessment: Kyphotic  Communication   Communication: HOH  Cognition Arousal/Alertness:  Awake/alert Behavior During Therapy: WFL for tasks assessed/performed, Impulsive Overall Cognitive Status: Impaired/Different from baseline Area of Impairment: Memory                     Memory: Decreased short-term memory (history of dementia)         General Comments: Daughter reports she has been eating less, doing her crosswords less, and is less interested in watching the Young and the Restless voer the past few weeks        General Comments General comments (skin integrity, edema, etc.): Pt's daughter Nicole Downs, has provided assist to pt for years, and is noting a decline in recent weeks; Nicole Downs is working on getting her mother support through [place], and is in the process of changing to [place] [facility]; Nicole Downs is interested in getting Palliative and Hospice Services    Exercises     Assessment/Plan    PT Assessment Patient needs continued PT services  PT Problem List Decreased strength;Decreased activity tolerance;Decreased balance;Decreased mobility;Decreased coordination;Decreased cognition;Decreased knowledge of use of DME;Decreased safety awareness;Decreased knowledge of precautions;Decreased skin integrity       PT Treatment Interventions DME instruction;Gait training;Stair training;Functional mobility training;Therapeutic activities;Therapeutic exercise;Balance training;Neuromuscular re-education;Cognitive remediation;Patient/family education    PT Goals (Current goals can be found in the Care Plan section)  Acute Rehab PT Goals Patient Stated Goal: Did not state PT Goal Formulation: With patient/family Time For Goal  Achievement: 12/13/21 Potential to Achieve Goals: Good    Frequency Min 2X/week (Will adjust frequency if family chooses home)     Co-evaluation               AM-PAC PT "6 Clicks" Mobility  Outcome Measure Help needed turning from your back to your side while in a flat bed without using bedrails?: None Help needed moving from lying on  your back to sitting on the side of a flat bed without using bedrails?: A Little Help needed moving to and from a bed to a chair (including a wheelchair)?: A Little Help needed standing up from a chair using your arms (e.g., wheelchair or bedside chair)?: A Little Help needed to walk in hospital room?: A Little Help needed climbing 3-5 steps with a railing? : A Lot 6 Click Score: 18    End of Session Equipment Utilized During Treatment: Gait belt Activity Tolerance: Patient tolerated treatment well Patient left: in chair;with call bell/phone within reach;with chair alarm set Nurse Communication: Mobility status;Other (comment) (Difficulty with IV) PT Visit Diagnosis: Unsteadiness on feet (R26.81);Other abnormalities of gait and mobility (R26.89);History of falling (Z91.81)    Time: 0165-5374 PT Time Calculation (min) (ACUTE ONLY): 30 min   Charges:   PT Evaluation $PT Eval Moderate Complexity: 1 Mod PT Treatments $Gait Training: 8-22 mins        Roney Marion, PT  Acute Rehabilitation Services Office [phone]   Colletta Maryland 11/29/2021, 2:16 PM

## 2021-11-30 DIAGNOSIS — R627 Adult failure to thrive: Secondary | ICD-10-CM | POA: Diagnosis not present

## 2021-11-30 DIAGNOSIS — Z515 Encounter for palliative care: Secondary | ICD-10-CM

## 2021-11-30 DIAGNOSIS — N390 Urinary tract infection, site not specified: Secondary | ICD-10-CM | POA: Diagnosis not present

## 2021-11-30 DIAGNOSIS — R5381 Other malaise: Secondary | ICD-10-CM

## 2021-11-30 DIAGNOSIS — F02C11 Dementia in other diseases classified elsewhere, severe, with agitation: Secondary | ICD-10-CM

## 2021-11-30 DIAGNOSIS — G309 Alzheimer's disease, unspecified: Secondary | ICD-10-CM

## 2021-11-30 LAB — CBC WITH DIFFERENTIAL/PLATELET
Abs Immature Granulocytes: 0.04 10*3/uL (ref 0.00–0.07)
Basophils Absolute: 0.1 10*3/uL (ref 0.0–0.1)
Basophils Relative: 1 %
Eosinophils Absolute: 0.3 10*3/uL (ref 0.0–0.5)
Eosinophils Relative: 3 %
HCT: 30.9 % — ABNORMAL LOW (ref 36.0–46.0)
Hemoglobin: 10.6 g/dL — ABNORMAL LOW (ref 12.0–15.0)
Immature Granulocytes: 0 %
Lymphocytes Relative: 24 %
Lymphs Abs: 2.6 10*3/uL (ref 0.7–4.0)
MCH: 31.8 pg (ref 26.0–34.0)
MCHC: 34.3 g/dL (ref 30.0–36.0)
MCV: 92.8 fL (ref 80.0–100.0)
Monocytes Absolute: 1.3 10*3/uL — ABNORMAL HIGH (ref 0.1–1.0)
Monocytes Relative: 12 %
Neutro Abs: 6.3 10*3/uL (ref 1.7–7.7)
Neutrophils Relative %: 60 %
Platelets: 235 10*3/uL (ref 150–400)
RBC: 3.33 MIL/uL — ABNORMAL LOW (ref 3.87–5.11)
RDW: 12.6 % (ref 11.5–15.5)
WBC: 10.6 10*3/uL — ABNORMAL HIGH (ref 4.0–10.5)
nRBC: 0 % (ref 0.0–0.2)

## 2021-11-30 LAB — URINE CULTURE: Culture: >=100000 — AB

## 2021-11-30 LAB — BASIC METABOLIC PANEL
Anion gap: 8 (ref 5–15)
BUN: 20 mg/dL (ref 8–23)
CO2: 24 mmol/L (ref 22–32)
Calcium: 10.4 mg/dL — ABNORMAL HIGH (ref 8.9–10.3)
Chloride: 107 mmol/L (ref 98–111)
Creatinine, Ser: 1.31 mg/dL — ABNORMAL HIGH (ref 0.44–1.00)
GFR, Estimated: 37 mL/min — ABNORMAL LOW (ref >60)
Glucose, Bld: 83 mg/dL (ref 70–99)
Potassium: 4 mmol/L (ref 3.5–5.1)
Sodium: 139 mmol/L (ref 135–145)

## 2021-11-30 LAB — PTH, INTACT AND CALCIUM
Calcium, Total (PTH): 11.5 mg/dL — ABNORMAL HIGH (ref 8.7–10.3)
PTH: 7 pg/mL — ABNORMAL LOW (ref 15–65)

## 2021-11-30 LAB — PHOSPHORUS: Phosphorus: 2 mg/dL — ABNORMAL LOW (ref 2.5–4.6)

## 2021-11-30 LAB — MAGNESIUM: Magnesium: 1.6 mg/dL — ABNORMAL LOW (ref 1.7–2.4)

## 2021-11-30 MED ORDER — MAGNESIUM SULFATE 2 GM/50ML IV SOLN: 2.0000 g | Freq: Once | INTRAVENOUS | Status: DC

## 2021-11-30 MED ORDER — HYDRALAZINE HCL 25 MG PO TABS
25.0000 mg | ORAL_TABLET | Freq: Three times a day (TID) | ORAL | Status: DC
Start: 2021-11-30 — End: 2021-12-01
  Administered 2021-11-30 (×3): 25 mg via ORAL
  Filled 2021-11-30 (×3): qty 1

## 2021-11-30 MED ORDER — K PHOS MONO-SOD PHOS DI & MONO 155-852-130 MG PO TABS
500.0000 mg | ORAL_TABLET | Freq: Three times a day (TID) | ORAL | Status: DC
  Administered 2021-11-30: 500 mg via ORAL
  Filled 2021-11-30: qty 2

## 2021-11-30 NOTE — Progress Notes (Signed)
Physical Therapy Treatment Patient Details Name: Cyd Hostler MRN: [id number] DOB: Apr 13, 1924 Today's Date: 11/30/2021   History of Present Illness Elbia Paro is a 88 y.o. female presented to the ED for evaluation after 2 recent falls yesterday and today.  Daughter reported poor appetite and increased somnolence.  Recently finished a 7-day course of Macrobid for UTI on 6/9.  Daughter/ HCPOA told ED physician that patient is a DNR and they are working on starting palliative care; with medical history significant of dementia, chronic tremors, MDD, recurrent UTIs/history of ESBL UTI, glaucoma, hypertension, hypothyroidism, CKD stage IIIa, history of skin cancer    PT Comments    This PT was outside pt's room when noted she was trying to get up unassisted; entered room and pt reported she needed to go to bathroom; assisted her up to [facility], however pt didn't entirely understand that she could void, and it was clear that she needed to go to the bathroom proper to meet her toileting needs; Assisted pt into the bathroom with RW and min/mod assist; tehn walked to the sink to wash hands and into the hallway; hopefully the increased activity will meet a need in Ms. Ramnauth to move, and she will be more able to settle down; left pt in sitting in recliner, chair alarm on, starting to eat chocolate magic cup.  Recommendations for follow up therapy are one component of a multi-disciplinary discharge planning process, led by the attending physician.  Recommendations may be updated based on patient status, additional functional criteria and insurance authorization.  Follow Up Recommendations  Other (comment) (Per Palliative Team, home with hospice services; recommend looking into lots of HHAide support in the home to help unburden pt's daughter, [name])     Assistance Recommended at Discharge Frequent or constant Supervision/Assistance  Patient can return home with the following A little help with  walking and/or transfers;A lot of help with bathing/dressing/bathroom;Assist for transportation;Help with stairs or ramp for entrance;Other (comment) (Patient and daughter will benenfit from Armed forces logistics/support/administrative officer)   Teacher, early years/pre (measurements PT);Other (comment)    Recommendations for Other Services       Precautions / Restrictions Precautions Precautions: Fall Precaution Comments: Impulsive Restrictions Weight Bearing Restrictions: No     Mobility  Bed Mobility                    Transfers Overall transfer level: Needs assistance Equipment used: Rolling walker (2 wheels) Transfers: Sit to/from Stand Sit to Stand: Min assist   Step pivot transfers: Min assist       General transfer comment: steadying assist    Ambulation/Gait Ambulation/Gait assistance: Min assist, Mod assist Gait Distance (Feet): 70 Feet Assistive device: Rolling walker (2 wheels) Gait Pattern/deviations: Decreased step length - right, Decreased step length - left, Trunk flexed       General Gait Details: Min assist to steady, and light mod assist for RW management as she tended to have it too far in front   Stairs             Wheelchair Mobility    Modified Rankin (Stroke Patients Only)       Balance     Sitting balance-Leahy Scale: Fair       Standing balance-Leahy Scale: Poor                              Cognition Arousal/Alertness: Awake/alert Behavior During Therapy: Impulsive, Restless  Overall Cognitive Status: History of cognitive impairments - at baseline Area of Impairment: Memory                     Memory: Decreased short-term memory (history of dementia)         General Comments: pt with poor awareness of safety, disoriented, much of conversation in nonsensical, pt with known dementia        Exercises      General Comments General comments (skin integrity, edema, etc.): Noted pt was  quite restless and trying to get up out of recliner witout assist      Pertinent Vitals/Pain Pain Assessment Pain Assessment: Faces Faces Pain Scale: No hurt Pain Intervention(s): Monitored during session    Home Living                          Prior Function            PT Goals (current goals can now be found in the care plan section) Acute Rehab PT Goals Patient Stated Goal: Did not state, but restless upon entry PT Goal Formulation: With patient/family Time For Goal Achievement: 12/13/21 Potential to Achieve Goals: Good Progress towards PT goals: Progressing toward goals    Frequency    Min 3X/week      PT Plan Discharge plan needs to be updated    Co-evaluation              AM-PAC PT "6 Clicks" Mobility   Outcome Measure  Help needed turning from your back to your side while in a flat bed without using bedrails?: None Help needed moving from lying on your back to sitting on the side of a flat bed without using bedrails?: A Little Help needed moving to and from a bed to a chair (including a wheelchair)?: A Little Help needed standing up from a chair using your arms (e.g., wheelchair or bedside chair)?: A Little Help needed to walk in hospital room?: A Little Help needed climbing 3-5 steps with a railing? : A Lot 6 Click Score: 18    End of Session Equipment Utilized During Treatment: Gait belt Activity Tolerance: Patient tolerated treatment well Patient left: in chair;with call bell/phone within reach;with chair alarm set;Other (comment) (encouraged pt to eat some of her lunch) Nurse Communication: Mobility status PT Visit Diagnosis: Unsteadiness on feet (R26.81);Other abnormalities of gait and mobility (R26.89);History of falling (Z91.81)     Time: 1224-8250 PT Time Calculation (min) (ACUTE ONLY): 23 min  Charges:  $Gait Training: 23-37 mins $Therapeutic Activity: 8-22 mins                     Roney Marion, PT  Acute Rehabilitation  Services Office [phone]    Colletta [place] 11/30/2021, 2:57 PM

## 2021-11-30 NOTE — TOC Progression Note (Addendum)
Transition of Care [facility]) - Initial/Assessment Note    Patient Details  Name: Nicole Downs MRN: [id number] Date of Birth: 1924/06/09  Transition [facility]) CM/SW Contact:    Milinda Antis, R[place] Phone Number: 11/30/2021, 4:42 PM  Clinical Narrative:                 CSW spoke with the patient's daughter who is in agreement with the patient going home with home hospice through [place].  A home hospice referral has been made with assistance from the [facility] outpatient liaison.    Expected Discharge Plan: Skilled Nursing Facility Barriers to Discharge: Continued Medical Work up, SNF Pending bed offer   Patient Goals and CMS Choice   CMS Medicare.gov Compare Post Acute Care list provided to:: Patient Represenative (must comment) Choice offered to / list presented to : Adult Children  Expected Discharge Plan and Services Expected Discharge Plan: [place]                                              Prior Living Arrangements/Services   Lives with:: Adult Children Patient language and need for interpreter reviewed:: Yes Do you feel safe going back to the place where you live?: Yes      Need for Family Participation in Patient Care: Yes (Comment) Care giver support system in place?: Yes (comment)   Criminal Activity/Legal Involvement Pertinent to Current Situation/Hospitalization: No - Comment as needed  Activities of Daily Living      Permission Sought/Granted                  Emotional Assessment       Orientation: : Oriented to Self Alcohol / Substance Use: Not Applicable Psych Involvement: No (comment)  Admission diagnosis:  UTI (urinary tract infection) [N39.0] Failure to thrive (child) [R62.51] AKI (acute kidney injury) ([place]) [N17.9] Urinary tract infection without hematuria, site unspecified [N39.0] Patient Active Problem List   Diagnosis Date Noted   AKI (acute kidney injury) ([place]) 62/70/3500   Acute  metabolic encephalopathy 93/81/8299   Hypercalcemia 11/29/2021   Physical deconditioning 11/29/2021   Failure to thrive in adult 11/29/2021   Pulmonary fibrosis ([place]) 11/29/2021   Hiatal hernia 11/29/2021   Pressure injury of skin 11/29/2021   UTI (urinary tract infection) 11/28/2021   DNR (do not resuscitate) 10/27/2021   Gross hematuria 10/27/2021   Acute cough 04/15/2021   Skin tear of forearm without complication, left, initial encounter 02/01/2021   Pain due to onychomycosis of toenails of both feet 10/20/2019   Glaucoma 04/16/2019   Overgrown toenails 04/16/2019   Anemia 04/16/2019   Recurrent UTI 06/07/2018   Diverticulitis 05/02/2018   Moderate episode of recurrent major depressive disorder ([place]) 04/10/2018   Pulmonary emphysema ([place]) 03/29/2017   Memory loss 08/09/2016   Essential tremor 04/10/2016   Vitamin D deficiency 10/08/2015   Lower extremity edema 10/08/2015   Essential hypertension 06/24/2015   Hypothyroidism 06/24/2015   Dementia ([place]) 06/24/2015   Poor peripheral circulation 06/24/2015   PCP:  Pleas Koch, NP Pharmacy:   [id number] [place] [id number]: [phone]: [phone]    Social Determinants of Health (SDOH) Interventions    Readmission Risk Interventions     No data to display

## 2021-11-30 NOTE — Progress Notes (Signed)
[facility] [address] [employer] [facility]) Hospital Liaison Note   Received request from [place], TOC, for hospice services at home after discharge.   Spoke with patients daughter, Talmadge Coventry, to initiate education related to hospice philosophy, services, and team approach to care. [name] verbalized understanding of information given. Per discussion, the plan is for discharge home tomorrow by PTAR.   DME needs discussed. Patient has the following equipment in the home: walker, lightweight wheelchair, regular wheelchair and shower seat.   No further DME requested at this time. Will assess needs at the home on the assessment visit.   Please send completed and signed DNR home with patient/family.   Please provide prescriptions at discharge as needed to ensure ongoing symptom management.   Please call with any hospice related questions or concerns.   Thank you for the opportunity to participate in this patients care.   Margaretmary Eddy, BSN, RN [facility] Liaison [phone]

## 2021-11-30 NOTE — Progress Notes (Signed)
Physical Therapy Treatment Patient Details Name: Shila Kruczek MRN: [id number] DOB: September 21, 1923 Today's Date: 11/30/2021   History of Present Illness Bryli Mantey is a 88 y.o. female presented to the ED for evaluation after 2 recent falls yesterday and today.  Daughter reported poor appetite and increased somnolence.  Recently finished a 7-day course of Macrobid for UTI on 6/9.  Daughter/ HCPOA told ED physician that patient is a DNR and they are working on starting palliative care; with medical history significant of dementia, chronic tremors, MDD, recurrent UTIs/history of ESBL UTI, glaucoma, hypertension, hypothyroidism, CKD stage IIIa, history of skin cancer    PT Comments    Continuing work on functional mobility and activity tolerance;  Entered room as pt was getting up form chair unassisted (chair alarm on); Pt wanted to get to the bathroom, at that time, opted to get to [facility] due to urgency; Overall min assist to help stand and take pivot steps to [facility]; ended session with NT helping pt after voiding;   Briefly spoke with pt's daughter [name] after meeting with Palliative Medicine; [name] is opting for getting pt home with hospice services; Highly recommend that pt and family have more services/support in the home, and emphasized this to [facility]  Recommendations for follow up therapy are one component of a multi-disciplinary discharge planning process, led by the attending physician.  Recommendations may be updated based on patient status, additional functional criteria and insurance authorization.  Follow Up Recommendations  Other (comment)     Assistance Recommended at Discharge    Patient can return home with the following A little help with walking and/or transfers;A lot of help with bathing/dressing/bathroom;Assist for transportation;Help with stairs or ramp for entrance;Other (comment) (Patient and daughter will benenfit from [employer]/support/administrative officer)  Does her insurance cover  [facility]?   Equipment Recommendations  Wheelchair cushion (measurements PT);Other (comment) (Can consider hospital bed)    Recommendations for Other Services       Precautions / Restrictions Precautions Precautions: Fall Precaution Comments: Impulsive Restrictions Weight Bearing Restrictions: No     Mobility  Bed Mobility                    Transfers Overall transfer level: Needs assistance Equipment used: 1 person hand held assist Transfers: Sit to/from Stand, Bed to chair/wheelchair/BSC Sit to Stand: Min assist   Step pivot transfers: Min assist       General transfer comment: steadying assist during transfer from recliner to [facility]    Ambulation/Gait                   Stairs             Wheelchair Mobility    Modified Rankin (Stroke Patients Only)       Balance     Sitting balance-Leahy Scale: Fair       Standing balance-Leahy Scale: Poor                              Cognition Arousal/Alertness: Awake/alert Behavior During Therapy: Impulsive, Restless Overall Cognitive Status: History of cognitive impairments - at baseline                                 General Comments: pt with poor awareness of safety, disoriented, much of conversation in nonsensical, pt with known dementia  Exercises      General Comments General comments (skin integrity, edema, etc.): Pt's Daughter working meeting with Palliative Medicine      Pertinent Vitals/Pain Pain Assessment Pain Assessment: Faces Faces Pain Scale: No hurt Pain Intervention(s): Monitored during session    Home Living Family/patient expects to be discharged to:: Private residence Living Arrangements: Children Available Help at Discharge: Family;Available 24 hours/day Type of Home: House Home Access: Stairs to enter Entrance Stairs-Rails: Left Entrance Stairs-Number of Steps: 8   Home Layout: Multi-level;Able to live on main level with  bedroom/bathroom Home Equipment: Rolling Walker (2 wheels);Shower seat;BSC/3in1 Additional Comments: Home Living information gleaned from PT note, pt unable to report.    Prior Function            PT Goals (current goals can now be found in the care plan section) Acute Rehab PT Goals Patient Stated Goal: to get to the toilet PT Goal Formulation: With patient/family Time For Goal Achievement: 12/13/21 Potential to Achieve Goals: Good Progress towards PT goals: Progressing toward goals    Frequency    Min 3X/week      PT Plan Current plan remains appropriate;Frequency needs to be updated (though will follow Palliative Medicine's plan)    Co-evaluation              AM-PAC PT "6 Clicks" Mobility   Outcome Measure  Help needed turning from your back to your side while in a flat bed without using bedrails?: None Help needed moving from lying on your back to sitting on the side of a flat bed without using bedrails?: A Little Help needed moving to and from a bed to a chair (including a wheelchair)?: A Little Help needed standing up from a chair using your arms (e.g., wheelchair or bedside chair)?: A Little Help needed to walk in hospital room?: A Little Help needed climbing 3-5 steps with a railing? : A Lot 6 Click Score: 18    End of Session Equipment Utilized During Treatment: Gait belt Activity Tolerance: Patient tolerated treatment well Patient left: with nursing/sitter in room (On bSC)   PT Visit Diagnosis: Unsteadiness on feet (R26.81);Other abnormalities of gait and mobility (R26.89);History of falling (Z91.81)     Time: 1030-1040 PT Time Calculation (min) (ACUTE ONLY): 10 min  Charges:  $Therapeutic Activity: 8-22 mins                     Roney Marion, PT  Acute Rehabilitation Services Office [place] 11/30/2021, 1:16 PM

## 2021-11-30 NOTE — Progress Notes (Signed)
Patient ID: Nicole Downs, female   DOB: 1924-06-09, 88 y.o.   MRN: [id number]    Progress Note from the Palliative Medicine Team at [facility]   Patient Name: Nicole Downs        Date: 11/30/2021 DOB: 02-25-1924  Age: 88 y.o. MRN#: [id number] Attending Physician: Shelly Coss, MD Primary Care Physician: Pleas Koch, NP Admit Date: 11/28/2021   Medical records reviewed    88 y.o. female presented to the ED for evaluation after 2 recent falls yesterday and today.  Daughter reported poor appetite and increased somnolence.  Recently finished a 7-day course of Macrobid for UTI on 6/9.  Daughter/ HCPOA told ED physician that patient is a DNR and they are working on starting palliative care; with medical history significant of dementia, chronic tremors, MDD, recurrent UTIs/history of ESBL UTI, glaucoma, hypertension, hypothyroidism, CKD stage IIIa, history of skin cancer  Family face treatment option decision, advanced directive decisions and anticipatory care needs.   This NP visited patient at the bedside as a follow up to  yesterday's [place].  And to meet as scheduled with patient's daughter.  Patient is alert, she is pleasantly confused.  She is sitting up in bed.   Daughter /Wanda and her husband met with me for continued conversation regarding current medical situation.  Family describe continued physical, functional and cognitive decline of the patient over the past many months.  She is increasingly weak, confused,  needs redirection she is high risk for fall.  Education offered on the natural trajectory of dementia overall adult failure to thrive; recent hours of sleep, decreased reaction, decreased oral intake discussed.    Education offered on the difference between an aggressive medical intervention path and a palliative comfort path for this, patient at this time, in this situation.  Education offered on hospice benefit;Philosophy and eligibility both in  the home and residential.  Daughter is able to verbalize an understanding that her mother is failing to thrive and the comfort and dignity are the priority for her at this time knowing life expectancy is limited.   Plan of care: -DNR/DNI -No artificial feeding now or in the future/diet as tolerated -No further antibiotic use -No further diagnostics; labs, scans, fingersticks -Transition home with hospice services-family has previously been hooked in with outpatient community-based palliative services with a [facility] -MOST form completed to reflect comfort -Prognosis is less than 6 months -Family anticipate discharge home in the morning   Education offered today regarding  the importance of continued conversation with family and their  medical providers regarding overall plan of care and treatment options,  ensuring decisions are within the context of the patients values and GOCs.  Questions and concerns addressed   Discussed with Dr Donia Pounds NP  Palliative Medicine Team Team Phone # [phone] Pager [phone]

## 2021-11-30 NOTE — Evaluation (Signed)
Occupational Therapy Evaluation Patient Details Name: Nicole Downs MRN: [id number] DOB: [birth year] Today's Date: 11/30/2021   History of Present Illness Nicole Downs is a 88 y.o. female presented to the ED for evaluation after 2 recent falls yesterday and today.  Daughter reported poor appetite and increased somnolence.  Recently finished a 7-day course of Macrobid for UTI on 6/9.  Daughter/ HCPOA told ED physician that patient is a DNR and they are working on starting palliative care; with medical history significant of dementia, chronic tremors, MDD, recurrent UTIs/history of ESBL UTI, glaucoma, hypertension, hypothyroidism, CKD stage IIIa, history of skin cancer   Clinical Impression   Pt resides with her daughter and son in law. She ambulates with a RW and can self feed, participate in grooming. Her daughter helps with bathing, dressing and all IADLs. Pt presents with impaired cognition, likely baseline, tremors, generalized weakness and decreased standing balance. She needs min to total assist for ADLs and min assist to ambulate in room. Family in meeting with palliative care during evaluation. Recommending SNF, per PT note, daughter is having difficulty caring for pt at home.      Recommendations for follow up therapy are one component of a multi-disciplinary discharge planning process, led by the attending physician.  Recommendations may be updated based on patient status, additional functional criteria and insurance authorization.   Follow Up Recommendations  Skilled nursing-short term rehab (<3 hours/day)    Assistance Recommended at Discharge Frequent or constant Supervision/Assistance  Patient can return home with the following A little help with walking and/or transfers;A lot of help with bathing/dressing/bathroom;Assistance with cooking/housework;Direct supervision/assist for medications management;Direct supervision/assist for financial management;Assist for  transportation;Help with stairs or ramp for entrance    Functional Status Assessment  Patient has had a recent decline in their functional status and/or demonstrates limited ability to make significant improvements in function in a reasonable and predictable amount of time  Equipment Recommendations  Other (comment) (defer to next venue)    Recommendations for Other Services       Precautions / Restrictions Precautions Precautions: Fall      Mobility Bed Mobility Overal bed mobility: Needs Assistance Bed Mobility: Supine to Sit, Sit to Supine     Supine to sit: Supervision Sit to supine: Supervision   General bed mobility comments: no assist    Transfers Overall transfer level: Needs assistance Equipment used: 1 person hand held assist Transfers: Sit to/from Stand Sit to Stand: Min assist           General transfer comment: steadying assist      Balance Overall balance assessment: Needs assistance   Sitting balance-Leahy Scale: Fair       Standing balance-Leahy Scale: Poor                             ADL either performed or assessed with clinical judgement   ADL Overall ADL's : Needs assistance/impaired Eating/Feeding: Set up;Sitting   Grooming: Wash/dry hands;Wash/dry face;Sitting;Set up;Brushing hair;Maximal assistance Grooming Details (indicate cue type and reason): decreased thoroughness, impaired attention to task Upper Body Bathing: Maximal assistance;Sitting   Lower Body Bathing: Total assistance;Sit to/from stand   Upper Body Dressing : Minimal assistance;Sitting   Lower Body Dressing: Maximal assistance;Sit to/from stand   Toilet Transfer: Minimal assistance;Stand-pivot;BSC/3in1   Toileting- Clothing Manipulation and Hygiene: Total assistance;Sit to/from stand       Functional mobility during ADLs: Minimal assistance (hand held assist around bed to chair)  Vision Baseline Vision/History: 1 Wears glasses Ability to  See in Adequate Light: 0 Adequate Patient Visual Report: No change from baseline       Perception     Praxis      Pertinent Vitals/Pain Pain Assessment Pain Assessment: Faces Faces Pain Scale: No hurt     Hand Dominance Right   Extremity/Trunk Assessment Upper Extremity Assessment Upper Extremity Assessment: Generalized weakness   Lower Extremity Assessment Lower Extremity Assessment: Defer to PT evaluation   Cervical / Trunk Assessment Cervical / Trunk Assessment: Kyphotic   Communication Communication Communication: HOH   Cognition Arousal/Alertness: Awake/alert Behavior During Therapy: Impulsive, Restless Overall Cognitive Status: No family/caregiver present to determine baseline cognitive functioning                                 General Comments: pt with poor awareness of safety, disoriented, much of conversation in nonsensical, pt with known dementia     General Comments       Exercises     Shoulder Instructions      Home Living Family/patient expects to be discharged to:: Private residence Living Arrangements: Children Available Help at Discharge: Family;Available 24 hours/day Type of Home: House Home Access: Stairs to enter [employer] of Steps: 8 Entrance Stairs-Rails: Left Home Layout: Multi-level;Able to live on main level with bedroom/bathroom     Bathroom Shower/Tub: Teacher, early years/pre: Standard     Home Equipment: Conservation officer, nature (2 wheels);Shower seat;BSC/3in1   Additional Comments: Home Living information gleaned from PT note, pt unable to report.      Prior Functioning/Environment Prior Level of Function : Needs assist             Mobility Comments: walks with RW ADLs Comments: Daughter assist with bathing; shower set in tub/shower combo        OT Problem List: Decreased strength;Impaired balance (sitting and/or standing);Decreased cognition;Decreased knowledge of use of DME or  AE;Decreased safety awareness      OT Treatment/Interventions: Self-care/ADL training;Therapeutic activities;Patient/family education;Balance training    OT Goals(Current goals can be found in the care plan section) Acute Rehab OT Goals OT Goal Formulation: Patient unable to participate in goal setting Time For Goal Achievement: 12/14/21 Potential to Achieve Goals: Fair ADL Goals Pt Will Perform Grooming: with min assist;standing Pt Will Perform Upper Body Dressing: with supervision;sitting Pt Will Transfer to Toilet: with supervision;ambulating;bedside commode Pt Will Perform Toileting - Clothing Manipulation and hygiene: with min assist;sit to/from stand  OT Frequency: Min 2X/week    Co-evaluation              AM-PAC OT "6 Clicks" Daily Activity     Outcome Measure Help from another person eating meals?: A Little Help from another person taking care of personal grooming?: A Lot Help from another person toileting, which includes using toliet, bedpan, or urinal?: Total Help from another person bathing (including washing, rinsing, drying)?: A Lot Help from another person to put on and taking off regular upper body clothing?: A Little Help from another person to put on and taking off regular lower body clothing?: Total 6 Click Score: 12   End of Session    Activity Tolerance: Patient tolerated treatment well Patient left: in chair;with call bell/phone within reach;with chair alarm set  OT Visit Diagnosis: Unsteadiness on feet (R26.81);Other abnormalities of gait and mobility (R26.89);Muscle weakness (generalized) (M62.81);Other symptoms and signs involving cognitive function  Time: 2951-8841 OT Time Calculation (min): 23 min Charges:  OT General Charges $OT Visit: 1 Visit OT Evaluation $OT Eval Moderate Complexity: 1 Mod OT Treatments $Self Care/Home Management : 8-22 mins Cleta Alberts, OTR/L Acute Rehabilitation Services Office: [phone]   Malka So 11/30/2021, 10:46 AM

## 2021-11-30 NOTE — Progress Notes (Signed)
PROGRESS NOTE  Nicole Downs  ZOX:[id number] DOB: 1923/11/08 DOA: 11/28/2021 PCP: Pleas Koch, NP   Brief Narrative: Patient is a 88 year old female with history of dementia, tremor, recurrent UTI, history of ESBL UTI, glaucoma, hypertension, hypothyroidism, CKD stage IIIa, skin cancer who was brought from home after she fell.  She was also having poor appetite, increased somnolence.  She just finished 7 days course of antibiotics for UTI.  Urine culture sent here showed possible ESBL UTI, she was started on meropenem.  Palliative care consulted for goals of care regarding advanced age, multiple comorbidities, recurrent problem.  After  goals of care discussion, family decided to start on full comfort care.  Plan is to send her to home with hospice tomorrow.  Assessment & Plan:  Principal Problem:   UTI (urinary tract infection) Active Problems:   AKI (acute kidney injury) ([place])   Acute metabolic encephalopathy   Hypercalcemia   Physical deconditioning   Failure to thrive in adult   Pulmonary fibrosis (H[facility])   Hiatal hernia   Pressure injury of skin   Recurrent UTI: She was just treated for UTI and completed 7 days course of antibiotics.  Presented with fall this time.  Urine culture again showing possible ESBL infection.  Initially started on meropenem.  Now on full comfort care.  AKI on CKD stage IIIa: Stop IV fluids, on comfort care  Acute metabolic encephalopathy: Has underlying dementia.  Reported increased somnolence.  Currently on comfort care.  CT head negative for acute findings  Other problems: Hypercalcemia, large hiatal hernia, constipation, depression, glaucoma, hypertension, hypothyroidism, coronary  artery disease          Nutrition Problem: Moderate Malnutrition Etiology: chronic illness (recurrent UTI, dementia) Pressure Injury 11/29/21 Sacrum Posterior;Lower Stage 1 -  Intact skin with non-blanchable redness of a localized area usually over a bony  prominence. Non-blanchable pink area (Active)  11/29/21 0215  Location: Sacrum  Location Orientation: Posterior;Lower  Staging: Stage 1 -  Intact skin with non-blanchable redness of a localized area usually over a bony prominence.  Wound Description (Comments): Non-blanchable pink area  Present on Admission: Yes  Dressing Type Foam - Lift dressing to assess site every shift 11/30/21 0308    DVT prophylaxis:  None     Code Status: DNR  Family Communication: Discussed with daughter on phone on 6/14  Patient status: Inpatient  Patient is from : Home  Anticipated discharge to: Home with hospice tomorrow  Estimated DC date: Tomorrow   Consultants: Palliative care  Procedures: None  Antimicrobials:  Anti-infectives (From admission, onward)    Start     Dose/Rate Route Frequency Ordered Stop   11/29/21 2200  ceFEPIme (MAXIPIME) 1 g in sodium chloride 0.9 % 100 mL IVPB  Status:  Discontinued        1 g 200 mL/hr over 30 Minutes Intravenous Every 24 hours 11/29/21 0003 11/29/21 0945   11/29/21 1045  meropenem (MERREM) 500 mg in sodium chloride 0.9 % 100 mL IVPB  Status:  Discontinued        500 mg 200 mL/hr over 30 Minutes Intravenous Every 12 hours 11/29/21 0946 11/30/21 1116   11/28/21 2100  ceFEPIme (MAXIPIME) 2 g in sodium chloride 0.9 % 100 mL IVPB        2 g 200 mL/hr over 30 Minutes Intravenous  Once 11/28/21 2058 11/28/21 2234       Subjective: Patient seen and examined at the bedside this morning.  Hemodynamically stable during my evaluation.  She was sitting on the chair.  She has been transitioned to comfort care.  She looks comfortable during my evaluation, talks, obeys commands but not oriented.  Not in any Distress.  Objective: Vitals:   11/29/21 0935 11/29/21 2257 11/30/21 0536 11/30/21 0948  BP: (!) 158/71 (!) 150/73 (!) 175/81 (!) 139/97  Pulse: 74 67 77 100  Resp: '18 16 18 17  '$ Temp: 98 F (36.7 C) 97.9 F (36.6 C) 98 F (36.7 C) 98.3 F (36.8 C)   TempSrc: Oral Oral Oral   SpO2: 96% 96% 98% 99%  Weight:        Intake/Output Summary (Last 24 hours) at 11/30/2021 1117 Last data filed at 11/30/2021 0536 Gross per 24 hour  Intake 762.45 ml  Output 1002 ml  Net -239.55 ml   Filed Weights   11/29/21 0122  Weight: 49 kg    Examination:  General exam: Overall comfortable, not in distress, pleasantly confused elderly female HEENT: PERRL Respiratory system:  no wheezes or crackles  Cardiovascular system: S1 & S2 heard, RRR.  Gastrointestinal system: Abdomen is nondistended, soft and nontender. Central nervous system: Alert and awake but not oriented Extremities: No edema, no clubbing ,no cyanosis Skin: No rashes, no ulcers,no icterus     Data Reviewed: I have personally reviewed following labs and imaging studies  CBC: Recent Labs  Lab 11/28/21 1400 11/29/21 0427 11/30/21 0217  WBC 10.7* 13.7* 10.6*  NEUTROABS 7.4  --  6.3  HGB 12.2 12.2 10.6*  HCT 37.2 35.9* 30.9*  MCV 95.4 94.5 92.8  PLT 259 268 127   Basic Metabolic Panel: Recent Labs  Lab 11/28/21 1400 11/29/21 0427 11/30/21 0217  NA 139 138 139  K 4.2 3.3* 4.0  CL 104 101 107  CO2 '25 27 24  '$ GLUCOSE 99 97 83  BUN 28* 25* 20  CREATININE 1.54* 1.52* 1.31*  CALCIUM 12.3* 11.5* 10.4*  MG  --   --  1.6*  PHOS  --   --  2.0*     Recent Results (from the past 240 hour(s))  Urine Culture     Status: Abnormal   Collection Time: 11/28/21  8:42 PM   Specimen: Urine, Clean Catch  Result Value Ref Range Status   Specimen Description URINE, CLEAN CATCH  Final   Special Requests   Final    NONE Performed at [facility], [place] [address] [place], [place] [address]    Culture (A)  Final    >=100,000 COLONIES/mL ESCHERICHIA COLI Confirmed Extended Spectrum Beta-Lactamase Producer (ESBL).  In bloodstream infections from ESBL organisms, carbapenems are preferred over piperacillin/tazobactam. They are shown to have a lower risk of mortality.    Report  Status 11/30/2021 FINAL  Final   Organism ID, Bacteria ESCHERICHIA COLI (A)  Final      Susceptibility   Escherichia coli - MIC*    AMPICILLIN >=32 RESISTANT Resistant     CEFAZOLIN >=64 RESISTANT Resistant     CEFEPIME 16 RESISTANT Resistant     CEFTRIAXONE >=64 RESISTANT Resistant     CIPROFLOXACIN >=4 RESISTANT Resistant     GENTAMICIN >=16 RESISTANT Resistant     IMIPENEM <=0.25 SENSITIVE Sensitive     NITROFURANTOIN <=16 SENSITIVE Sensitive     TRIMETH/SULFA >=320 RESISTANT Resistant     AMPICILLIN/SULBACTAM >=32 RESISTANT Resistant     PIP/TAZO <=4 SENSITIVE Sensitive     * >=100,000 COLONIES/mL ESCHERICHIA COLI     Radiology Studies: CT CHEST ABDOMEN PELVIS WO CONTRAST  Result Date: 11/28/2021 CLINICAL DATA:  Pneumonia, poor intake, somnolence EXAM: CT CHEST, ABDOMEN AND PELVIS WITHOUT CONTRAST TECHNIQUE: Multidetector CT imaging of the chest, abdomen and pelvis was performed following the standard protocol without IV contrast. RADIATION DOSE REDUCTION: This exam was performed according to the departmental dose-optimization program which includes automated exposure control, adjustment of the mA and/or kV according to patient size and/or use of iterative reconstruction technique. COMPARISON:  05/01/2018 FINDINGS: CT CHEST FINDINGS Cardiovascular: Aortic atherosclerosis. Aortic valve calcifications. Mild cardiomegaly. Three-vessel coronary artery calcifications. No pericardial effusion. Mediastinum/Nodes: No enlarged mediastinal, hilar, or axillary lymph nodes. Large hiatal hernia with intrathoracic position of the gastric body and fundus. Thyroid gland, trachea, and esophagus demonstrate no significant findings. Lungs/Pleura: Examination of the lungs is limited by extensive breath motion artifact. Within this limitation, there is moderate pulmonary fibrosis in a pattern with apical to basal gradient, featuring irregular peripheral interstitial opacity and septal thickening, likely with  areas of subpleural bronchiolectasis at the lung bases although poorly evaluated. No pleural effusion or pneumothorax. Musculoskeletal: No chest wall mass or suspicious osseous lesions identified. CT ABDOMEN PELVIS FINDINGS Hepatobiliary: No solid liver abnormality is seen. Distended gallbladder, similar in appearance to prior examination (series 3, image 63). No gallstones, gallbladder wall thickening, or biliary dilatation. Pancreas: Unremarkable. No pancreatic ductal dilatation or surrounding inflammatory changes. Spleen: Normal in size without significant abnormality. Adrenals/Urinary Tract: Adrenal glands are unremarkable. Kidneys are normal, without renal calculi, solid lesion, or hydronephrosis. Bladder is unremarkable. Stomach/Bowel: Stomach is within normal limits. Appendix is not clearly visualized. No evidence of bowel wall thickening, distention, or inflammatory changes. Descending and sigmoid diverticulosis. Moderate burden of stool throughout the colon and rectum, with stool balls in the rectum measuring up to 6.3 cm. Vascular/Lymphatic: Aortic atherosclerosis. No enlarged abdominal or pelvic lymph nodes. Reproductive: No mass or other abnormality. Other: No abdominal wall hernia or abnormality. No ascites. Musculoskeletal: No acute osseous findings. Gentle levoscoliosis of the thoracolumbar spine. IMPRESSION: 1. No definite acute noncontrast CT findings of the chest, abdomen, or pelvis. 2. Distended gallbladder, similar in appearance to prior examination dated 2019, without calculi, gallbladder wall thickening, pericholecystic fluid, or biliary ductal dilatation. 3. Examination of the lungs is limited by extensive breath motion artifact. Within this limitation, there is moderate pulmonary fibrosis in a pattern with apical to basal gradient, featuring irregular peripheral interstitial opacity and septal thickening, likely with areas of subpleural bronchiolectasis at the lung bases although poorly  evaluated. Findings are most consistent with a probable UIP pattern of fibrosis by ATS criteria. 4. Large hiatal hernia with intrathoracic position of the gastric body and fundus. 5. Moderate burden of stool throughout the colon and rectum, with stool balls in the rectum. Correlate for fecal impaction. 6. Coronary artery disease. 7. Aortic valve calcifications. Correlate for echocardiographic evidence of aortic valve dysfunction. Aortic Atherosclerosis (ICD10-I70.0). Electronically Signed   By: Delanna Ahmadi M.D.   On: 11/28/2021 17:03   CT Cervical Spine Wo Contrast  Result Date: 11/28/2021 CLINICAL DATA:  Neck injury. EXAM: CT HEAD WITHOUT CONTRAST CT CERVICAL SPINE WITHOUT CONTRAST TECHNIQUE: Multidetector CT imaging of the head and cervical spine was performed following the standard protocol without intravenous contrast. Multiplanar CT image reconstructions of the cervical spine were also generated. RADIATION DOSE REDUCTION: This exam was performed according to the departmental dose-optimization program which includes automated exposure control, adjustment of the mA and/or kV according to patient size and/or use of iterative reconstruction technique. COMPARISON:  April 06, 2021. FINDINGS: CT HEAD  FINDINGS Brain: Mild chronic ischemic white matter disease is noted. No mass effect or midline shift is noted. Ventricular size is within normal limits. There is no evidence of mass lesion, hemorrhage or acute infarction. Vascular: No hyperdense vessel or unexpected calcification. Skull: Normal. Negative for fracture or focal lesion. Sinuses/Orbits: No acute finding. Other: None. CT CERVICAL SPINE FINDINGS Alignment: Grade 1 anterolisthesis of C3-4 and C4-5 and C7-T1 is noted secondary to posterior facet joint hypertrophy. Mild grade 1 retrolisthesis of C5-6 is noted secondary to severe degenerative disc disease at this level. Skull base and vertebrae: No acute fracture. No primary bone lesion or focal pathologic  process. Soft tissues and spinal canal: No prevertebral fluid or swelling. No visible canal hematoma. Disc levels: Severe degenerative disc disease is noted at C5-6 and C6-7. Upper chest: No acute abnormality is noted. Other: None. IMPRESSION: No acute intracranial abnormality seen. Severe multilevel degenerative disc disease is noted in the cervical spine. No acute abnormality is noted. Electronically Signed   By: Marijo Conception M.D.   On: 11/28/2021 15:44   CT Head Wo Contrast  Result Date: 11/28/2021 CLINICAL DATA:  Neck injury. EXAM: CT HEAD WITHOUT CONTRAST CT CERVICAL SPINE WITHOUT CONTRAST TECHNIQUE: Multidetector CT imaging of the head and cervical spine was performed following the standard protocol without intravenous contrast. Multiplanar CT image reconstructions of the cervical spine were also generated. RADIATION DOSE REDUCTION: This exam was performed according to the departmental dose-optimization program which includes automated exposure control, adjustment of the mA and/or kV according to patient size and/or use of iterative reconstruction technique. COMPARISON:  April 06, 2021. FINDINGS: CT HEAD FINDINGS Brain: Mild chronic ischemic white matter disease is noted. No mass effect or midline shift is noted. Ventricular size is within normal limits. There is no evidence of mass lesion, hemorrhage or acute infarction. Vascular: No hyperdense vessel or unexpected calcification. Skull: Normal. Negative for fracture or focal lesion. Sinuses/Orbits: No acute finding. Other: None. CT CERVICAL SPINE FINDINGS Alignment: Grade 1 anterolisthesis of C3-4 and C4-5 and C7-T1 is noted secondary to posterior facet joint hypertrophy. Mild grade 1 retrolisthesis of C5-6 is noted secondary to severe degenerative disc disease at this level. Skull base and vertebrae: No acute fracture. No primary bone lesion or focal pathologic process. Soft tissues and spinal canal: No prevertebral fluid or swelling. No visible  canal hematoma. Disc levels: Severe degenerative disc disease is noted at C5-6 and C6-7. Upper chest: No acute abnormality is noted. Other: None. IMPRESSION: No acute intracranial abnormality seen. Severe multilevel degenerative disc disease is noted in the cervical spine. No acute abnormality is noted. Electronically Signed   By: Marijo Conception M.D.   On: 11/28/2021 15:44   DG Chest 2 View  Result Date: 11/28/2021 CLINICAL DATA:  Fall EXAM: CHEST - 2 VIEW COMPARISON:  04/15/2021 FINDINGS: Chronic interstitial changes. Suspect some superimposed interstitial opacities. No pleural effusion or pneumothorax. Normal heart size. Hiatal hernia. Osseous demineralization with multiple compression fractures as seen previously. IMPRESSION: Few interstitial opacities superimposed on chronic changes may reflect edema or viral/atypical pneumonia in the appropriate setting. Electronically Signed   By: Macy Mis M.D.   On: 11/28/2021 14:47    Scheduled Meds:  hydrALAZINE  25 mg Oral Q8H   levobunolol  1 drop Both Eyes BID   levothyroxine  25 mcg Oral QAC breakfast   sertraline  25 mg Oral QHS   Continuous Infusions:  magnesium sulfate bolus IVPB       LOS: 1  day   Shelly Coss, MD [facility] P6/14/2023, 11:17 AM

## 2021-12-01 DIAGNOSIS — N390 Urinary tract infection, site not specified: Secondary | ICD-10-CM | POA: Diagnosis not present

## 2021-12-01 MED ORDER — HYDRALAZINE HCL 25 MG PO TABS: 25.0000 mg | ORAL_TABLET | Freq: Three times a day (TID) | ORAL | 0 refills | Status: DC

## 2021-12-01 NOTE — Discharge Summary (Signed)
Physician Discharge Summary  Taffie Eckmann YJE:[id number] DOB: Feb 23, 1924 DOA: 11/28/2021  PCP: Pleas Koch, NP  Admit date: 11/28/2021 Discharge date: 12/01/2021  Admitted From: Home Disposition:  Home  Discharge Condition:Stable CODE STATUS:DNR Diet recommendation: Regular  Brief/Interim Summary:  Patient is a 88 year old female with history of dementia, tremor, recurrent UTI, history of ESBL UTI, glaucoma, hypertension, hypothyroidism, CKD stage IIIa, skin cancer who was brought from home after she fell.  She was also having poor appetite, increased somnolence.  She just finished 7 days course of antibiotics for UTI.  Urine culture sent here showed possible ESBL UTI, she was started on meropenem.  Palliative care consulted for goals of care regarding advanced age, multiple comorbidities, recurrent problem.  After  goals of care discussion, family decided to start on full comfort care.  Plan is to send her to home with hospice today.  Following problems were addressed during her hospitalization:  Recurrent UTI: She was just treated for UTI and completed 7 days course of antibiotics.  Presented with fall this time.  Urine culture again showing possible ESBL infection.  Initially started on meropenem.  Now on full comfort care.   AKI on CKD stage IIIa: Stopped IV fluids, on comfort care   Acute metabolic encephalopathy: Has underlying dementia.  Reported increased somnolence.  Currently on comfort care.  CT head negative for acute findings   Other problems: Hypercalcemia, large hiatal hernia, constipation, depression, glaucoma, hypertension, hypothyroidism, coronary  artery disease  Discharge Diagnoses:  Principal Problem:   UTI (urinary tract infection) Active Problems:   AKI (acute kidney injury) ([place])   Acute metabolic encephalopathy   Hypercalcemia   Physical deconditioning   Failure to thrive in adult   Pulmonary fibrosis (HCC)   Hiatal hernia   Pressure injury of  skin    Discharge Instructions  Discharge Instructions     Diet - low sodium heart healthy   Complete by: As directed    Discharge instructions   Complete by: As directed    Please follow up with Hospice at home   Increase activity slowly   Complete by: As directed    No wound care   Complete by: As directed       Allergies as of 12/01/2021       Reactions   Augmentin [amoxicillin-pot Clavulanate] Rash   Amlodipine Other (See Comments)   Ankle edema        Medication List     STOP taking these medications    GLUCOSAMINE-CHONDROITIN PO   losartan 25 MG tablet Commonly known as: COZAAR   nitrofurantoin (macrocrystal-monohydrate) 100 MG capsule Commonly known as: MACROBID   Vitamin D3 250 MCG (10000 UT) Tabs       TAKE these medications    busPIRone 5 MG tablet Commonly known as: BUSPAR TAKE 1 TABLET (5 MG TOTAL) BY MOUTH 2 (TWO) TIMES DAILY. AS NEEDED FOR ANXIETY. What changed:  when to take this reasons to take this additional instructions   hydrALAZINE 25 MG tablet Commonly known as: APRESOLINE Take 1 tablet (25 mg total) by mouth every 8 (eight) hours.   levobunolol 0.5 % ophthalmic solution Commonly known as: BETAGAN Place 1 drop into both eyes 2 (two) times daily.   levothyroxine 25 MCG tablet Commonly known as: SYNTHROID TAKE 1 TABLET BY MOUTH EVERY MORNING ON AN EMPTY STOMACH WITH WATER ONLY. NO FOOD OR OTHER MEDICATIONS FOR 30 MINUTES. OFFICE VISIT REQUIRED FOR FURTHER REFILLS. What changed: See the new instructions.  melatonin 1 MG Tabs tablet Take 1 mg by mouth at bedtime.   memantine 5 MG tablet Commonly known as: NAMENDA Take 1 tablet by mouth every morning and 2 tablets by mouth every evening for memory and behavior What changed:  how much to take how to take this when to take this additional instructions   REFRESH TEARS OP Place 1 drop into both eyes in the morning and at bedtime.   sertraline 25 MG tablet Commonly  known as: ZOLOFT TAKE 1 TABLET BY MOUTH AT BEDTIME FOR DEPRESSION What changed: See the new instructions.   TYLENOL PO Take 1 tablet by mouth at bedtime.        Allergies  Allergen Reactions   Augmentin [Amoxicillin-Pot Clavulanate] Rash   Amlodipine Other (See Comments)    Ankle edema    Consultations: Palliative care   Procedures/Studies: CT CHEST ABDOMEN PELVIS WO CONTRAST  Result Date: 11/28/2021 CLINICAL DATA:  Pneumonia, poor intake, somnolence EXAM: CT CHEST, ABDOMEN AND PELVIS WITHOUT CONTRAST TECHNIQUE: Multidetector CT imaging of the chest, abdomen and pelvis was performed following the standard protocol without IV contrast. RADIATION DOSE REDUCTION: This exam was performed according to the departmental dose-optimization program which includes automated exposure control, adjustment of the mA and/or kV according to patient size and/or use of iterative reconstruction technique. COMPARISON:  05/01/2018 FINDINGS: CT CHEST FINDINGS Cardiovascular: Aortic atherosclerosis. Aortic valve calcifications. Mild cardiomegaly. Three-vessel coronary artery calcifications. No pericardial effusion. Mediastinum/Nodes: No enlarged mediastinal, hilar, or axillary lymph nodes. Large hiatal hernia with intrathoracic position of the gastric body and fundus. Thyroid gland, trachea, and esophagus demonstrate no significant findings. Lungs/Pleura: Examination of the lungs is limited by extensive breath motion artifact. Within this limitation, there is moderate pulmonary fibrosis in a pattern with apical to basal gradient, featuring irregular peripheral interstitial opacity and septal thickening, likely with areas of subpleural bronchiolectasis at the lung bases although poorly evaluated. No pleural effusion or pneumothorax. Musculoskeletal: No chest wall mass or suspicious osseous lesions identified. CT ABDOMEN PELVIS FINDINGS Hepatobiliary: No solid liver abnormality is seen. Distended gallbladder, similar  in appearance to prior examination (series 3, image 63). No gallstones, gallbladder wall thickening, or biliary dilatation. Pancreas: Unremarkable. No pancreatic ductal dilatation or surrounding inflammatory changes. Spleen: Normal in size without significant abnormality. Adrenals/Urinary Tract: Adrenal glands are unremarkable. Kidneys are normal, without renal calculi, solid lesion, or hydronephrosis. Bladder is unremarkable. Stomach/Bowel: Stomach is within normal limits. Appendix is not clearly visualized. No evidence of bowel wall thickening, distention, or inflammatory changes. Descending and sigmoid diverticulosis. Moderate burden of stool throughout the colon and rectum, with stool balls in the rectum measuring up to 6.3 cm. Vascular/Lymphatic: Aortic atherosclerosis. No enlarged abdominal or pelvic lymph nodes. Reproductive: No mass or other abnormality. Other: No abdominal wall hernia or abnormality. No ascites. Musculoskeletal: No acute osseous findings. Gentle levoscoliosis of the thoracolumbar spine. IMPRESSION: 1. No definite acute noncontrast CT findings of the chest, abdomen, or pelvis. 2. Distended gallbladder, similar in appearance to prior examination dated 2019, without calculi, gallbladder wall thickening, pericholecystic fluid, or biliary ductal dilatation. 3. Examination of the lungs is limited by extensive breath motion artifact. Within this limitation, there is moderate pulmonary fibrosis in a pattern with apical to basal gradient, featuring irregular peripheral interstitial opacity and septal thickening, likely with areas of subpleural bronchiolectasis at the lung bases although poorly evaluated. Findings are most consistent with a probable UIP pattern of fibrosis by ATS criteria. 4. Large hiatal hernia with intrathoracic position of the gastric  body and fundus. 5. Moderate burden of stool throughout the colon and rectum, with stool balls in the rectum. Correlate for fecal impaction. 6.  Coronary artery disease. 7. Aortic valve calcifications. Correlate for echocardiographic evidence of aortic valve dysfunction. Aortic Atherosclerosis (ICD10-I70.0). Electronically Signed   By: Delanna Ahmadi M.D.   On: 11/28/2021 17:03   CT Cervical Spine Wo Contrast  Result Date: 11/28/2021 CLINICAL DATA:  Neck injury. EXAM: CT HEAD WITHOUT CONTRAST CT CERVICAL SPINE WITHOUT CONTRAST TECHNIQUE: Multidetector CT imaging of the head and cervical spine was performed following the standard protocol without intravenous contrast. Multiplanar CT image reconstructions of the cervical spine were also generated. RADIATION DOSE REDUCTION: This exam was performed according to the departmental dose-optimization program which includes automated exposure control, adjustment of the mA and/or kV according to patient size and/or use of iterative reconstruction technique. COMPARISON:  April 06, 2021. FINDINGS: CT HEAD FINDINGS Brain: Mild chronic ischemic white matter disease is noted. No mass effect or midline shift is noted. Ventricular size is within normal limits. There is no evidence of mass lesion, hemorrhage or acute infarction. Vascular: No hyperdense vessel or unexpected calcification. Skull: Normal. Negative for fracture or focal lesion. Sinuses/Orbits: No acute finding. Other: None. CT CERVICAL SPINE FINDINGS Alignment: Grade 1 anterolisthesis of C3-4 and C4-5 and C7-T1 is noted secondary to posterior facet joint hypertrophy. Mild grade 1 retrolisthesis of C5-6 is noted secondary to severe degenerative disc disease at this level. Skull base and vertebrae: No acute fracture. No primary bone lesion or focal pathologic process. Soft tissues and spinal canal: No prevertebral fluid or swelling. No visible canal hematoma. Disc levels: Severe degenerative disc disease is noted at C5-6 and C6-7. Upper chest: No acute abnormality is noted. Other: None. IMPRESSION: No acute intracranial abnormality seen. Severe multilevel  degenerative disc disease is noted in the cervical spine. No acute abnormality is noted. Electronically Signed   By: Marijo Conception M.D.   On: 11/28/2021 15:44   CT Head Wo Contrast  Result Date: 11/28/2021 CLINICAL DATA:  Neck injury. EXAM: CT HEAD WITHOUT CONTRAST CT CERVICAL SPINE WITHOUT CONTRAST TECHNIQUE: Multidetector CT imaging of the head and cervical spine was performed following the standard protocol without intravenous contrast. Multiplanar CT image reconstructions of the cervical spine were also generated. RADIATION DOSE REDUCTION: This exam was performed according to the departmental dose-optimization program which includes automated exposure control, adjustment of the mA and/or kV according to patient size and/or use of iterative reconstruction technique. COMPARISON:  April 06, 2021. FINDINGS: CT HEAD FINDINGS Brain: Mild chronic ischemic white matter disease is noted. No mass effect or midline shift is noted. Ventricular size is within normal limits. There is no evidence of mass lesion, hemorrhage or acute infarction. Vascular: No hyperdense vessel or unexpected calcification. Skull: Normal. Negative for fracture or focal lesion. Sinuses/Orbits: No acute finding. Other: None. CT CERVICAL SPINE FINDINGS Alignment: Grade 1 anterolisthesis of C3-4 and C4-5 and C7-T1 is noted secondary to posterior facet joint hypertrophy. Mild grade 1 retrolisthesis of C5-6 is noted secondary to severe degenerative disc disease at this level. Skull base and vertebrae: No acute fracture. No primary bone lesion or focal pathologic process. Soft tissues and spinal canal: No prevertebral fluid or swelling. No visible canal hematoma. Disc levels: Severe degenerative disc disease is noted at C5-6 and C6-7. Upper chest: No acute abnormality is noted. Other: None. IMPRESSION: No acute intracranial abnormality seen. Severe multilevel degenerative disc disease is noted in the cervical spine. No acute abnormality  is noted.  Electronically Signed   By: Marijo Conception M.D.   On: 11/28/2021 15:44   DG Chest 2 View  Result Date: 11/28/2021 CLINICAL DATA:  Fall EXAM: CHEST - 2 VIEW COMPARISON:  04/15/2021 FINDINGS: Chronic interstitial changes. Suspect some superimposed interstitial opacities. No pleural effusion or pneumothorax. Normal heart size. Hiatal hernia. Osseous demineralization with multiple compression fractures as seen previously. IMPRESSION: Few interstitial opacities superimposed on chronic changes may reflect edema or viral/atypical pneumonia in the appropriate setting. Electronically Signed   By: Macy Mis M.D.   On: 11/28/2021 14:47      Subjective: Patient seen and examined at the bedside this morning.  She was sleeping during my evaluation and did not wake up on calling her name.  Sitter at the bedside.  Plan to send her home with hospice today.  Discharge Exam: Vitals:   11/30/21 0536 11/30/21 0948  BP: (!) 175/81 (!) 139/97  Pulse: 77 100  Resp: 18 17  Temp: 98 F (36.7 C) 98.3 F (36.8 C)  SpO2: 98% 99%   Vitals:   11/29/21 0935 11/29/21 2257 11/30/21 0536 11/30/21 0948  BP: (!) 158/71 (!) 150/73 (!) 175/81 (!) 139/97  Pulse: 74 67 77 100  Resp: '18 16 18 17  '$ Temp: 98 F (36.7 C) 97.9 F (36.6 C) 98 F (36.7 C) 98.3 F (36.8 C)  TempSrc: Oral Oral Oral   SpO2: 96% 96% 98% 99%  Weight:        General: Pt is sleeping Cardiovascular: RRR, S1/S2 +, no rubs, no gallops Respiratory: CTA bilaterally, no wheezing, no rhonchi Abdominal: Soft, NT, ND, bowel sounds + Extremities: no edema, no cyanosis    The results of significant diagnostics from this hospitalization (including imaging, microbiology, ancillary and laboratory) are listed below for reference.     Microbiology: Recent Results (from the past 240 hour(s))  Urine Culture     Status: Abnormal   Collection Time: 11/28/21  8:42 PM   Specimen: Urine, Clean Catch  Result Value Ref Range Status   Specimen  Description URINE, CLEAN CATCH  Final   Special Requests   Final    NONE Performed at [facility], [place] [address], [place], [place] [address]    Culture (A)  Final    >=100,000 COLONIES/mL ESCHERICHIA COLI Confirmed Extended Spectrum Beta-Lactamase Producer (ESBL).  In bloodstream infections from ESBL organisms, carbapenems are preferred over piperacillin/tazobactam. They are shown to have a lower risk of mortality.    Report Status 11/30/2021 FINAL  Final   Organism ID, Bacteria ESCHERICHIA COLI (A)  Final      Susceptibility   Escherichia coli - MIC*    AMPICILLIN >=32 RESISTANT Resistant     CEFAZOLIN >=64 RESISTANT Resistant     CEFEPIME 16 RESISTANT Resistant     CEFTRIAXONE >=64 RESISTANT Resistant     CIPROFLOXACIN >=4 RESISTANT Resistant     GENTAMICIN >=16 RESISTANT Resistant     IMIPENEM <=0.25 SENSITIVE Sensitive     NITROFURANTOIN <=16 SENSITIVE Sensitive     TRIMETH/SULFA >=320 RESISTANT Resistant     AMPICILLIN/SULBACTAM >=32 RESISTANT Resistant     PIP/TAZO <=4 SENSITIVE Sensitive     * >=100,000 COLONIES/mL ESCHERICHIA COLI     Labs: BNP (last 3 results) No results for input(s): "BNP" in the last 8760 hours. Basic Metabolic Panel: Recent Labs  Lab 11/28/21 1400 11/29/21 0427 11/30/21 0217  NA 139 138 139  K 4.2 3.3* 4.0  CL 104 101  107  CO2 '25 27 24  '$ GLUCOSE 99 97 83  BUN 28* 25* 20  CREATININE 1.54* 1.52* 1.31*  CALCIUM 12.3* 11.5*  11.5* 10.4*  MG  --   --  1.6*  PHOS  --   --  2.0*   Liver Function Tests: Recent Labs  Lab 11/28/21 1400 11/29/21 0427  AST 19 17  ALT 10 11  ALKPHOS 69 62  BILITOT 1.0 1.3*  PROT 6.8 6.9  ALBUMIN 3.1* 3.1*   No results for input(s): "LIPASE", "AMYLASE" in the last 168 hours. No results for input(s): "AMMONIA" in the last 168 hours. CBC: Recent Labs  Lab 11/28/21 1400 11/29/21 0427 11/30/21 0217  WBC 10.7* 13.7* 10.6*  NEUTROABS 7.4  --  6.3  HGB 12.2 12.2 10.6*  HCT 37.2 35.9* 30.9*  MCV  95.4 94.5 92.8  PLT 259 268 235   Cardiac Enzymes: No results for input(s): "CKTOTAL", "CKMB", "CKMBINDEX", "TROPONINI" in the last 168 hours. BNP: Invalid input(s): "POCBNP" CBG: Recent Labs  Lab 11/28/21 1752  GLUCAP 93   D-Dimer No results for input(s): "DDIMER" in the last 72 hours. Hgb A1c No results for input(s): "HGBA1C" in the last 72 hours. Lipid Profile No results for input(s): "CHOL", "HDL", "LDLCALC", "TRIG", "CHOLHDL", "LDLDIRECT" in the last 72 hours. Thyroid function studies Recent Labs    11/28/21 1753  TSH 1.470   Anemia work up No results for input(s): "VITAMINB12", "FOLATE", "FERRITIN", "TIBC", "IRON", "RETICCTPCT" in the last 72 hours. Urinalysis    Component Value Date/Time   COLORURINE YELLOW 11/28/2021 2029   APPEARANCEUR HAZY (A) 11/28/2021 2029   LABSPEC 1.013 11/28/2021 2029   PHURINE 5.0 11/28/2021 2029   GLUCOSEU NEGATIVE 11/28/2021 2029   GLUCOSEU NEGATIVE 06/24/2015 0959   HGBUR NEGATIVE 11/28/2021 2029   BILIRUBINUR NEGATIVE 11/28/2021 2029   BILIRUBINUR negative 11/15/2021 1454   BILIRUBINUR Negative 10/27/2021 0831   KETONESUR NEGATIVE 11/28/2021 2029   PROTEINUR NEGATIVE 11/28/2021 2029   UROBILINOGEN 1.0 11/15/2021 1454   UROBILINOGEN 0.2 06/24/2015 0959   NITRITE NEGATIVE 11/28/2021 2029   LEUKOCYTESUR SMALL (A) 11/28/2021 2029   Sepsis Labs Recent Labs  Lab 11/28/21 1400 11/29/21 0427 11/30/21 0217  WBC 10.7* 13.7* 10.6*   Microbiology Recent Results (from the past 240 hour(s))  Urine Culture     Status: Abnormal   Collection Time: 11/28/21  8:42 PM   Specimen: Urine, Clean Catch  Result Value Ref Range Status   Specimen Description URINE, CLEAN CATCH  Final   Special Requests   Final    NONE Performed at [facility], [place] [address], [place], [place] [address]    Culture (A)  Final    >=100,000 COLONIES/mL ESCHERICHIA COLI Confirmed Extended Spectrum Beta-Lactamase Producer (ESBL).  In bloodstream  infections from ESBL organisms, carbapenems are preferred over piperacillin/tazobactam. They are shown to have a lower risk of mortality.    Report Status 11/30/2021 FINAL  Final   Organism ID, Bacteria ESCHERICHIA COLI (A)  Final      Susceptibility   Escherichia coli - MIC*    AMPICILLIN >=32 RESISTANT Resistant     CEFAZOLIN >=64 RESISTANT Resistant     CEFEPIME 16 RESISTANT Resistant     CEFTRIAXONE >=64 RESISTANT Resistant     CIPROFLOXACIN >=4 RESISTANT Resistant     GENTAMICIN >=16 RESISTANT Resistant     IMIPENEM <=0.25 SENSITIVE Sensitive     NITROFURANTOIN <=16 SENSITIVE Sensitive     TRIMETH/SULFA >=320 RESISTANT Resistant  AMPICILLIN/SULBACTAM >=32 RESISTANT Resistant     PIP/TAZO <=4 SENSITIVE Sensitive     * >=100,000 COLONIES/mL ESCHERICHIA COLI    Please note: You were cared for by a hospitalist during your hospital stay. Once you are discharged, your primary care physician will handle any further medical issues. Please note that NO REFILLS for any discharge medications will be authorized once you are discharged, as it is imperative that you return to your primary care physician (or establish a relationship with a primary care physician if you do not have one) for your post hospital discharge needs so that they can reassess your need for medications and monitor your lab values.    Time coordinating discharge: 40 minutes  SIGNED:   Shelly Coss, MD  [facility] 12/01/2021, 10:34 AM Pager [phone]  If 7PM-7AM, please contact night-coverage [email or web address] Password [email or web address]

## 2021-12-01 NOTE — Progress Notes (Signed)
Patient is discharging home with hospice. Patients daughter is at bedside and discharge instructions reviewed. Patients daughter verbalized a full understanding including medications. PTAR is at bedside to provide transport to her house.

## 2021-12-01 NOTE — TOC Transition Note (Signed)
Transition of Care [facility]) - CM/SW Discharge Note   Patient Details  Name: Blakeley Scheier MRN: [id number] Date of Birth: [birth year]  [facility] CM/SW Contact:  Milinda Antis, LCSWA Phone Number: 12/01/2021, 11:04 AM   Clinical Narrative:    Patient will DC to: Home with hospice Anticipated DC date: 12/01/2021 Family notified: Yes Transport by:  PTA   Per MD patient ready for DC to home. RN,  patient's family, and [facility] notified of DC.  DC packet on chart. Ambulance transport will be  requested for patient.   CSW will sign off for now as social work intervention is no longer needed. Please consult [place] again if new needs arise.     Final next level of care: Home w Hospice Care Barriers to Discharge: Barriers Resolved   Patient Goals and CMS Choice   CMS Medicare.gov Compare Post Acute Care list provided to:: Patient Represenative (must comment) Choice offered to / list presented to : Adult Children  Discharge Placement                  Name of family member notified: Talmadge Coventry (Daughter)   [phone] Patient and family notified of of transfer: 12/01/21  Discharge Plan and Services                          HH Arranged: RN [facility] Agency: [place] Date [place]: 12/01/21 Time [place]: 1040 Representative spoke with at [place]: [place] ([place]) Interventions     Readmission Risk Interventions     No data to display

## 2021-12-01 NOTE — Progress Notes (Signed)
PT Cancellation Note  Patient Details Name: Nicole Downs MRN: [id number] DOB: 18-Jul-1923   Cancelled Treatment:    Reason Eval/Treat Not Completed: Fatigue/lethargy limiting ability to participate Patient sleeping soundly. RN and daughter request to let her sleep as she had been restless and impulsive recently.   Nicole Downs A. Gilford Rile PT, DPT Acute Rehabilitation Services Office [phone]    Linna Hoff 12/01/2021, 11:08 AM

## 2021-12-02 DIAGNOSIS — E039 Hypothyroidism, unspecified: Secondary | ICD-10-CM | POA: Diagnosis not present

## 2021-12-02 DIAGNOSIS — C4490 Unspecified malignant neoplasm of skin, unspecified: Secondary | ICD-10-CM | POA: Diagnosis not present

## 2021-12-02 DIAGNOSIS — Z681 Body mass index (BMI) 19 or less, adult: Secondary | ICD-10-CM | POA: Diagnosis not present

## 2021-12-02 DIAGNOSIS — D631 Anemia in chronic kidney disease: Secondary | ICD-10-CM | POA: Diagnosis not present

## 2021-12-02 DIAGNOSIS — Z87440 Personal history of urinary (tract) infections: Secondary | ICD-10-CM | POA: Diagnosis not present

## 2021-12-02 DIAGNOSIS — K449 Diaphragmatic hernia without obstruction or gangrene: Secondary | ICD-10-CM | POA: Diagnosis not present

## 2021-12-02 DIAGNOSIS — H409 Unspecified glaucoma: Secondary | ICD-10-CM | POA: Diagnosis not present

## 2021-12-02 DIAGNOSIS — R634 Abnormal weight loss: Secondary | ICD-10-CM | POA: Diagnosis not present

## 2021-12-02 DIAGNOSIS — J439 Emphysema, unspecified: Secondary | ICD-10-CM | POA: Diagnosis not present

## 2021-12-02 DIAGNOSIS — R630 Anorexia: Secondary | ICD-10-CM | POA: Diagnosis not present

## 2021-12-02 DIAGNOSIS — J8410 Pulmonary fibrosis, unspecified: Secondary | ICD-10-CM | POA: Diagnosis not present

## 2021-12-02 DIAGNOSIS — G250 Essential tremor: Secondary | ICD-10-CM | POA: Diagnosis not present

## 2021-12-02 DIAGNOSIS — I129 Hypertensive chronic kidney disease with stage 1 through stage 4 chronic kidney disease, or unspecified chronic kidney disease: Secondary | ICD-10-CM | POA: Diagnosis not present

## 2021-12-02 DIAGNOSIS — N1831 Chronic kidney disease, stage 3a: Secondary | ICD-10-CM | POA: Diagnosis not present

## 2021-12-02 DIAGNOSIS — I739 Peripheral vascular disease, unspecified: Secondary | ICD-10-CM | POA: Diagnosis not present

## 2021-12-02 DIAGNOSIS — H04129 Dry eye syndrome of unspecified lacrimal gland: Secondary | ICD-10-CM | POA: Diagnosis not present

## 2021-12-02 DIAGNOSIS — F32A Depression, unspecified: Secondary | ICD-10-CM | POA: Diagnosis not present

## 2021-12-05 DIAGNOSIS — I739 Peripheral vascular disease, unspecified: Secondary | ICD-10-CM | POA: Diagnosis not present

## 2021-12-05 DIAGNOSIS — I129 Hypertensive chronic kidney disease with stage 1 through stage 4 chronic kidney disease, or unspecified chronic kidney disease: Secondary | ICD-10-CM | POA: Diagnosis not present

## 2021-12-05 DIAGNOSIS — R630 Anorexia: Secondary | ICD-10-CM | POA: Diagnosis not present

## 2021-12-05 DIAGNOSIS — J8410 Pulmonary fibrosis, unspecified: Secondary | ICD-10-CM | POA: Diagnosis not present

## 2021-12-05 DIAGNOSIS — N1831 Chronic kidney disease, stage 3a: Secondary | ICD-10-CM | POA: Diagnosis not present

## 2021-12-05 DIAGNOSIS — R634 Abnormal weight loss: Secondary | ICD-10-CM | POA: Diagnosis not present

## 2021-12-06 DIAGNOSIS — J8410 Pulmonary fibrosis, unspecified: Secondary | ICD-10-CM | POA: Diagnosis not present

## 2021-12-06 DIAGNOSIS — N1831 Chronic kidney disease, stage 3a: Secondary | ICD-10-CM | POA: Diagnosis not present

## 2021-12-06 DIAGNOSIS — I129 Hypertensive chronic kidney disease with stage 1 through stage 4 chronic kidney disease, or unspecified chronic kidney disease: Secondary | ICD-10-CM | POA: Diagnosis not present

## 2021-12-06 DIAGNOSIS — I739 Peripheral vascular disease, unspecified: Secondary | ICD-10-CM | POA: Diagnosis not present

## 2021-12-06 DIAGNOSIS — R634 Abnormal weight loss: Secondary | ICD-10-CM | POA: Diagnosis not present

## 2021-12-06 DIAGNOSIS — R630 Anorexia: Secondary | ICD-10-CM | POA: Diagnosis not present

## 2021-12-09 ENCOUNTER — Other Ambulatory Visit: Payer: Medicare Other

## 2021-12-09 DIAGNOSIS — R634 Abnormal weight loss: Secondary | ICD-10-CM | POA: Diagnosis not present

## 2021-12-09 DIAGNOSIS — I129 Hypertensive chronic kidney disease with stage 1 through stage 4 chronic kidney disease, or unspecified chronic kidney disease: Secondary | ICD-10-CM | POA: Diagnosis not present

## 2021-12-09 DIAGNOSIS — I739 Peripheral vascular disease, unspecified: Secondary | ICD-10-CM | POA: Diagnosis not present

## 2021-12-09 DIAGNOSIS — N1831 Chronic kidney disease, stage 3a: Secondary | ICD-10-CM | POA: Diagnosis not present

## 2021-12-09 DIAGNOSIS — R630 Anorexia: Secondary | ICD-10-CM | POA: Diagnosis not present

## 2021-12-09 DIAGNOSIS — J8410 Pulmonary fibrosis, unspecified: Secondary | ICD-10-CM | POA: Diagnosis not present

## 2021-12-13 DIAGNOSIS — N1831 Chronic kidney disease, stage 3a: Secondary | ICD-10-CM | POA: Diagnosis not present

## 2021-12-13 DIAGNOSIS — R630 Anorexia: Secondary | ICD-10-CM | POA: Diagnosis not present

## 2021-12-13 DIAGNOSIS — J8410 Pulmonary fibrosis, unspecified: Secondary | ICD-10-CM | POA: Diagnosis not present

## 2021-12-13 DIAGNOSIS — R634 Abnormal weight loss: Secondary | ICD-10-CM | POA: Diagnosis not present

## 2021-12-13 DIAGNOSIS — I129 Hypertensive chronic kidney disease with stage 1 through stage 4 chronic kidney disease, or unspecified chronic kidney disease: Secondary | ICD-10-CM | POA: Diagnosis not present

## 2021-12-13 DIAGNOSIS — I739 Peripheral vascular disease, unspecified: Secondary | ICD-10-CM | POA: Diagnosis not present

## 2021-12-14 DIAGNOSIS — I129 Hypertensive chronic kidney disease with stage 1 through stage 4 chronic kidney disease, or unspecified chronic kidney disease: Secondary | ICD-10-CM | POA: Diagnosis not present

## 2021-12-14 DIAGNOSIS — I739 Peripheral vascular disease, unspecified: Secondary | ICD-10-CM | POA: Diagnosis not present

## 2021-12-14 DIAGNOSIS — R634 Abnormal weight loss: Secondary | ICD-10-CM | POA: Diagnosis not present

## 2021-12-14 DIAGNOSIS — N1831 Chronic kidney disease, stage 3a: Secondary | ICD-10-CM | POA: Diagnosis not present

## 2021-12-14 DIAGNOSIS — R630 Anorexia: Secondary | ICD-10-CM | POA: Diagnosis not present

## 2021-12-14 DIAGNOSIS — J8410 Pulmonary fibrosis, unspecified: Secondary | ICD-10-CM | POA: Diagnosis not present

## 2021-12-15 ENCOUNTER — Other Ambulatory Visit: Payer: Medicare Other | Admitting: Student

## 2021-12-15 DIAGNOSIS — I739 Peripheral vascular disease, unspecified: Secondary | ICD-10-CM | POA: Diagnosis not present

## 2021-12-15 DIAGNOSIS — R634 Abnormal weight loss: Secondary | ICD-10-CM | POA: Diagnosis not present

## 2021-12-15 DIAGNOSIS — I129 Hypertensive chronic kidney disease with stage 1 through stage 4 chronic kidney disease, or unspecified chronic kidney disease: Secondary | ICD-10-CM | POA: Diagnosis not present

## 2021-12-15 DIAGNOSIS — R630 Anorexia: Secondary | ICD-10-CM | POA: Diagnosis not present

## 2021-12-15 DIAGNOSIS — N1831 Chronic kidney disease, stage 3a: Secondary | ICD-10-CM | POA: Diagnosis not present

## 2021-12-15 DIAGNOSIS — J8410 Pulmonary fibrosis, unspecified: Secondary | ICD-10-CM | POA: Diagnosis not present

## 2021-12-16 DIAGNOSIS — I739 Peripheral vascular disease, unspecified: Secondary | ICD-10-CM | POA: Diagnosis not present

## 2021-12-16 DIAGNOSIS — N1831 Chronic kidney disease, stage 3a: Secondary | ICD-10-CM | POA: Diagnosis not present

## 2021-12-16 DIAGNOSIS — R634 Abnormal weight loss: Secondary | ICD-10-CM | POA: Diagnosis not present

## 2021-12-16 DIAGNOSIS — I129 Hypertensive chronic kidney disease with stage 1 through stage 4 chronic kidney disease, or unspecified chronic kidney disease: Secondary | ICD-10-CM | POA: Diagnosis not present

## 2021-12-16 DIAGNOSIS — J8410 Pulmonary fibrosis, unspecified: Secondary | ICD-10-CM | POA: Diagnosis not present

## 2021-12-16 DIAGNOSIS — R630 Anorexia: Secondary | ICD-10-CM | POA: Diagnosis not present

## 2021-12-17 DIAGNOSIS — H04129 Dry eye syndrome of unspecified lacrimal gland: Secondary | ICD-10-CM | POA: Diagnosis not present

## 2021-12-17 DIAGNOSIS — Z87440 Personal history of urinary (tract) infections: Secondary | ICD-10-CM | POA: Diagnosis not present

## 2021-12-17 DIAGNOSIS — D631 Anemia in chronic kidney disease: Secondary | ICD-10-CM | POA: Diagnosis not present

## 2021-12-17 DIAGNOSIS — J8410 Pulmonary fibrosis, unspecified: Secondary | ICD-10-CM | POA: Diagnosis not present

## 2021-12-17 DIAGNOSIS — G250 Essential tremor: Secondary | ICD-10-CM | POA: Diagnosis not present

## 2021-12-17 DIAGNOSIS — N1831 Chronic kidney disease, stage 3a: Secondary | ICD-10-CM | POA: Diagnosis not present

## 2021-12-17 DIAGNOSIS — H409 Unspecified glaucoma: Secondary | ICD-10-CM | POA: Diagnosis not present

## 2021-12-17 DIAGNOSIS — J439 Emphysema, unspecified: Secondary | ICD-10-CM | POA: Diagnosis not present

## 2021-12-17 DIAGNOSIS — I129 Hypertensive chronic kidney disease with stage 1 through stage 4 chronic kidney disease, or unspecified chronic kidney disease: Secondary | ICD-10-CM | POA: Diagnosis not present

## 2021-12-17 DIAGNOSIS — Z681 Body mass index (BMI) 19 or less, adult: Secondary | ICD-10-CM | POA: Diagnosis not present

## 2021-12-17 DIAGNOSIS — K449 Diaphragmatic hernia without obstruction or gangrene: Secondary | ICD-10-CM | POA: Diagnosis not present

## 2021-12-17 DIAGNOSIS — C4490 Unspecified malignant neoplasm of skin, unspecified: Secondary | ICD-10-CM | POA: Diagnosis not present

## 2021-12-17 DIAGNOSIS — F32A Depression, unspecified: Secondary | ICD-10-CM | POA: Diagnosis not present

## 2021-12-17 DIAGNOSIS — I739 Peripheral vascular disease, unspecified: Secondary | ICD-10-CM | POA: Diagnosis not present

## 2021-12-17 DIAGNOSIS — E039 Hypothyroidism, unspecified: Secondary | ICD-10-CM | POA: Diagnosis not present

## 2021-12-17 DIAGNOSIS — R634 Abnormal weight loss: Secondary | ICD-10-CM | POA: Diagnosis not present

## 2021-12-17 DIAGNOSIS — R630 Anorexia: Secondary | ICD-10-CM | POA: Diagnosis not present

## 2021-12-19 DIAGNOSIS — N1831 Chronic kidney disease, stage 3a: Secondary | ICD-10-CM | POA: Diagnosis not present

## 2021-12-19 DIAGNOSIS — I129 Hypertensive chronic kidney disease with stage 1 through stage 4 chronic kidney disease, or unspecified chronic kidney disease: Secondary | ICD-10-CM | POA: Diagnosis not present

## 2021-12-19 DIAGNOSIS — R630 Anorexia: Secondary | ICD-10-CM | POA: Diagnosis not present

## 2021-12-19 DIAGNOSIS — I739 Peripheral vascular disease, unspecified: Secondary | ICD-10-CM | POA: Diagnosis not present

## 2021-12-19 DIAGNOSIS — R634 Abnormal weight loss: Secondary | ICD-10-CM | POA: Diagnosis not present

## 2021-12-19 DIAGNOSIS — J8410 Pulmonary fibrosis, unspecified: Secondary | ICD-10-CM | POA: Diagnosis not present

## 2021-12-20 DIAGNOSIS — J8410 Pulmonary fibrosis, unspecified: Secondary | ICD-10-CM | POA: Diagnosis not present

## 2021-12-20 DIAGNOSIS — I739 Peripheral vascular disease, unspecified: Secondary | ICD-10-CM | POA: Diagnosis not present

## 2021-12-20 DIAGNOSIS — I129 Hypertensive chronic kidney disease with stage 1 through stage 4 chronic kidney disease, or unspecified chronic kidney disease: Secondary | ICD-10-CM | POA: Diagnosis not present

## 2021-12-20 DIAGNOSIS — R630 Anorexia: Secondary | ICD-10-CM | POA: Diagnosis not present

## 2021-12-20 DIAGNOSIS — N1831 Chronic kidney disease, stage 3a: Secondary | ICD-10-CM | POA: Diagnosis not present

## 2021-12-20 DIAGNOSIS — R634 Abnormal weight loss: Secondary | ICD-10-CM | POA: Diagnosis not present

## 2021-12-23 ENCOUNTER — Other Ambulatory Visit: Payer: Medicare Other

## 2021-12-23 DIAGNOSIS — N1831 Chronic kidney disease, stage 3a: Secondary | ICD-10-CM | POA: Diagnosis not present

## 2021-12-23 DIAGNOSIS — J8410 Pulmonary fibrosis, unspecified: Secondary | ICD-10-CM | POA: Diagnosis not present

## 2021-12-23 DIAGNOSIS — I129 Hypertensive chronic kidney disease with stage 1 through stage 4 chronic kidney disease, or unspecified chronic kidney disease: Secondary | ICD-10-CM | POA: Diagnosis not present

## 2021-12-23 DIAGNOSIS — R630 Anorexia: Secondary | ICD-10-CM | POA: Diagnosis not present

## 2021-12-23 DIAGNOSIS — R634 Abnormal weight loss: Secondary | ICD-10-CM | POA: Diagnosis not present

## 2021-12-23 DIAGNOSIS — I739 Peripheral vascular disease, unspecified: Secondary | ICD-10-CM | POA: Diagnosis not present

## 2021-12-24 ENCOUNTER — Other Ambulatory Visit: Payer: Self-pay | Admitting: Primary Care

## 2021-12-24 DIAGNOSIS — N390 Urinary tract infection, site not specified: Secondary | ICD-10-CM

## 2021-12-25 DIAGNOSIS — I739 Peripheral vascular disease, unspecified: Secondary | ICD-10-CM | POA: Diagnosis not present

## 2021-12-25 DIAGNOSIS — J8410 Pulmonary fibrosis, unspecified: Secondary | ICD-10-CM | POA: Diagnosis not present

## 2021-12-25 DIAGNOSIS — R630 Anorexia: Secondary | ICD-10-CM | POA: Diagnosis not present

## 2021-12-25 DIAGNOSIS — R634 Abnormal weight loss: Secondary | ICD-10-CM | POA: Diagnosis not present

## 2021-12-25 DIAGNOSIS — I129 Hypertensive chronic kidney disease with stage 1 through stage 4 chronic kidney disease, or unspecified chronic kidney disease: Secondary | ICD-10-CM | POA: Diagnosis not present

## 2021-12-25 DIAGNOSIS — N1831 Chronic kidney disease, stage 3a: Secondary | ICD-10-CM | POA: Diagnosis not present

## 2021-12-26 DIAGNOSIS — R634 Abnormal weight loss: Secondary | ICD-10-CM | POA: Diagnosis not present

## 2021-12-26 DIAGNOSIS — J8410 Pulmonary fibrosis, unspecified: Secondary | ICD-10-CM | POA: Diagnosis not present

## 2021-12-26 DIAGNOSIS — I129 Hypertensive chronic kidney disease with stage 1 through stage 4 chronic kidney disease, or unspecified chronic kidney disease: Secondary | ICD-10-CM | POA: Diagnosis not present

## 2021-12-26 DIAGNOSIS — N1831 Chronic kidney disease, stage 3a: Secondary | ICD-10-CM | POA: Diagnosis not present

## 2021-12-26 DIAGNOSIS — R630 Anorexia: Secondary | ICD-10-CM | POA: Diagnosis not present

## 2021-12-26 DIAGNOSIS — I739 Peripheral vascular disease, unspecified: Secondary | ICD-10-CM | POA: Diagnosis not present

## 2021-12-27 DIAGNOSIS — I739 Peripheral vascular disease, unspecified: Secondary | ICD-10-CM | POA: Diagnosis not present

## 2021-12-27 DIAGNOSIS — N1831 Chronic kidney disease, stage 3a: Secondary | ICD-10-CM | POA: Diagnosis not present

## 2021-12-27 DIAGNOSIS — R634 Abnormal weight loss: Secondary | ICD-10-CM | POA: Diagnosis not present

## 2021-12-27 DIAGNOSIS — R630 Anorexia: Secondary | ICD-10-CM | POA: Diagnosis not present

## 2021-12-27 DIAGNOSIS — J8410 Pulmonary fibrosis, unspecified: Secondary | ICD-10-CM | POA: Diagnosis not present

## 2021-12-27 DIAGNOSIS — I129 Hypertensive chronic kidney disease with stage 1 through stage 4 chronic kidney disease, or unspecified chronic kidney disease: Secondary | ICD-10-CM | POA: Diagnosis not present

## 2021-12-30 DIAGNOSIS — I739 Peripheral vascular disease, unspecified: Secondary | ICD-10-CM | POA: Diagnosis not present

## 2021-12-30 DIAGNOSIS — R634 Abnormal weight loss: Secondary | ICD-10-CM | POA: Diagnosis not present

## 2021-12-30 DIAGNOSIS — J8410 Pulmonary fibrosis, unspecified: Secondary | ICD-10-CM | POA: Diagnosis not present

## 2021-12-30 DIAGNOSIS — R630 Anorexia: Secondary | ICD-10-CM | POA: Diagnosis not present

## 2021-12-30 DIAGNOSIS — I129 Hypertensive chronic kidney disease with stage 1 through stage 4 chronic kidney disease, or unspecified chronic kidney disease: Secondary | ICD-10-CM | POA: Diagnosis not present

## 2021-12-30 DIAGNOSIS — N1831 Chronic kidney disease, stage 3a: Secondary | ICD-10-CM | POA: Diagnosis not present

## 2022-01-03 DIAGNOSIS — N1831 Chronic kidney disease, stage 3a: Secondary | ICD-10-CM | POA: Diagnosis not present

## 2022-01-03 DIAGNOSIS — J8410 Pulmonary fibrosis, unspecified: Secondary | ICD-10-CM | POA: Diagnosis not present

## 2022-01-03 DIAGNOSIS — R630 Anorexia: Secondary | ICD-10-CM | POA: Diagnosis not present

## 2022-01-03 DIAGNOSIS — I739 Peripheral vascular disease, unspecified: Secondary | ICD-10-CM | POA: Diagnosis not present

## 2022-01-03 DIAGNOSIS — R634 Abnormal weight loss: Secondary | ICD-10-CM | POA: Diagnosis not present

## 2022-01-03 DIAGNOSIS — I129 Hypertensive chronic kidney disease with stage 1 through stage 4 chronic kidney disease, or unspecified chronic kidney disease: Secondary | ICD-10-CM | POA: Diagnosis not present

## 2022-01-04 DIAGNOSIS — R630 Anorexia: Secondary | ICD-10-CM | POA: Diagnosis not present

## 2022-01-04 DIAGNOSIS — I129 Hypertensive chronic kidney disease with stage 1 through stage 4 chronic kidney disease, or unspecified chronic kidney disease: Secondary | ICD-10-CM | POA: Diagnosis not present

## 2022-01-04 DIAGNOSIS — J8410 Pulmonary fibrosis, unspecified: Secondary | ICD-10-CM | POA: Diagnosis not present

## 2022-01-04 DIAGNOSIS — I739 Peripheral vascular disease, unspecified: Secondary | ICD-10-CM | POA: Diagnosis not present

## 2022-01-04 DIAGNOSIS — N1831 Chronic kidney disease, stage 3a: Secondary | ICD-10-CM | POA: Diagnosis not present

## 2022-01-04 DIAGNOSIS — R634 Abnormal weight loss: Secondary | ICD-10-CM | POA: Diagnosis not present

## 2022-01-06 DIAGNOSIS — N1831 Chronic kidney disease, stage 3a: Secondary | ICD-10-CM | POA: Diagnosis not present

## 2022-01-06 DIAGNOSIS — I129 Hypertensive chronic kidney disease with stage 1 through stage 4 chronic kidney disease, or unspecified chronic kidney disease: Secondary | ICD-10-CM | POA: Diagnosis not present

## 2022-01-06 DIAGNOSIS — I739 Peripheral vascular disease, unspecified: Secondary | ICD-10-CM | POA: Diagnosis not present

## 2022-01-06 DIAGNOSIS — J8410 Pulmonary fibrosis, unspecified: Secondary | ICD-10-CM | POA: Diagnosis not present

## 2022-01-06 DIAGNOSIS — R634 Abnormal weight loss: Secondary | ICD-10-CM | POA: Diagnosis not present

## 2022-01-06 DIAGNOSIS — R630 Anorexia: Secondary | ICD-10-CM | POA: Diagnosis not present

## 2022-01-10 DIAGNOSIS — J8410 Pulmonary fibrosis, unspecified: Secondary | ICD-10-CM | POA: Diagnosis not present

## 2022-01-10 DIAGNOSIS — R634 Abnormal weight loss: Secondary | ICD-10-CM | POA: Diagnosis not present

## 2022-01-10 DIAGNOSIS — R630 Anorexia: Secondary | ICD-10-CM | POA: Diagnosis not present

## 2022-01-10 DIAGNOSIS — N1831 Chronic kidney disease, stage 3a: Secondary | ICD-10-CM | POA: Diagnosis not present

## 2022-01-10 DIAGNOSIS — I739 Peripheral vascular disease, unspecified: Secondary | ICD-10-CM | POA: Diagnosis not present

## 2022-01-10 DIAGNOSIS — I129 Hypertensive chronic kidney disease with stage 1 through stage 4 chronic kidney disease, or unspecified chronic kidney disease: Secondary | ICD-10-CM | POA: Diagnosis not present

## 2022-01-11 DIAGNOSIS — R634 Abnormal weight loss: Secondary | ICD-10-CM | POA: Diagnosis not present

## 2022-01-11 DIAGNOSIS — I129 Hypertensive chronic kidney disease with stage 1 through stage 4 chronic kidney disease, or unspecified chronic kidney disease: Secondary | ICD-10-CM | POA: Diagnosis not present

## 2022-01-11 DIAGNOSIS — R630 Anorexia: Secondary | ICD-10-CM | POA: Diagnosis not present

## 2022-01-11 DIAGNOSIS — N1831 Chronic kidney disease, stage 3a: Secondary | ICD-10-CM | POA: Diagnosis not present

## 2022-01-11 DIAGNOSIS — J8410 Pulmonary fibrosis, unspecified: Secondary | ICD-10-CM | POA: Diagnosis not present

## 2022-01-11 DIAGNOSIS — I739 Peripheral vascular disease, unspecified: Secondary | ICD-10-CM | POA: Diagnosis not present

## 2022-01-13 DIAGNOSIS — N1831 Chronic kidney disease, stage 3a: Secondary | ICD-10-CM | POA: Diagnosis not present

## 2022-01-13 DIAGNOSIS — J8410 Pulmonary fibrosis, unspecified: Secondary | ICD-10-CM | POA: Diagnosis not present

## 2022-01-13 DIAGNOSIS — R634 Abnormal weight loss: Secondary | ICD-10-CM | POA: Diagnosis not present

## 2022-01-13 DIAGNOSIS — R630 Anorexia: Secondary | ICD-10-CM | POA: Diagnosis not present

## 2022-01-13 DIAGNOSIS — I739 Peripheral vascular disease, unspecified: Secondary | ICD-10-CM | POA: Diagnosis not present

## 2022-01-13 DIAGNOSIS — I129 Hypertensive chronic kidney disease with stage 1 through stage 4 chronic kidney disease, or unspecified chronic kidney disease: Secondary | ICD-10-CM | POA: Diagnosis not present

## 2022-01-16 ENCOUNTER — Ambulatory Visit: Payer: Medicare Other | Admitting: Dermatology

## 2022-01-17 DIAGNOSIS — I739 Peripheral vascular disease, unspecified: Secondary | ICD-10-CM | POA: Diagnosis not present

## 2022-01-17 DIAGNOSIS — N1831 Chronic kidney disease, stage 3a: Secondary | ICD-10-CM | POA: Diagnosis not present

## 2022-01-17 DIAGNOSIS — C4490 Unspecified malignant neoplasm of skin, unspecified: Secondary | ICD-10-CM | POA: Diagnosis not present

## 2022-01-17 DIAGNOSIS — H04129 Dry eye syndrome of unspecified lacrimal gland: Secondary | ICD-10-CM | POA: Diagnosis not present

## 2022-01-17 DIAGNOSIS — G250 Essential tremor: Secondary | ICD-10-CM | POA: Diagnosis not present

## 2022-01-17 DIAGNOSIS — E039 Hypothyroidism, unspecified: Secondary | ICD-10-CM | POA: Diagnosis not present

## 2022-01-17 DIAGNOSIS — R630 Anorexia: Secondary | ICD-10-CM | POA: Diagnosis not present

## 2022-01-17 DIAGNOSIS — K449 Diaphragmatic hernia without obstruction or gangrene: Secondary | ICD-10-CM | POA: Diagnosis not present

## 2022-01-17 DIAGNOSIS — R634 Abnormal weight loss: Secondary | ICD-10-CM | POA: Diagnosis not present

## 2022-01-17 DIAGNOSIS — F32A Depression, unspecified: Secondary | ICD-10-CM | POA: Diagnosis not present

## 2022-01-17 DIAGNOSIS — Z87440 Personal history of urinary (tract) infections: Secondary | ICD-10-CM | POA: Diagnosis not present

## 2022-01-17 DIAGNOSIS — J439 Emphysema, unspecified: Secondary | ICD-10-CM | POA: Diagnosis not present

## 2022-01-17 DIAGNOSIS — D631 Anemia in chronic kidney disease: Secondary | ICD-10-CM | POA: Diagnosis not present

## 2022-01-17 DIAGNOSIS — Z681 Body mass index (BMI) 19 or less, adult: Secondary | ICD-10-CM | POA: Diagnosis not present

## 2022-01-17 DIAGNOSIS — H409 Unspecified glaucoma: Secondary | ICD-10-CM | POA: Diagnosis not present

## 2022-01-17 DIAGNOSIS — J8410 Pulmonary fibrosis, unspecified: Secondary | ICD-10-CM | POA: Diagnosis not present

## 2022-01-17 DIAGNOSIS — I129 Hypertensive chronic kidney disease with stage 1 through stage 4 chronic kidney disease, or unspecified chronic kidney disease: Secondary | ICD-10-CM | POA: Diagnosis not present

## 2022-01-18 DIAGNOSIS — J8410 Pulmonary fibrosis, unspecified: Secondary | ICD-10-CM | POA: Diagnosis not present

## 2022-01-18 DIAGNOSIS — R634 Abnormal weight loss: Secondary | ICD-10-CM | POA: Diagnosis not present

## 2022-01-18 DIAGNOSIS — I129 Hypertensive chronic kidney disease with stage 1 through stage 4 chronic kidney disease, or unspecified chronic kidney disease: Secondary | ICD-10-CM | POA: Diagnosis not present

## 2022-01-18 DIAGNOSIS — I739 Peripheral vascular disease, unspecified: Secondary | ICD-10-CM | POA: Diagnosis not present

## 2022-01-18 DIAGNOSIS — R630 Anorexia: Secondary | ICD-10-CM | POA: Diagnosis not present

## 2022-01-18 DIAGNOSIS — N1831 Chronic kidney disease, stage 3a: Secondary | ICD-10-CM | POA: Diagnosis not present

## 2022-01-20 DIAGNOSIS — R634 Abnormal weight loss: Secondary | ICD-10-CM | POA: Diagnosis not present

## 2022-01-20 DIAGNOSIS — N1831 Chronic kidney disease, stage 3a: Secondary | ICD-10-CM | POA: Diagnosis not present

## 2022-01-20 DIAGNOSIS — I739 Peripheral vascular disease, unspecified: Secondary | ICD-10-CM | POA: Diagnosis not present

## 2022-01-20 DIAGNOSIS — R630 Anorexia: Secondary | ICD-10-CM | POA: Diagnosis not present

## 2022-01-20 DIAGNOSIS — J8410 Pulmonary fibrosis, unspecified: Secondary | ICD-10-CM | POA: Diagnosis not present

## 2022-01-20 DIAGNOSIS — I129 Hypertensive chronic kidney disease with stage 1 through stage 4 chronic kidney disease, or unspecified chronic kidney disease: Secondary | ICD-10-CM | POA: Diagnosis not present

## 2022-01-24 DIAGNOSIS — I129 Hypertensive chronic kidney disease with stage 1 through stage 4 chronic kidney disease, or unspecified chronic kidney disease: Secondary | ICD-10-CM | POA: Diagnosis not present

## 2022-01-24 DIAGNOSIS — R630 Anorexia: Secondary | ICD-10-CM | POA: Diagnosis not present

## 2022-01-24 DIAGNOSIS — I739 Peripheral vascular disease, unspecified: Secondary | ICD-10-CM | POA: Diagnosis not present

## 2022-01-24 DIAGNOSIS — N1831 Chronic kidney disease, stage 3a: Secondary | ICD-10-CM | POA: Diagnosis not present

## 2022-01-24 DIAGNOSIS — J8410 Pulmonary fibrosis, unspecified: Secondary | ICD-10-CM | POA: Diagnosis not present

## 2022-01-24 DIAGNOSIS — R634 Abnormal weight loss: Secondary | ICD-10-CM | POA: Diagnosis not present

## 2022-01-25 DIAGNOSIS — R634 Abnormal weight loss: Secondary | ICD-10-CM | POA: Diagnosis not present

## 2022-01-25 DIAGNOSIS — I129 Hypertensive chronic kidney disease with stage 1 through stage 4 chronic kidney disease, or unspecified chronic kidney disease: Secondary | ICD-10-CM | POA: Diagnosis not present

## 2022-01-25 DIAGNOSIS — J8410 Pulmonary fibrosis, unspecified: Secondary | ICD-10-CM | POA: Diagnosis not present

## 2022-01-25 DIAGNOSIS — I739 Peripheral vascular disease, unspecified: Secondary | ICD-10-CM | POA: Diagnosis not present

## 2022-01-25 DIAGNOSIS — R630 Anorexia: Secondary | ICD-10-CM | POA: Diagnosis not present

## 2022-01-25 DIAGNOSIS — N1831 Chronic kidney disease, stage 3a: Secondary | ICD-10-CM | POA: Diagnosis not present

## 2022-01-27 DIAGNOSIS — I739 Peripheral vascular disease, unspecified: Secondary | ICD-10-CM | POA: Diagnosis not present

## 2022-01-27 DIAGNOSIS — R630 Anorexia: Secondary | ICD-10-CM | POA: Diagnosis not present

## 2022-01-27 DIAGNOSIS — I129 Hypertensive chronic kidney disease with stage 1 through stage 4 chronic kidney disease, or unspecified chronic kidney disease: Secondary | ICD-10-CM | POA: Diagnosis not present

## 2022-01-27 DIAGNOSIS — J8410 Pulmonary fibrosis, unspecified: Secondary | ICD-10-CM | POA: Diagnosis not present

## 2022-01-27 DIAGNOSIS — R634 Abnormal weight loss: Secondary | ICD-10-CM | POA: Diagnosis not present

## 2022-01-27 DIAGNOSIS — N1831 Chronic kidney disease, stage 3a: Secondary | ICD-10-CM | POA: Diagnosis not present

## 2022-01-31 DIAGNOSIS — I739 Peripheral vascular disease, unspecified: Secondary | ICD-10-CM | POA: Diagnosis not present

## 2022-01-31 DIAGNOSIS — R630 Anorexia: Secondary | ICD-10-CM | POA: Diagnosis not present

## 2022-01-31 DIAGNOSIS — I129 Hypertensive chronic kidney disease with stage 1 through stage 4 chronic kidney disease, or unspecified chronic kidney disease: Secondary | ICD-10-CM | POA: Diagnosis not present

## 2022-01-31 DIAGNOSIS — R634 Abnormal weight loss: Secondary | ICD-10-CM | POA: Diagnosis not present

## 2022-01-31 DIAGNOSIS — J8410 Pulmonary fibrosis, unspecified: Secondary | ICD-10-CM | POA: Diagnosis not present

## 2022-01-31 DIAGNOSIS — N1831 Chronic kidney disease, stage 3a: Secondary | ICD-10-CM | POA: Diagnosis not present

## 2022-02-01 DIAGNOSIS — R634 Abnormal weight loss: Secondary | ICD-10-CM | POA: Diagnosis not present

## 2022-02-01 DIAGNOSIS — I739 Peripheral vascular disease, unspecified: Secondary | ICD-10-CM | POA: Diagnosis not present

## 2022-02-01 DIAGNOSIS — R630 Anorexia: Secondary | ICD-10-CM | POA: Diagnosis not present

## 2022-02-01 DIAGNOSIS — I129 Hypertensive chronic kidney disease with stage 1 through stage 4 chronic kidney disease, or unspecified chronic kidney disease: Secondary | ICD-10-CM | POA: Diagnosis not present

## 2022-02-01 DIAGNOSIS — N1831 Chronic kidney disease, stage 3a: Secondary | ICD-10-CM | POA: Diagnosis not present

## 2022-02-01 DIAGNOSIS — J8410 Pulmonary fibrosis, unspecified: Secondary | ICD-10-CM | POA: Diagnosis not present

## 2022-02-03 DIAGNOSIS — R634 Abnormal weight loss: Secondary | ICD-10-CM | POA: Diagnosis not present

## 2022-02-03 DIAGNOSIS — I129 Hypertensive chronic kidney disease with stage 1 through stage 4 chronic kidney disease, or unspecified chronic kidney disease: Secondary | ICD-10-CM | POA: Diagnosis not present

## 2022-02-03 DIAGNOSIS — J8410 Pulmonary fibrosis, unspecified: Secondary | ICD-10-CM | POA: Diagnosis not present

## 2022-02-03 DIAGNOSIS — I739 Peripheral vascular disease, unspecified: Secondary | ICD-10-CM | POA: Diagnosis not present

## 2022-02-03 DIAGNOSIS — N1831 Chronic kidney disease, stage 3a: Secondary | ICD-10-CM | POA: Diagnosis not present

## 2022-02-03 DIAGNOSIS — R630 Anorexia: Secondary | ICD-10-CM | POA: Diagnosis not present

## 2022-02-07 DIAGNOSIS — I129 Hypertensive chronic kidney disease with stage 1 through stage 4 chronic kidney disease, or unspecified chronic kidney disease: Secondary | ICD-10-CM | POA: Diagnosis not present

## 2022-02-07 DIAGNOSIS — R630 Anorexia: Secondary | ICD-10-CM | POA: Diagnosis not present

## 2022-02-07 DIAGNOSIS — N1831 Chronic kidney disease, stage 3a: Secondary | ICD-10-CM | POA: Diagnosis not present

## 2022-02-07 DIAGNOSIS — J8410 Pulmonary fibrosis, unspecified: Secondary | ICD-10-CM | POA: Diagnosis not present

## 2022-02-07 DIAGNOSIS — R634 Abnormal weight loss: Secondary | ICD-10-CM | POA: Diagnosis not present

## 2022-02-07 DIAGNOSIS — I739 Peripheral vascular disease, unspecified: Secondary | ICD-10-CM | POA: Diagnosis not present

## 2022-02-08 DIAGNOSIS — J8410 Pulmonary fibrosis, unspecified: Secondary | ICD-10-CM | POA: Diagnosis not present

## 2022-02-08 DIAGNOSIS — R634 Abnormal weight loss: Secondary | ICD-10-CM | POA: Diagnosis not present

## 2022-02-08 DIAGNOSIS — R630 Anorexia: Secondary | ICD-10-CM | POA: Diagnosis not present

## 2022-02-08 DIAGNOSIS — I739 Peripheral vascular disease, unspecified: Secondary | ICD-10-CM | POA: Diagnosis not present

## 2022-02-08 DIAGNOSIS — I129 Hypertensive chronic kidney disease with stage 1 through stage 4 chronic kidney disease, or unspecified chronic kidney disease: Secondary | ICD-10-CM | POA: Diagnosis not present

## 2022-02-08 DIAGNOSIS — N1831 Chronic kidney disease, stage 3a: Secondary | ICD-10-CM | POA: Diagnosis not present

## 2022-02-10 DIAGNOSIS — J8410 Pulmonary fibrosis, unspecified: Secondary | ICD-10-CM | POA: Diagnosis not present

## 2022-02-10 DIAGNOSIS — I129 Hypertensive chronic kidney disease with stage 1 through stage 4 chronic kidney disease, or unspecified chronic kidney disease: Secondary | ICD-10-CM | POA: Diagnosis not present

## 2022-02-10 DIAGNOSIS — N1831 Chronic kidney disease, stage 3a: Secondary | ICD-10-CM | POA: Diagnosis not present

## 2022-02-10 DIAGNOSIS — R630 Anorexia: Secondary | ICD-10-CM | POA: Diagnosis not present

## 2022-02-10 DIAGNOSIS — R634 Abnormal weight loss: Secondary | ICD-10-CM | POA: Diagnosis not present

## 2022-02-10 DIAGNOSIS — I739 Peripheral vascular disease, unspecified: Secondary | ICD-10-CM | POA: Diagnosis not present

## 2022-02-14 DIAGNOSIS — N1831 Chronic kidney disease, stage 3a: Secondary | ICD-10-CM | POA: Diagnosis not present

## 2022-02-14 DIAGNOSIS — R634 Abnormal weight loss: Secondary | ICD-10-CM | POA: Diagnosis not present

## 2022-02-14 DIAGNOSIS — R630 Anorexia: Secondary | ICD-10-CM | POA: Diagnosis not present

## 2022-02-14 DIAGNOSIS — I129 Hypertensive chronic kidney disease with stage 1 through stage 4 chronic kidney disease, or unspecified chronic kidney disease: Secondary | ICD-10-CM | POA: Diagnosis not present

## 2022-02-14 DIAGNOSIS — J8410 Pulmonary fibrosis, unspecified: Secondary | ICD-10-CM | POA: Diagnosis not present

## 2022-02-14 DIAGNOSIS — I739 Peripheral vascular disease, unspecified: Secondary | ICD-10-CM | POA: Diagnosis not present

## 2022-02-15 DIAGNOSIS — I739 Peripheral vascular disease, unspecified: Secondary | ICD-10-CM | POA: Diagnosis not present

## 2022-02-15 DIAGNOSIS — R630 Anorexia: Secondary | ICD-10-CM | POA: Diagnosis not present

## 2022-02-15 DIAGNOSIS — I129 Hypertensive chronic kidney disease with stage 1 through stage 4 chronic kidney disease, or unspecified chronic kidney disease: Secondary | ICD-10-CM | POA: Diagnosis not present

## 2022-02-15 DIAGNOSIS — R634 Abnormal weight loss: Secondary | ICD-10-CM | POA: Diagnosis not present

## 2022-02-15 DIAGNOSIS — N1831 Chronic kidney disease, stage 3a: Secondary | ICD-10-CM | POA: Diagnosis not present

## 2022-02-15 DIAGNOSIS — J8410 Pulmonary fibrosis, unspecified: Secondary | ICD-10-CM | POA: Diagnosis not present

## 2022-02-17 DIAGNOSIS — I129 Hypertensive chronic kidney disease with stage 1 through stage 4 chronic kidney disease, or unspecified chronic kidney disease: Secondary | ICD-10-CM | POA: Diagnosis not present

## 2022-02-17 DIAGNOSIS — H04129 Dry eye syndrome of unspecified lacrimal gland: Secondary | ICD-10-CM | POA: Diagnosis not present

## 2022-02-17 DIAGNOSIS — K449 Diaphragmatic hernia without obstruction or gangrene: Secondary | ICD-10-CM | POA: Diagnosis not present

## 2022-02-17 DIAGNOSIS — J439 Emphysema, unspecified: Secondary | ICD-10-CM | POA: Diagnosis not present

## 2022-02-17 DIAGNOSIS — Z681 Body mass index (BMI) 19 or less, adult: Secondary | ICD-10-CM | POA: Diagnosis not present

## 2022-02-17 DIAGNOSIS — F32A Depression, unspecified: Secondary | ICD-10-CM | POA: Diagnosis not present

## 2022-02-17 DIAGNOSIS — E039 Hypothyroidism, unspecified: Secondary | ICD-10-CM | POA: Diagnosis not present

## 2022-02-17 DIAGNOSIS — R630 Anorexia: Secondary | ICD-10-CM | POA: Diagnosis not present

## 2022-02-17 DIAGNOSIS — I739 Peripheral vascular disease, unspecified: Secondary | ICD-10-CM | POA: Diagnosis not present

## 2022-02-17 DIAGNOSIS — G250 Essential tremor: Secondary | ICD-10-CM | POA: Diagnosis not present

## 2022-02-17 DIAGNOSIS — Z87440 Personal history of urinary (tract) infections: Secondary | ICD-10-CM | POA: Diagnosis not present

## 2022-02-17 DIAGNOSIS — J8410 Pulmonary fibrosis, unspecified: Secondary | ICD-10-CM | POA: Diagnosis not present

## 2022-02-17 DIAGNOSIS — C4490 Unspecified malignant neoplasm of skin, unspecified: Secondary | ICD-10-CM | POA: Diagnosis not present

## 2022-02-17 DIAGNOSIS — N1831 Chronic kidney disease, stage 3a: Secondary | ICD-10-CM | POA: Diagnosis not present

## 2022-02-17 DIAGNOSIS — H409 Unspecified glaucoma: Secondary | ICD-10-CM | POA: Diagnosis not present

## 2022-02-17 DIAGNOSIS — D631 Anemia in chronic kidney disease: Secondary | ICD-10-CM | POA: Diagnosis not present

## 2022-02-17 DIAGNOSIS — R634 Abnormal weight loss: Secondary | ICD-10-CM | POA: Diagnosis not present

## 2022-02-21 DIAGNOSIS — I129 Hypertensive chronic kidney disease with stage 1 through stage 4 chronic kidney disease, or unspecified chronic kidney disease: Secondary | ICD-10-CM | POA: Diagnosis not present

## 2022-02-21 DIAGNOSIS — J8410 Pulmonary fibrosis, unspecified: Secondary | ICD-10-CM | POA: Diagnosis not present

## 2022-02-21 DIAGNOSIS — N1831 Chronic kidney disease, stage 3a: Secondary | ICD-10-CM | POA: Diagnosis not present

## 2022-02-21 DIAGNOSIS — R630 Anorexia: Secondary | ICD-10-CM | POA: Diagnosis not present

## 2022-02-21 DIAGNOSIS — R634 Abnormal weight loss: Secondary | ICD-10-CM | POA: Diagnosis not present

## 2022-02-21 DIAGNOSIS — I739 Peripheral vascular disease, unspecified: Secondary | ICD-10-CM | POA: Diagnosis not present

## 2022-02-22 DIAGNOSIS — N1831 Chronic kidney disease, stage 3a: Secondary | ICD-10-CM | POA: Diagnosis not present

## 2022-02-22 DIAGNOSIS — I739 Peripheral vascular disease, unspecified: Secondary | ICD-10-CM | POA: Diagnosis not present

## 2022-02-22 DIAGNOSIS — J8410 Pulmonary fibrosis, unspecified: Secondary | ICD-10-CM | POA: Diagnosis not present

## 2022-02-22 DIAGNOSIS — R634 Abnormal weight loss: Secondary | ICD-10-CM | POA: Diagnosis not present

## 2022-02-22 DIAGNOSIS — R630 Anorexia: Secondary | ICD-10-CM | POA: Diagnosis not present

## 2022-02-22 DIAGNOSIS — I129 Hypertensive chronic kidney disease with stage 1 through stage 4 chronic kidney disease, or unspecified chronic kidney disease: Secondary | ICD-10-CM | POA: Diagnosis not present

## 2022-02-23 DIAGNOSIS — R630 Anorexia: Secondary | ICD-10-CM | POA: Diagnosis not present

## 2022-02-23 DIAGNOSIS — I739 Peripheral vascular disease, unspecified: Secondary | ICD-10-CM | POA: Diagnosis not present

## 2022-02-23 DIAGNOSIS — R634 Abnormal weight loss: Secondary | ICD-10-CM | POA: Diagnosis not present

## 2022-02-23 DIAGNOSIS — I129 Hypertensive chronic kidney disease with stage 1 through stage 4 chronic kidney disease, or unspecified chronic kidney disease: Secondary | ICD-10-CM | POA: Diagnosis not present

## 2022-02-23 DIAGNOSIS — J8410 Pulmonary fibrosis, unspecified: Secondary | ICD-10-CM | POA: Diagnosis not present

## 2022-02-23 DIAGNOSIS — N1831 Chronic kidney disease, stage 3a: Secondary | ICD-10-CM | POA: Diagnosis not present

## 2022-02-24 DIAGNOSIS — R630 Anorexia: Secondary | ICD-10-CM | POA: Diagnosis not present

## 2022-02-24 DIAGNOSIS — R634 Abnormal weight loss: Secondary | ICD-10-CM | POA: Diagnosis not present

## 2022-02-24 DIAGNOSIS — J8410 Pulmonary fibrosis, unspecified: Secondary | ICD-10-CM | POA: Diagnosis not present

## 2022-02-24 DIAGNOSIS — N1831 Chronic kidney disease, stage 3a: Secondary | ICD-10-CM | POA: Diagnosis not present

## 2022-02-24 DIAGNOSIS — I739 Peripheral vascular disease, unspecified: Secondary | ICD-10-CM | POA: Diagnosis not present

## 2022-02-24 DIAGNOSIS — I129 Hypertensive chronic kidney disease with stage 1 through stage 4 chronic kidney disease, or unspecified chronic kidney disease: Secondary | ICD-10-CM | POA: Diagnosis not present

## 2022-02-28 DIAGNOSIS — R630 Anorexia: Secondary | ICD-10-CM | POA: Diagnosis not present

## 2022-02-28 DIAGNOSIS — J8410 Pulmonary fibrosis, unspecified: Secondary | ICD-10-CM | POA: Diagnosis not present

## 2022-02-28 DIAGNOSIS — I739 Peripheral vascular disease, unspecified: Secondary | ICD-10-CM | POA: Diagnosis not present

## 2022-02-28 DIAGNOSIS — I129 Hypertensive chronic kidney disease with stage 1 through stage 4 chronic kidney disease, or unspecified chronic kidney disease: Secondary | ICD-10-CM | POA: Diagnosis not present

## 2022-02-28 DIAGNOSIS — N1831 Chronic kidney disease, stage 3a: Secondary | ICD-10-CM | POA: Diagnosis not present

## 2022-02-28 DIAGNOSIS — R634 Abnormal weight loss: Secondary | ICD-10-CM | POA: Diagnosis not present

## 2022-03-01 DIAGNOSIS — N1831 Chronic kidney disease, stage 3a: Secondary | ICD-10-CM | POA: Diagnosis not present

## 2022-03-01 DIAGNOSIS — I129 Hypertensive chronic kidney disease with stage 1 through stage 4 chronic kidney disease, or unspecified chronic kidney disease: Secondary | ICD-10-CM | POA: Diagnosis not present

## 2022-03-01 DIAGNOSIS — R630 Anorexia: Secondary | ICD-10-CM | POA: Diagnosis not present

## 2022-03-01 DIAGNOSIS — I739 Peripheral vascular disease, unspecified: Secondary | ICD-10-CM | POA: Diagnosis not present

## 2022-03-01 DIAGNOSIS — R634 Abnormal weight loss: Secondary | ICD-10-CM | POA: Diagnosis not present

## 2022-03-01 DIAGNOSIS — J8410 Pulmonary fibrosis, unspecified: Secondary | ICD-10-CM | POA: Diagnosis not present

## 2022-03-03 DIAGNOSIS — I739 Peripheral vascular disease, unspecified: Secondary | ICD-10-CM | POA: Diagnosis not present

## 2022-03-03 DIAGNOSIS — R630 Anorexia: Secondary | ICD-10-CM | POA: Diagnosis not present

## 2022-03-03 DIAGNOSIS — J8410 Pulmonary fibrosis, unspecified: Secondary | ICD-10-CM | POA: Diagnosis not present

## 2022-03-03 DIAGNOSIS — I129 Hypertensive chronic kidney disease with stage 1 through stage 4 chronic kidney disease, or unspecified chronic kidney disease: Secondary | ICD-10-CM | POA: Diagnosis not present

## 2022-03-03 DIAGNOSIS — R634 Abnormal weight loss: Secondary | ICD-10-CM | POA: Diagnosis not present

## 2022-03-03 DIAGNOSIS — N1831 Chronic kidney disease, stage 3a: Secondary | ICD-10-CM | POA: Diagnosis not present

## 2022-03-07 DIAGNOSIS — I739 Peripheral vascular disease, unspecified: Secondary | ICD-10-CM | POA: Diagnosis not present

## 2022-03-07 DIAGNOSIS — J8410 Pulmonary fibrosis, unspecified: Secondary | ICD-10-CM | POA: Diagnosis not present

## 2022-03-07 DIAGNOSIS — N1831 Chronic kidney disease, stage 3a: Secondary | ICD-10-CM | POA: Diagnosis not present

## 2022-03-07 DIAGNOSIS — R634 Abnormal weight loss: Secondary | ICD-10-CM | POA: Diagnosis not present

## 2022-03-07 DIAGNOSIS — I129 Hypertensive chronic kidney disease with stage 1 through stage 4 chronic kidney disease, or unspecified chronic kidney disease: Secondary | ICD-10-CM | POA: Diagnosis not present

## 2022-03-07 DIAGNOSIS — R630 Anorexia: Secondary | ICD-10-CM | POA: Diagnosis not present

## 2022-03-08 DIAGNOSIS — I739 Peripheral vascular disease, unspecified: Secondary | ICD-10-CM | POA: Diagnosis not present

## 2022-03-08 DIAGNOSIS — I129 Hypertensive chronic kidney disease with stage 1 through stage 4 chronic kidney disease, or unspecified chronic kidney disease: Secondary | ICD-10-CM | POA: Diagnosis not present

## 2022-03-08 DIAGNOSIS — J8410 Pulmonary fibrosis, unspecified: Secondary | ICD-10-CM | POA: Diagnosis not present

## 2022-03-08 DIAGNOSIS — R630 Anorexia: Secondary | ICD-10-CM | POA: Diagnosis not present

## 2022-03-08 DIAGNOSIS — R634 Abnormal weight loss: Secondary | ICD-10-CM | POA: Diagnosis not present

## 2022-03-08 DIAGNOSIS — N1831 Chronic kidney disease, stage 3a: Secondary | ICD-10-CM | POA: Diagnosis not present

## 2022-03-10 DIAGNOSIS — J8410 Pulmonary fibrosis, unspecified: Secondary | ICD-10-CM | POA: Diagnosis not present

## 2022-03-10 DIAGNOSIS — I739 Peripheral vascular disease, unspecified: Secondary | ICD-10-CM | POA: Diagnosis not present

## 2022-03-10 DIAGNOSIS — R634 Abnormal weight loss: Secondary | ICD-10-CM | POA: Diagnosis not present

## 2022-03-10 DIAGNOSIS — I129 Hypertensive chronic kidney disease with stage 1 through stage 4 chronic kidney disease, or unspecified chronic kidney disease: Secondary | ICD-10-CM | POA: Diagnosis not present

## 2022-03-10 DIAGNOSIS — R630 Anorexia: Secondary | ICD-10-CM | POA: Diagnosis not present

## 2022-03-10 DIAGNOSIS — N1831 Chronic kidney disease, stage 3a: Secondary | ICD-10-CM | POA: Diagnosis not present

## 2022-03-14 DIAGNOSIS — I739 Peripheral vascular disease, unspecified: Secondary | ICD-10-CM | POA: Diagnosis not present

## 2022-03-14 DIAGNOSIS — N1831 Chronic kidney disease, stage 3a: Secondary | ICD-10-CM | POA: Diagnosis not present

## 2022-03-14 DIAGNOSIS — R634 Abnormal weight loss: Secondary | ICD-10-CM | POA: Diagnosis not present

## 2022-03-14 DIAGNOSIS — J8410 Pulmonary fibrosis, unspecified: Secondary | ICD-10-CM | POA: Diagnosis not present

## 2022-03-14 DIAGNOSIS — I129 Hypertensive chronic kidney disease with stage 1 through stage 4 chronic kidney disease, or unspecified chronic kidney disease: Secondary | ICD-10-CM | POA: Diagnosis not present

## 2022-03-14 DIAGNOSIS — R630 Anorexia: Secondary | ICD-10-CM | POA: Diagnosis not present

## 2022-03-15 ENCOUNTER — Other Ambulatory Visit: Payer: Self-pay | Admitting: Primary Care

## 2022-03-15 DIAGNOSIS — E039 Hypothyroidism, unspecified: Secondary | ICD-10-CM

## 2022-03-15 DIAGNOSIS — I129 Hypertensive chronic kidney disease with stage 1 through stage 4 chronic kidney disease, or unspecified chronic kidney disease: Secondary | ICD-10-CM | POA: Diagnosis not present

## 2022-03-15 DIAGNOSIS — I739 Peripheral vascular disease, unspecified: Secondary | ICD-10-CM | POA: Diagnosis not present

## 2022-03-15 DIAGNOSIS — R634 Abnormal weight loss: Secondary | ICD-10-CM | POA: Diagnosis not present

## 2022-03-15 DIAGNOSIS — J8410 Pulmonary fibrosis, unspecified: Secondary | ICD-10-CM | POA: Diagnosis not present

## 2022-03-15 DIAGNOSIS — R630 Anorexia: Secondary | ICD-10-CM | POA: Diagnosis not present

## 2022-03-15 DIAGNOSIS — N1831 Chronic kidney disease, stage 3a: Secondary | ICD-10-CM | POA: Diagnosis not present

## 2022-03-15 DIAGNOSIS — N390 Urinary tract infection, site not specified: Secondary | ICD-10-CM

## 2022-03-17 DIAGNOSIS — I129 Hypertensive chronic kidney disease with stage 1 through stage 4 chronic kidney disease, or unspecified chronic kidney disease: Secondary | ICD-10-CM | POA: Diagnosis not present

## 2022-03-17 DIAGNOSIS — R634 Abnormal weight loss: Secondary | ICD-10-CM | POA: Diagnosis not present

## 2022-03-17 DIAGNOSIS — I739 Peripheral vascular disease, unspecified: Secondary | ICD-10-CM | POA: Diagnosis not present

## 2022-03-17 DIAGNOSIS — R630 Anorexia: Secondary | ICD-10-CM | POA: Diagnosis not present

## 2022-03-17 DIAGNOSIS — J8410 Pulmonary fibrosis, unspecified: Secondary | ICD-10-CM | POA: Diagnosis not present

## 2022-03-17 DIAGNOSIS — N1831 Chronic kidney disease, stage 3a: Secondary | ICD-10-CM | POA: Diagnosis not present

## 2022-03-19 DIAGNOSIS — N1831 Chronic kidney disease, stage 3a: Secondary | ICD-10-CM | POA: Diagnosis not present

## 2022-03-19 DIAGNOSIS — K449 Diaphragmatic hernia without obstruction or gangrene: Secondary | ICD-10-CM | POA: Diagnosis not present

## 2022-03-19 DIAGNOSIS — D631 Anemia in chronic kidney disease: Secondary | ICD-10-CM | POA: Diagnosis not present

## 2022-03-19 DIAGNOSIS — R630 Anorexia: Secondary | ICD-10-CM | POA: Diagnosis not present

## 2022-03-19 DIAGNOSIS — E43 Unspecified severe protein-calorie malnutrition: Secondary | ICD-10-CM | POA: Diagnosis not present

## 2022-03-19 DIAGNOSIS — J8410 Pulmonary fibrosis, unspecified: Secondary | ICD-10-CM | POA: Diagnosis not present

## 2022-03-19 DIAGNOSIS — R634 Abnormal weight loss: Secondary | ICD-10-CM | POA: Diagnosis not present

## 2022-03-19 DIAGNOSIS — F32A Depression, unspecified: Secondary | ICD-10-CM | POA: Diagnosis not present

## 2022-03-19 DIAGNOSIS — I739 Peripheral vascular disease, unspecified: Secondary | ICD-10-CM | POA: Diagnosis not present

## 2022-03-19 DIAGNOSIS — C4490 Unspecified malignant neoplasm of skin, unspecified: Secondary | ICD-10-CM | POA: Diagnosis not present

## 2022-03-19 DIAGNOSIS — H409 Unspecified glaucoma: Secondary | ICD-10-CM | POA: Diagnosis not present

## 2022-03-19 DIAGNOSIS — L89151 Pressure ulcer of sacral region, stage 1: Secondary | ICD-10-CM | POA: Diagnosis not present

## 2022-03-19 DIAGNOSIS — H04129 Dry eye syndrome of unspecified lacrimal gland: Secondary | ICD-10-CM | POA: Diagnosis not present

## 2022-03-19 DIAGNOSIS — J439 Emphysema, unspecified: Secondary | ICD-10-CM | POA: Diagnosis not present

## 2022-03-19 DIAGNOSIS — H9190 Unspecified hearing loss, unspecified ear: Secondary | ICD-10-CM | POA: Diagnosis not present

## 2022-03-19 DIAGNOSIS — E8809 Other disorders of plasma-protein metabolism, not elsewhere classified: Secondary | ICD-10-CM | POA: Diagnosis not present

## 2022-03-19 DIAGNOSIS — G250 Essential tremor: Secondary | ICD-10-CM | POA: Diagnosis not present

## 2022-03-19 DIAGNOSIS — E039 Hypothyroidism, unspecified: Secondary | ICD-10-CM | POA: Diagnosis not present

## 2022-03-19 DIAGNOSIS — I129 Hypertensive chronic kidney disease with stage 1 through stage 4 chronic kidney disease, or unspecified chronic kidney disease: Secondary | ICD-10-CM | POA: Diagnosis not present

## 2022-03-19 DIAGNOSIS — Z681 Body mass index (BMI) 19 or less, adult: Secondary | ICD-10-CM | POA: Diagnosis not present

## 2022-03-19 DIAGNOSIS — R627 Adult failure to thrive: Secondary | ICD-10-CM | POA: Diagnosis not present

## 2022-03-19 DIAGNOSIS — Z87440 Personal history of urinary (tract) infections: Secondary | ICD-10-CM | POA: Diagnosis not present

## 2022-03-21 DIAGNOSIS — I129 Hypertensive chronic kidney disease with stage 1 through stage 4 chronic kidney disease, or unspecified chronic kidney disease: Secondary | ICD-10-CM | POA: Diagnosis not present

## 2022-03-21 DIAGNOSIS — J8410 Pulmonary fibrosis, unspecified: Secondary | ICD-10-CM | POA: Diagnosis not present

## 2022-03-21 DIAGNOSIS — R634 Abnormal weight loss: Secondary | ICD-10-CM | POA: Diagnosis not present

## 2022-03-21 DIAGNOSIS — I739 Peripheral vascular disease, unspecified: Secondary | ICD-10-CM | POA: Diagnosis not present

## 2022-03-21 DIAGNOSIS — N1831 Chronic kidney disease, stage 3a: Secondary | ICD-10-CM | POA: Diagnosis not present

## 2022-03-21 DIAGNOSIS — R630 Anorexia: Secondary | ICD-10-CM | POA: Diagnosis not present

## 2022-03-22 DIAGNOSIS — R634 Abnormal weight loss: Secondary | ICD-10-CM | POA: Diagnosis not present

## 2022-03-22 DIAGNOSIS — I129 Hypertensive chronic kidney disease with stage 1 through stage 4 chronic kidney disease, or unspecified chronic kidney disease: Secondary | ICD-10-CM | POA: Diagnosis not present

## 2022-03-22 DIAGNOSIS — I739 Peripheral vascular disease, unspecified: Secondary | ICD-10-CM | POA: Diagnosis not present

## 2022-03-22 DIAGNOSIS — R630 Anorexia: Secondary | ICD-10-CM | POA: Diagnosis not present

## 2022-03-22 DIAGNOSIS — J8410 Pulmonary fibrosis, unspecified: Secondary | ICD-10-CM | POA: Diagnosis not present

## 2022-03-22 DIAGNOSIS — N1831 Chronic kidney disease, stage 3a: Secondary | ICD-10-CM | POA: Diagnosis not present

## 2022-03-24 DIAGNOSIS — I129 Hypertensive chronic kidney disease with stage 1 through stage 4 chronic kidney disease, or unspecified chronic kidney disease: Secondary | ICD-10-CM | POA: Diagnosis not present

## 2022-03-24 DIAGNOSIS — N1831 Chronic kidney disease, stage 3a: Secondary | ICD-10-CM | POA: Diagnosis not present

## 2022-03-24 DIAGNOSIS — R630 Anorexia: Secondary | ICD-10-CM | POA: Diagnosis not present

## 2022-03-24 DIAGNOSIS — I739 Peripheral vascular disease, unspecified: Secondary | ICD-10-CM | POA: Diagnosis not present

## 2022-03-24 DIAGNOSIS — R634 Abnormal weight loss: Secondary | ICD-10-CM | POA: Diagnosis not present

## 2022-03-24 DIAGNOSIS — J8410 Pulmonary fibrosis, unspecified: Secondary | ICD-10-CM | POA: Diagnosis not present

## 2022-03-27 DIAGNOSIS — R634 Abnormal weight loss: Secondary | ICD-10-CM | POA: Diagnosis not present

## 2022-03-27 DIAGNOSIS — I739 Peripheral vascular disease, unspecified: Secondary | ICD-10-CM | POA: Diagnosis not present

## 2022-03-27 DIAGNOSIS — I129 Hypertensive chronic kidney disease with stage 1 through stage 4 chronic kidney disease, or unspecified chronic kidney disease: Secondary | ICD-10-CM | POA: Diagnosis not present

## 2022-03-27 DIAGNOSIS — J8410 Pulmonary fibrosis, unspecified: Secondary | ICD-10-CM | POA: Diagnosis not present

## 2022-03-27 DIAGNOSIS — R630 Anorexia: Secondary | ICD-10-CM | POA: Diagnosis not present

## 2022-03-27 DIAGNOSIS — N1831 Chronic kidney disease, stage 3a: Secondary | ICD-10-CM | POA: Diagnosis not present

## 2022-03-28 DIAGNOSIS — I739 Peripheral vascular disease, unspecified: Secondary | ICD-10-CM | POA: Diagnosis not present

## 2022-03-28 DIAGNOSIS — I129 Hypertensive chronic kidney disease with stage 1 through stage 4 chronic kidney disease, or unspecified chronic kidney disease: Secondary | ICD-10-CM | POA: Diagnosis not present

## 2022-03-28 DIAGNOSIS — N1831 Chronic kidney disease, stage 3a: Secondary | ICD-10-CM | POA: Diagnosis not present

## 2022-03-28 DIAGNOSIS — R630 Anorexia: Secondary | ICD-10-CM | POA: Diagnosis not present

## 2022-03-28 DIAGNOSIS — J8410 Pulmonary fibrosis, unspecified: Secondary | ICD-10-CM | POA: Diagnosis not present

## 2022-03-28 DIAGNOSIS — R634 Abnormal weight loss: Secondary | ICD-10-CM | POA: Diagnosis not present

## 2022-03-29 DIAGNOSIS — R630 Anorexia: Secondary | ICD-10-CM | POA: Diagnosis not present

## 2022-03-29 DIAGNOSIS — I739 Peripheral vascular disease, unspecified: Secondary | ICD-10-CM | POA: Diagnosis not present

## 2022-03-29 DIAGNOSIS — I129 Hypertensive chronic kidney disease with stage 1 through stage 4 chronic kidney disease, or unspecified chronic kidney disease: Secondary | ICD-10-CM | POA: Diagnosis not present

## 2022-03-29 DIAGNOSIS — R634 Abnormal weight loss: Secondary | ICD-10-CM | POA: Diagnosis not present

## 2022-03-29 DIAGNOSIS — J8410 Pulmonary fibrosis, unspecified: Secondary | ICD-10-CM | POA: Diagnosis not present

## 2022-03-29 DIAGNOSIS — N1831 Chronic kidney disease, stage 3a: Secondary | ICD-10-CM | POA: Diagnosis not present

## 2022-03-30 DIAGNOSIS — R630 Anorexia: Secondary | ICD-10-CM | POA: Diagnosis not present

## 2022-03-30 DIAGNOSIS — I739 Peripheral vascular disease, unspecified: Secondary | ICD-10-CM | POA: Diagnosis not present

## 2022-03-30 DIAGNOSIS — J8410 Pulmonary fibrosis, unspecified: Secondary | ICD-10-CM | POA: Diagnosis not present

## 2022-03-30 DIAGNOSIS — R634 Abnormal weight loss: Secondary | ICD-10-CM | POA: Diagnosis not present

## 2022-03-30 DIAGNOSIS — I129 Hypertensive chronic kidney disease with stage 1 through stage 4 chronic kidney disease, or unspecified chronic kidney disease: Secondary | ICD-10-CM | POA: Diagnosis not present

## 2022-03-30 DIAGNOSIS — N1831 Chronic kidney disease, stage 3a: Secondary | ICD-10-CM | POA: Diagnosis not present

## 2022-03-31 DIAGNOSIS — J8410 Pulmonary fibrosis, unspecified: Secondary | ICD-10-CM | POA: Diagnosis not present

## 2022-03-31 DIAGNOSIS — N1831 Chronic kidney disease, stage 3a: Secondary | ICD-10-CM | POA: Diagnosis not present

## 2022-03-31 DIAGNOSIS — I129 Hypertensive chronic kidney disease with stage 1 through stage 4 chronic kidney disease, or unspecified chronic kidney disease: Secondary | ICD-10-CM | POA: Diagnosis not present

## 2022-03-31 DIAGNOSIS — I739 Peripheral vascular disease, unspecified: Secondary | ICD-10-CM | POA: Diagnosis not present

## 2022-03-31 DIAGNOSIS — R634 Abnormal weight loss: Secondary | ICD-10-CM | POA: Diagnosis not present

## 2022-03-31 DIAGNOSIS — R630 Anorexia: Secondary | ICD-10-CM | POA: Diagnosis not present

## 2022-04-01 DIAGNOSIS — I129 Hypertensive chronic kidney disease with stage 1 through stage 4 chronic kidney disease, or unspecified chronic kidney disease: Secondary | ICD-10-CM | POA: Diagnosis not present

## 2022-04-01 DIAGNOSIS — R630 Anorexia: Secondary | ICD-10-CM | POA: Diagnosis not present

## 2022-04-01 DIAGNOSIS — I739 Peripheral vascular disease, unspecified: Secondary | ICD-10-CM | POA: Diagnosis not present

## 2022-04-01 DIAGNOSIS — R634 Abnormal weight loss: Secondary | ICD-10-CM | POA: Diagnosis not present

## 2022-04-01 DIAGNOSIS — J8410 Pulmonary fibrosis, unspecified: Secondary | ICD-10-CM | POA: Diagnosis not present

## 2022-04-01 DIAGNOSIS — N1831 Chronic kidney disease, stage 3a: Secondary | ICD-10-CM | POA: Diagnosis not present

## 2022-04-04 DIAGNOSIS — N1831 Chronic kidney disease, stage 3a: Secondary | ICD-10-CM | POA: Diagnosis not present

## 2022-04-04 DIAGNOSIS — R630 Anorexia: Secondary | ICD-10-CM | POA: Diagnosis not present

## 2022-04-04 DIAGNOSIS — I129 Hypertensive chronic kidney disease with stage 1 through stage 4 chronic kidney disease, or unspecified chronic kidney disease: Secondary | ICD-10-CM | POA: Diagnosis not present

## 2022-04-04 DIAGNOSIS — R634 Abnormal weight loss: Secondary | ICD-10-CM | POA: Diagnosis not present

## 2022-04-04 DIAGNOSIS — J8410 Pulmonary fibrosis, unspecified: Secondary | ICD-10-CM | POA: Diagnosis not present

## 2022-04-04 DIAGNOSIS — I739 Peripheral vascular disease, unspecified: Secondary | ICD-10-CM | POA: Diagnosis not present

## 2022-04-05 DIAGNOSIS — I739 Peripheral vascular disease, unspecified: Secondary | ICD-10-CM | POA: Diagnosis not present

## 2022-04-05 DIAGNOSIS — R634 Abnormal weight loss: Secondary | ICD-10-CM | POA: Diagnosis not present

## 2022-04-05 DIAGNOSIS — I129 Hypertensive chronic kidney disease with stage 1 through stage 4 chronic kidney disease, or unspecified chronic kidney disease: Secondary | ICD-10-CM | POA: Diagnosis not present

## 2022-04-05 DIAGNOSIS — N1831 Chronic kidney disease, stage 3a: Secondary | ICD-10-CM | POA: Diagnosis not present

## 2022-04-05 DIAGNOSIS — R630 Anorexia: Secondary | ICD-10-CM | POA: Diagnosis not present

## 2022-04-05 DIAGNOSIS — J8410 Pulmonary fibrosis, unspecified: Secondary | ICD-10-CM | POA: Diagnosis not present

## 2022-04-07 DIAGNOSIS — J8410 Pulmonary fibrosis, unspecified: Secondary | ICD-10-CM | POA: Diagnosis not present

## 2022-04-07 DIAGNOSIS — I739 Peripheral vascular disease, unspecified: Secondary | ICD-10-CM | POA: Diagnosis not present

## 2022-04-07 DIAGNOSIS — R634 Abnormal weight loss: Secondary | ICD-10-CM | POA: Diagnosis not present

## 2022-04-07 DIAGNOSIS — N1831 Chronic kidney disease, stage 3a: Secondary | ICD-10-CM | POA: Diagnosis not present

## 2022-04-07 DIAGNOSIS — R630 Anorexia: Secondary | ICD-10-CM | POA: Diagnosis not present

## 2022-04-07 DIAGNOSIS — I129 Hypertensive chronic kidney disease with stage 1 through stage 4 chronic kidney disease, or unspecified chronic kidney disease: Secondary | ICD-10-CM | POA: Diagnosis not present

## 2022-04-10 DIAGNOSIS — I129 Hypertensive chronic kidney disease with stage 1 through stage 4 chronic kidney disease, or unspecified chronic kidney disease: Secondary | ICD-10-CM | POA: Diagnosis not present

## 2022-04-10 DIAGNOSIS — R630 Anorexia: Secondary | ICD-10-CM | POA: Diagnosis not present

## 2022-04-10 DIAGNOSIS — R634 Abnormal weight loss: Secondary | ICD-10-CM | POA: Diagnosis not present

## 2022-04-10 DIAGNOSIS — J8410 Pulmonary fibrosis, unspecified: Secondary | ICD-10-CM | POA: Diagnosis not present

## 2022-04-10 DIAGNOSIS — I739 Peripheral vascular disease, unspecified: Secondary | ICD-10-CM | POA: Diagnosis not present

## 2022-04-10 DIAGNOSIS — N1831 Chronic kidney disease, stage 3a: Secondary | ICD-10-CM | POA: Diagnosis not present

## 2022-04-11 DIAGNOSIS — J8410 Pulmonary fibrosis, unspecified: Secondary | ICD-10-CM | POA: Diagnosis not present

## 2022-04-11 DIAGNOSIS — I129 Hypertensive chronic kidney disease with stage 1 through stage 4 chronic kidney disease, or unspecified chronic kidney disease: Secondary | ICD-10-CM | POA: Diagnosis not present

## 2022-04-11 DIAGNOSIS — R634 Abnormal weight loss: Secondary | ICD-10-CM | POA: Diagnosis not present

## 2022-04-11 DIAGNOSIS — I739 Peripheral vascular disease, unspecified: Secondary | ICD-10-CM | POA: Diagnosis not present

## 2022-04-11 DIAGNOSIS — N1831 Chronic kidney disease, stage 3a: Secondary | ICD-10-CM | POA: Diagnosis not present

## 2022-04-11 DIAGNOSIS — R630 Anorexia: Secondary | ICD-10-CM | POA: Diagnosis not present

## 2022-04-12 DIAGNOSIS — N1831 Chronic kidney disease, stage 3a: Secondary | ICD-10-CM | POA: Diagnosis not present

## 2022-04-12 DIAGNOSIS — J8410 Pulmonary fibrosis, unspecified: Secondary | ICD-10-CM | POA: Diagnosis not present

## 2022-04-12 DIAGNOSIS — R634 Abnormal weight loss: Secondary | ICD-10-CM | POA: Diagnosis not present

## 2022-04-12 DIAGNOSIS — I739 Peripheral vascular disease, unspecified: Secondary | ICD-10-CM | POA: Diagnosis not present

## 2022-04-12 DIAGNOSIS — R630 Anorexia: Secondary | ICD-10-CM | POA: Diagnosis not present

## 2022-04-12 DIAGNOSIS — I129 Hypertensive chronic kidney disease with stage 1 through stage 4 chronic kidney disease, or unspecified chronic kidney disease: Secondary | ICD-10-CM | POA: Diagnosis not present

## 2022-04-14 DIAGNOSIS — I129 Hypertensive chronic kidney disease with stage 1 through stage 4 chronic kidney disease, or unspecified chronic kidney disease: Secondary | ICD-10-CM | POA: Diagnosis not present

## 2022-04-14 DIAGNOSIS — N1831 Chronic kidney disease, stage 3a: Secondary | ICD-10-CM | POA: Diagnosis not present

## 2022-04-14 DIAGNOSIS — J8410 Pulmonary fibrosis, unspecified: Secondary | ICD-10-CM | POA: Diagnosis not present

## 2022-04-14 DIAGNOSIS — R634 Abnormal weight loss: Secondary | ICD-10-CM | POA: Diagnosis not present

## 2022-04-14 DIAGNOSIS — R630 Anorexia: Secondary | ICD-10-CM | POA: Diagnosis not present

## 2022-04-14 DIAGNOSIS — I739 Peripheral vascular disease, unspecified: Secondary | ICD-10-CM | POA: Diagnosis not present

## 2022-04-18 ENCOUNTER — Telehealth: Payer: Self-pay | Admitting: Primary Care

## 2022-04-18 DIAGNOSIS — R634 Abnormal weight loss: Secondary | ICD-10-CM | POA: Diagnosis not present

## 2022-04-18 DIAGNOSIS — I739 Peripheral vascular disease, unspecified: Secondary | ICD-10-CM | POA: Diagnosis not present

## 2022-04-18 DIAGNOSIS — I129 Hypertensive chronic kidney disease with stage 1 through stage 4 chronic kidney disease, or unspecified chronic kidney disease: Secondary | ICD-10-CM | POA: Diagnosis not present

## 2022-04-18 DIAGNOSIS — R630 Anorexia: Secondary | ICD-10-CM | POA: Diagnosis not present

## 2022-04-18 DIAGNOSIS — J8410 Pulmonary fibrosis, unspecified: Secondary | ICD-10-CM | POA: Diagnosis not present

## 2022-04-18 DIAGNOSIS — N1831 Chronic kidney disease, stage 3a: Secondary | ICD-10-CM | POA: Diagnosis not present

## 2022-04-18 NOTE — Telephone Encounter (Signed)
Spoke with patient's daughter to schedule AWVS.  She explained her mom is in hospice care but still wanted to do the awvs with [facility] on Apr 20, 2022.   She would like to know if the patient needs to keep appointment scheduled 05/02/2022 for her CPE.

## 2022-04-18 NOTE — Telephone Encounter (Signed)
Notified patients daughter she isn't due for CPE till 10/2022. Canceled 11/14 appointment

## 2022-04-18 NOTE — Telephone Encounter (Signed)
She's not due for follow up with me until May 2024.

## 2022-04-19 DIAGNOSIS — H9190 Unspecified hearing loss, unspecified ear: Secondary | ICD-10-CM | POA: Diagnosis not present

## 2022-04-19 DIAGNOSIS — J8410 Pulmonary fibrosis, unspecified: Secondary | ICD-10-CM | POA: Diagnosis not present

## 2022-04-19 DIAGNOSIS — R630 Anorexia: Secondary | ICD-10-CM | POA: Diagnosis not present

## 2022-04-19 DIAGNOSIS — H04129 Dry eye syndrome of unspecified lacrimal gland: Secondary | ICD-10-CM | POA: Diagnosis not present

## 2022-04-19 DIAGNOSIS — Z681 Body mass index (BMI) 19 or less, adult: Secondary | ICD-10-CM | POA: Diagnosis not present

## 2022-04-19 DIAGNOSIS — K449 Diaphragmatic hernia without obstruction or gangrene: Secondary | ICD-10-CM | POA: Diagnosis not present

## 2022-04-19 DIAGNOSIS — Z87440 Personal history of urinary (tract) infections: Secondary | ICD-10-CM | POA: Diagnosis not present

## 2022-04-19 DIAGNOSIS — I739 Peripheral vascular disease, unspecified: Secondary | ICD-10-CM | POA: Diagnosis not present

## 2022-04-19 DIAGNOSIS — N1831 Chronic kidney disease, stage 3a: Secondary | ICD-10-CM | POA: Diagnosis not present

## 2022-04-19 DIAGNOSIS — F32A Depression, unspecified: Secondary | ICD-10-CM | POA: Diagnosis not present

## 2022-04-19 DIAGNOSIS — E039 Hypothyroidism, unspecified: Secondary | ICD-10-CM | POA: Diagnosis not present

## 2022-04-19 DIAGNOSIS — D631 Anemia in chronic kidney disease: Secondary | ICD-10-CM | POA: Diagnosis not present

## 2022-04-19 DIAGNOSIS — E43 Unspecified severe protein-calorie malnutrition: Secondary | ICD-10-CM | POA: Diagnosis not present

## 2022-04-19 DIAGNOSIS — R627 Adult failure to thrive: Secondary | ICD-10-CM | POA: Diagnosis not present

## 2022-04-19 DIAGNOSIS — L89151 Pressure ulcer of sacral region, stage 1: Secondary | ICD-10-CM | POA: Diagnosis not present

## 2022-04-19 DIAGNOSIS — H409 Unspecified glaucoma: Secondary | ICD-10-CM | POA: Diagnosis not present

## 2022-04-19 DIAGNOSIS — I129 Hypertensive chronic kidney disease with stage 1 through stage 4 chronic kidney disease, or unspecified chronic kidney disease: Secondary | ICD-10-CM | POA: Diagnosis not present

## 2022-04-19 DIAGNOSIS — G250 Essential tremor: Secondary | ICD-10-CM | POA: Diagnosis not present

## 2022-04-19 DIAGNOSIS — R634 Abnormal weight loss: Secondary | ICD-10-CM | POA: Diagnosis not present

## 2022-04-19 DIAGNOSIS — J439 Emphysema, unspecified: Secondary | ICD-10-CM | POA: Diagnosis not present

## 2022-04-19 DIAGNOSIS — E8809 Other disorders of plasma-protein metabolism, not elsewhere classified: Secondary | ICD-10-CM | POA: Diagnosis not present

## 2022-04-19 DIAGNOSIS — C4490 Unspecified malignant neoplasm of skin, unspecified: Secondary | ICD-10-CM | POA: Diagnosis not present

## 2022-04-20 ENCOUNTER — Ambulatory Visit (INDEPENDENT_AMBULATORY_CARE_PROVIDER_SITE_OTHER): Payer: Medicare Other

## 2022-04-20 VITALS — Ht 59.0 in | Wt 107.0 lb

## 2022-04-20 DIAGNOSIS — Z0000 Encounter for general adult medical examination without abnormal findings: Secondary | ICD-10-CM | POA: Diagnosis not present

## 2022-04-20 NOTE — Progress Notes (Signed)
Virtual Visit via Telephone Note  I connected with  Nicole Downs on 04/20/22 at  1:00 PM EDT by telephone and verified that I am speaking with the correct person using two identifiers.  Location: Patient: home Provider: [place] Persons participating in the virtual visit: [place]   I discussed the limitations, risks, security and privacy concerns of performing an evaluation and management service by telephone and the availability of in person appointments. The patient expressed understanding and agreed to proceed.  Interactive audio and video telecommunications were attempted between this nurse and patient, however failed, due to patient having technical difficulties OR patient did not have access to video capability.  We continued and completed visit with audio only.  Some vital signs may be absent or patient reported.   Dionisio David, LPN  Subjective:   Nicole Downs is a 88 y.o. female who presents for Medicare Annual (Subsequent) preventive examination.  Review of Systems     Cardiac Risk Factors include: advanced age (>90mn, >>63women);hypertension     Objective:    There were no vitals filed for this visit. There is no height or weight on file to calculate BMI.     04/20/2022    1:07 PM 06/02/2021    2:31 PM 04/18/2021    9:11 AM 04/05/2021    8:56 PM 04/16/2020    9:07 AM 04/15/2019   10:11 AM 05/01/2018    9:27 PM  Advanced Directives  Does Patient Have a Medical Advance Directive? Yes Yes Yes No No Yes Yes  Type of AParamedicof ABroadusLiving will Healthcare Power of [place]Harperof [place] will [place] Does patient want to make changes to medical advance directive? No - Patient declined  Yes (MAU/Ambulatory/Procedural Areas - Information given)      Copy of HBrownsin Chart? Yes - validated most recent  copy scanned in chart (See row information)  No - copy requested   No - copy requested No - copy requested  Would patient like information on creating a medical advance directive?   Yes (MAU/Ambulatory/Procedural Areas - Information given) No - Patient declined;No - Guardian declined No - Patient declined      Current Medications (verified) Outpatient Encounter Medications as of 04/20/2022  Medication Sig   Acetaminophen (TYLENOL PO) Take 1 tablet by mouth at bedtime.   busPIRone (BUSPAR) 5 MG tablet TAKE 1 TABLET (5 MG TOTAL) BY MOUTH 2 (TWO) TIMES DAILY. AS NEEDED FOR ANXIETY. (Patient taking differently: Take 5 mg by mouth 2 (two) times daily as needed (anxiety).)   Carboxymethylcellulose Sodium (REFRESH TEARS OP) Place 1 drop into both eyes in the morning and at bedtime.   levobunolol (BETAGAN) 0.5 % ophthalmic solution Place 1 drop into both eyes 2 (two) times daily.   levothyroxine (SYNTHROID) 25 MCG tablet Take 1 tablet by mouth every morning on an empty stomach with water only.  No food or other medications for 30 minutes.   traZODone (DESYREL) 50 MG tablet Take 50 mg by mouth at bedtime.   estradiol (ESTRACE) 0.1 MG/GM vaginal cream PLACE 1 APPLICATORFUL VAGINALLY 3 (THREE) TIMES A WEEK. FOR UTI PREVENTION. (Patient not taking: Reported on 04/20/2022)   memantine (NAMENDA) 5 MG tablet Take 1 tablet by mouth every morning and 2 tablets by mouth every evening for memory and behavior (Patient not taking: Reported on 04/20/2022)   QUEtiapine (SEROQUEL) 25 MG  tablet Take 25 mg by mouth at bedtime. (Patient not taking: Reported on 04/20/2022)   sertraline (ZOLOFT) 25 MG tablet TAKE 1 TABLET BY MOUTH AT BEDTIME FOR DEPRESSION (Patient not taking: Reported on 04/20/2022)   [DISCONTINUED] hydrALAZINE (APRESOLINE) 25 MG tablet Take 1 tablet (25 mg total) by mouth every 8 (eight) hours.   [DISCONTINUED] melatonin 1 MG TABS tablet Take 1 mg by mouth at bedtime.   No facility-administered encounter  medications on file as of 04/20/2022.    Allergies (verified) Augmentin [amoxicillin-pot clavulanate] and Amlodipine   History: Past Medical History:  Diagnosis Date   Actinic keratosis    Acute confusion    Acute encephalopathy 04/06/2021   Arthritis    Basal cell carcinoma 11/01/2015   L lat ankle   Basal cell carcinoma 12/13/2015   L dorsum foot   Basal cell carcinoma 09/12/2017   nasal root, R nasal bridge, L nasal bridge, R mandible infra auricular   Basal cell carcinoma 10/24/2017   R lateral foot   Basal cell carcinoma 12/05/2017   L dorsum foot   Basal cell carcinoma 11/27/2018   R of midline mid forehead   Basal cell carcinoma 06/25/2019   nasal bridge   Basal cell carcinoma 12/01/2019   Junction of right mandible and earlobe. Nodular pattern.   Basal cell carcinoma 04/19/2021   right inferior ear at ear lobe, EDC   Emphysema lung ([place])    Glaucoma    Hypertension    Hyponatremia    Hypothyroidism    Memory loss    Poor peripheral circulation    Squamous cell carcinoma in situ (SCCIS) 09/15/2021   right medial clavicle, EDC   Squamous cell carcinoma of skin 09/20/2015   R prox mandible/in situ   Squamous cell carcinoma of skin 02/01/2016   nose   Squamous cell carcinoma of skin 10/24/2017   R lateral knee/in situ, R lat ankle/in situ   Squamous cell carcinoma of skin 12/05/2017   L medial calf/in situ   Squamous cell carcinoma of skin 12/01/2019   Right neck sup. SCCis, hypertrophic EDC, 03/30/21 LN2 for recurrence   Squamous cell carcinoma of skin 12/01/2019   Right neck inf.  SCCis, hypertrophic EDC, 03/30/21 LN2 fo recurrence   Squamous cell carcinoma of skin 03/02/2020   Left upper arm, proximal. SCCis   Squamous cell carcinoma of skin 03/02/2020   Left upper arm, distal. SCCis   Tremors of nervous system    Past Surgical History:  Procedure Laterality Date   BREAST BIOPSY     Family History  Problem Relation Age of Onset   Heart attack  Mother    Other Father        unsure - she was only 20 years old when he died   Social History   Socioeconomic History   Marital status: Widowed    Spouse name: Not on file   Number of children: 1   Years of education: 11th grade   Highest education level: Not on file  Occupational History   Occupation: Retired  Tobacco Use   Smoking status: Never   Smokeless tobacco: Never  Vaping Use   Vaping Use: Never used  Substance and Sexual Activity   Alcohol use: No    Alcohol/week: 0.0 standard drinks of alcohol   Drug use: No   Sexual activity: Not Currently  Other Topics Concern   Not on file  Social History Narrative   Lives with daughter and son-in-law.   Right-handed.  Drinks one soda per day.   Social Determinants of Health   Financial Resource Strain: Low Risk  (04/20/2022)   Overall Financial Resource Strain (CARDIA)    Difficulty of Paying Living Expenses: Not hard at all  Food Insecurity: No Food Insecurity (04/20/2022)   Hunger Vital Sign    Worried About Running Out of Food in the Last Year: Never true    Ran Out of Food in the Last Year: Never true  Transportation Needs: No Transportation Needs (04/20/2022)   PRAPARE - Hydrologist (Medical): No    Lack of Transportation (Non-Medical): No  Physical Activity: Inactive (04/20/2022)   Exercise Vital Sign    Days of Exercise per Week: 0 days    Minutes of Exercise per Session: 0 min  Stress: No Stress Concern Present (04/20/2022)   [place]    Feeling of Stress : Not at all  Social Connections: Socially Isolated (04/20/2022)   Social Connection and Isolation Panel [NHANES]    Frequency of Communication with Friends and Family: Never    Frequency of Social Gatherings with Friends and Family: Twice a week    Attends Religious Services: More than 4 times per year    Active Member of [employer] or Organizations: No    Attends  Archivist Meetings: Never    Marital Status: Widowed    Tobacco Counseling Counseling given: Not Answered   Clinical Intake:  Pre-visit preparation completed: Yes  Pain : No/denies pain     Nutritional Risks: None Diabetes: No  How often do you need to have someone help you when you read instructions, pamphlets, or other written materials from your doctor or pharmacy?: 1 - Never  Diabetic?no  Interpreter Needed?: No  Information entered by :: Kirke Shaggy, LPN   Activities of Daily Living    04/20/2022    1:09 PM 04/20/2022   12:46 PM  In your present state of health, do you have any difficulty performing the following activities:  Hearing? 1 1  Vision? 1 1  Difficulty concentrating or making decisions? 1 1  Walking or climbing stairs? 1 1  Dressing or bathing? 1 1  Doing errands, shopping? 1 1  Preparing Food and eating ? Y Y  Using the Toilet? Y Y  In the past six months, have you accidently leaked urine? Y Y  Do you have problems with loss of bowel control? Y Y  Managing your Medications? Y Y  Managing your Finances? Tempie Donning  Housekeeping or managing your Housekeeping? Tempie Donning    Patient Care Team: Pleas Koch, NP as PCP - General (Internal Medicine) Debbora Dus, [facility] as Pharmacist (Pharmacist)  Indicate any recent Medical Services you may have received from other than [facility] providers in the past year (date may be approximate).     Assessment:   This is a routine wellness examination for [place].  Hearing/Vision screen Hearing Screening - Comments:: Won't wear hearing aids Vision Screening - Comments:: Wears glasses- [facility]  Dietary issues and exercise activities discussed: Current Exercise Habits: The patient does not participate in regular exercise at present   Goals Addressed             This Visit's Progress    DIET - EAT MORE FRUITS AND VEGETABLES         Depression Screen    04/20/2022    1:05 PM 11/22/2021     9:27 AM  10/27/2021    8:00 AM 04/18/2021    9:16 AM 04/16/2020    9:09 AM 04/15/2019   10:12 AM 04/10/2018   10:05 AM  PHQ 2/9 Scores  PHQ - 2 Score 2   0 0 0 4  PHQ- 9 Score     0 0 10  Exception Documentation  Medical reason Medical reason        Fall Risk    04/20/2022    1:08 PM 04/20/2022   12:46 PM 04/18/2021    9:15 AM 04/16/2020    9:08 AM 04/15/2019   10:12 AM  Fall Risk   Falls in the past year? 1 1 0 1 0  Comment    fell when getting out of chair   Number falls in past yr: 1 1 0 0 0  Injury with Fall? 1 1 0 0 0  Risk for fall due to : History of fall(s)  Impaired balance/gait Medication side effect;Impaired balance/gait;Impaired mobility Medication side effect;Impaired balance/gait  Risk for fall due to: Comment   uses walker    Follow up Falls evaluation completed;Falls prevention discussed  Falls prevention discussed Falls evaluation completed;Falls prevention discussed Falls evaluation completed;Falls prevention discussed    FALL RISK PREVENTION PERTAINING TO THE HOME:  Any stairs in or around the home? Yes  If so, are there any without handrails? No  Home free of loose throw rugs in walkways, pet beds, electrical cords, etc? Yes  Adequate lighting in your home to reduce risk of falls? Yes   ASSISTIVE DEVICES UTILIZED TO PREVENT FALLS:  Life alert? No  Use of a cane, walker or w/c? Yes  Grab bars in the bathroom? No  Shower chair or bench in shower? Yes  Elevated toilet seat or a handicapped toilet? Yes     Cognitive Function:unable to complete PT IS ALERT      04/16/2020    9:12 AM 04/15/2019   10:15 AM  MMSE - Mini Mental State Exam  Not completed: Unable to complete Unable to complete        Immunizations Immunization History  Administered Date(s) Administered   Fluad Quad(high Dose 65+) 04/16/2019, 04/20/2020, 04/29/2021   Influenza,inj,Quad PF,6+ Mos 04/10/2016, 03/29/2017, 04/10/2018   PFIZER(Purple Top)SARS-COV-2 Vaccination  07/09/2019, 07/30/2019, 03/18/2020   Pneumococcal Conjugate-13 03/19/2014   Pneumococcal Polysaccharide-23 03/20/2015   Tdap 06/19/2008, 04/16/2019   Zoster Recombinat (Shingrix) 05/06/2021, 08/03/2021    TDAP status: Up to date  Flu Vaccine status: Up to date  Pneumococcal vaccine status: Up to date  Covid-19 vaccine status: Completed vaccines  Qualifies for Shingles Vaccine? Yes   Zostavax completed No   Shingrix Completed?: Yes  Screening Tests Health Maintenance  Topic Date Due   INFLUENZA VACCINE  01/17/2022   TETANUS/TDAP  04/15/2029   Pneumonia Vaccine 20+ Years old  Completed   DEXA SCAN  Completed   Zoster Vaccines- Shingrix  Completed   HPV VACCINES  Aged Out   COVID-19 Vaccine  Discontinued    Health Maintenance  Health Maintenance Due  Topic Date Due   INFLUENZA VACCINE  01/17/2022    Colorectal cancer screening: No longer required.   Mammogram status: No longer required due to age.  Lung Cancer Screening: (Low Dose CT Chest recommended if Age 61-80 years, 30 pack-year currently smoking OR have quit w/in 15years.) does not qualify.   Additional Screening:  Hepatitis C Screening: does not qualify; Completed no  Vision Screening: Recommended annual ophthalmology exams for early detection of glaucoma and  other disorders of the eye. Is the patient up to date with their annual eye exam?  Yes  Who is the provider or what is the name of the office in which the patient attends annual eye exams? [facility] If pt is not established with a provider, would they like to be referred to a provider to establish care? No .   Dental Screening: Recommended annual dental exams for proper oral hygiene  Community Resource Referral / Chronic Care Management: CRR required this visit?  No   CCM required this visit?  No      Plan:     I have personally reviewed and noted the following in the patient's chart:   Medical and social history Use of alcohol, tobacco  or illicit drugs  Current medications and supplements including opioid prescriptions. Patient is not currently taking opioid prescriptions. Functional ability and status Nutritional status Physical activity Advanced directives List of other physicians Hospitalizations, surgeries, and ER visits in previous 12 months Vitals Screenings to include cognitive, depression, and falls Referrals and appointments  In addition, I have reviewed and discussed with patient certain preventive protocols, quality metrics, and best practice recommendations. A written personalized care plan for preventive services as well as general preventive health recommendations were provided to patient.     Dionisio David, LPN   16/06/958   Nurse Notes: none

## 2022-04-20 NOTE — Patient Instructions (Signed)
[name] , Thank you for taking time to come for your Medicare Wellness Visit. I appreciate your ongoing commitment to your health goals. Please review the following plan we discussed and let me know if I can assist you in the future.   Screening recommendations/referrals: Colonoscopy: aged out Mammogram: aged out Bone Density: aged out Recommended yearly ophthalmology/optometry visit for glaucoma screening and checkup Recommended yearly dental visit for hygiene and checkup  Vaccinations: Influenza vaccine: 11/86/22 Pneumococcal vaccine: 03/20/15 Tdap vaccine: 04/16/19 Shingles vaccine: Shingrix 05/06/21, 08/03/21   Covid-19:07/09/19, 07/30/19, 03/18/20  Advanced directives: yes  Conditions/risks identified: none  Next appointment: Follow up in one year for your annual wellness visit 04/23/23 @ 8:45 am by phone   Preventive Care 86 Years and Older, Female Preventive care refers to lifestyle choices and visits with your health care provider that can promote health and wellness. What does preventive care include? A yearly physical exam. This is also called an annual well check. Dental exams once or twice a year. Routine eye exams. Ask your health care provider how often you should have your eyes checked. Personal lifestyle choices, including: Daily care of your teeth and gums. Regular physical activity. Eating a healthy diet. Avoiding tobacco and drug use. Limiting alcohol use. Practicing safe sex. Taking low-dose aspirin every day. Taking vitamin and mineral supplements as recommended by your health care provider. What happens during an annual well check? The services and screenings done by your health care provider during your annual well check will depend on your age, overall health, lifestyle risk factors, and family history of disease. Counseling  Your health care provider may ask you questions about your: Alcohol use. Tobacco use. Drug use. Emotional well-being. Home  and relationship well-being. Sexual activity. Eating habits. History of falls. Memory and ability to understand (cognition). Work and work Statistician. Reproductive health. Screening  You may have the following tests or measurements: Height, weight, and BMI. Blood pressure. Lipid and cholesterol levels. These may be checked every 5 years, or more frequently if you are over 44 years old. Skin check. Lung cancer screening. You may have this screening every year starting at age 16 if you have a 30-pack-year history of smoking and currently smoke or have quit within the past 15 years. Fecal occult blood test (FOBT) of the stool. You may have this test every year starting at age 56. Flexible sigmoidoscopy or colonoscopy. You may have a sigmoidoscopy every 5 years or a colonoscopy every 10 years starting at age 2. Hepatitis C blood test. Hepatitis B blood test. Sexually transmitted disease (STD) testing. Diabetes screening. This is done by checking your blood sugar (glucose) after you have not eaten for a while (fasting). You may have this done every 1-3 years. Bone density scan. This is done to screen for osteoporosis. You may have this done starting at age 60. Mammogram. This may be done every 1-2 years. Talk to your health care provider about how often you should have regular mammograms. Talk with your health care provider about your test results, treatment options, and if necessary, the need for more tests. Vaccines  Your health care provider may recommend certain vaccines, such as: Influenza vaccine. This is recommended every year. Tetanus, diphtheria, and acellular pertussis (Tdap, Td) vaccine. You may need a Td booster every 10 years. Zoster vaccine. You may need this after age 42. Pneumococcal 13-valent conjugate (PCV13) vaccine. One dose is recommended after age 27. Pneumococcal polysaccharide (PPSV23) vaccine. One dose is recommended after age 58.  Talk to your health care provider  about which screenings and vaccines you need and how often you need them. This information is not intended to replace advice given to you by your health care provider. Make sure you discuss any questions you have with your health care provider. Document Released: 07/02/2015 Document Revised: 02/23/2016 Document Reviewed: 04/06/2015 Elsevier Interactive Patient Education  2017 [place] Prevention in the Home Falls can cause injuries. They can happen to people of all ages. There are many things you can do to make your home safe and to help prevent falls. What can I do on the outside of my home? Regularly fix the edges of walkways and driveways and fix any cracks. Remove anything that might make you trip as you walk through a door, such as a raised step or threshold. Trim any bushes or trees on the path to your home. Use bright outdoor lighting. Clear any walking paths of anything that might make someone trip, such as rocks or tools. Regularly check to see if handrails are loose or broken. Make sure that both sides of any steps have handrails. Any raised decks and porches should have guardrails on the edges. Have any leaves, snow, or ice cleared regularly. Use sand or salt on walking paths during winter. Clean up any spills in your garage right away. This includes oil or grease spills. What can I do in the bathroom? Use night lights. Install grab bars by the toilet and in the tub and shower. Do not use towel bars as grab bars. Use non-skid mats or decals in the tub or shower. If you need to sit down in the shower, use a plastic, non-slip stool. Keep the floor dry. Clean up any water that spills on the floor as soon as it happens. Remove soap buildup in the tub or shower regularly. Attach bath mats securely with double-sided non-slip rug tape. Do not have throw rugs and other things on the floor that can make you trip. What can I do in the bedroom? Use night lights. Make sure  that you have a light by your bed that is easy to reach. Do not use any sheets or blankets that are too big for your bed. They should not hang down onto the floor. Have a firm chair that has side arms. You can use this for support while you get dressed. Do not have throw rugs and other things on the floor that can make you trip. What can I do in the kitchen? Clean up any spills right away. Avoid walking on wet floors. Keep items that you use a lot in easy-to-reach places. If you need to reach something above you, use a strong step stool that has a grab bar. Keep electrical cords out of the way. Do not use floor polish or wax that makes floors slippery. If you must use wax, use non-skid floor wax. Do not have throw rugs and other things on the floor that can make you trip. What can I do with my stairs? Do not leave any items on the stairs. Make sure that there are handrails on both sides of the stairs and use them. Fix handrails that are broken or loose. Make sure that handrails are as long as the stairways. Check any carpeting to make sure that it is firmly attached to the stairs. Fix any carpet that is loose or worn. Avoid having throw rugs at the top or bottom of the stairs. If you do have throw  rugs, attach them to the floor with carpet tape. Make sure that you have a light switch at the top of the stairs and the bottom of the stairs. If you do not have them, ask someone to add them for you. What else can I do to help prevent falls? Wear shoes that: Do not have high heels. Have rubber bottoms. Are comfortable and fit you well. Are closed at the toe. Do not wear sandals. If you use a stepladder: Make sure that it is fully opened. Do not climb a closed stepladder. Make sure that both sides of the stepladder are locked into place. Ask someone to hold it for you, if possible. Clearly mark and make sure that you can see: Any grab bars or handrails. First and last steps. Where the edge of  each step is. Use tools that help you move around (mobility aids) if they are needed. These include: Canes. Walkers. Scooters. Crutches. Turn on the lights when you go into a dark area. Replace any light bulbs as soon as they burn out. Set up your furniture so you have a clear path. Avoid moving your furniture around. If any of your floors are uneven, fix them. If there are any pets around you, be aware of where they are. Review your medicines with your doctor. Some medicines can make you feel dizzy. This can increase your chance of falling. Ask your doctor what other things that you can do to help prevent falls. This information is not intended to replace advice given to you by your health care provider. Make sure you discuss any questions you have with your health care provider. Document Released: 04/01/2009 Document Revised: 11/11/2015 Document Reviewed: 07/10/2014 Elsevier Interactive Patient Education  2017 [employer].

## 2022-04-21 DIAGNOSIS — N1831 Chronic kidney disease, stage 3a: Secondary | ICD-10-CM | POA: Diagnosis not present

## 2022-04-21 DIAGNOSIS — I129 Hypertensive chronic kidney disease with stage 1 through stage 4 chronic kidney disease, or unspecified chronic kidney disease: Secondary | ICD-10-CM | POA: Diagnosis not present

## 2022-04-21 DIAGNOSIS — R630 Anorexia: Secondary | ICD-10-CM | POA: Diagnosis not present

## 2022-04-21 DIAGNOSIS — J8410 Pulmonary fibrosis, unspecified: Secondary | ICD-10-CM | POA: Diagnosis not present

## 2022-04-21 DIAGNOSIS — I739 Peripheral vascular disease, unspecified: Secondary | ICD-10-CM | POA: Diagnosis not present

## 2022-04-21 DIAGNOSIS — R634 Abnormal weight loss: Secondary | ICD-10-CM | POA: Diagnosis not present

## 2022-04-25 DIAGNOSIS — R630 Anorexia: Secondary | ICD-10-CM | POA: Diagnosis not present

## 2022-04-25 DIAGNOSIS — J8410 Pulmonary fibrosis, unspecified: Secondary | ICD-10-CM | POA: Diagnosis not present

## 2022-04-25 DIAGNOSIS — N1831 Chronic kidney disease, stage 3a: Secondary | ICD-10-CM | POA: Diagnosis not present

## 2022-04-25 DIAGNOSIS — I129 Hypertensive chronic kidney disease with stage 1 through stage 4 chronic kidney disease, or unspecified chronic kidney disease: Secondary | ICD-10-CM | POA: Diagnosis not present

## 2022-04-25 DIAGNOSIS — I739 Peripheral vascular disease, unspecified: Secondary | ICD-10-CM | POA: Diagnosis not present

## 2022-04-25 DIAGNOSIS — R634 Abnormal weight loss: Secondary | ICD-10-CM | POA: Diagnosis not present

## 2022-04-26 DIAGNOSIS — N1831 Chronic kidney disease, stage 3a: Secondary | ICD-10-CM | POA: Diagnosis not present

## 2022-04-26 DIAGNOSIS — R634 Abnormal weight loss: Secondary | ICD-10-CM | POA: Diagnosis not present

## 2022-04-26 DIAGNOSIS — I129 Hypertensive chronic kidney disease with stage 1 through stage 4 chronic kidney disease, or unspecified chronic kidney disease: Secondary | ICD-10-CM | POA: Diagnosis not present

## 2022-04-26 DIAGNOSIS — J8410 Pulmonary fibrosis, unspecified: Secondary | ICD-10-CM | POA: Diagnosis not present

## 2022-04-26 DIAGNOSIS — I739 Peripheral vascular disease, unspecified: Secondary | ICD-10-CM | POA: Diagnosis not present

## 2022-04-26 DIAGNOSIS — R630 Anorexia: Secondary | ICD-10-CM | POA: Diagnosis not present

## 2022-04-28 DIAGNOSIS — R634 Abnormal weight loss: Secondary | ICD-10-CM | POA: Diagnosis not present

## 2022-04-28 DIAGNOSIS — I739 Peripheral vascular disease, unspecified: Secondary | ICD-10-CM | POA: Diagnosis not present

## 2022-04-28 DIAGNOSIS — N1831 Chronic kidney disease, stage 3a: Secondary | ICD-10-CM | POA: Diagnosis not present

## 2022-04-28 DIAGNOSIS — I129 Hypertensive chronic kidney disease with stage 1 through stage 4 chronic kidney disease, or unspecified chronic kidney disease: Secondary | ICD-10-CM | POA: Diagnosis not present

## 2022-04-28 DIAGNOSIS — R630 Anorexia: Secondary | ICD-10-CM | POA: Diagnosis not present

## 2022-04-28 DIAGNOSIS — J8410 Pulmonary fibrosis, unspecified: Secondary | ICD-10-CM | POA: Diagnosis not present

## 2022-05-02 ENCOUNTER — Encounter: Payer: Medicare Other | Admitting: Primary Care

## 2022-05-02 DIAGNOSIS — J8410 Pulmonary fibrosis, unspecified: Secondary | ICD-10-CM | POA: Diagnosis not present

## 2022-05-02 DIAGNOSIS — I739 Peripheral vascular disease, unspecified: Secondary | ICD-10-CM | POA: Diagnosis not present

## 2022-05-02 DIAGNOSIS — N1831 Chronic kidney disease, stage 3a: Secondary | ICD-10-CM | POA: Diagnosis not present

## 2022-05-02 DIAGNOSIS — I129 Hypertensive chronic kidney disease with stage 1 through stage 4 chronic kidney disease, or unspecified chronic kidney disease: Secondary | ICD-10-CM | POA: Diagnosis not present

## 2022-05-02 DIAGNOSIS — R630 Anorexia: Secondary | ICD-10-CM | POA: Diagnosis not present

## 2022-05-02 DIAGNOSIS — R634 Abnormal weight loss: Secondary | ICD-10-CM | POA: Diagnosis not present

## 2022-05-03 DIAGNOSIS — I739 Peripheral vascular disease, unspecified: Secondary | ICD-10-CM | POA: Diagnosis not present

## 2022-05-03 DIAGNOSIS — I129 Hypertensive chronic kidney disease with stage 1 through stage 4 chronic kidney disease, or unspecified chronic kidney disease: Secondary | ICD-10-CM | POA: Diagnosis not present

## 2022-05-03 DIAGNOSIS — J8410 Pulmonary fibrosis, unspecified: Secondary | ICD-10-CM | POA: Diagnosis not present

## 2022-05-03 DIAGNOSIS — R630 Anorexia: Secondary | ICD-10-CM | POA: Diagnosis not present

## 2022-05-03 DIAGNOSIS — N1831 Chronic kidney disease, stage 3a: Secondary | ICD-10-CM | POA: Diagnosis not present

## 2022-05-03 DIAGNOSIS — R634 Abnormal weight loss: Secondary | ICD-10-CM | POA: Diagnosis not present

## 2022-05-05 DIAGNOSIS — N1831 Chronic kidney disease, stage 3a: Secondary | ICD-10-CM | POA: Diagnosis not present

## 2022-05-05 DIAGNOSIS — J8410 Pulmonary fibrosis, unspecified: Secondary | ICD-10-CM | POA: Diagnosis not present

## 2022-05-05 DIAGNOSIS — I739 Peripheral vascular disease, unspecified: Secondary | ICD-10-CM | POA: Diagnosis not present

## 2022-05-05 DIAGNOSIS — R630 Anorexia: Secondary | ICD-10-CM | POA: Diagnosis not present

## 2022-05-05 DIAGNOSIS — I129 Hypertensive chronic kidney disease with stage 1 through stage 4 chronic kidney disease, or unspecified chronic kidney disease: Secondary | ICD-10-CM | POA: Diagnosis not present

## 2022-05-05 DIAGNOSIS — R634 Abnormal weight loss: Secondary | ICD-10-CM | POA: Diagnosis not present

## 2022-05-09 DIAGNOSIS — N1831 Chronic kidney disease, stage 3a: Secondary | ICD-10-CM | POA: Diagnosis not present

## 2022-05-09 DIAGNOSIS — I739 Peripheral vascular disease, unspecified: Secondary | ICD-10-CM | POA: Diagnosis not present

## 2022-05-09 DIAGNOSIS — R634 Abnormal weight loss: Secondary | ICD-10-CM | POA: Diagnosis not present

## 2022-05-09 DIAGNOSIS — R630 Anorexia: Secondary | ICD-10-CM | POA: Diagnosis not present

## 2022-05-09 DIAGNOSIS — I129 Hypertensive chronic kidney disease with stage 1 through stage 4 chronic kidney disease, or unspecified chronic kidney disease: Secondary | ICD-10-CM | POA: Diagnosis not present

## 2022-05-09 DIAGNOSIS — J8410 Pulmonary fibrosis, unspecified: Secondary | ICD-10-CM | POA: Diagnosis not present

## 2022-05-10 DIAGNOSIS — R630 Anorexia: Secondary | ICD-10-CM | POA: Diagnosis not present

## 2022-05-10 DIAGNOSIS — I739 Peripheral vascular disease, unspecified: Secondary | ICD-10-CM | POA: Diagnosis not present

## 2022-05-10 DIAGNOSIS — I129 Hypertensive chronic kidney disease with stage 1 through stage 4 chronic kidney disease, or unspecified chronic kidney disease: Secondary | ICD-10-CM | POA: Diagnosis not present

## 2022-05-10 DIAGNOSIS — J8410 Pulmonary fibrosis, unspecified: Secondary | ICD-10-CM | POA: Diagnosis not present

## 2022-05-10 DIAGNOSIS — R634 Abnormal weight loss: Secondary | ICD-10-CM | POA: Diagnosis not present

## 2022-05-10 DIAGNOSIS — N1831 Chronic kidney disease, stage 3a: Secondary | ICD-10-CM | POA: Diagnosis not present

## 2022-05-12 DIAGNOSIS — J8410 Pulmonary fibrosis, unspecified: Secondary | ICD-10-CM | POA: Diagnosis not present

## 2022-05-12 DIAGNOSIS — I129 Hypertensive chronic kidney disease with stage 1 through stage 4 chronic kidney disease, or unspecified chronic kidney disease: Secondary | ICD-10-CM | POA: Diagnosis not present

## 2022-05-12 DIAGNOSIS — R634 Abnormal weight loss: Secondary | ICD-10-CM | POA: Diagnosis not present

## 2022-05-12 DIAGNOSIS — I739 Peripheral vascular disease, unspecified: Secondary | ICD-10-CM | POA: Diagnosis not present

## 2022-05-12 DIAGNOSIS — N1831 Chronic kidney disease, stage 3a: Secondary | ICD-10-CM | POA: Diagnosis not present

## 2022-05-12 DIAGNOSIS — R630 Anorexia: Secondary | ICD-10-CM | POA: Diagnosis not present

## 2022-05-16 DIAGNOSIS — J8410 Pulmonary fibrosis, unspecified: Secondary | ICD-10-CM | POA: Diagnosis not present

## 2022-05-16 DIAGNOSIS — I129 Hypertensive chronic kidney disease with stage 1 through stage 4 chronic kidney disease, or unspecified chronic kidney disease: Secondary | ICD-10-CM | POA: Diagnosis not present

## 2022-05-16 DIAGNOSIS — R630 Anorexia: Secondary | ICD-10-CM | POA: Diagnosis not present

## 2022-05-16 DIAGNOSIS — R634 Abnormal weight loss: Secondary | ICD-10-CM | POA: Diagnosis not present

## 2022-05-16 DIAGNOSIS — N1831 Chronic kidney disease, stage 3a: Secondary | ICD-10-CM | POA: Diagnosis not present

## 2022-05-16 DIAGNOSIS — I739 Peripheral vascular disease, unspecified: Secondary | ICD-10-CM | POA: Diagnosis not present

## 2022-05-17 DIAGNOSIS — R630 Anorexia: Secondary | ICD-10-CM | POA: Diagnosis not present

## 2022-05-17 DIAGNOSIS — J8410 Pulmonary fibrosis, unspecified: Secondary | ICD-10-CM | POA: Diagnosis not present

## 2022-05-17 DIAGNOSIS — N1831 Chronic kidney disease, stage 3a: Secondary | ICD-10-CM | POA: Diagnosis not present

## 2022-05-17 DIAGNOSIS — R634 Abnormal weight loss: Secondary | ICD-10-CM | POA: Diagnosis not present

## 2022-05-17 DIAGNOSIS — I129 Hypertensive chronic kidney disease with stage 1 through stage 4 chronic kidney disease, or unspecified chronic kidney disease: Secondary | ICD-10-CM | POA: Diagnosis not present

## 2022-05-17 DIAGNOSIS — I739 Peripheral vascular disease, unspecified: Secondary | ICD-10-CM | POA: Diagnosis not present

## 2022-05-19 DIAGNOSIS — F32A Depression, unspecified: Secondary | ICD-10-CM | POA: Diagnosis not present

## 2022-05-19 DIAGNOSIS — N1831 Chronic kidney disease, stage 3a: Secondary | ICD-10-CM | POA: Diagnosis not present

## 2022-05-19 DIAGNOSIS — C4490 Unspecified malignant neoplasm of skin, unspecified: Secondary | ICD-10-CM | POA: Diagnosis not present

## 2022-05-19 DIAGNOSIS — K449 Diaphragmatic hernia without obstruction or gangrene: Secondary | ICD-10-CM | POA: Diagnosis not present

## 2022-05-19 DIAGNOSIS — G250 Essential tremor: Secondary | ICD-10-CM | POA: Diagnosis not present

## 2022-05-19 DIAGNOSIS — I739 Peripheral vascular disease, unspecified: Secondary | ICD-10-CM | POA: Diagnosis not present

## 2022-05-19 DIAGNOSIS — I129 Hypertensive chronic kidney disease with stage 1 through stage 4 chronic kidney disease, or unspecified chronic kidney disease: Secondary | ICD-10-CM | POA: Diagnosis not present

## 2022-05-19 DIAGNOSIS — Z681 Body mass index (BMI) 19 or less, adult: Secondary | ICD-10-CM | POA: Diagnosis not present

## 2022-05-19 DIAGNOSIS — J439 Emphysema, unspecified: Secondary | ICD-10-CM | POA: Diagnosis not present

## 2022-05-19 DIAGNOSIS — E8809 Other disorders of plasma-protein metabolism, not elsewhere classified: Secondary | ICD-10-CM | POA: Diagnosis not present

## 2022-05-19 DIAGNOSIS — R630 Anorexia: Secondary | ICD-10-CM | POA: Diagnosis not present

## 2022-05-19 DIAGNOSIS — R634 Abnormal weight loss: Secondary | ICD-10-CM | POA: Diagnosis not present

## 2022-05-19 DIAGNOSIS — J8410 Pulmonary fibrosis, unspecified: Secondary | ICD-10-CM | POA: Diagnosis not present

## 2022-05-19 DIAGNOSIS — Z87440 Personal history of urinary (tract) infections: Secondary | ICD-10-CM | POA: Diagnosis not present

## 2022-05-19 DIAGNOSIS — L89151 Pressure ulcer of sacral region, stage 1: Secondary | ICD-10-CM | POA: Diagnosis not present

## 2022-05-19 DIAGNOSIS — D631 Anemia in chronic kidney disease: Secondary | ICD-10-CM | POA: Diagnosis not present

## 2022-05-19 DIAGNOSIS — E039 Hypothyroidism, unspecified: Secondary | ICD-10-CM | POA: Diagnosis not present

## 2022-05-19 DIAGNOSIS — H04129 Dry eye syndrome of unspecified lacrimal gland: Secondary | ICD-10-CM | POA: Diagnosis not present

## 2022-05-19 DIAGNOSIS — H9190 Unspecified hearing loss, unspecified ear: Secondary | ICD-10-CM | POA: Diagnosis not present

## 2022-05-19 DIAGNOSIS — E43 Unspecified severe protein-calorie malnutrition: Secondary | ICD-10-CM | POA: Diagnosis not present

## 2022-05-19 DIAGNOSIS — H409 Unspecified glaucoma: Secondary | ICD-10-CM | POA: Diagnosis not present

## 2022-05-19 DIAGNOSIS — R627 Adult failure to thrive: Secondary | ICD-10-CM | POA: Diagnosis not present

## 2022-05-23 DIAGNOSIS — I739 Peripheral vascular disease, unspecified: Secondary | ICD-10-CM | POA: Diagnosis not present

## 2022-05-23 DIAGNOSIS — R630 Anorexia: Secondary | ICD-10-CM | POA: Diagnosis not present

## 2022-05-23 DIAGNOSIS — R634 Abnormal weight loss: Secondary | ICD-10-CM | POA: Diagnosis not present

## 2022-05-23 DIAGNOSIS — I129 Hypertensive chronic kidney disease with stage 1 through stage 4 chronic kidney disease, or unspecified chronic kidney disease: Secondary | ICD-10-CM | POA: Diagnosis not present

## 2022-05-23 DIAGNOSIS — N1831 Chronic kidney disease, stage 3a: Secondary | ICD-10-CM | POA: Diagnosis not present

## 2022-05-23 DIAGNOSIS — J8410 Pulmonary fibrosis, unspecified: Secondary | ICD-10-CM | POA: Diagnosis not present

## 2022-05-25 DIAGNOSIS — I739 Peripheral vascular disease, unspecified: Secondary | ICD-10-CM | POA: Diagnosis not present

## 2022-05-25 DIAGNOSIS — R634 Abnormal weight loss: Secondary | ICD-10-CM | POA: Diagnosis not present

## 2022-05-25 DIAGNOSIS — N1831 Chronic kidney disease, stage 3a: Secondary | ICD-10-CM | POA: Diagnosis not present

## 2022-05-25 DIAGNOSIS — I129 Hypertensive chronic kidney disease with stage 1 through stage 4 chronic kidney disease, or unspecified chronic kidney disease: Secondary | ICD-10-CM | POA: Diagnosis not present

## 2022-05-25 DIAGNOSIS — J8410 Pulmonary fibrosis, unspecified: Secondary | ICD-10-CM | POA: Diagnosis not present

## 2022-05-25 DIAGNOSIS — R630 Anorexia: Secondary | ICD-10-CM | POA: Diagnosis not present

## 2022-05-26 DIAGNOSIS — N1831 Chronic kidney disease, stage 3a: Secondary | ICD-10-CM | POA: Diagnosis not present

## 2022-05-26 DIAGNOSIS — I739 Peripheral vascular disease, unspecified: Secondary | ICD-10-CM | POA: Diagnosis not present

## 2022-05-26 DIAGNOSIS — R630 Anorexia: Secondary | ICD-10-CM | POA: Diagnosis not present

## 2022-05-26 DIAGNOSIS — J8410 Pulmonary fibrosis, unspecified: Secondary | ICD-10-CM | POA: Diagnosis not present

## 2022-05-26 DIAGNOSIS — R634 Abnormal weight loss: Secondary | ICD-10-CM | POA: Diagnosis not present

## 2022-05-26 DIAGNOSIS — I129 Hypertensive chronic kidney disease with stage 1 through stage 4 chronic kidney disease, or unspecified chronic kidney disease: Secondary | ICD-10-CM | POA: Diagnosis not present

## 2022-05-30 DIAGNOSIS — I739 Peripheral vascular disease, unspecified: Secondary | ICD-10-CM | POA: Diagnosis not present

## 2022-05-30 DIAGNOSIS — R630 Anorexia: Secondary | ICD-10-CM | POA: Diagnosis not present

## 2022-05-30 DIAGNOSIS — I129 Hypertensive chronic kidney disease with stage 1 through stage 4 chronic kidney disease, or unspecified chronic kidney disease: Secondary | ICD-10-CM | POA: Diagnosis not present

## 2022-05-30 DIAGNOSIS — N1831 Chronic kidney disease, stage 3a: Secondary | ICD-10-CM | POA: Diagnosis not present

## 2022-05-30 DIAGNOSIS — R634 Abnormal weight loss: Secondary | ICD-10-CM | POA: Diagnosis not present

## 2022-05-30 DIAGNOSIS — J8410 Pulmonary fibrosis, unspecified: Secondary | ICD-10-CM | POA: Diagnosis not present

## 2022-05-31 DIAGNOSIS — I739 Peripheral vascular disease, unspecified: Secondary | ICD-10-CM | POA: Diagnosis not present

## 2022-05-31 DIAGNOSIS — R630 Anorexia: Secondary | ICD-10-CM | POA: Diagnosis not present

## 2022-05-31 DIAGNOSIS — I129 Hypertensive chronic kidney disease with stage 1 through stage 4 chronic kidney disease, or unspecified chronic kidney disease: Secondary | ICD-10-CM | POA: Diagnosis not present

## 2022-05-31 DIAGNOSIS — R634 Abnormal weight loss: Secondary | ICD-10-CM | POA: Diagnosis not present

## 2022-05-31 DIAGNOSIS — N1831 Chronic kidney disease, stage 3a: Secondary | ICD-10-CM | POA: Diagnosis not present

## 2022-05-31 DIAGNOSIS — J8410 Pulmonary fibrosis, unspecified: Secondary | ICD-10-CM | POA: Diagnosis not present

## 2022-06-01 DIAGNOSIS — I739 Peripheral vascular disease, unspecified: Secondary | ICD-10-CM | POA: Diagnosis not present

## 2022-06-01 DIAGNOSIS — R634 Abnormal weight loss: Secondary | ICD-10-CM | POA: Diagnosis not present

## 2022-06-01 DIAGNOSIS — J8410 Pulmonary fibrosis, unspecified: Secondary | ICD-10-CM | POA: Diagnosis not present

## 2022-06-01 DIAGNOSIS — N1831 Chronic kidney disease, stage 3a: Secondary | ICD-10-CM | POA: Diagnosis not present

## 2022-06-01 DIAGNOSIS — R630 Anorexia: Secondary | ICD-10-CM | POA: Diagnosis not present

## 2022-06-01 DIAGNOSIS — I129 Hypertensive chronic kidney disease with stage 1 through stage 4 chronic kidney disease, or unspecified chronic kidney disease: Secondary | ICD-10-CM | POA: Diagnosis not present

## 2022-06-02 DIAGNOSIS — J8410 Pulmonary fibrosis, unspecified: Secondary | ICD-10-CM | POA: Diagnosis not present

## 2022-06-02 DIAGNOSIS — R634 Abnormal weight loss: Secondary | ICD-10-CM | POA: Diagnosis not present

## 2022-06-02 DIAGNOSIS — I129 Hypertensive chronic kidney disease with stage 1 through stage 4 chronic kidney disease, or unspecified chronic kidney disease: Secondary | ICD-10-CM | POA: Diagnosis not present

## 2022-06-02 DIAGNOSIS — N1831 Chronic kidney disease, stage 3a: Secondary | ICD-10-CM | POA: Diagnosis not present

## 2022-06-02 DIAGNOSIS — R630 Anorexia: Secondary | ICD-10-CM | POA: Diagnosis not present

## 2022-06-02 DIAGNOSIS — I739 Peripheral vascular disease, unspecified: Secondary | ICD-10-CM | POA: Diagnosis not present

## 2022-06-03 DIAGNOSIS — I739 Peripheral vascular disease, unspecified: Secondary | ICD-10-CM | POA: Diagnosis not present

## 2022-06-03 DIAGNOSIS — N1831 Chronic kidney disease, stage 3a: Secondary | ICD-10-CM | POA: Diagnosis not present

## 2022-06-03 DIAGNOSIS — R634 Abnormal weight loss: Secondary | ICD-10-CM | POA: Diagnosis not present

## 2022-06-03 DIAGNOSIS — J8410 Pulmonary fibrosis, unspecified: Secondary | ICD-10-CM | POA: Diagnosis not present

## 2022-06-03 DIAGNOSIS — I129 Hypertensive chronic kidney disease with stage 1 through stage 4 chronic kidney disease, or unspecified chronic kidney disease: Secondary | ICD-10-CM | POA: Diagnosis not present

## 2022-06-03 DIAGNOSIS — R630 Anorexia: Secondary | ICD-10-CM | POA: Diagnosis not present

## 2022-06-04 DIAGNOSIS — I739 Peripheral vascular disease, unspecified: Secondary | ICD-10-CM | POA: Diagnosis not present

## 2022-06-04 DIAGNOSIS — N1831 Chronic kidney disease, stage 3a: Secondary | ICD-10-CM | POA: Diagnosis not present

## 2022-06-04 DIAGNOSIS — J8410 Pulmonary fibrosis, unspecified: Secondary | ICD-10-CM | POA: Diagnosis not present

## 2022-06-04 DIAGNOSIS — I129 Hypertensive chronic kidney disease with stage 1 through stage 4 chronic kidney disease, or unspecified chronic kidney disease: Secondary | ICD-10-CM | POA: Diagnosis not present

## 2022-06-04 DIAGNOSIS — R634 Abnormal weight loss: Secondary | ICD-10-CM | POA: Diagnosis not present

## 2022-06-04 DIAGNOSIS — R630 Anorexia: Secondary | ICD-10-CM | POA: Diagnosis not present

## 2022-06-05 DIAGNOSIS — J8410 Pulmonary fibrosis, unspecified: Secondary | ICD-10-CM | POA: Diagnosis not present

## 2022-06-05 DIAGNOSIS — R630 Anorexia: Secondary | ICD-10-CM | POA: Diagnosis not present

## 2022-06-05 DIAGNOSIS — N1831 Chronic kidney disease, stage 3a: Secondary | ICD-10-CM | POA: Diagnosis not present

## 2022-06-05 DIAGNOSIS — R634 Abnormal weight loss: Secondary | ICD-10-CM | POA: Diagnosis not present

## 2022-06-05 DIAGNOSIS — I129 Hypertensive chronic kidney disease with stage 1 through stage 4 chronic kidney disease, or unspecified chronic kidney disease: Secondary | ICD-10-CM | POA: Diagnosis not present

## 2022-06-05 DIAGNOSIS — I739 Peripheral vascular disease, unspecified: Secondary | ICD-10-CM | POA: Diagnosis not present

## 2022-06-06 DIAGNOSIS — I739 Peripheral vascular disease, unspecified: Secondary | ICD-10-CM | POA: Diagnosis not present

## 2022-06-06 DIAGNOSIS — R630 Anorexia: Secondary | ICD-10-CM | POA: Diagnosis not present

## 2022-06-06 DIAGNOSIS — R634 Abnormal weight loss: Secondary | ICD-10-CM | POA: Diagnosis not present

## 2022-06-06 DIAGNOSIS — N1831 Chronic kidney disease, stage 3a: Secondary | ICD-10-CM | POA: Diagnosis not present

## 2022-06-06 DIAGNOSIS — J8410 Pulmonary fibrosis, unspecified: Secondary | ICD-10-CM | POA: Diagnosis not present

## 2022-06-06 DIAGNOSIS — I129 Hypertensive chronic kidney disease with stage 1 through stage 4 chronic kidney disease, or unspecified chronic kidney disease: Secondary | ICD-10-CM | POA: Diagnosis not present

## 2022-06-09 ENCOUNTER — Other Ambulatory Visit: Payer: Self-pay | Admitting: Primary Care

## 2022-06-09 DIAGNOSIS — R630 Anorexia: Secondary | ICD-10-CM | POA: Diagnosis not present

## 2022-06-09 DIAGNOSIS — I739 Peripheral vascular disease, unspecified: Secondary | ICD-10-CM | POA: Diagnosis not present

## 2022-06-09 DIAGNOSIS — I129 Hypertensive chronic kidney disease with stage 1 through stage 4 chronic kidney disease, or unspecified chronic kidney disease: Secondary | ICD-10-CM | POA: Diagnosis not present

## 2022-06-09 DIAGNOSIS — J8410 Pulmonary fibrosis, unspecified: Secondary | ICD-10-CM | POA: Diagnosis not present

## 2022-06-09 DIAGNOSIS — E039 Hypothyroidism, unspecified: Secondary | ICD-10-CM

## 2022-06-09 DIAGNOSIS — N1831 Chronic kidney disease, stage 3a: Secondary | ICD-10-CM | POA: Diagnosis not present

## 2022-06-09 DIAGNOSIS — R634 Abnormal weight loss: Secondary | ICD-10-CM | POA: Diagnosis not present

## 2022-06-13 DIAGNOSIS — N1831 Chronic kidney disease, stage 3a: Secondary | ICD-10-CM | POA: Diagnosis not present

## 2022-06-13 DIAGNOSIS — R634 Abnormal weight loss: Secondary | ICD-10-CM | POA: Diagnosis not present

## 2022-06-13 DIAGNOSIS — J8410 Pulmonary fibrosis, unspecified: Secondary | ICD-10-CM | POA: Diagnosis not present

## 2022-06-13 DIAGNOSIS — R630 Anorexia: Secondary | ICD-10-CM | POA: Diagnosis not present

## 2022-06-13 DIAGNOSIS — I739 Peripheral vascular disease, unspecified: Secondary | ICD-10-CM | POA: Diagnosis not present

## 2022-06-13 DIAGNOSIS — I129 Hypertensive chronic kidney disease with stage 1 through stage 4 chronic kidney disease, or unspecified chronic kidney disease: Secondary | ICD-10-CM | POA: Diagnosis not present

## 2022-06-14 DIAGNOSIS — J8410 Pulmonary fibrosis, unspecified: Secondary | ICD-10-CM | POA: Diagnosis not present

## 2022-06-14 DIAGNOSIS — I129 Hypertensive chronic kidney disease with stage 1 through stage 4 chronic kidney disease, or unspecified chronic kidney disease: Secondary | ICD-10-CM | POA: Diagnosis not present

## 2022-06-14 DIAGNOSIS — N1831 Chronic kidney disease, stage 3a: Secondary | ICD-10-CM | POA: Diagnosis not present

## 2022-06-14 DIAGNOSIS — R630 Anorexia: Secondary | ICD-10-CM | POA: Diagnosis not present

## 2022-06-14 DIAGNOSIS — R634 Abnormal weight loss: Secondary | ICD-10-CM | POA: Diagnosis not present

## 2022-06-14 DIAGNOSIS — I739 Peripheral vascular disease, unspecified: Secondary | ICD-10-CM | POA: Diagnosis not present

## 2022-06-16 DIAGNOSIS — I129 Hypertensive chronic kidney disease with stage 1 through stage 4 chronic kidney disease, or unspecified chronic kidney disease: Secondary | ICD-10-CM | POA: Diagnosis not present

## 2022-06-16 DIAGNOSIS — I739 Peripheral vascular disease, unspecified: Secondary | ICD-10-CM | POA: Diagnosis not present

## 2022-06-16 DIAGNOSIS — R630 Anorexia: Secondary | ICD-10-CM | POA: Diagnosis not present

## 2022-06-16 DIAGNOSIS — R634 Abnormal weight loss: Secondary | ICD-10-CM | POA: Diagnosis not present

## 2022-06-16 DIAGNOSIS — J8410 Pulmonary fibrosis, unspecified: Secondary | ICD-10-CM | POA: Diagnosis not present

## 2022-06-16 DIAGNOSIS — N1831 Chronic kidney disease, stage 3a: Secondary | ICD-10-CM | POA: Diagnosis not present

## 2022-06-19 DIAGNOSIS — H409 Unspecified glaucoma: Secondary | ICD-10-CM | POA: Diagnosis not present

## 2022-06-19 DIAGNOSIS — C4490 Unspecified malignant neoplasm of skin, unspecified: Secondary | ICD-10-CM | POA: Diagnosis not present

## 2022-06-19 DIAGNOSIS — G250 Essential tremor: Secondary | ICD-10-CM | POA: Diagnosis not present

## 2022-06-19 DIAGNOSIS — F32A Depression, unspecified: Secondary | ICD-10-CM | POA: Diagnosis not present

## 2022-06-19 DIAGNOSIS — Z681 Body mass index (BMI) 19 or less, adult: Secondary | ICD-10-CM | POA: Diagnosis not present

## 2022-06-19 DIAGNOSIS — R634 Abnormal weight loss: Secondary | ICD-10-CM | POA: Diagnosis not present

## 2022-06-19 DIAGNOSIS — H04129 Dry eye syndrome of unspecified lacrimal gland: Secondary | ICD-10-CM | POA: Diagnosis not present

## 2022-06-19 DIAGNOSIS — I129 Hypertensive chronic kidney disease with stage 1 through stage 4 chronic kidney disease, or unspecified chronic kidney disease: Secondary | ICD-10-CM | POA: Diagnosis not present

## 2022-06-19 DIAGNOSIS — E43 Unspecified severe protein-calorie malnutrition: Secondary | ICD-10-CM | POA: Diagnosis not present

## 2022-06-19 DIAGNOSIS — N1831 Chronic kidney disease, stage 3a: Secondary | ICD-10-CM | POA: Diagnosis not present

## 2022-06-19 DIAGNOSIS — R32 Unspecified urinary incontinence: Secondary | ICD-10-CM | POA: Diagnosis not present

## 2022-06-19 DIAGNOSIS — D631 Anemia in chronic kidney disease: Secondary | ICD-10-CM | POA: Diagnosis not present

## 2022-06-19 DIAGNOSIS — E8809 Other disorders of plasma-protein metabolism, not elsewhere classified: Secondary | ICD-10-CM | POA: Diagnosis not present

## 2022-06-19 DIAGNOSIS — I739 Peripheral vascular disease, unspecified: Secondary | ICD-10-CM | POA: Diagnosis not present

## 2022-06-19 DIAGNOSIS — H9190 Unspecified hearing loss, unspecified ear: Secondary | ICD-10-CM | POA: Diagnosis not present

## 2022-06-19 DIAGNOSIS — L89151 Pressure ulcer of sacral region, stage 1: Secondary | ICD-10-CM | POA: Diagnosis not present

## 2022-06-19 DIAGNOSIS — E039 Hypothyroidism, unspecified: Secondary | ICD-10-CM | POA: Diagnosis not present

## 2022-06-19 DIAGNOSIS — J439 Emphysema, unspecified: Secondary | ICD-10-CM | POA: Diagnosis not present

## 2022-06-19 DIAGNOSIS — Z87440 Personal history of urinary (tract) infections: Secondary | ICD-10-CM | POA: Diagnosis not present

## 2022-06-19 DIAGNOSIS — J8410 Pulmonary fibrosis, unspecified: Secondary | ICD-10-CM | POA: Diagnosis not present

## 2022-06-19 DIAGNOSIS — R627 Adult failure to thrive: Secondary | ICD-10-CM | POA: Diagnosis not present

## 2022-06-19 DIAGNOSIS — K449 Diaphragmatic hernia without obstruction or gangrene: Secondary | ICD-10-CM | POA: Diagnosis not present

## 2022-06-19 DIAGNOSIS — R630 Anorexia: Secondary | ICD-10-CM | POA: Diagnosis not present

## 2022-06-20 DIAGNOSIS — I129 Hypertensive chronic kidney disease with stage 1 through stage 4 chronic kidney disease, or unspecified chronic kidney disease: Secondary | ICD-10-CM | POA: Diagnosis not present

## 2022-06-20 DIAGNOSIS — I739 Peripheral vascular disease, unspecified: Secondary | ICD-10-CM | POA: Diagnosis not present

## 2022-06-20 DIAGNOSIS — R634 Abnormal weight loss: Secondary | ICD-10-CM | POA: Diagnosis not present

## 2022-06-20 DIAGNOSIS — R630 Anorexia: Secondary | ICD-10-CM | POA: Diagnosis not present

## 2022-06-20 DIAGNOSIS — N1831 Chronic kidney disease, stage 3a: Secondary | ICD-10-CM | POA: Diagnosis not present

## 2022-06-20 DIAGNOSIS — J8410 Pulmonary fibrosis, unspecified: Secondary | ICD-10-CM | POA: Diagnosis not present

## 2022-06-23 DIAGNOSIS — N1831 Chronic kidney disease, stage 3a: Secondary | ICD-10-CM | POA: Diagnosis not present

## 2022-06-23 DIAGNOSIS — I129 Hypertensive chronic kidney disease with stage 1 through stage 4 chronic kidney disease, or unspecified chronic kidney disease: Secondary | ICD-10-CM | POA: Diagnosis not present

## 2022-06-23 DIAGNOSIS — J8410 Pulmonary fibrosis, unspecified: Secondary | ICD-10-CM | POA: Diagnosis not present

## 2022-06-23 DIAGNOSIS — I739 Peripheral vascular disease, unspecified: Secondary | ICD-10-CM | POA: Diagnosis not present

## 2022-06-23 DIAGNOSIS — R634 Abnormal weight loss: Secondary | ICD-10-CM | POA: Diagnosis not present

## 2022-06-23 DIAGNOSIS — R630 Anorexia: Secondary | ICD-10-CM | POA: Diagnosis not present

## 2022-06-27 DIAGNOSIS — I739 Peripheral vascular disease, unspecified: Secondary | ICD-10-CM | POA: Diagnosis not present

## 2022-06-27 DIAGNOSIS — N1831 Chronic kidney disease, stage 3a: Secondary | ICD-10-CM | POA: Diagnosis not present

## 2022-06-27 DIAGNOSIS — R634 Abnormal weight loss: Secondary | ICD-10-CM | POA: Diagnosis not present

## 2022-06-27 DIAGNOSIS — I129 Hypertensive chronic kidney disease with stage 1 through stage 4 chronic kidney disease, or unspecified chronic kidney disease: Secondary | ICD-10-CM | POA: Diagnosis not present

## 2022-06-27 DIAGNOSIS — J8410 Pulmonary fibrosis, unspecified: Secondary | ICD-10-CM | POA: Diagnosis not present

## 2022-06-27 DIAGNOSIS — R630 Anorexia: Secondary | ICD-10-CM | POA: Diagnosis not present

## 2022-06-30 DIAGNOSIS — R630 Anorexia: Secondary | ICD-10-CM | POA: Diagnosis not present

## 2022-06-30 DIAGNOSIS — N1831 Chronic kidney disease, stage 3a: Secondary | ICD-10-CM | POA: Diagnosis not present

## 2022-06-30 DIAGNOSIS — J8410 Pulmonary fibrosis, unspecified: Secondary | ICD-10-CM | POA: Diagnosis not present

## 2022-06-30 DIAGNOSIS — I739 Peripheral vascular disease, unspecified: Secondary | ICD-10-CM | POA: Diagnosis not present

## 2022-06-30 DIAGNOSIS — I129 Hypertensive chronic kidney disease with stage 1 through stage 4 chronic kidney disease, or unspecified chronic kidney disease: Secondary | ICD-10-CM | POA: Diagnosis not present

## 2022-06-30 DIAGNOSIS — R634 Abnormal weight loss: Secondary | ICD-10-CM | POA: Diagnosis not present

## 2022-07-04 DIAGNOSIS — I739 Peripheral vascular disease, unspecified: Secondary | ICD-10-CM | POA: Diagnosis not present

## 2022-07-04 DIAGNOSIS — R634 Abnormal weight loss: Secondary | ICD-10-CM | POA: Diagnosis not present

## 2022-07-04 DIAGNOSIS — N1831 Chronic kidney disease, stage 3a: Secondary | ICD-10-CM | POA: Diagnosis not present

## 2022-07-04 DIAGNOSIS — I129 Hypertensive chronic kidney disease with stage 1 through stage 4 chronic kidney disease, or unspecified chronic kidney disease: Secondary | ICD-10-CM | POA: Diagnosis not present

## 2022-07-04 DIAGNOSIS — R630 Anorexia: Secondary | ICD-10-CM | POA: Diagnosis not present

## 2022-07-04 DIAGNOSIS — J8410 Pulmonary fibrosis, unspecified: Secondary | ICD-10-CM | POA: Diagnosis not present

## 2022-07-06 DIAGNOSIS — N1831 Chronic kidney disease, stage 3a: Secondary | ICD-10-CM | POA: Diagnosis not present

## 2022-07-06 DIAGNOSIS — I129 Hypertensive chronic kidney disease with stage 1 through stage 4 chronic kidney disease, or unspecified chronic kidney disease: Secondary | ICD-10-CM | POA: Diagnosis not present

## 2022-07-06 DIAGNOSIS — I739 Peripheral vascular disease, unspecified: Secondary | ICD-10-CM | POA: Diagnosis not present

## 2022-07-06 DIAGNOSIS — R630 Anorexia: Secondary | ICD-10-CM | POA: Diagnosis not present

## 2022-07-06 DIAGNOSIS — J8410 Pulmonary fibrosis, unspecified: Secondary | ICD-10-CM | POA: Diagnosis not present

## 2022-07-06 DIAGNOSIS — R634 Abnormal weight loss: Secondary | ICD-10-CM | POA: Diagnosis not present

## 2022-07-07 DIAGNOSIS — J8410 Pulmonary fibrosis, unspecified: Secondary | ICD-10-CM | POA: Diagnosis not present

## 2022-07-07 DIAGNOSIS — R634 Abnormal weight loss: Secondary | ICD-10-CM | POA: Diagnosis not present

## 2022-07-07 DIAGNOSIS — I739 Peripheral vascular disease, unspecified: Secondary | ICD-10-CM | POA: Diagnosis not present

## 2022-07-07 DIAGNOSIS — R630 Anorexia: Secondary | ICD-10-CM | POA: Diagnosis not present

## 2022-07-07 DIAGNOSIS — I129 Hypertensive chronic kidney disease with stage 1 through stage 4 chronic kidney disease, or unspecified chronic kidney disease: Secondary | ICD-10-CM | POA: Diagnosis not present

## 2022-07-07 DIAGNOSIS — N1831 Chronic kidney disease, stage 3a: Secondary | ICD-10-CM | POA: Diagnosis not present

## 2022-07-10 DIAGNOSIS — R634 Abnormal weight loss: Secondary | ICD-10-CM | POA: Diagnosis not present

## 2022-07-10 DIAGNOSIS — J8410 Pulmonary fibrosis, unspecified: Secondary | ICD-10-CM | POA: Diagnosis not present

## 2022-07-10 DIAGNOSIS — R630 Anorexia: Secondary | ICD-10-CM | POA: Diagnosis not present

## 2022-07-10 DIAGNOSIS — N1831 Chronic kidney disease, stage 3a: Secondary | ICD-10-CM | POA: Diagnosis not present

## 2022-07-10 DIAGNOSIS — I129 Hypertensive chronic kidney disease with stage 1 through stage 4 chronic kidney disease, or unspecified chronic kidney disease: Secondary | ICD-10-CM | POA: Diagnosis not present

## 2022-07-10 DIAGNOSIS — I739 Peripheral vascular disease, unspecified: Secondary | ICD-10-CM | POA: Diagnosis not present

## 2022-07-11 DIAGNOSIS — R630 Anorexia: Secondary | ICD-10-CM | POA: Diagnosis not present

## 2022-07-11 DIAGNOSIS — N1831 Chronic kidney disease, stage 3a: Secondary | ICD-10-CM | POA: Diagnosis not present

## 2022-07-11 DIAGNOSIS — I129 Hypertensive chronic kidney disease with stage 1 through stage 4 chronic kidney disease, or unspecified chronic kidney disease: Secondary | ICD-10-CM | POA: Diagnosis not present

## 2022-07-11 DIAGNOSIS — I739 Peripheral vascular disease, unspecified: Secondary | ICD-10-CM | POA: Diagnosis not present

## 2022-07-11 DIAGNOSIS — R634 Abnormal weight loss: Secondary | ICD-10-CM | POA: Diagnosis not present

## 2022-07-11 DIAGNOSIS — J8410 Pulmonary fibrosis, unspecified: Secondary | ICD-10-CM | POA: Diagnosis not present

## 2022-07-12 DIAGNOSIS — I739 Peripheral vascular disease, unspecified: Secondary | ICD-10-CM | POA: Diagnosis not present

## 2022-07-12 DIAGNOSIS — J8410 Pulmonary fibrosis, unspecified: Secondary | ICD-10-CM | POA: Diagnosis not present

## 2022-07-12 DIAGNOSIS — I129 Hypertensive chronic kidney disease with stage 1 through stage 4 chronic kidney disease, or unspecified chronic kidney disease: Secondary | ICD-10-CM | POA: Diagnosis not present

## 2022-07-12 DIAGNOSIS — N1831 Chronic kidney disease, stage 3a: Secondary | ICD-10-CM | POA: Diagnosis not present

## 2022-07-12 DIAGNOSIS — R630 Anorexia: Secondary | ICD-10-CM | POA: Diagnosis not present

## 2022-07-12 DIAGNOSIS — R634 Abnormal weight loss: Secondary | ICD-10-CM | POA: Diagnosis not present

## 2022-07-14 DIAGNOSIS — I739 Peripheral vascular disease, unspecified: Secondary | ICD-10-CM | POA: Diagnosis not present

## 2022-07-14 DIAGNOSIS — R634 Abnormal weight loss: Secondary | ICD-10-CM | POA: Diagnosis not present

## 2022-07-14 DIAGNOSIS — J8410 Pulmonary fibrosis, unspecified: Secondary | ICD-10-CM | POA: Diagnosis not present

## 2022-07-14 DIAGNOSIS — N1831 Chronic kidney disease, stage 3a: Secondary | ICD-10-CM | POA: Diagnosis not present

## 2022-07-14 DIAGNOSIS — I129 Hypertensive chronic kidney disease with stage 1 through stage 4 chronic kidney disease, or unspecified chronic kidney disease: Secondary | ICD-10-CM | POA: Diagnosis not present

## 2022-07-14 DIAGNOSIS — R630 Anorexia: Secondary | ICD-10-CM | POA: Diagnosis not present

## 2022-07-17 DIAGNOSIS — N1831 Chronic kidney disease, stage 3a: Secondary | ICD-10-CM | POA: Diagnosis not present

## 2022-07-17 DIAGNOSIS — R634 Abnormal weight loss: Secondary | ICD-10-CM | POA: Diagnosis not present

## 2022-07-17 DIAGNOSIS — I739 Peripheral vascular disease, unspecified: Secondary | ICD-10-CM | POA: Diagnosis not present

## 2022-07-17 DIAGNOSIS — I129 Hypertensive chronic kidney disease with stage 1 through stage 4 chronic kidney disease, or unspecified chronic kidney disease: Secondary | ICD-10-CM | POA: Diagnosis not present

## 2022-07-17 DIAGNOSIS — R630 Anorexia: Secondary | ICD-10-CM | POA: Diagnosis not present

## 2022-07-17 DIAGNOSIS — J8410 Pulmonary fibrosis, unspecified: Secondary | ICD-10-CM | POA: Diagnosis not present

## 2022-07-18 DIAGNOSIS — J8410 Pulmonary fibrosis, unspecified: Secondary | ICD-10-CM | POA: Diagnosis not present

## 2022-07-18 DIAGNOSIS — I129 Hypertensive chronic kidney disease with stage 1 through stage 4 chronic kidney disease, or unspecified chronic kidney disease: Secondary | ICD-10-CM | POA: Diagnosis not present

## 2022-07-18 DIAGNOSIS — R634 Abnormal weight loss: Secondary | ICD-10-CM | POA: Diagnosis not present

## 2022-07-18 DIAGNOSIS — R630 Anorexia: Secondary | ICD-10-CM | POA: Diagnosis not present

## 2022-07-18 DIAGNOSIS — I739 Peripheral vascular disease, unspecified: Secondary | ICD-10-CM | POA: Diagnosis not present

## 2022-07-18 DIAGNOSIS — N1831 Chronic kidney disease, stage 3a: Secondary | ICD-10-CM | POA: Diagnosis not present

## 2022-07-20 DIAGNOSIS — E8809 Other disorders of plasma-protein metabolism, not elsewhere classified: Secondary | ICD-10-CM | POA: Diagnosis not present

## 2022-07-20 DIAGNOSIS — G250 Essential tremor: Secondary | ICD-10-CM | POA: Diagnosis not present

## 2022-07-20 DIAGNOSIS — R634 Abnormal weight loss: Secondary | ICD-10-CM | POA: Diagnosis not present

## 2022-07-20 DIAGNOSIS — J439 Emphysema, unspecified: Secondary | ICD-10-CM | POA: Diagnosis not present

## 2022-07-20 DIAGNOSIS — E43 Unspecified severe protein-calorie malnutrition: Secondary | ICD-10-CM | POA: Diagnosis not present

## 2022-07-20 DIAGNOSIS — H409 Unspecified glaucoma: Secondary | ICD-10-CM | POA: Diagnosis not present

## 2022-07-20 DIAGNOSIS — I739 Peripheral vascular disease, unspecified: Secondary | ICD-10-CM | POA: Diagnosis not present

## 2022-07-20 DIAGNOSIS — R32 Unspecified urinary incontinence: Secondary | ICD-10-CM | POA: Diagnosis not present

## 2022-07-20 DIAGNOSIS — I129 Hypertensive chronic kidney disease with stage 1 through stage 4 chronic kidney disease, or unspecified chronic kidney disease: Secondary | ICD-10-CM | POA: Diagnosis not present

## 2022-07-20 DIAGNOSIS — H9190 Unspecified hearing loss, unspecified ear: Secondary | ICD-10-CM | POA: Diagnosis not present

## 2022-07-20 DIAGNOSIS — E039 Hypothyroidism, unspecified: Secondary | ICD-10-CM | POA: Diagnosis not present

## 2022-07-20 DIAGNOSIS — Z87440 Personal history of urinary (tract) infections: Secondary | ICD-10-CM | POA: Diagnosis not present

## 2022-07-20 DIAGNOSIS — F32A Depression, unspecified: Secondary | ICD-10-CM | POA: Diagnosis not present

## 2022-07-20 DIAGNOSIS — N1831 Chronic kidney disease, stage 3a: Secondary | ICD-10-CM | POA: Diagnosis not present

## 2022-07-20 DIAGNOSIS — R630 Anorexia: Secondary | ICD-10-CM | POA: Diagnosis not present

## 2022-07-20 DIAGNOSIS — D631 Anemia in chronic kidney disease: Secondary | ICD-10-CM | POA: Diagnosis not present

## 2022-07-20 DIAGNOSIS — R627 Adult failure to thrive: Secondary | ICD-10-CM | POA: Diagnosis not present

## 2022-07-20 DIAGNOSIS — K449 Diaphragmatic hernia without obstruction or gangrene: Secondary | ICD-10-CM | POA: Diagnosis not present

## 2022-07-20 DIAGNOSIS — C4490 Unspecified malignant neoplasm of skin, unspecified: Secondary | ICD-10-CM | POA: Diagnosis not present

## 2022-07-20 DIAGNOSIS — L89151 Pressure ulcer of sacral region, stage 1: Secondary | ICD-10-CM | POA: Diagnosis not present

## 2022-07-20 DIAGNOSIS — J8410 Pulmonary fibrosis, unspecified: Secondary | ICD-10-CM | POA: Diagnosis not present

## 2022-07-20 DIAGNOSIS — Z681 Body mass index (BMI) 19 or less, adult: Secondary | ICD-10-CM | POA: Diagnosis not present

## 2022-07-20 DIAGNOSIS — H04129 Dry eye syndrome of unspecified lacrimal gland: Secondary | ICD-10-CM | POA: Diagnosis not present

## 2022-07-21 DIAGNOSIS — I739 Peripheral vascular disease, unspecified: Secondary | ICD-10-CM | POA: Diagnosis not present

## 2022-07-21 DIAGNOSIS — I129 Hypertensive chronic kidney disease with stage 1 through stage 4 chronic kidney disease, or unspecified chronic kidney disease: Secondary | ICD-10-CM | POA: Diagnosis not present

## 2022-07-21 DIAGNOSIS — J8410 Pulmonary fibrosis, unspecified: Secondary | ICD-10-CM | POA: Diagnosis not present

## 2022-07-21 DIAGNOSIS — R634 Abnormal weight loss: Secondary | ICD-10-CM | POA: Diagnosis not present

## 2022-07-21 DIAGNOSIS — R630 Anorexia: Secondary | ICD-10-CM | POA: Diagnosis not present

## 2022-07-21 DIAGNOSIS — N1831 Chronic kidney disease, stage 3a: Secondary | ICD-10-CM | POA: Diagnosis not present

## 2022-07-22 DIAGNOSIS — I129 Hypertensive chronic kidney disease with stage 1 through stage 4 chronic kidney disease, or unspecified chronic kidney disease: Secondary | ICD-10-CM | POA: Diagnosis not present

## 2022-07-22 DIAGNOSIS — J8410 Pulmonary fibrosis, unspecified: Secondary | ICD-10-CM | POA: Diagnosis not present

## 2022-07-22 DIAGNOSIS — R630 Anorexia: Secondary | ICD-10-CM | POA: Diagnosis not present

## 2022-07-22 DIAGNOSIS — R634 Abnormal weight loss: Secondary | ICD-10-CM | POA: Diagnosis not present

## 2022-07-22 DIAGNOSIS — I739 Peripheral vascular disease, unspecified: Secondary | ICD-10-CM | POA: Diagnosis not present

## 2022-07-22 DIAGNOSIS — N1831 Chronic kidney disease, stage 3a: Secondary | ICD-10-CM | POA: Diagnosis not present

## 2022-07-23 DIAGNOSIS — J8410 Pulmonary fibrosis, unspecified: Secondary | ICD-10-CM | POA: Diagnosis not present

## 2022-07-23 DIAGNOSIS — R634 Abnormal weight loss: Secondary | ICD-10-CM | POA: Diagnosis not present

## 2022-07-23 DIAGNOSIS — I739 Peripheral vascular disease, unspecified: Secondary | ICD-10-CM | POA: Diagnosis not present

## 2022-07-23 DIAGNOSIS — N1831 Chronic kidney disease, stage 3a: Secondary | ICD-10-CM | POA: Diagnosis not present

## 2022-07-23 DIAGNOSIS — R630 Anorexia: Secondary | ICD-10-CM | POA: Diagnosis not present

## 2022-07-23 DIAGNOSIS — I129 Hypertensive chronic kidney disease with stage 1 through stage 4 chronic kidney disease, or unspecified chronic kidney disease: Secondary | ICD-10-CM | POA: Diagnosis not present

## 2022-07-24 DIAGNOSIS — I739 Peripheral vascular disease, unspecified: Secondary | ICD-10-CM | POA: Diagnosis not present

## 2022-07-24 DIAGNOSIS — N1831 Chronic kidney disease, stage 3a: Secondary | ICD-10-CM | POA: Diagnosis not present

## 2022-07-24 DIAGNOSIS — R630 Anorexia: Secondary | ICD-10-CM | POA: Diagnosis not present

## 2022-07-24 DIAGNOSIS — J8410 Pulmonary fibrosis, unspecified: Secondary | ICD-10-CM | POA: Diagnosis not present

## 2022-07-24 DIAGNOSIS — R634 Abnormal weight loss: Secondary | ICD-10-CM | POA: Diagnosis not present

## 2022-07-24 DIAGNOSIS — I129 Hypertensive chronic kidney disease with stage 1 through stage 4 chronic kidney disease, or unspecified chronic kidney disease: Secondary | ICD-10-CM | POA: Diagnosis not present

## 2022-07-25 DIAGNOSIS — R634 Abnormal weight loss: Secondary | ICD-10-CM | POA: Diagnosis not present

## 2022-07-25 DIAGNOSIS — R630 Anorexia: Secondary | ICD-10-CM | POA: Diagnosis not present

## 2022-07-25 DIAGNOSIS — N1831 Chronic kidney disease, stage 3a: Secondary | ICD-10-CM | POA: Diagnosis not present

## 2022-07-25 DIAGNOSIS — J8410 Pulmonary fibrosis, unspecified: Secondary | ICD-10-CM | POA: Diagnosis not present

## 2022-07-25 DIAGNOSIS — I739 Peripheral vascular disease, unspecified: Secondary | ICD-10-CM | POA: Diagnosis not present

## 2022-07-25 DIAGNOSIS — I129 Hypertensive chronic kidney disease with stage 1 through stage 4 chronic kidney disease, or unspecified chronic kidney disease: Secondary | ICD-10-CM | POA: Diagnosis not present

## 2022-07-26 DIAGNOSIS — I739 Peripheral vascular disease, unspecified: Secondary | ICD-10-CM | POA: Diagnosis not present

## 2022-07-26 DIAGNOSIS — J8410 Pulmonary fibrosis, unspecified: Secondary | ICD-10-CM | POA: Diagnosis not present

## 2022-07-26 DIAGNOSIS — R634 Abnormal weight loss: Secondary | ICD-10-CM | POA: Diagnosis not present

## 2022-07-26 DIAGNOSIS — N1831 Chronic kidney disease, stage 3a: Secondary | ICD-10-CM | POA: Diagnosis not present

## 2022-07-26 DIAGNOSIS — I129 Hypertensive chronic kidney disease with stage 1 through stage 4 chronic kidney disease, or unspecified chronic kidney disease: Secondary | ICD-10-CM | POA: Diagnosis not present

## 2022-07-26 DIAGNOSIS — R630 Anorexia: Secondary | ICD-10-CM | POA: Diagnosis not present

## 2022-07-27 DIAGNOSIS — N1831 Chronic kidney disease, stage 3a: Secondary | ICD-10-CM | POA: Diagnosis not present

## 2022-07-27 DIAGNOSIS — R630 Anorexia: Secondary | ICD-10-CM | POA: Diagnosis not present

## 2022-07-27 DIAGNOSIS — R634 Abnormal weight loss: Secondary | ICD-10-CM | POA: Diagnosis not present

## 2022-07-27 DIAGNOSIS — J8410 Pulmonary fibrosis, unspecified: Secondary | ICD-10-CM | POA: Diagnosis not present

## 2022-07-27 DIAGNOSIS — I739 Peripheral vascular disease, unspecified: Secondary | ICD-10-CM | POA: Diagnosis not present

## 2022-07-27 DIAGNOSIS — I129 Hypertensive chronic kidney disease with stage 1 through stage 4 chronic kidney disease, or unspecified chronic kidney disease: Secondary | ICD-10-CM | POA: Diagnosis not present

## 2022-07-28 DIAGNOSIS — R634 Abnormal weight loss: Secondary | ICD-10-CM | POA: Diagnosis not present

## 2022-07-28 DIAGNOSIS — J8410 Pulmonary fibrosis, unspecified: Secondary | ICD-10-CM | POA: Diagnosis not present

## 2022-07-28 DIAGNOSIS — R630 Anorexia: Secondary | ICD-10-CM | POA: Diagnosis not present

## 2022-07-28 DIAGNOSIS — I129 Hypertensive chronic kidney disease with stage 1 through stage 4 chronic kidney disease, or unspecified chronic kidney disease: Secondary | ICD-10-CM | POA: Diagnosis not present

## 2022-07-28 DIAGNOSIS — I739 Peripheral vascular disease, unspecified: Secondary | ICD-10-CM | POA: Diagnosis not present

## 2022-07-28 DIAGNOSIS — N1831 Chronic kidney disease, stage 3a: Secondary | ICD-10-CM | POA: Diagnosis not present

## 2022-08-01 DIAGNOSIS — R634 Abnormal weight loss: Secondary | ICD-10-CM | POA: Diagnosis not present

## 2022-08-01 DIAGNOSIS — N1831 Chronic kidney disease, stage 3a: Secondary | ICD-10-CM | POA: Diagnosis not present

## 2022-08-01 DIAGNOSIS — R630 Anorexia: Secondary | ICD-10-CM | POA: Diagnosis not present

## 2022-08-01 DIAGNOSIS — J8410 Pulmonary fibrosis, unspecified: Secondary | ICD-10-CM | POA: Diagnosis not present

## 2022-08-01 DIAGNOSIS — I129 Hypertensive chronic kidney disease with stage 1 through stage 4 chronic kidney disease, or unspecified chronic kidney disease: Secondary | ICD-10-CM | POA: Diagnosis not present

## 2022-08-01 DIAGNOSIS — I739 Peripheral vascular disease, unspecified: Secondary | ICD-10-CM | POA: Diagnosis not present

## 2022-08-02 DIAGNOSIS — R630 Anorexia: Secondary | ICD-10-CM | POA: Diagnosis not present

## 2022-08-02 DIAGNOSIS — R634 Abnormal weight loss: Secondary | ICD-10-CM | POA: Diagnosis not present

## 2022-08-02 DIAGNOSIS — J8410 Pulmonary fibrosis, unspecified: Secondary | ICD-10-CM | POA: Diagnosis not present

## 2022-08-02 DIAGNOSIS — I739 Peripheral vascular disease, unspecified: Secondary | ICD-10-CM | POA: Diagnosis not present

## 2022-08-02 DIAGNOSIS — N1831 Chronic kidney disease, stage 3a: Secondary | ICD-10-CM | POA: Diagnosis not present

## 2022-08-02 DIAGNOSIS — I129 Hypertensive chronic kidney disease with stage 1 through stage 4 chronic kidney disease, or unspecified chronic kidney disease: Secondary | ICD-10-CM | POA: Diagnosis not present

## 2022-08-04 DIAGNOSIS — N1831 Chronic kidney disease, stage 3a: Secondary | ICD-10-CM | POA: Diagnosis not present

## 2022-08-04 DIAGNOSIS — I739 Peripheral vascular disease, unspecified: Secondary | ICD-10-CM | POA: Diagnosis not present

## 2022-08-04 DIAGNOSIS — R634 Abnormal weight loss: Secondary | ICD-10-CM | POA: Diagnosis not present

## 2022-08-04 DIAGNOSIS — I129 Hypertensive chronic kidney disease with stage 1 through stage 4 chronic kidney disease, or unspecified chronic kidney disease: Secondary | ICD-10-CM | POA: Diagnosis not present

## 2022-08-04 DIAGNOSIS — R630 Anorexia: Secondary | ICD-10-CM | POA: Diagnosis not present

## 2022-08-04 DIAGNOSIS — J8410 Pulmonary fibrosis, unspecified: Secondary | ICD-10-CM | POA: Diagnosis not present

## 2022-08-08 DIAGNOSIS — J8410 Pulmonary fibrosis, unspecified: Secondary | ICD-10-CM | POA: Diagnosis not present

## 2022-08-08 DIAGNOSIS — I129 Hypertensive chronic kidney disease with stage 1 through stage 4 chronic kidney disease, or unspecified chronic kidney disease: Secondary | ICD-10-CM | POA: Diagnosis not present

## 2022-08-08 DIAGNOSIS — N1831 Chronic kidney disease, stage 3a: Secondary | ICD-10-CM | POA: Diagnosis not present

## 2022-08-08 DIAGNOSIS — R630 Anorexia: Secondary | ICD-10-CM | POA: Diagnosis not present

## 2022-08-08 DIAGNOSIS — R634 Abnormal weight loss: Secondary | ICD-10-CM | POA: Diagnosis not present

## 2022-08-08 DIAGNOSIS — I739 Peripheral vascular disease, unspecified: Secondary | ICD-10-CM | POA: Diagnosis not present

## 2022-08-11 DIAGNOSIS — J8410 Pulmonary fibrosis, unspecified: Secondary | ICD-10-CM | POA: Diagnosis not present

## 2022-08-11 DIAGNOSIS — I739 Peripheral vascular disease, unspecified: Secondary | ICD-10-CM | POA: Diagnosis not present

## 2022-08-11 DIAGNOSIS — R630 Anorexia: Secondary | ICD-10-CM | POA: Diagnosis not present

## 2022-08-11 DIAGNOSIS — R634 Abnormal weight loss: Secondary | ICD-10-CM | POA: Diagnosis not present

## 2022-08-11 DIAGNOSIS — I129 Hypertensive chronic kidney disease with stage 1 through stage 4 chronic kidney disease, or unspecified chronic kidney disease: Secondary | ICD-10-CM | POA: Diagnosis not present

## 2022-08-11 DIAGNOSIS — N1831 Chronic kidney disease, stage 3a: Secondary | ICD-10-CM | POA: Diagnosis not present

## 2022-08-15 DIAGNOSIS — R634 Abnormal weight loss: Secondary | ICD-10-CM | POA: Diagnosis not present

## 2022-08-15 DIAGNOSIS — R630 Anorexia: Secondary | ICD-10-CM | POA: Diagnosis not present

## 2022-08-15 DIAGNOSIS — I129 Hypertensive chronic kidney disease with stage 1 through stage 4 chronic kidney disease, or unspecified chronic kidney disease: Secondary | ICD-10-CM | POA: Diagnosis not present

## 2022-08-15 DIAGNOSIS — J8410 Pulmonary fibrosis, unspecified: Secondary | ICD-10-CM | POA: Diagnosis not present

## 2022-08-15 DIAGNOSIS — N1831 Chronic kidney disease, stage 3a: Secondary | ICD-10-CM | POA: Diagnosis not present

## 2022-08-15 DIAGNOSIS — I739 Peripheral vascular disease, unspecified: Secondary | ICD-10-CM | POA: Diagnosis not present

## 2022-08-16 DIAGNOSIS — R630 Anorexia: Secondary | ICD-10-CM | POA: Diagnosis not present

## 2022-08-16 DIAGNOSIS — I129 Hypertensive chronic kidney disease with stage 1 through stage 4 chronic kidney disease, or unspecified chronic kidney disease: Secondary | ICD-10-CM | POA: Diagnosis not present

## 2022-08-16 DIAGNOSIS — N1831 Chronic kidney disease, stage 3a: Secondary | ICD-10-CM | POA: Diagnosis not present

## 2022-08-16 DIAGNOSIS — I739 Peripheral vascular disease, unspecified: Secondary | ICD-10-CM | POA: Diagnosis not present

## 2022-08-16 DIAGNOSIS — J8410 Pulmonary fibrosis, unspecified: Secondary | ICD-10-CM | POA: Diagnosis not present

## 2022-08-16 DIAGNOSIS — R634 Abnormal weight loss: Secondary | ICD-10-CM | POA: Diagnosis not present

## 2022-08-18 DIAGNOSIS — R634 Abnormal weight loss: Secondary | ICD-10-CM | POA: Diagnosis not present

## 2022-08-18 DIAGNOSIS — D631 Anemia in chronic kidney disease: Secondary | ICD-10-CM | POA: Diagnosis not present

## 2022-08-18 DIAGNOSIS — E039 Hypothyroidism, unspecified: Secondary | ICD-10-CM | POA: Diagnosis not present

## 2022-08-18 DIAGNOSIS — L89151 Pressure ulcer of sacral region, stage 1: Secondary | ICD-10-CM | POA: Diagnosis not present

## 2022-08-18 DIAGNOSIS — E43 Unspecified severe protein-calorie malnutrition: Secondary | ICD-10-CM | POA: Diagnosis not present

## 2022-08-18 DIAGNOSIS — N1831 Chronic kidney disease, stage 3a: Secondary | ICD-10-CM | POA: Diagnosis not present

## 2022-08-18 DIAGNOSIS — C4490 Unspecified malignant neoplasm of skin, unspecified: Secondary | ICD-10-CM | POA: Diagnosis not present

## 2022-08-18 DIAGNOSIS — Z87440 Personal history of urinary (tract) infections: Secondary | ICD-10-CM | POA: Diagnosis not present

## 2022-08-18 DIAGNOSIS — I739 Peripheral vascular disease, unspecified: Secondary | ICD-10-CM | POA: Diagnosis not present

## 2022-08-18 DIAGNOSIS — J8410 Pulmonary fibrosis, unspecified: Secondary | ICD-10-CM | POA: Diagnosis not present

## 2022-08-18 DIAGNOSIS — R627 Adult failure to thrive: Secondary | ICD-10-CM | POA: Diagnosis not present

## 2022-08-18 DIAGNOSIS — K449 Diaphragmatic hernia without obstruction or gangrene: Secondary | ICD-10-CM | POA: Diagnosis not present

## 2022-08-18 DIAGNOSIS — H9190 Unspecified hearing loss, unspecified ear: Secondary | ICD-10-CM | POA: Diagnosis not present

## 2022-08-18 DIAGNOSIS — Z681 Body mass index (BMI) 19 or less, adult: Secondary | ICD-10-CM | POA: Diagnosis not present

## 2022-08-18 DIAGNOSIS — G250 Essential tremor: Secondary | ICD-10-CM | POA: Diagnosis not present

## 2022-08-18 DIAGNOSIS — Z741 Need for assistance with personal care: Secondary | ICD-10-CM | POA: Diagnosis not present

## 2022-08-18 DIAGNOSIS — F32A Depression, unspecified: Secondary | ICD-10-CM | POA: Diagnosis not present

## 2022-08-18 DIAGNOSIS — J439 Emphysema, unspecified: Secondary | ICD-10-CM | POA: Diagnosis not present

## 2022-08-18 DIAGNOSIS — R630 Anorexia: Secondary | ICD-10-CM | POA: Diagnosis not present

## 2022-08-18 DIAGNOSIS — E8809 Other disorders of plasma-protein metabolism, not elsewhere classified: Secondary | ICD-10-CM | POA: Diagnosis not present

## 2022-08-18 DIAGNOSIS — H04129 Dry eye syndrome of unspecified lacrimal gland: Secondary | ICD-10-CM | POA: Diagnosis not present

## 2022-08-18 DIAGNOSIS — R32 Unspecified urinary incontinence: Secondary | ICD-10-CM | POA: Diagnosis not present

## 2022-08-18 DIAGNOSIS — I129 Hypertensive chronic kidney disease with stage 1 through stage 4 chronic kidney disease, or unspecified chronic kidney disease: Secondary | ICD-10-CM | POA: Diagnosis not present

## 2022-08-18 DIAGNOSIS — H409 Unspecified glaucoma: Secondary | ICD-10-CM | POA: Diagnosis not present

## 2022-08-22 DIAGNOSIS — I739 Peripheral vascular disease, unspecified: Secondary | ICD-10-CM | POA: Diagnosis not present

## 2022-08-22 DIAGNOSIS — R634 Abnormal weight loss: Secondary | ICD-10-CM | POA: Diagnosis not present

## 2022-08-22 DIAGNOSIS — J8410 Pulmonary fibrosis, unspecified: Secondary | ICD-10-CM | POA: Diagnosis not present

## 2022-08-22 DIAGNOSIS — I129 Hypertensive chronic kidney disease with stage 1 through stage 4 chronic kidney disease, or unspecified chronic kidney disease: Secondary | ICD-10-CM | POA: Diagnosis not present

## 2022-08-22 DIAGNOSIS — N1831 Chronic kidney disease, stage 3a: Secondary | ICD-10-CM | POA: Diagnosis not present

## 2022-08-22 DIAGNOSIS — R630 Anorexia: Secondary | ICD-10-CM | POA: Diagnosis not present

## 2022-08-23 ENCOUNTER — Emergency Department (HOSPITAL_COMMUNITY)

## 2022-08-23 ENCOUNTER — Other Ambulatory Visit: Payer: Self-pay

## 2022-08-23 ENCOUNTER — Emergency Department (HOSPITAL_COMMUNITY)
Admission: EM | Admit: 2022-08-23 | Discharge: 2022-08-24 | Disposition: A | Discharge status: Home / Self Care | Attending: Emergency Medicine | Admitting: Emergency Medicine

## 2022-08-23 DIAGNOSIS — F0390 Unspecified dementia without behavioral disturbance: Secondary | ICD-10-CM | POA: Insufficient documentation

## 2022-08-23 DIAGNOSIS — W010XXA Fall on same level from slipping, tripping and stumbling without subsequent striking against object, initial encounter: Secondary | ICD-10-CM | POA: Diagnosis not present

## 2022-08-23 DIAGNOSIS — N3000 Acute cystitis without hematuria: Secondary | ICD-10-CM | POA: Diagnosis not present

## 2022-08-23 DIAGNOSIS — G4489 Other headache syndrome: Secondary | ICD-10-CM | POA: Diagnosis not present

## 2022-08-23 DIAGNOSIS — E039 Hypothyroidism, unspecified: Secondary | ICD-10-CM | POA: Insufficient documentation

## 2022-08-23 DIAGNOSIS — N1831 Chronic kidney disease, stage 3a: Secondary | ICD-10-CM | POA: Diagnosis not present

## 2022-08-23 DIAGNOSIS — Z79899 Other long term (current) drug therapy: Secondary | ICD-10-CM | POA: Diagnosis not present

## 2022-08-23 DIAGNOSIS — S060XAA Concussion with loss of consciousness status unknown, initial encounter: Secondary | ICD-10-CM | POA: Insufficient documentation

## 2022-08-23 DIAGNOSIS — I10 Essential (primary) hypertension: Secondary | ICD-10-CM | POA: Diagnosis not present

## 2022-08-23 DIAGNOSIS — J8410 Pulmonary fibrosis, unspecified: Secondary | ICD-10-CM | POA: Diagnosis not present

## 2022-08-23 DIAGNOSIS — S40011A Contusion of right shoulder, initial encounter: Secondary | ICD-10-CM | POA: Insufficient documentation

## 2022-08-23 DIAGNOSIS — R634 Abnormal weight loss: Secondary | ICD-10-CM | POA: Diagnosis not present

## 2022-08-23 DIAGNOSIS — W19XXXA Unspecified fall, initial encounter: Secondary | ICD-10-CM

## 2022-08-23 DIAGNOSIS — S0990XA Unspecified injury of head, initial encounter: Secondary | ICD-10-CM | POA: Diagnosis not present

## 2022-08-23 DIAGNOSIS — I739 Peripheral vascular disease, unspecified: Secondary | ICD-10-CM | POA: Diagnosis not present

## 2022-08-23 DIAGNOSIS — I129 Hypertensive chronic kidney disease with stage 1 through stage 4 chronic kidney disease, or unspecified chronic kidney disease: Secondary | ICD-10-CM | POA: Diagnosis not present

## 2022-08-23 DIAGNOSIS — R630 Anorexia: Secondary | ICD-10-CM | POA: Diagnosis not present

## 2022-08-23 DIAGNOSIS — M19011 Primary osteoarthritis, right shoulder: Secondary | ICD-10-CM | POA: Diagnosis not present

## 2022-08-23 DIAGNOSIS — G9389 Other specified disorders of brain: Secondary | ICD-10-CM | POA: Diagnosis not present

## 2022-08-23 NOTE — ED Triage Notes (Signed)
Patient arrived with EMS from home found by family on the floor at bathroom this evening , lost her balance and fell landed on her right side / hit her head against the floor . No LOC. Baseline demented . She is not taking anticoagulant . CBG= 112.

## 2022-08-24 ENCOUNTER — Encounter (HOSPITAL_COMMUNITY): Payer: Self-pay | Admitting: Emergency Medicine

## 2022-08-24 DIAGNOSIS — R630 Anorexia: Secondary | ICD-10-CM | POA: Diagnosis not present

## 2022-08-24 DIAGNOSIS — I739 Peripheral vascular disease, unspecified: Secondary | ICD-10-CM | POA: Diagnosis not present

## 2022-08-24 DIAGNOSIS — R634 Abnormal weight loss: Secondary | ICD-10-CM | POA: Diagnosis not present

## 2022-08-24 DIAGNOSIS — N1831 Chronic kidney disease, stage 3a: Secondary | ICD-10-CM | POA: Diagnosis not present

## 2022-08-24 DIAGNOSIS — J8410 Pulmonary fibrosis, unspecified: Secondary | ICD-10-CM | POA: Diagnosis not present

## 2022-08-24 DIAGNOSIS — I129 Hypertensive chronic kidney disease with stage 1 through stage 4 chronic kidney disease, or unspecified chronic kidney disease: Secondary | ICD-10-CM | POA: Diagnosis not present

## 2022-08-24 LAB — CBC
HCT: 35 % — ABNORMAL LOW (ref 36.0–46.0)
Hemoglobin: 11.7 g/dL — ABNORMAL LOW (ref 12.0–15.0)
MCH: 35.2 pg — ABNORMAL HIGH (ref 26.0–34.0)
MCHC: 33.4 g/dL (ref 30.0–36.0)
MCV: 105.4 fL — ABNORMAL HIGH (ref 80.0–100.0)
Platelets: 266 10*3/uL (ref 150–400)
RBC: 3.32 MIL/uL — ABNORMAL LOW (ref 3.87–5.11)
RDW: 14.2 % (ref 11.5–15.5)
WBC: 9.1 10*3/uL (ref 4.0–10.5)
nRBC: 0 % (ref 0.0–0.2)

## 2022-08-24 LAB — BASIC METABOLIC PANEL
Anion gap: 9 (ref 5–15)
BUN: 25 mg/dL — ABNORMAL HIGH (ref 8–23)
CO2: 21 mmol/L — ABNORMAL LOW (ref 22–32)
Calcium: 9.1 mg/dL (ref 8.9–10.3)
Chloride: 109 mmol/L (ref 98–111)
Creatinine, Ser: 1.03 mg/dL — ABNORMAL HIGH (ref 0.44–1.00)
GFR, Estimated: 50 mL/min — ABNORMAL LOW (ref >60)
Glucose, Bld: 103 mg/dL — ABNORMAL HIGH (ref 70–99)
Potassium: 4.6 mmol/L (ref 3.5–5.1)
Sodium: 139 mmol/L (ref 135–145)

## 2022-08-24 LAB — URINALYSIS, ROUTINE W REFLEX MICROSCOPIC
Bilirubin Urine: NEGATIVE
Glucose, UA: NEGATIVE mg/dL
Hgb urine dipstick: NEGATIVE
Ketones, ur: NEGATIVE mg/dL
Nitrite: NEGATIVE
Protein, ur: NEGATIVE mg/dL
Specific Gravity, Urine: 1.017 (ref 1.005–1.030)
pH: 5 (ref 5.0–8.0)

## 2022-08-24 MED ORDER — SULFAMETHOXAZOLE-TRIMETHOPRIM 800-160 MG PO TABS
1.0000 | ORAL_TABLET | Freq: Once | ORAL | Status: AC
  Administered 2022-08-24: 1 via ORAL
  Filled 2022-08-24: qty 1

## 2022-08-24 MED ORDER — SULFAMETHOXAZOLE-TRIMETHOPRIM 800-160 MG PO TABS: 1.0000 | ORAL_TABLET | Freq: Two times a day (BID) | ORAL | 0 refills | Status: AC

## 2022-08-24 MED ORDER — ONDANSETRON 4 MG PO TBDP
4.0000 mg | ORAL_TABLET | Freq: Once | ORAL | Status: AC
  Administered 2022-08-24: 4 mg via ORAL
  Filled 2022-08-24: qty 1

## 2022-08-24 NOTE — ED Notes (Signed)
Patient's caregiver, daughter, verbalizes understanding of discharge instructions. Opportunity for questioning and answers were provided. Armband removed by staff, pt discharged from ED. Pt taken to ED entrance via wheelchair.

## 2022-08-24 NOTE — ED Provider Notes (Signed)
[facility] Provider Note   CSN: [id number] Arrival date & time: 08/23/22  2238     History  Chief Complaint  Patient presents with   Fall   Level 5 caveat due to dementia Nicole Downs is a 89 y.o. female.  The history is provided by the patient and a relative. The history is limited by the condition of the patient.  Patient with history of dementia presents with fall.  Patient lives at home with her daughter.  She is currently on hospice and is a DNR.  Daughter reports that she heard the patient fall and hit the floor.  She had bruising on her forehead and bruising to her right shoulder.  Unknown LOC.  She is not anticoagulated.  No fevers or vomiting.  No diarrhea.  No recent cough.  No new medications. She typically uses a walker but has very poor balance at baseline.    Past Medical History:  Diagnosis Date   Actinic keratosis    Acute confusion    Acute encephalopathy 04/06/2021   Arthritis    Basal cell carcinoma 11/01/2015   L lat ankle   Basal cell carcinoma 12/13/2015   L dorsum foot   Basal cell carcinoma 09/12/2017   nasal root, R nasal bridge, L nasal bridge, R mandible infra auricular   Basal cell carcinoma 10/24/2017   R lateral foot   Basal cell carcinoma 12/05/2017   L dorsum foot   Basal cell carcinoma 11/27/2018   R of midline mid forehead   Basal cell carcinoma 06/25/2019   nasal bridge   Basal cell carcinoma 12/01/2019   Junction of right mandible and earlobe. Nodular pattern.   Basal cell carcinoma 04/19/2021   right inferior ear at ear lobe, EDC   Emphysema lung ([place])    Glaucoma    Hypertension    Hyponatremia    Hypothyroidism    Memory loss    Poor peripheral circulation    Squamous cell carcinoma in situ (SCCIS) 09/15/2021   right medial clavicle, EDC   Squamous cell carcinoma of skin 09/20/2015   R prox mandible/in situ   Squamous cell carcinoma of skin 02/01/2016   nose   Squamous  cell carcinoma of skin 10/24/2017   R lateral knee/in situ, R lat ankle/in situ   Squamous cell carcinoma of skin 12/05/2017   L medial calf/in situ   Squamous cell carcinoma of skin 12/01/2019   Right neck sup. SCCis, hypertrophic EDC, 03/30/21 LN2 for recurrence   Squamous cell carcinoma of skin 12/01/2019   Right neck inf.  SCCis, hypertrophic EDC, 03/30/21 LN2 fo recurrence   Squamous cell carcinoma of skin 03/02/2020   Left upper arm, proximal. SCCis   Squamous cell carcinoma of skin 03/02/2020   Left upper arm, distal. SCCis   Tremors of nervous system      Home Medications Prior to Admission medications   Medication Sig Start Date End Date Taking? Authorizing Provider  sulfamethoxazole-trimethoprim (BACTRIM DS) 800-160 MG tablet Take 1 tablet by mouth 2 (two) times daily for 5 days. 08/24/22 08/29/22 Yes Ripley Fraise, MD  Acetaminophen (TYLENOL PO) Take 1 tablet by mouth at bedtime.    [provider]  busPIRone (BUSPAR) 5 MG tablet TAKE 1 TABLET (5 MG TOTAL) BY MOUTH 2 (TWO) TIMES DAILY. AS NEEDED FOR ANXIETY. Patient taking differently: Take 5 mg by mouth 2 (two) times daily as needed (anxiety). 09/16/21   Pleas Koch, NP  Carboxymethylcellulose  Sodium (REFRESH TEARS OP) Place 1 drop into both eyes in the morning and at bedtime.    [provider]  estradiol (ESTRACE) 0.1 MG/GM vaginal cream PLACE 1 APPLICATORFUL VAGINALLY 3 (THREE) TIMES A WEEK. FOR UTI PREVENTION. Patient not taking: Reported on 04/20/2022 03/17/22   Pleas Koch, NP  levobunolol (BETAGAN) 0.5 % ophthalmic solution Place 1 drop into both eyes 2 (two) times daily.    [provider]  levothyroxine (SYNTHROID) 25 MCG tablet TAKE 1 TABLET BY MOUTH EVERY MORNING ON AN EMPTY STOMACH WITH WATER ONLY. NO FOOD OR OTHER MEDICATIONS FOR 30 MINUTES. 06/09/22   Pleas Koch, NP  memantine [facility]) 5 MG tablet Take 1 tablet by mouth every morning and 2 tablets by mouth every  evening for memory and behavior Patient not taking: Reported on 04/20/2022 11/28/21   Pleas Koch, NP  QUEtiapine (SEROQUEL) 25 MG tablet Take 25 mg by mouth at bedtime. Patient not taking: Reported on 04/20/2022 02/08/22   [provider]  sertraline (ZOLOFT) 25 MG tablet TAKE 1 TABLET BY MOUTH AT BEDTIME FOR DEPRESSION Patient not taking: Reported on 04/20/2022 05/11/21   Pleas Koch, NP  traZODone (DESYREL) 50 MG tablet Take 50 mg by mouth at bedtime. 03/21/22   [provider]      Allergies    Augmentin [amoxicillin-pot clavulanate] and Amlodipine    Review of Systems   Review of Systems  Unable to perform ROS: Dementia    Physical Exam Updated Vital Signs BP 129/81   Pulse 75   Temp 97.7 F (36.5 C)   Resp 12   SpO2 98%  Physical Exam CONSTITUTIONAL: Elderly and frail HEAD: Bruising to her forehead, no signs of trauma EYES: EOMI/PERRL ENMT: Mucous membranes moist NECK: supple no meningeal signs SPINE/BACK:entire spine nontender No bruising/crepitance/stepoffs noted to spine CV: S1/S2 noted, no murmurs/rubs/gallops noted LUNGS: Lungs are clear to auscultation bilaterally, no apparent distress ABDOMEN: soft, nontender NEURO: Pt is awake/alert appears pleasantly confused.  Moves all extremities x 4 EXTREMITIES: pulses normal/equal, full ROM Bruising to the right shoulder Full range of motion of both hips without difficulty All other extremities/joints palpated/ranged and nontender SKIN: warm, color normal   ED Results / Procedures / Treatments   Labs (all labs ordered are listed, but only abnormal results are displayed) Labs Reviewed  CBC - Abnormal; Notable for the following components:      Result Value   RBC 3.32 (*)    Hemoglobin 11.7 (*)    HCT 35.0 (*)    MCV 105.4 (*)    MCH 35.2 (*)    All other components within normal limits  BASIC METABOLIC PANEL - Abnormal; Notable for the following components:   CO2 21 (*)    Glucose,  Bld 103 (*)    BUN 25 (*)    Creatinine, Ser 1.03 (*)    GFR, Estimated 50 (*)    All other components within normal limits  URINALYSIS, ROUTINE W REFLEX MICROSCOPIC - Abnormal; Notable for the following components:   APPearance HAZY (*)    Leukocytes,Ua MODERATE (*)    Bacteria, UA RARE (*)    All other components within normal limits    EKG None  Radiology CT Head Wo Contrast  Result Date: 08/24/2022 CLINICAL DATA:  Fall, dementia EXAM: CT HEAD WITHOUT CONTRAST TECHNIQUE: Contiguous axial images were obtained from the base of the skull through the vertex without intravenous contrast. RADIATION DOSE REDUCTION: This exam was performed according  to the departmental dose-optimization program which includes automated exposure control, adjustment of the mA and/or kV according to patient size and/or use of iterative reconstruction technique. COMPARISON:  11/28/2021 FINDINGS: Motion degraded images. Brain: No evidence of acute infarction, hemorrhage, hydrocephalus, extra-axial collection or mass lesion/mass effect. Global cortical and central atrophy. Subcortical white matter and periventricular small vessel ischemic changes. Vascular: Intracranial atherosclerosis. Skull: Normal. Negative for fracture or focal lesion. Sinuses/Orbits: The visualized paranasal sinuses are essentially clear. The mastoid air cells are unopacified. Other: None. IMPRESSION: Motion degraded images. No evidence of acute intracranial abnormality. Atrophy with small vessel ischemic changes. Electronically Signed   By: Julian Hy M.D.   On: 08/24/2022 00:02   DG Shoulder Right Port  Result Date: 08/23/2022 CLINICAL DATA:  Recent fall with right shoulder pain, initial encounter EXAM: RIGHT SHOULDER - 1 VIEW COMPARISON:  None Available. FINDINGS: Degenerative changes of the acromioclavicular joint are seen. No acute fracture or dislocation is noted. No soft tissue abnormality is seen. Under lying bony thorax appears within  normal limits. IMPRESSION: Degenerative changes without acute abnormality. Electronically Signed   By: Inez Catalina M.D.   On: 08/23/2022 23:37    Procedures Procedures    Medications Ordered in ED Medications  ondansetron (ZOFRAN-ODT) disintegrating tablet 4 mg (4 mg Oral Given 08/24/22 0045)  sulfamethoxazole-trimethoprim (BACTRIM DS) 800-160 MG per tablet 1 tablet (1 tablet Oral Given 08/24/22 0124)    ED Course/ Medical Decision Making/ A&P Clinical Course as of 08/24/22 0145  Thu Aug 24, 2022  0144 Patient presents for likely mechanical fall from home.  Imaging is negative.  No other signs of acute traumatic injury.  She has been resting comfortably.  Daughter requested lab workup.  Overall labs are reassuring except for evidence of a UTI.  Daughter reports she has had resistance before, but has been treated with Bactrim with good response.  Short course of Bactrim is been ordered.  I advised daughter to call hospice tomorrow for recheck at home. [DW]    Clinical Course User Index [DW] Ripley Fraise, MD                             Medical Decision Making Amount and/or Complexity of Data Reviewed Labs: ordered. Radiology: ordered.  Risk Prescription drug management.   This patient presents to the ED for concern of fall with head injury, this involves an extensive number of treatment options, and is a complaint that carries with it a high risk of complications and morbidity.  The differential diagnosis includes but is not limited to subdural hematoma, skull fracture, concussion  Comorbidities that complicate the patient evaluation: Patient's presentation is complicated by their history of dementia  Social Determinants of Health: Patient's  poor mobility   increases the complexity of managing their presentation  Additional history obtained: Additional history obtained from family  Lab Tests: I Ordered, and personally interpreted labs.  The pertinent results include:  UTI  Imaging Studies ordered: I ordered imaging studies including CT scan head and X-ray right shoulder   I independently visualized and interpreted imaging which showed no acute findings I agree with the radiologist interpretation  Reevaluation: After the interventions noted above, I reevaluated the patient and found that they have :improved  Complexity of problems addressed: Patient's presentation is most consistent with  acute presentation with potential threat to life or bodily function  Disposition: After consideration of the diagnostic results and the patient's response to  treatment,  I feel that the patent would benefit from discharge   .           Final Clinical Impression(s) / ED Diagnoses Final diagnoses:  Fall, initial encounter  Concussion with unknown loss of consciousness status, initial encounter  Contusion of right shoulder, initial encounter  Acute cystitis without hematuria    Rx / DC Orders ED Discharge Orders          Ordered    sulfamethoxazole-trimethoprim (BACTRIM DS) 800-160 MG tablet  2 times daily        08/24/22 0122              Ripley Fraise, MD 08/24/22 [phone]

## 2022-08-25 DIAGNOSIS — I739 Peripheral vascular disease, unspecified: Secondary | ICD-10-CM | POA: Diagnosis not present

## 2022-08-25 DIAGNOSIS — J8410 Pulmonary fibrosis, unspecified: Secondary | ICD-10-CM | POA: Diagnosis not present

## 2022-08-25 DIAGNOSIS — I129 Hypertensive chronic kidney disease with stage 1 through stage 4 chronic kidney disease, or unspecified chronic kidney disease: Secondary | ICD-10-CM | POA: Diagnosis not present

## 2022-08-25 DIAGNOSIS — R634 Abnormal weight loss: Secondary | ICD-10-CM | POA: Diagnosis not present

## 2022-08-25 DIAGNOSIS — N1831 Chronic kidney disease, stage 3a: Secondary | ICD-10-CM | POA: Diagnosis not present

## 2022-08-25 DIAGNOSIS — R630 Anorexia: Secondary | ICD-10-CM | POA: Diagnosis not present

## 2022-08-26 DIAGNOSIS — R634 Abnormal weight loss: Secondary | ICD-10-CM | POA: Diagnosis not present

## 2022-08-26 DIAGNOSIS — I739 Peripheral vascular disease, unspecified: Secondary | ICD-10-CM | POA: Diagnosis not present

## 2022-08-26 DIAGNOSIS — I129 Hypertensive chronic kidney disease with stage 1 through stage 4 chronic kidney disease, or unspecified chronic kidney disease: Secondary | ICD-10-CM | POA: Diagnosis not present

## 2022-08-26 DIAGNOSIS — N1831 Chronic kidney disease, stage 3a: Secondary | ICD-10-CM | POA: Diagnosis not present

## 2022-08-26 DIAGNOSIS — R630 Anorexia: Secondary | ICD-10-CM | POA: Diagnosis not present

## 2022-08-26 DIAGNOSIS — J8410 Pulmonary fibrosis, unspecified: Secondary | ICD-10-CM | POA: Diagnosis not present

## 2022-08-27 DIAGNOSIS — N1831 Chronic kidney disease, stage 3a: Secondary | ICD-10-CM | POA: Diagnosis not present

## 2022-08-27 DIAGNOSIS — I129 Hypertensive chronic kidney disease with stage 1 through stage 4 chronic kidney disease, or unspecified chronic kidney disease: Secondary | ICD-10-CM | POA: Diagnosis not present

## 2022-08-27 DIAGNOSIS — R630 Anorexia: Secondary | ICD-10-CM | POA: Diagnosis not present

## 2022-08-27 DIAGNOSIS — J8410 Pulmonary fibrosis, unspecified: Secondary | ICD-10-CM | POA: Diagnosis not present

## 2022-08-27 DIAGNOSIS — I739 Peripheral vascular disease, unspecified: Secondary | ICD-10-CM | POA: Diagnosis not present

## 2022-08-27 DIAGNOSIS — R634 Abnormal weight loss: Secondary | ICD-10-CM | POA: Diagnosis not present

## 2022-08-28 DIAGNOSIS — N1831 Chronic kidney disease, stage 3a: Secondary | ICD-10-CM | POA: Diagnosis not present

## 2022-08-28 DIAGNOSIS — J8410 Pulmonary fibrosis, unspecified: Secondary | ICD-10-CM | POA: Diagnosis not present

## 2022-08-28 DIAGNOSIS — I739 Peripheral vascular disease, unspecified: Secondary | ICD-10-CM | POA: Diagnosis not present

## 2022-08-28 DIAGNOSIS — R630 Anorexia: Secondary | ICD-10-CM | POA: Diagnosis not present

## 2022-08-28 DIAGNOSIS — R634 Abnormal weight loss: Secondary | ICD-10-CM | POA: Diagnosis not present

## 2022-08-28 DIAGNOSIS — I129 Hypertensive chronic kidney disease with stage 1 through stage 4 chronic kidney disease, or unspecified chronic kidney disease: Secondary | ICD-10-CM | POA: Diagnosis not present

## 2022-08-29 DIAGNOSIS — I129 Hypertensive chronic kidney disease with stage 1 through stage 4 chronic kidney disease, or unspecified chronic kidney disease: Secondary | ICD-10-CM | POA: Diagnosis not present

## 2022-08-29 DIAGNOSIS — J8410 Pulmonary fibrosis, unspecified: Secondary | ICD-10-CM | POA: Diagnosis not present

## 2022-08-29 DIAGNOSIS — R634 Abnormal weight loss: Secondary | ICD-10-CM | POA: Diagnosis not present

## 2022-08-29 DIAGNOSIS — N1831 Chronic kidney disease, stage 3a: Secondary | ICD-10-CM | POA: Diagnosis not present

## 2022-08-29 DIAGNOSIS — R630 Anorexia: Secondary | ICD-10-CM | POA: Diagnosis not present

## 2022-08-29 DIAGNOSIS — I739 Peripheral vascular disease, unspecified: Secondary | ICD-10-CM | POA: Diagnosis not present

## 2022-08-30 DIAGNOSIS — R630 Anorexia: Secondary | ICD-10-CM | POA: Diagnosis not present

## 2022-08-30 DIAGNOSIS — J8410 Pulmonary fibrosis, unspecified: Secondary | ICD-10-CM | POA: Diagnosis not present

## 2022-08-30 DIAGNOSIS — N1831 Chronic kidney disease, stage 3a: Secondary | ICD-10-CM | POA: Diagnosis not present

## 2022-08-30 DIAGNOSIS — I129 Hypertensive chronic kidney disease with stage 1 through stage 4 chronic kidney disease, or unspecified chronic kidney disease: Secondary | ICD-10-CM | POA: Diagnosis not present

## 2022-08-30 DIAGNOSIS — R634 Abnormal weight loss: Secondary | ICD-10-CM | POA: Diagnosis not present

## 2022-08-30 DIAGNOSIS — I739 Peripheral vascular disease, unspecified: Secondary | ICD-10-CM | POA: Diagnosis not present

## 2022-08-31 DIAGNOSIS — N1831 Chronic kidney disease, stage 3a: Secondary | ICD-10-CM | POA: Diagnosis not present

## 2022-08-31 DIAGNOSIS — R630 Anorexia: Secondary | ICD-10-CM | POA: Diagnosis not present

## 2022-08-31 DIAGNOSIS — I129 Hypertensive chronic kidney disease with stage 1 through stage 4 chronic kidney disease, or unspecified chronic kidney disease: Secondary | ICD-10-CM | POA: Diagnosis not present

## 2022-08-31 DIAGNOSIS — J8410 Pulmonary fibrosis, unspecified: Secondary | ICD-10-CM | POA: Diagnosis not present

## 2022-08-31 DIAGNOSIS — R634 Abnormal weight loss: Secondary | ICD-10-CM | POA: Diagnosis not present

## 2022-08-31 DIAGNOSIS — I739 Peripheral vascular disease, unspecified: Secondary | ICD-10-CM | POA: Diagnosis not present

## 2022-09-02 DIAGNOSIS — R630 Anorexia: Secondary | ICD-10-CM | POA: Diagnosis not present

## 2022-09-02 DIAGNOSIS — N1831 Chronic kidney disease, stage 3a: Secondary | ICD-10-CM | POA: Diagnosis not present

## 2022-09-02 DIAGNOSIS — J8410 Pulmonary fibrosis, unspecified: Secondary | ICD-10-CM | POA: Diagnosis not present

## 2022-09-02 DIAGNOSIS — I739 Peripheral vascular disease, unspecified: Secondary | ICD-10-CM | POA: Diagnosis not present

## 2022-09-02 DIAGNOSIS — R634 Abnormal weight loss: Secondary | ICD-10-CM | POA: Diagnosis not present

## 2022-09-02 DIAGNOSIS — I129 Hypertensive chronic kidney disease with stage 1 through stage 4 chronic kidney disease, or unspecified chronic kidney disease: Secondary | ICD-10-CM | POA: Diagnosis not present

## 2022-09-03 DIAGNOSIS — R634 Abnormal weight loss: Secondary | ICD-10-CM | POA: Diagnosis not present

## 2022-09-03 DIAGNOSIS — J8410 Pulmonary fibrosis, unspecified: Secondary | ICD-10-CM | POA: Diagnosis not present

## 2022-09-03 DIAGNOSIS — I739 Peripheral vascular disease, unspecified: Secondary | ICD-10-CM | POA: Diagnosis not present

## 2022-09-03 DIAGNOSIS — N1831 Chronic kidney disease, stage 3a: Secondary | ICD-10-CM | POA: Diagnosis not present

## 2022-09-03 DIAGNOSIS — I129 Hypertensive chronic kidney disease with stage 1 through stage 4 chronic kidney disease, or unspecified chronic kidney disease: Secondary | ICD-10-CM | POA: Diagnosis not present

## 2022-09-03 DIAGNOSIS — R630 Anorexia: Secondary | ICD-10-CM | POA: Diagnosis not present

## 2022-09-04 DIAGNOSIS — I129 Hypertensive chronic kidney disease with stage 1 through stage 4 chronic kidney disease, or unspecified chronic kidney disease: Secondary | ICD-10-CM | POA: Diagnosis not present

## 2022-09-04 DIAGNOSIS — R630 Anorexia: Secondary | ICD-10-CM | POA: Diagnosis not present

## 2022-09-04 DIAGNOSIS — J8410 Pulmonary fibrosis, unspecified: Secondary | ICD-10-CM | POA: Diagnosis not present

## 2022-09-04 DIAGNOSIS — I739 Peripheral vascular disease, unspecified: Secondary | ICD-10-CM | POA: Diagnosis not present

## 2022-09-04 DIAGNOSIS — N1831 Chronic kidney disease, stage 3a: Secondary | ICD-10-CM | POA: Diagnosis not present

## 2022-09-04 DIAGNOSIS — R634 Abnormal weight loss: Secondary | ICD-10-CM | POA: Diagnosis not present

## 2022-09-05 DIAGNOSIS — J8410 Pulmonary fibrosis, unspecified: Secondary | ICD-10-CM | POA: Diagnosis not present

## 2022-09-05 DIAGNOSIS — R634 Abnormal weight loss: Secondary | ICD-10-CM | POA: Diagnosis not present

## 2022-09-05 DIAGNOSIS — N1831 Chronic kidney disease, stage 3a: Secondary | ICD-10-CM | POA: Diagnosis not present

## 2022-09-05 DIAGNOSIS — I129 Hypertensive chronic kidney disease with stage 1 through stage 4 chronic kidney disease, or unspecified chronic kidney disease: Secondary | ICD-10-CM | POA: Diagnosis not present

## 2022-09-05 DIAGNOSIS — R630 Anorexia: Secondary | ICD-10-CM | POA: Diagnosis not present

## 2022-09-05 DIAGNOSIS — I739 Peripheral vascular disease, unspecified: Secondary | ICD-10-CM | POA: Diagnosis not present

## 2022-09-06 DIAGNOSIS — N1831 Chronic kidney disease, stage 3a: Secondary | ICD-10-CM | POA: Diagnosis not present

## 2022-09-06 DIAGNOSIS — R634 Abnormal weight loss: Secondary | ICD-10-CM | POA: Diagnosis not present

## 2022-09-06 DIAGNOSIS — I739 Peripheral vascular disease, unspecified: Secondary | ICD-10-CM | POA: Diagnosis not present

## 2022-09-06 DIAGNOSIS — R630 Anorexia: Secondary | ICD-10-CM | POA: Diagnosis not present

## 2022-09-06 DIAGNOSIS — I129 Hypertensive chronic kidney disease with stage 1 through stage 4 chronic kidney disease, or unspecified chronic kidney disease: Secondary | ICD-10-CM | POA: Diagnosis not present

## 2022-09-06 DIAGNOSIS — J8410 Pulmonary fibrosis, unspecified: Secondary | ICD-10-CM | POA: Diagnosis not present

## 2022-09-07 DIAGNOSIS — J8410 Pulmonary fibrosis, unspecified: Secondary | ICD-10-CM | POA: Diagnosis not present

## 2022-09-07 DIAGNOSIS — I739 Peripheral vascular disease, unspecified: Secondary | ICD-10-CM | POA: Diagnosis not present

## 2022-09-07 DIAGNOSIS — I129 Hypertensive chronic kidney disease with stage 1 through stage 4 chronic kidney disease, or unspecified chronic kidney disease: Secondary | ICD-10-CM | POA: Diagnosis not present

## 2022-09-07 DIAGNOSIS — N1831 Chronic kidney disease, stage 3a: Secondary | ICD-10-CM | POA: Diagnosis not present

## 2022-09-07 DIAGNOSIS — R630 Anorexia: Secondary | ICD-10-CM | POA: Diagnosis not present

## 2022-09-07 DIAGNOSIS — R634 Abnormal weight loss: Secondary | ICD-10-CM | POA: Diagnosis not present

## 2022-09-18 DEATH — deceased

## 2022-12-14 IMAGING — CT CT HEAD W/O CM
4 series · 17 of 47 positions shown, 19 images · non-contrast
Comparison: None.

CLINICAL DATA: Mental status change, unknown cause

EXAM:
CT HEAD WITHOUT CONTRAST
TECHNIQUE: Contiguous axial images were obtained from the base of the skull
through the vertex without intravenous contrast.

[Series 2: head wo · axial · 0.42mm/px · z∈[-138,-12]mm · 7 of 35 slices shown, 9 images]
[im 5/35  brain]
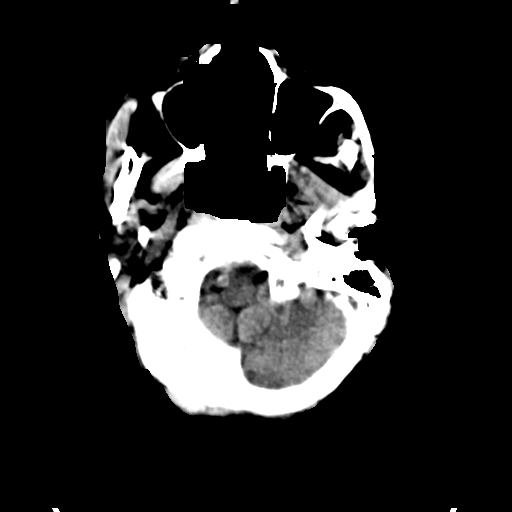
[im 5/35  bone]
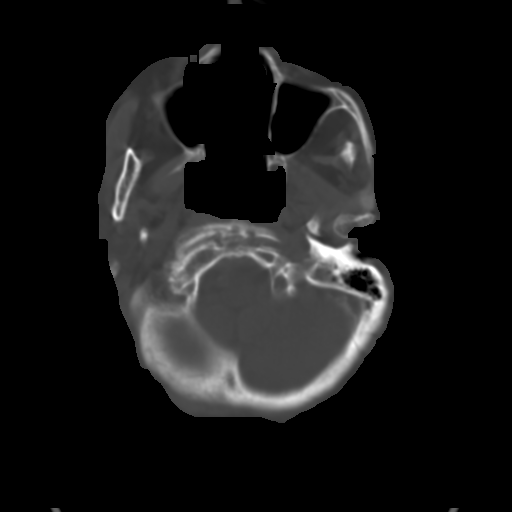
[im 9/35  brain]
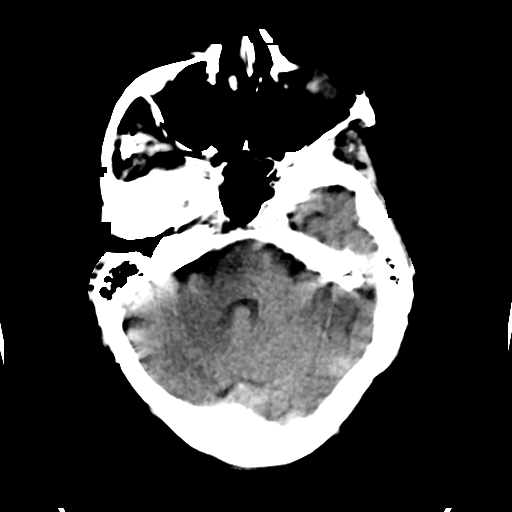
[im 13/35  brain]
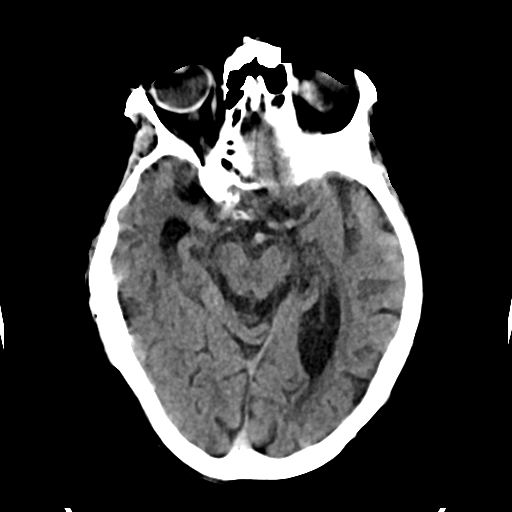
[im 18/35  brain]
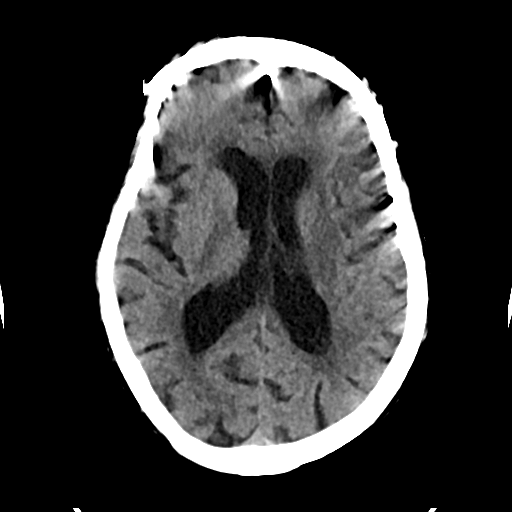
[im 22/35  brain]
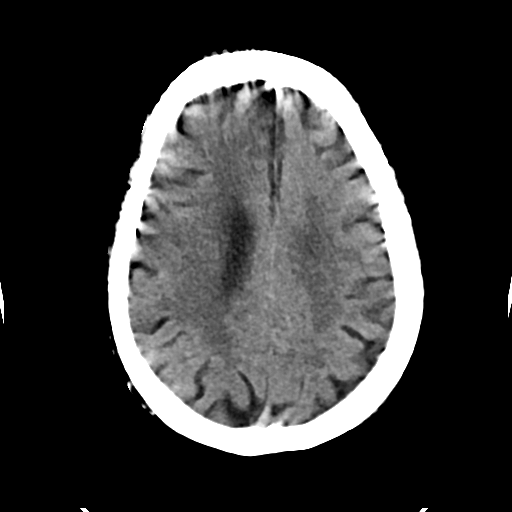
[im 22/35  bone]
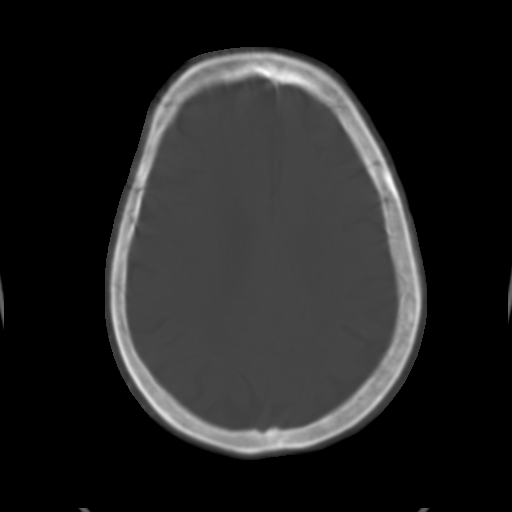
[im 26/35  brain]
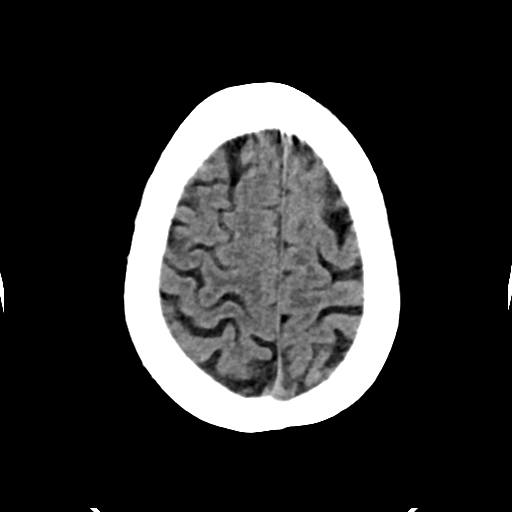
[im 30/35  brain]
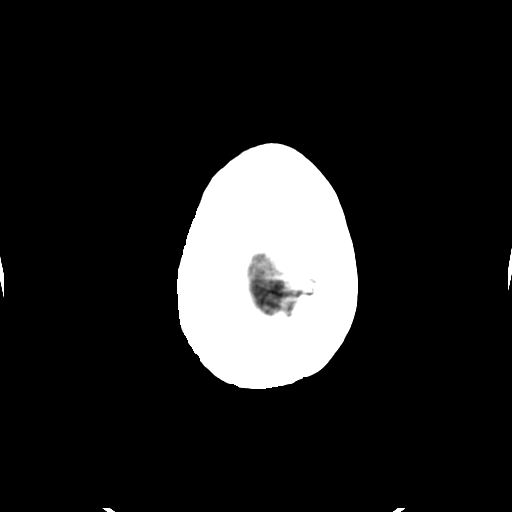

[Series 3: head bone · axial · 0.42mm/px · z∈[-142,-82]mm · 4 of 86 slices shown]
[im 9/86  bone]
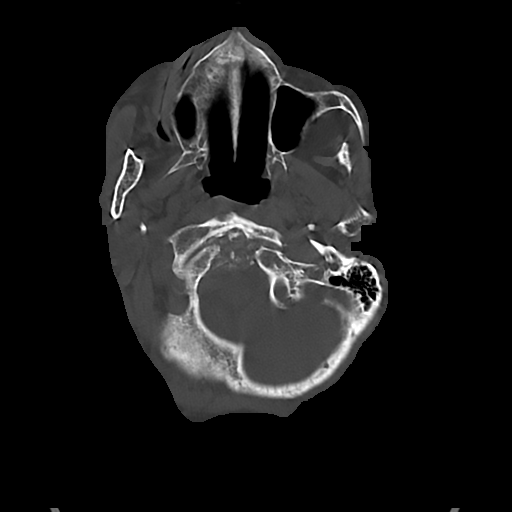
[im 18/86  bone]
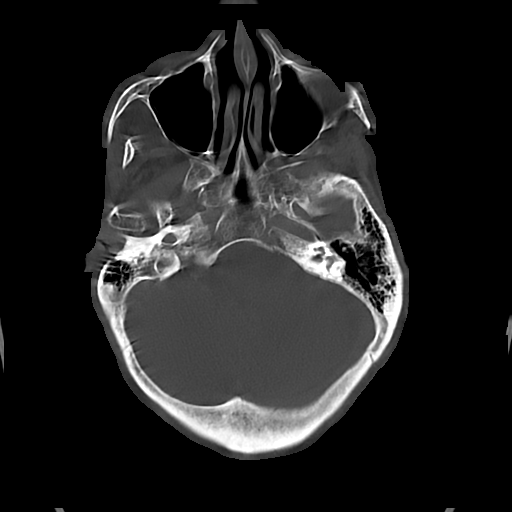
[im 26/86  bone]
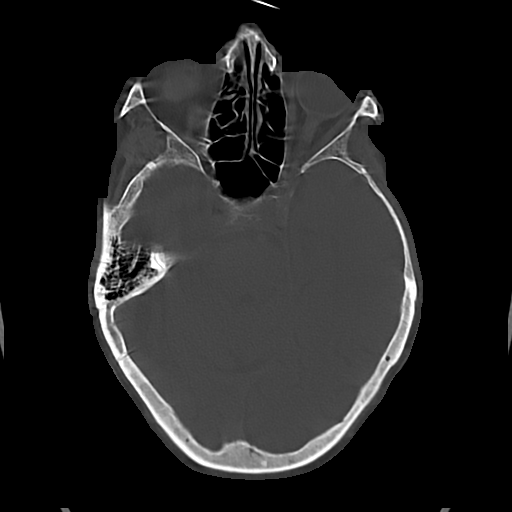
[im 39/86  bone]
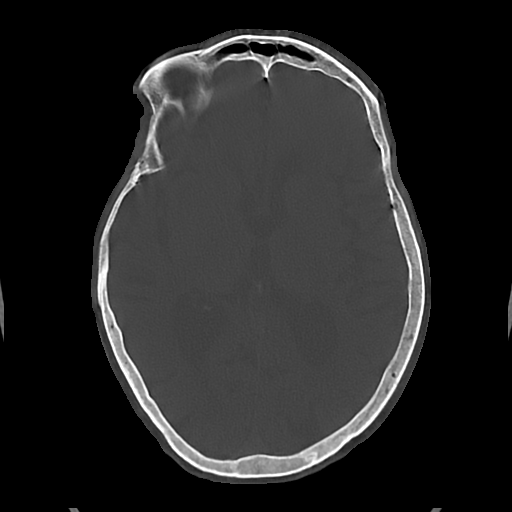

[Series 4: cor soft · coronal · 0.31mm/px · 3 of 67 slices shown]
[im 23/67  brain]
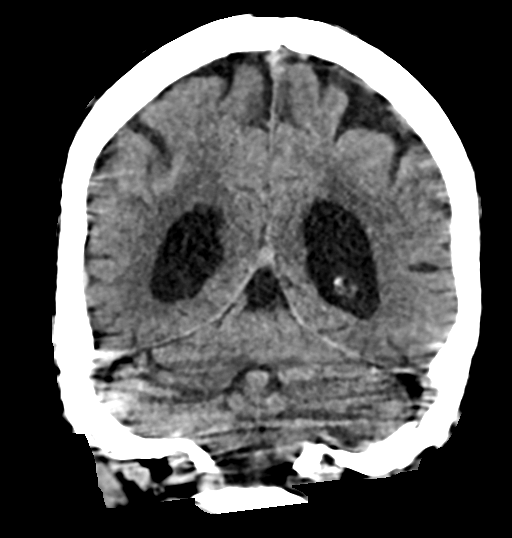
[im 30/67  brain]
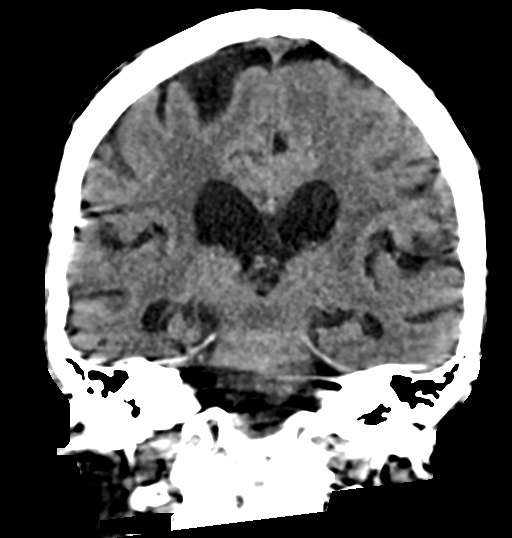
[im 37/67  brain]
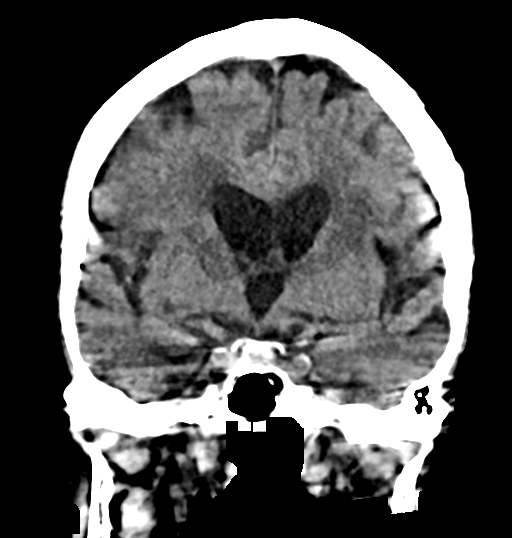

[Series 5: sag soft · sagittal · 0.33mm/px · 3 of 54 slices shown]
[im 18/54  brain]
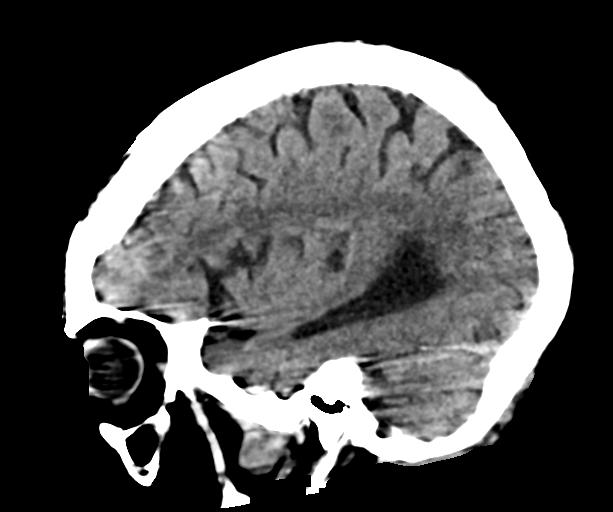
[im 27/54  brain]
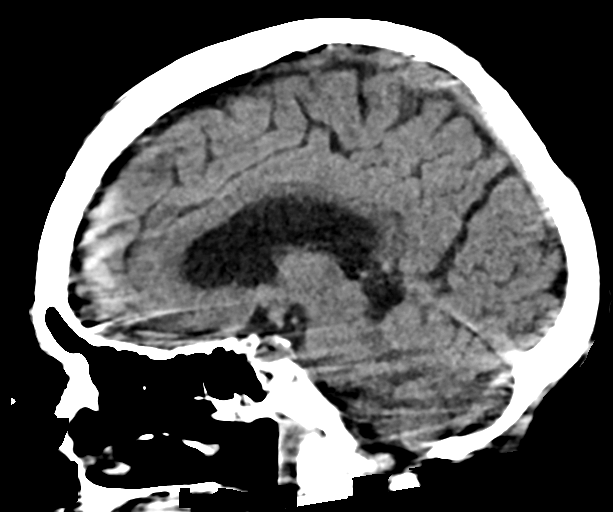
[im 36/54  brain]
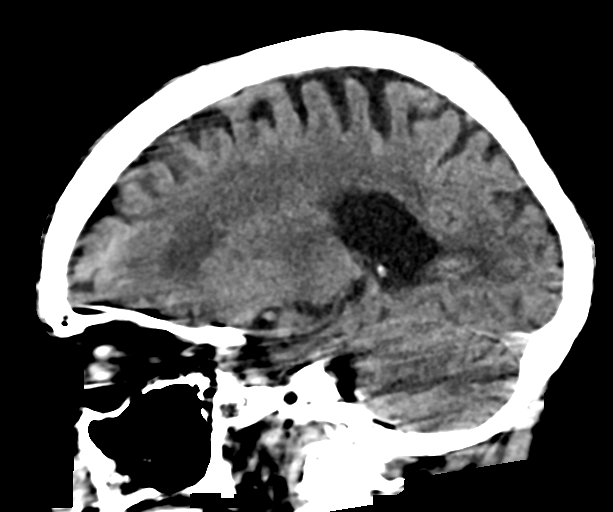

[17 of 47 positions shown; findings below may reference images not displayed]

FINDINGS: Evaluation is limited secondary to motion and patient inability to
cooperate with exam

Brain: No definitive evidence of acute infarction, large volume
hemorrhage, hydrocephalus, extra-axial collection or mass
lesion/mass effect. Global parenchymal volume loss. Periventricular
white matter hypodensities consistent with sequela of chronic
microvascular ischemic disease.

Vascular: Scattered vascular calcifications.

Skull: No definitive acute fracture.

Sinuses/Orbits: No acute finding.

Other: None.
IMPRESSION: Evaluation is limited secondary to motion and patient inability to
cooperate with exam. Within these limitations, no acute intracranial
abnormality. Evaluation for small volume hemorrhage is limited due
to motion and corresponding artifact.

## 2023-09-01 IMAGING — CT CT HEAD W/O CM
5 of 8 series · 17 of 47 positions shown, 18 images · non-contrast
Comparison: April 06, 2021.

CLINICAL DATA: Neck injury.



[Series 3: head without · axial · non-contrast · 0.42mm/px · z∈[-40,+15]mm · 2 of 35 slices shown, 3 images (1 of 2)]
[im 12/35  brain]
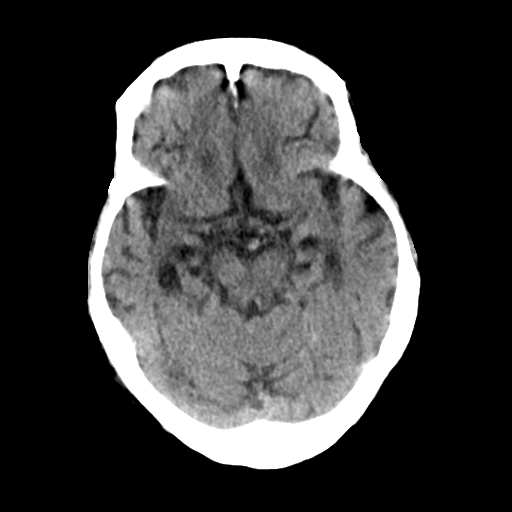
[im 12/35  bone]
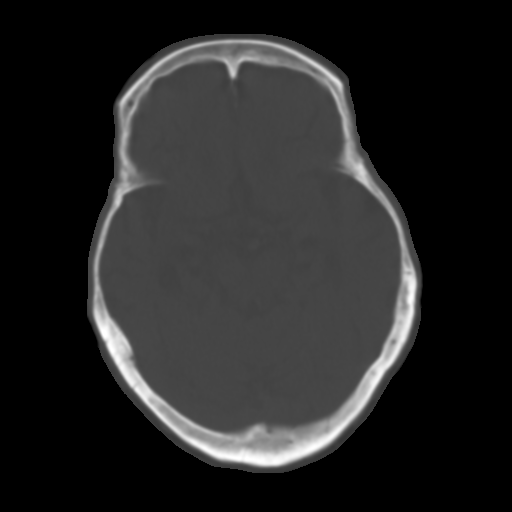
[im 23/35  brain]
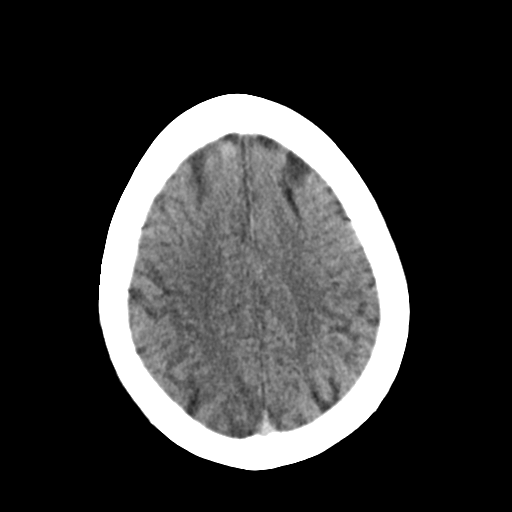

[Series 4: head without · axial · non-contrast · 0.42mm/px · z∈[-40,+15]mm · 2 of 35 slices shown (2 of 2)]
[im 12/35  brain]
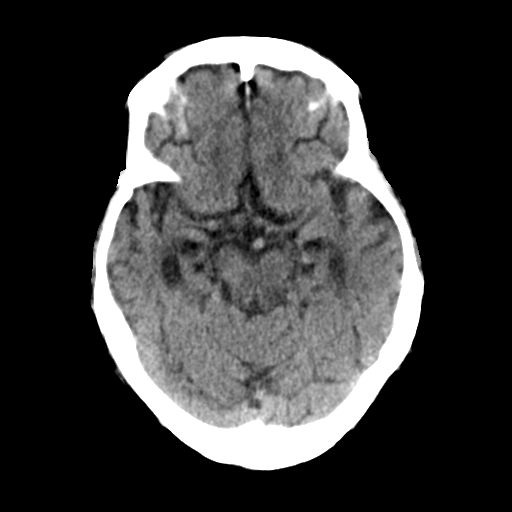
[im 23/35  brain]
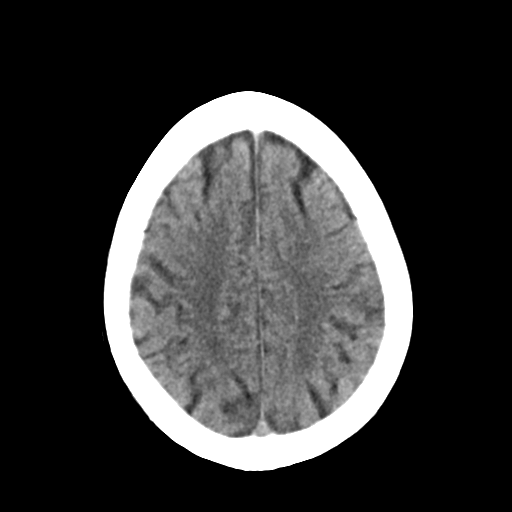

[Series 5: head bone · axial · 0.42mm/px · z∈[-79,+57]mm · 8 of 86 slices shown]
[im 9/86  bone]
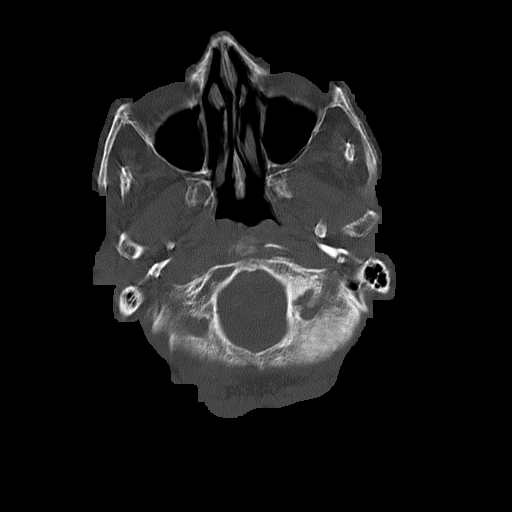
[im 18/86  bone]
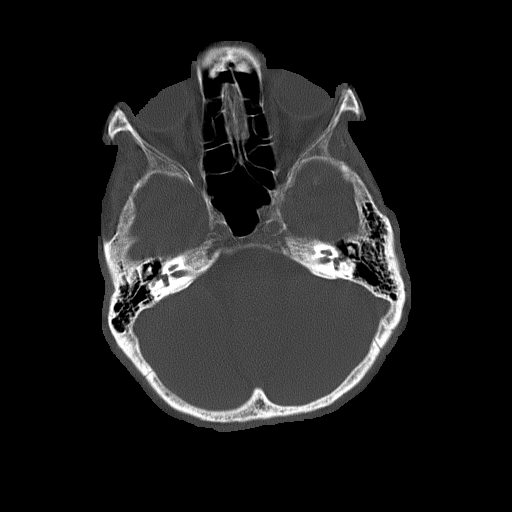
[im 26/86  bone]
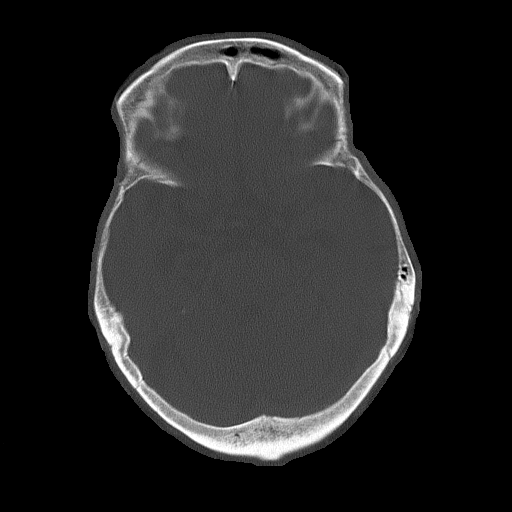
[im 35/86  bone]
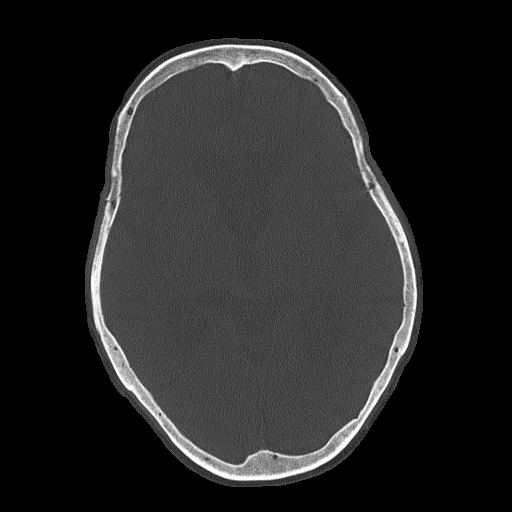
[im 52/86  bone]
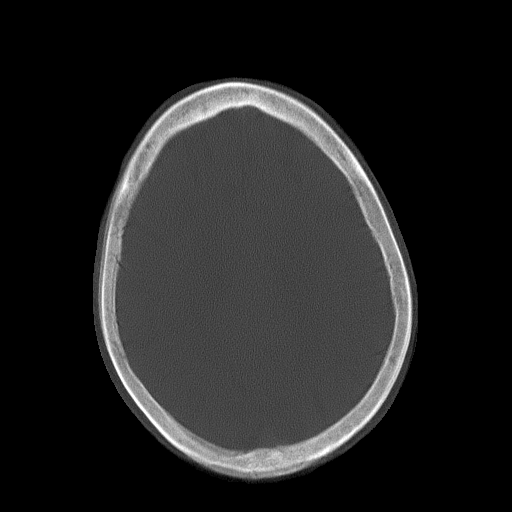
[im 60/86  bone]
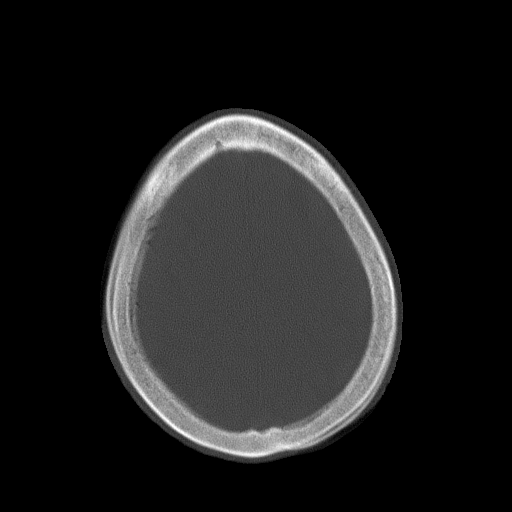
[im 69/86  bone]
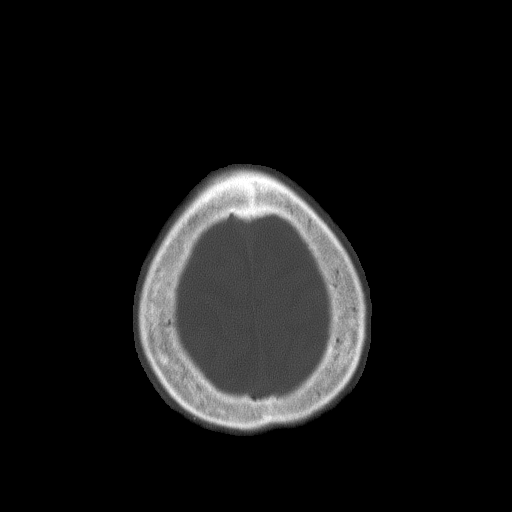
[im 77/86  bone]
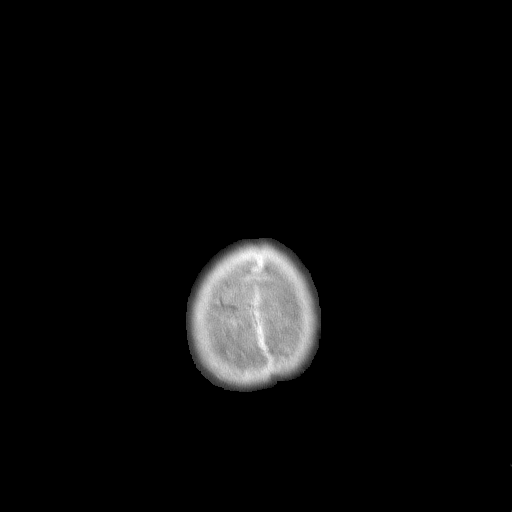

[Series 7: head without cor · coronal · non-contrast · 0.32mm/px · 3 of 67 slices shown]
[im 17/67  brain]
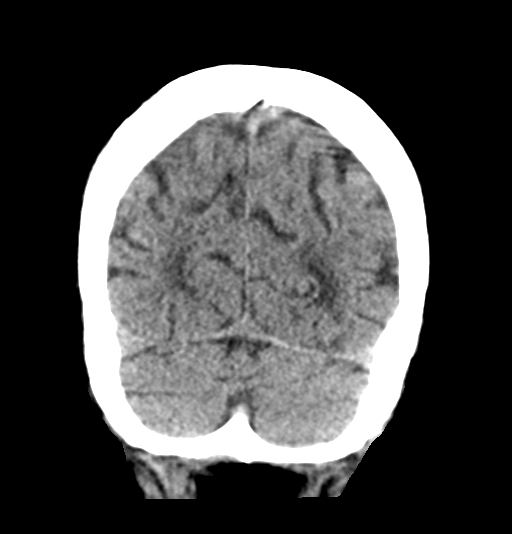
[im 34/67  brain]
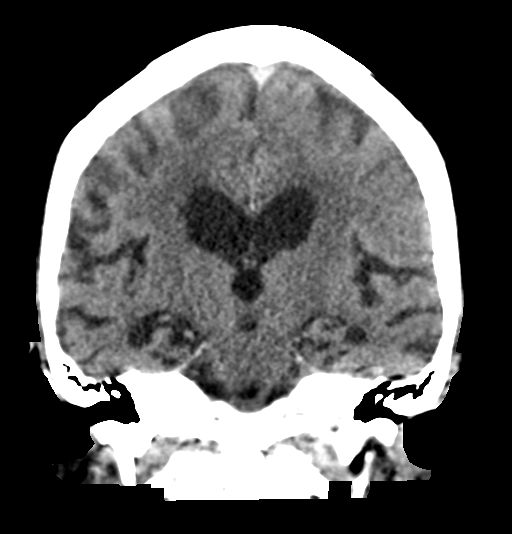
[im 50/67  brain]
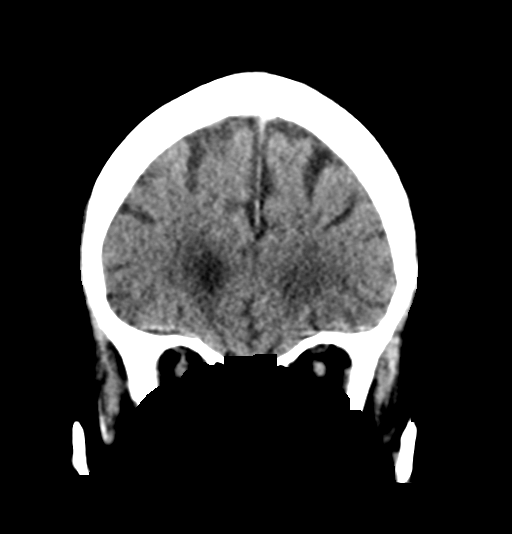

[Series 10: head without sag · sagittal · non-contrast · 0.36mm/px · 2 of 53 slices shown]
[im 18/53  brain]
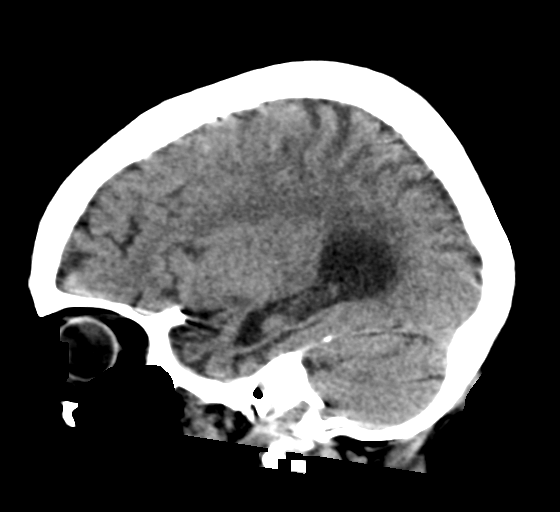
[im 35/53  brain]
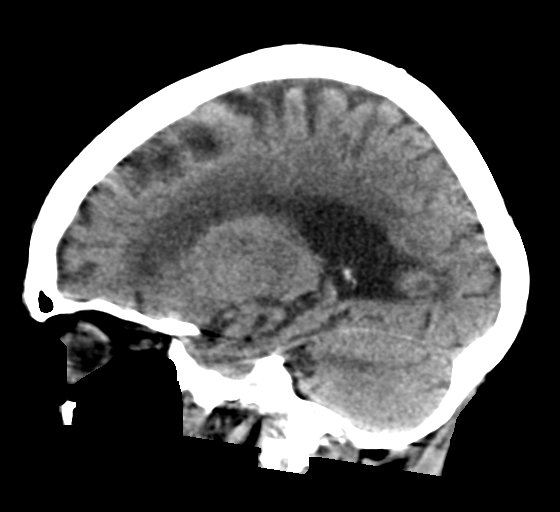

[17 of 47 positions shown; findings below may reference images not displayed]

FINDINGS: CT HEAD FINDINGS

Brain: Mild chronic ischemic white matter disease is noted. No mass
effect or midline shift is noted. Ventricular size is within normal
limits. There is no evidence of mass lesion, hemorrhage or acute
infarction.

Vascular: No hyperdense vessel or unexpected calcification.

Skull: Normal. Negative for fracture or focal lesion.

Sinuses/Orbits: No acute finding.

Other: None.

CT CERVICAL SPINE FINDINGS

Alignment: Grade 1 anterolisthesis of C3-4 and C4-5 and C7-T1 is
noted secondary to posterior facet joint hypertrophy. Mild grade 1
retrolisthesis of C5-6 is noted secondary to severe degenerative
disc disease at this level.

Skull base and vertebrae: No acute fracture. No primary bone lesion
or focal pathologic process.

Soft tissues and spinal canal: No prevertebral fluid or swelling. No
visible canal hematoma.

Disc levels: Severe degenerative disc disease is noted at C5-6 and
C6-7.

Upper chest: No acute abnormality is noted.

Other: None.
IMPRESSION: No acute intracranial abnormality seen.

Severe multilevel degenerative disc disease is noted in the cervical
spine. No acute abnormality is noted.

## 2023-09-01 IMAGING — CT CT CHEST-ABD-PELV W/O CM
2 of 5 series · 12 of 46 positions shown, 14 images · non-contrast
Comparison: 05/01/2018

CLINICAL DATA: Pneumonia, poor intake, somnolence



[Series 5: cap w/o 2.0 mm st · axial · non-contrast · 0.81mm/px · z∈[+832,+1350]mm · 9 of 297 slices shown, 11 images]
[im 19/297  soft-tissue]
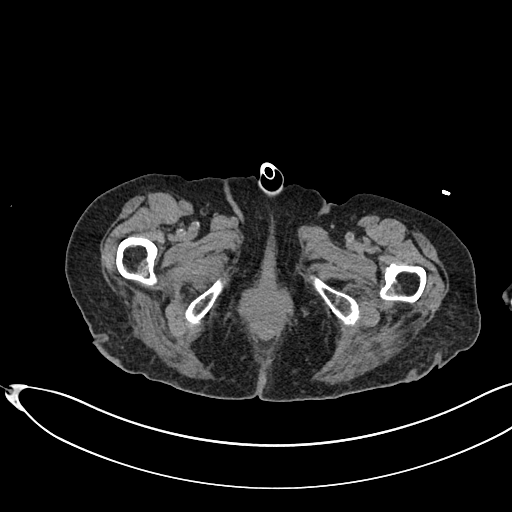
[im 19/297  bone]
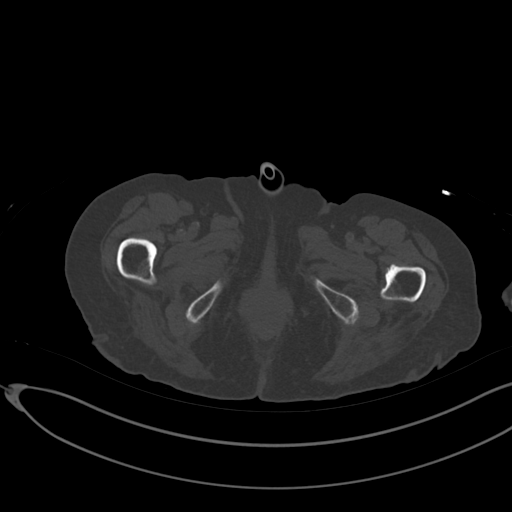
[im 56/297  soft-tissue]
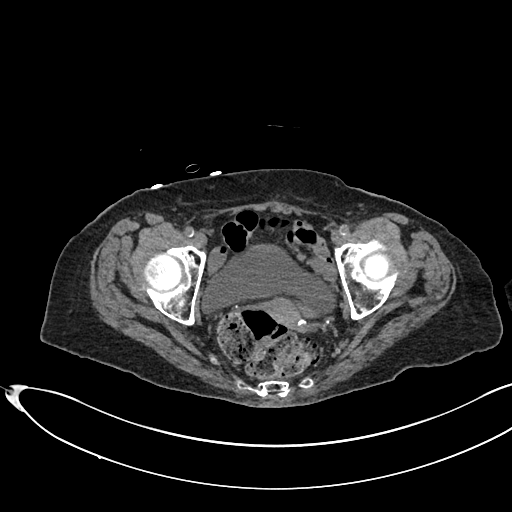
[im 93/297  soft-tissue]
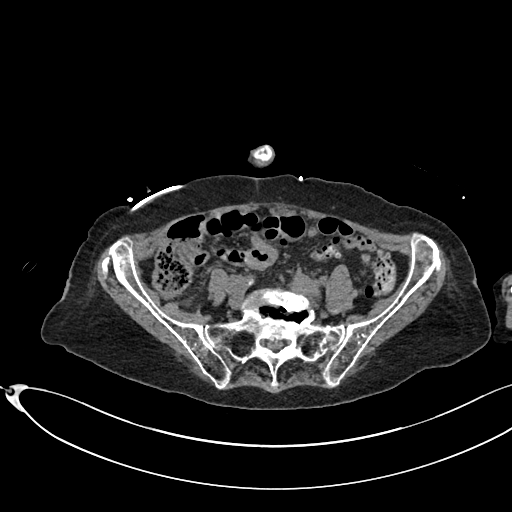
[im 112/297  soft-tissue]
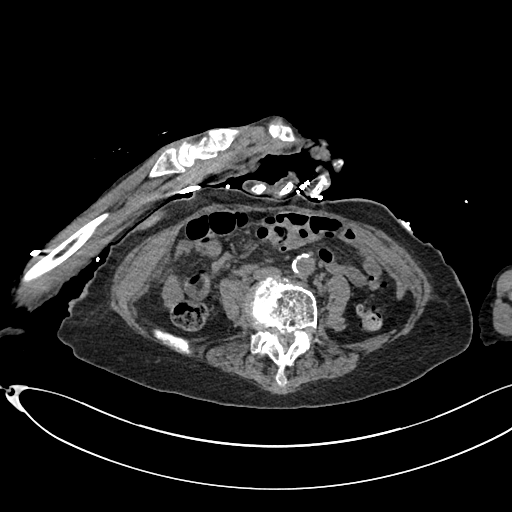
[im 149/297  soft-tissue]
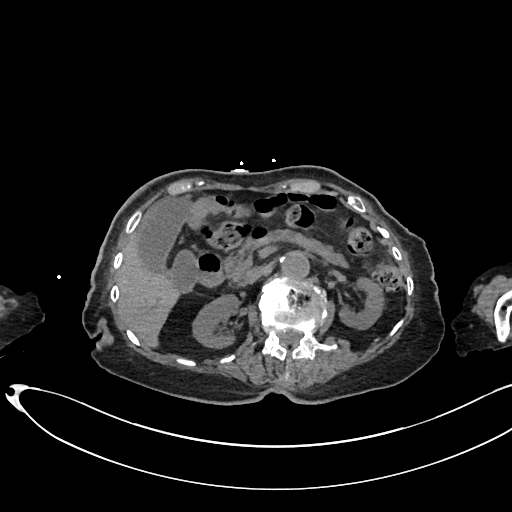
[im 186/297  soft-tissue]
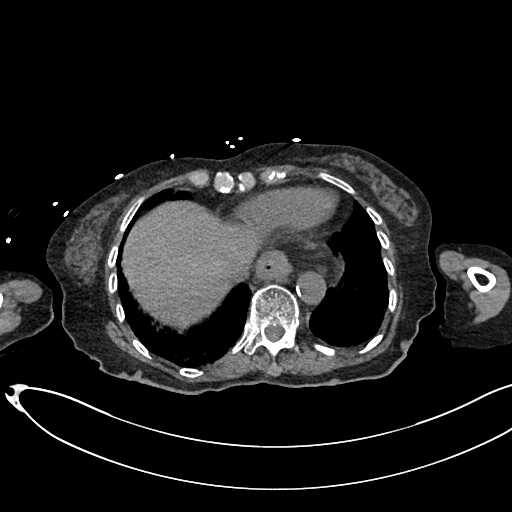
[im 204/297  soft-tissue]
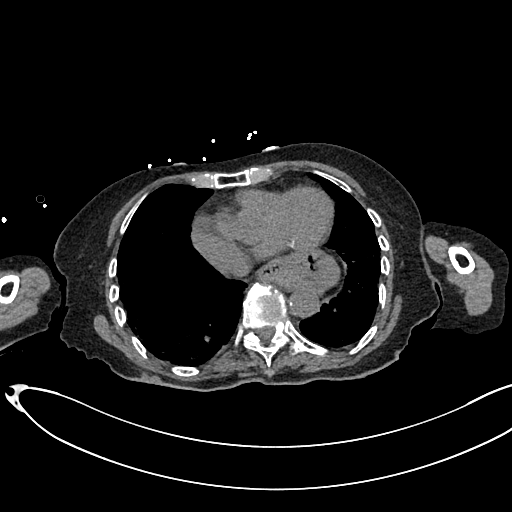
[im 241/297  soft-tissue]
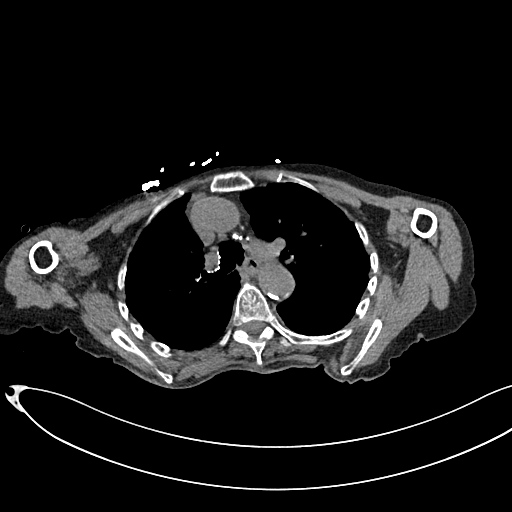
[im 278/297  soft-tissue]
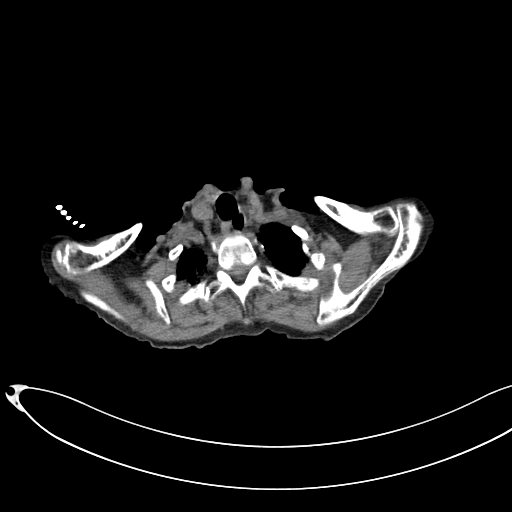
[im 278/297  bone]
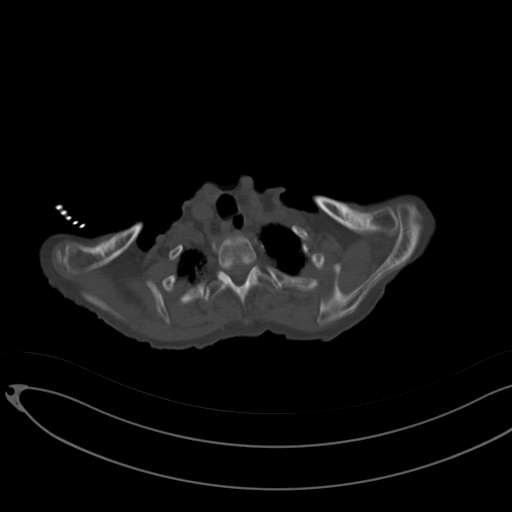

[Series 7: cap w/o 3.0 mm st cor · coronal · non-contrast · 0.61mm/px · 3 of 75 slices shown]
[im 25/75  soft-tissue]
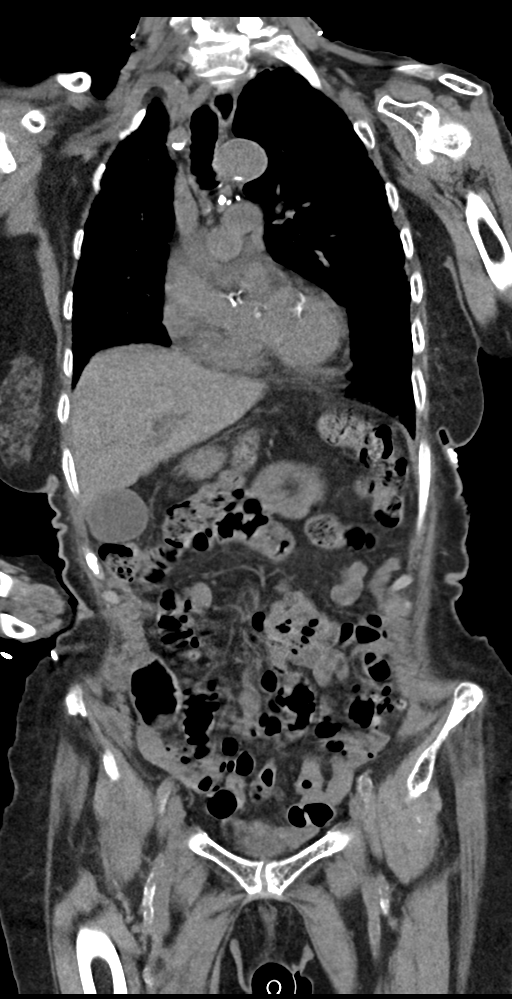
[im 33/75  soft-tissue]
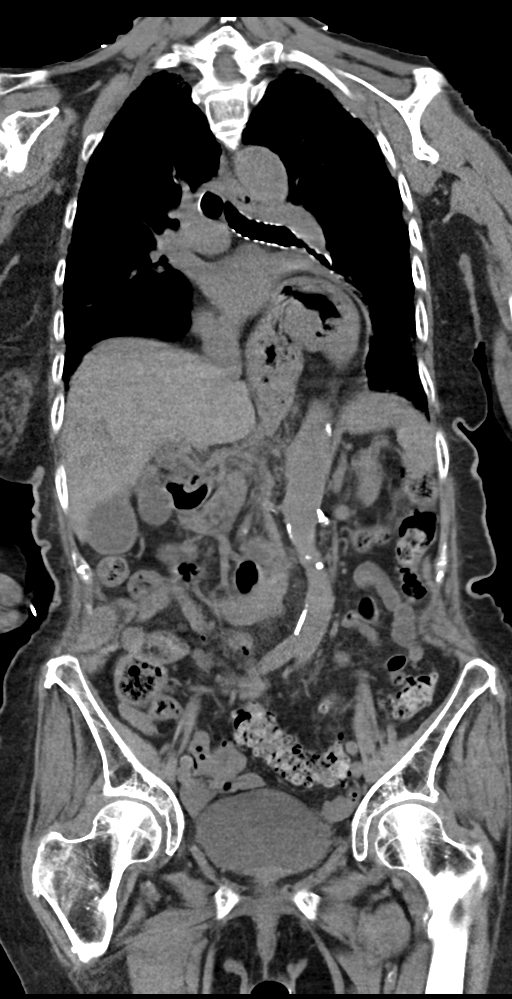
[im 42/75  soft-tissue]
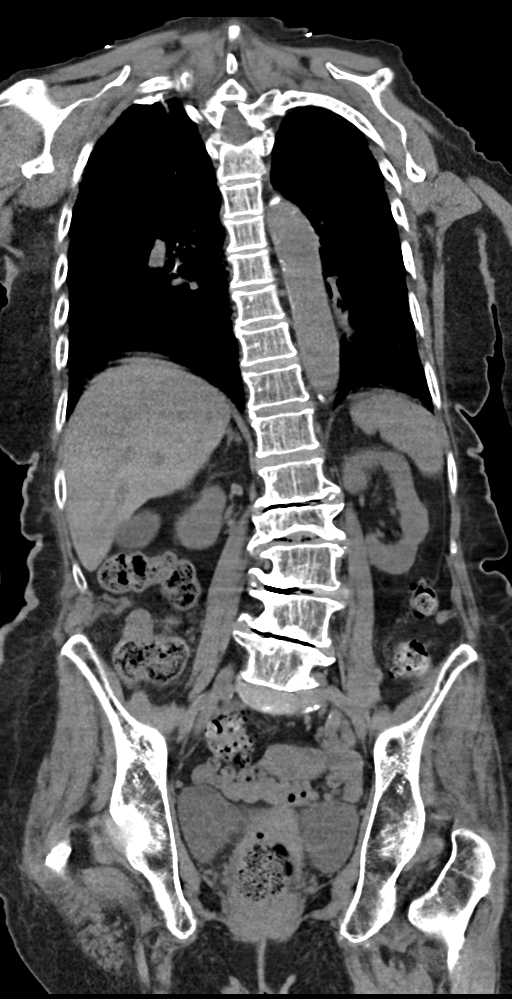

[12 of 46 positions shown; findings below may reference images not displayed]

FINDINGS: CT CHEST FINDINGS

Cardiovascular: Aortic atherosclerosis. Aortic valve calcifications.
Mild cardiomegaly. Three-vessel coronary artery calcifications. No
pericardial effusion.

Mediastinum/Nodes: No enlarged mediastinal, hilar, or axillary lymph
nodes. Large hiatal hernia with intrathoracic position of the
gastric body and fundus. Thyroid gland, trachea, and esophagus
demonstrate no significant findings.

Lungs/Pleura: Examination of the lungs is limited by extensive
breath motion artifact. Within this limitation, there is moderate
pulmonary fibrosis in a pattern with apical to basal gradient,
featuring irregular peripheral interstitial opacity and septal
thickening, likely with areas of subpleural bronchiolectasis at the
lung bases although poorly evaluated. No pleural effusion or
pneumothorax.

Musculoskeletal: No chest wall mass or suspicious osseous lesions
identified.

CT ABDOMEN PELVIS FINDINGS

Hepatobiliary: No solid liver abnormality is seen. Distended
gallbladder, similar in appearance to prior examination (series 3,
image 63). No gallstones, gallbladder wall thickening, or biliary
dilatation.

Pancreas: Unremarkable. No pancreatic ductal dilatation or
surrounding inflammatory changes.

Spleen: Normal in size without significant abnormality.

Adrenals/Urinary Tract: Adrenal glands are unremarkable. Kidneys are
normal, without renal calculi, solid lesion, or hydronephrosis.
Bladder is unremarkable.

Stomach/Bowel: Stomach is within normal limits. Appendix is not
clearly visualized. No evidence of bowel wall thickening,
distention, or inflammatory changes. Descending and sigmoid
diverticulosis. Moderate burden of stool throughout the colon and
rectum, with stool balls in the rectum measuring up to 6.3 cm.

Vascular/Lymphatic: Aortic atherosclerosis. No enlarged abdominal or
pelvic lymph nodes.

Reproductive: No mass or other abnormality.

Other: No abdominal wall hernia or abnormality. No ascites.

Musculoskeletal: No acute osseous findings. Gentle levoscoliosis of
the thoracolumbar spine.
IMPRESSION: 1. No definite acute noncontrast CT findings of the chest, abdomen,
or pelvis.
2. Distended gallbladder, similar in appearance to prior examination
dated 7552, without calculi, gallbladder wall thickening,
pericholecystic fluid, or biliary ductal dilatation.
3. Examination of the lungs is limited by extensive breath motion
artifact. Within this limitation, there is moderate pulmonary
fibrosis in a pattern with apical to basal gradient, featuring
irregular peripheral interstitial opacity and septal thickening,
likely with areas of subpleural bronchiolectasis at the lung bases
although poorly evaluated. Findings are most consistent with a
probable UIP pattern of fibrosis by ATS criteria.
4. Large hiatal hernia with intrathoracic position of the gastric
body and fundus.
5. Moderate burden of stool throughout the colon and rectum, with
stool balls in the rectum. Correlate for fecal impaction.
6. Coronary artery disease.
7. Aortic valve calcifications. Correlate for echocardiographic
evidence of aortic valve dysfunction.

Aortic Atherosclerosis ([id number]).
